# Patient Record
Sex: Male | Born: 1965 | Race: Black or African American | Hispanic: No | Marital: Single | Smoking: Current every day smoker
Health system: Southern US, Community
[De-identification: ages and names within clinical notes are randomized; demographics above are authoritative.]

## PROBLEM LIST (undated history)

## (undated) DIAGNOSIS — K7460 Unspecified cirrhosis of liver: Secondary | ICD-10-CM

## (undated) DIAGNOSIS — R569 Unspecified convulsions: Secondary | ICD-10-CM

## (undated) DIAGNOSIS — R066 Hiccough: Secondary | ICD-10-CM

## (undated) DIAGNOSIS — F1010 Alcohol abuse, uncomplicated: Secondary | ICD-10-CM

## (undated) DIAGNOSIS — T7840XA Allergy, unspecified, initial encounter: Secondary | ICD-10-CM

## (undated) DIAGNOSIS — K7010 Alcoholic hepatitis without ascites: Secondary | ICD-10-CM

## (undated) DIAGNOSIS — E785 Hyperlipidemia, unspecified: Secondary | ICD-10-CM

## (undated) DIAGNOSIS — I10 Essential (primary) hypertension: Secondary | ICD-10-CM

## (undated) HISTORY — DX: Alcohol abuse, uncomplicated: F10.10

## (undated) HISTORY — DX: Hyperlipidemia, unspecified: E78.5

## (undated) HISTORY — PX: NO PAST SURGERIES: SHX2092

## (undated) HISTORY — DX: Allergy, unspecified, initial encounter: T78.40XA

---
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
## 1998-08-28 ENCOUNTER — Emergency Department (HOSPITAL_COMMUNITY): Admission: EM | Admit: 1998-08-28 | Discharge: 1998-08-28 | Payer: Self-pay | Admitting: Emergency Medicine

## 1999-01-27 ENCOUNTER — Emergency Department (HOSPITAL_COMMUNITY): Admission: EM | Admit: 1999-01-27 | Discharge: 1999-01-27 | Payer: Self-pay | Admitting: Emergency Medicine

## 1999-01-27 ENCOUNTER — Encounter: Payer: Self-pay | Admitting: Emergency Medicine

## 1999-04-12 ENCOUNTER — Encounter: Payer: Self-pay | Admitting: Emergency Medicine

## 1999-04-12 ENCOUNTER — Encounter: Payer: Self-pay | Admitting: General Surgery

## 1999-04-12 ENCOUNTER — Inpatient Hospital Stay (HOSPITAL_COMMUNITY): Admission: EM | Admit: 1999-04-12 | Discharge: 1999-04-14 | Payer: Self-pay

## 1999-04-13 ENCOUNTER — Encounter: Payer: Self-pay | Admitting: Neurosurgery

## 1999-04-16 ENCOUNTER — Emergency Department (HOSPITAL_COMMUNITY): Admission: EM | Admit: 1999-04-16 | Discharge: 1999-04-16 | Payer: Self-pay | Admitting: Emergency Medicine

## 1999-04-16 ENCOUNTER — Encounter: Payer: Self-pay | Admitting: Emergency Medicine

## 1999-08-05 ENCOUNTER — Emergency Department (HOSPITAL_COMMUNITY): Admission: EM | Admit: 1999-08-05 | Discharge: 1999-08-05 | Payer: Self-pay | Admitting: *Deleted

## 1999-08-10 ENCOUNTER — Emergency Department (HOSPITAL_COMMUNITY): Admission: EM | Admit: 1999-08-10 | Discharge: 1999-08-10 | Payer: Self-pay | Admitting: Emergency Medicine

## 2000-01-12 ENCOUNTER — Emergency Department (HOSPITAL_COMMUNITY): Admission: EM | Admit: 2000-01-12 | Discharge: 2000-01-12 | Payer: Self-pay | Admitting: Emergency Medicine

## 2001-01-17 ENCOUNTER — Emergency Department (HOSPITAL_COMMUNITY): Admission: EM | Admit: 2001-01-17 | Discharge: 2001-01-17 | Payer: Self-pay | Admitting: *Deleted

## 2002-03-23 ENCOUNTER — Emergency Department (HOSPITAL_COMMUNITY): Admission: EM | Admit: 2002-03-23 | Discharge: 2002-03-23 | Payer: Self-pay | Admitting: Emergency Medicine

## 2002-03-23 ENCOUNTER — Encounter: Payer: Self-pay | Admitting: Emergency Medicine

## 2002-04-09 ENCOUNTER — Encounter: Admission: RE | Admit: 2002-04-09 | Discharge: 2002-04-09 | Payer: Self-pay | Admitting: Family Medicine

## 2002-08-15 ENCOUNTER — Emergency Department (HOSPITAL_COMMUNITY): Admission: EM | Admit: 2002-08-15 | Discharge: 2002-08-15 | Payer: Self-pay | Admitting: Emergency Medicine

## 2003-06-08 ENCOUNTER — Emergency Department (HOSPITAL_COMMUNITY): Admission: EM | Admit: 2003-06-08 | Discharge: 2003-06-08 | Payer: Self-pay | Admitting: Emergency Medicine

## 2003-09-06 ENCOUNTER — Emergency Department (HOSPITAL_COMMUNITY): Admission: EM | Admit: 2003-09-06 | Discharge: 2003-09-06 | Payer: Self-pay | Admitting: Emergency Medicine

## 2004-03-12 ENCOUNTER — Emergency Department (HOSPITAL_COMMUNITY): Admission: EM | Admit: 2004-03-12 | Discharge: 2004-03-12 | Payer: Self-pay | Admitting: Emergency Medicine

## 2004-04-29 ENCOUNTER — Emergency Department (HOSPITAL_COMMUNITY): Admission: EM | Admit: 2004-04-29 | Discharge: 2004-04-29 | Payer: Self-pay | Admitting: Emergency Medicine

## 2005-01-19 ENCOUNTER — Emergency Department (HOSPITAL_COMMUNITY): Admission: EM | Admit: 2005-01-19 | Discharge: 2005-01-19 | Payer: Self-pay | Admitting: Emergency Medicine

## 2005-02-22 ENCOUNTER — Ambulatory Visit: Payer: Self-pay | Admitting: Sports Medicine

## 2005-04-18 ENCOUNTER — Emergency Department (HOSPITAL_COMMUNITY): Admission: EM | Admit: 2005-04-18 | Discharge: 2005-04-18 | Payer: Self-pay | Admitting: Emergency Medicine

## 2005-05-15 ENCOUNTER — Emergency Department (HOSPITAL_COMMUNITY): Admission: EM | Admit: 2005-05-15 | Discharge: 2005-05-15 | Payer: Self-pay | Admitting: *Deleted

## 2005-10-26 ENCOUNTER — Emergency Department (HOSPITAL_COMMUNITY): Admission: EM | Admit: 2005-10-26 | Discharge: 2005-10-27 | Payer: Self-pay | Admitting: Emergency Medicine

## 2006-04-13 DIAGNOSIS — I10 Essential (primary) hypertension: Secondary | ICD-10-CM | POA: Insufficient documentation

## 2006-04-13 DIAGNOSIS — F10239 Alcohol dependence with withdrawal, unspecified: Secondary | ICD-10-CM

## 2006-04-13 DIAGNOSIS — F1020 Alcohol dependence, uncomplicated: Secondary | ICD-10-CM

## 2006-04-13 DIAGNOSIS — R569 Unspecified convulsions: Secondary | ICD-10-CM

## 2007-02-16 ENCOUNTER — Emergency Department (HOSPITAL_COMMUNITY): Admission: EM | Admit: 2007-02-16 | Discharge: 2007-02-16 | Payer: Self-pay | Admitting: Emergency Medicine

## 2008-07-31 ENCOUNTER — Emergency Department (HOSPITAL_COMMUNITY): Admission: EM | Admit: 2008-07-31 | Discharge: 2008-07-31 | Payer: Self-pay | Admitting: Emergency Medicine

## 2010-01-01 ENCOUNTER — Emergency Department (HOSPITAL_COMMUNITY): Admission: EM | Admit: 2010-01-01 | Discharge: 2010-01-01 | Payer: Self-pay | Admitting: Emergency Medicine

## 2010-03-07 ENCOUNTER — Encounter: Payer: Self-pay | Admitting: Emergency Medicine

## 2010-04-27 LAB — URINALYSIS, ROUTINE W REFLEX MICROSCOPIC
Glucose, UA: 100 mg/dL — AB
Nitrite: NEGATIVE
Urobilinogen, UA: 1 mg/dL (ref 0.0–1.0)

## 2010-04-27 LAB — CBC
HCT: 40.7 % (ref 39.0–52.0)
Hemoglobin: 14.5 g/dL (ref 13.0–17.0)
MCH: 33.4 pg (ref 26.0–34.0)
MCHC: 35.7 g/dL (ref 30.0–36.0)
MCV: 93.7 fL (ref 78.0–100.0)
Platelets: 240 10*3/uL (ref 150–400)
RBC: 4.35 MIL/uL (ref 4.22–5.81)
WBC: 6.7 10*3/uL (ref 4.0–10.5)

## 2010-04-27 LAB — COMPREHENSIVE METABOLIC PANEL
Albumin: 4.3 g/dL (ref 3.5–5.2)
BUN: 7 mg/dL (ref 6–23)
Creatinine, Ser: 0.93 mg/dL (ref 0.4–1.5)
GFR calc non Af Amer: >60 mL/min (ref >60)
Total Bilirubin: 0.8 mg/dL (ref 0.3–1.2)

## 2010-04-27 LAB — RAPID URINE DRUG SCREEN, HOSP PERFORMED
Barbiturates: NOT DETECTED
Benzodiazepines: NOT DETECTED
Tetrahydrocannabinol: NOT DETECTED

## 2010-04-27 LAB — DIFFERENTIAL
Eosinophils Absolute: 0 10*3/uL (ref 0.0–0.7)
Eosinophils Relative: 0 % (ref 0–5)
Monocytes Relative: 7 % (ref 3–12)
Neutrophils Relative %: 80 % — ABNORMAL HIGH (ref 43–77)

## 2010-04-27 LAB — URINE CULTURE
Colony Count: 15000
Culture  Setup Time: 201111190123

## 2010-04-27 LAB — LIPASE, BLOOD: Lipase: 23 U/L (ref 11–59)

## 2010-05-14 ENCOUNTER — Emergency Department (HOSPITAL_COMMUNITY): Payer: Self-pay

## 2010-05-14 ENCOUNTER — Inpatient Hospital Stay (HOSPITAL_COMMUNITY): Admission: AD | Admit: 2010-05-14 | Payer: Self-pay | Source: Ambulatory Visit | Admitting: Cardiovascular Disease

## 2010-05-14 ENCOUNTER — Observation Stay (HOSPITAL_COMMUNITY)
Admission: EM | Admit: 2010-05-14 | Discharge: 2010-05-15 | Disposition: A | Discharge status: Home / Self Care | Payer: Self-pay | Attending: Family Medicine | Admitting: Family Medicine

## 2010-05-14 DIAGNOSIS — F1010 Alcohol abuse, uncomplicated: Secondary | ICD-10-CM

## 2010-05-14 DIAGNOSIS — R079 Chest pain, unspecified: Secondary | ICD-10-CM

## 2010-05-14 DIAGNOSIS — R7402 Elevation of levels of lactic acid dehydrogenase (LDH): Secondary | ICD-10-CM | POA: Insufficient documentation

## 2010-05-14 DIAGNOSIS — E785 Hyperlipidemia, unspecified: Secondary | ICD-10-CM | POA: Insufficient documentation

## 2010-05-14 DIAGNOSIS — R7401 Elevation of levels of liver transaminase levels: Secondary | ICD-10-CM | POA: Insufficient documentation

## 2010-05-14 DIAGNOSIS — R0789 Other chest pain: Secondary | ICD-10-CM

## 2010-05-14 DIAGNOSIS — R631 Polydipsia: Secondary | ICD-10-CM | POA: Insufficient documentation

## 2010-05-14 DIAGNOSIS — F17200 Nicotine dependence, unspecified, uncomplicated: Secondary | ICD-10-CM | POA: Insufficient documentation

## 2010-05-14 DIAGNOSIS — G40909 Epilepsy, unspecified, not intractable, without status epilepticus: Principal | ICD-10-CM | POA: Insufficient documentation

## 2010-05-14 DIAGNOSIS — R569 Unspecified convulsions: Secondary | ICD-10-CM

## 2010-05-14 DIAGNOSIS — R3589 Other polyuria: Secondary | ICD-10-CM | POA: Insufficient documentation

## 2010-05-14 DIAGNOSIS — R358 Other polyuria: Secondary | ICD-10-CM | POA: Insufficient documentation

## 2010-05-14 LAB — URINE MICROSCOPIC-ADD ON

## 2010-05-14 LAB — URINALYSIS, ROUTINE W REFLEX MICROSCOPIC
Nitrite: NEGATIVE
Specific Gravity, Urine: 1.017 (ref 1.005–1.030)
Urobilinogen, UA: 1 mg/dL (ref 0.0–1.0)

## 2010-05-14 LAB — DIFFERENTIAL
Basophils Absolute: 0 10*3/uL (ref 0.0–0.1)
Basophils Relative: 0 % (ref 0–1)
Eosinophils Relative: 0 % (ref 0–5)
Monocytes Absolute: 0.5 10*3/uL (ref 0.1–1.0)
Neutro Abs: 5 10*3/uL (ref 1.7–7.7)

## 2010-05-14 LAB — RAPID URINE DRUG SCREEN, HOSP PERFORMED
Benzodiazepines: NOT DETECTED
Cocaine: NOT DETECTED
Opiates: NOT DETECTED
Tetrahydrocannabinol: NOT DETECTED

## 2010-05-14 LAB — CBC
Hemoglobin: 12.7 g/dL — ABNORMAL LOW (ref 13.0–17.0)
MCHC: 36 g/dL (ref 30.0–36.0)
RDW: 13.8 % (ref 11.5–15.5)

## 2010-05-14 LAB — COMPREHENSIVE METABOLIC PANEL
ALT: 60 U/L — ABNORMAL HIGH (ref 0–53)
AST: 134 U/L — ABNORMAL HIGH (ref 0–37)
Albumin: 4.2 g/dL (ref 3.5–5.2)
Calcium: 9.4 mg/dL (ref 8.4–10.5)
GFR calc Af Amer: >60 mL/min (ref >60)
Sodium: 133 mEq/L — ABNORMAL LOW (ref 135–145)
Total Protein: 7.4 g/dL (ref 6.0–8.3)

## 2010-05-14 LAB — POCT I-STAT, CHEM 8
Creatinine, Ser: 1.5 mg/dL (ref 0.4–1.5)
Hemoglobin: 13.6 g/dL (ref 13.0–17.0)
Sodium: 131 mEq/L — ABNORMAL LOW (ref 135–145)
TCO2: 24 mmol/L (ref 0–100)

## 2010-05-14 LAB — CARDIAC PANEL(CRET KIN+CKTOT+MB+TROPI): Troponin I: 0.06 ng/mL (ref 0.00–0.06)

## 2010-05-14 LAB — TROPONIN I: Troponin I: 0.05 ng/mL (ref 0.00–0.06)

## 2010-05-14 LAB — CK TOTAL AND CKMB (NOT AT ARMC): Relative Index: 0.6 (ref 0.0–2.5)

## 2010-05-15 DIAGNOSIS — R072 Precordial pain: Secondary | ICD-10-CM

## 2010-05-15 LAB — CBC
HCT: 33.3 % — ABNORMAL LOW (ref 39.0–52.0)
Hemoglobin: 11.8 g/dL — ABNORMAL LOW (ref 13.0–17.0)
MCV: 92.8 fL (ref 78.0–100.0)
RDW: 14.1 % (ref 11.5–15.5)
WBC: 8.2 10*3/uL (ref 4.0–10.5)

## 2010-05-15 LAB — HEPATITIS PANEL, ACUTE
HCV Ab: NEGATIVE
Hepatitis B Surface Ag: NEGATIVE

## 2010-05-15 LAB — COMPREHENSIVE METABOLIC PANEL
ALT: 49 U/L (ref 0–53)
Alkaline Phosphatase: 58 U/L (ref 39–117)
BUN: 6 mg/dL (ref 6–23)
CO2: 27 mEq/L (ref 19–32)
Chloride: 98 mEq/L (ref 96–112)
GFR calc non Af Amer: >60 mL/min (ref >60)
Glucose, Bld: 85 mg/dL (ref 70–99)
Potassium: 3.2 mEq/L — ABNORMAL LOW (ref 3.5–5.1)
Total Bilirubin: 1 mg/dL (ref 0.3–1.2)
Total Protein: 6.7 g/dL (ref 6.0–8.3)

## 2010-05-15 LAB — CARDIAC PANEL(CRET KIN+CKTOT+MB+TROPI)
CK, MB: 2.4 ng/mL (ref 0.3–4.0)
Relative Index: 0.4 (ref 0.0–2.5)
Total CK: 631 U/L — ABNORMAL HIGH (ref 7–232)

## 2010-05-15 LAB — FOLATE: Folate: >20 ng/mL

## 2010-05-15 LAB — FERRITIN: Ferritin: 531 ng/mL — ABNORMAL HIGH (ref 22–322)

## 2010-05-15 LAB — LIPID PANEL
Cholesterol: 264 mg/dL — ABNORMAL HIGH (ref 0–200)
LDL Cholesterol: 151 mg/dL — ABNORMAL HIGH (ref 0–99)

## 2010-05-15 LAB — IRON AND TIBC
Iron: 82 ug/dL (ref 42–135)
TIBC: 373 ug/dL (ref 215–435)

## 2010-05-15 LAB — AMMONIA: Ammonia: 31 umol/L (ref 11–35)

## 2010-05-15 LAB — VITAMIN B12: Vitamin B-12: 286 pg/mL (ref 211–911)

## 2010-05-19 ENCOUNTER — Ambulatory Visit (INDEPENDENT_AMBULATORY_CARE_PROVIDER_SITE_OTHER): Payer: Self-pay | Admitting: Family Medicine

## 2010-05-19 ENCOUNTER — Encounter: Payer: Self-pay | Admitting: Family Medicine

## 2010-05-19 VITALS — BP 114/64 | HR 102 | Temp 98.4°F | Ht 64.0 in | Wt 112.0 lb

## 2010-05-19 DIAGNOSIS — F1020 Alcohol dependence, uncomplicated: Secondary | ICD-10-CM

## 2010-05-19 DIAGNOSIS — K7010 Alcoholic hepatitis without ascites: Secondary | ICD-10-CM | POA: Insufficient documentation

## 2010-05-19 DIAGNOSIS — R569 Unspecified convulsions: Secondary | ICD-10-CM

## 2010-05-19 NOTE — Patient Instructions (Addendum)
I will get you set-up for an ultrasound- Echo of the heart- please call when you get the orange card You do not have diabetes- your A1C 5.2% Your thyroid was normal You do not have Hepatitis Continue the Keppra 1 tablet twice a day for seizure Continue the multivitamin  Please look into AA in your community Follow up 1 month for liver recheck and follow-up seizures

## 2010-05-19 NOTE — Progress Notes (Signed)
  Subjective:    Patient ID: Joseph Underwood, male    DOB: Jun 23, 1965, 45 y.o.   MRN: [id number]  HPI  Here for hospital f/u, admitted for 24 hours for seizure and chest pain, had not been on seizure medication > 2 years, EKG changes prompted cardiology to be called in ED, recommended an ECHO and 24 hour rule-out  3 days a week , he drinks 1.5 pints of liquor , has been drinking this heavy  > 20 years , has been in outpatient and inpatient rehab, longest time without ETOH for 6months  No seizure activity since discharge, taking Keppra No chest pain  Works full time , walks everywhere, does not drive  W0J- 5.2 TSH-normal Hep Panel-neg Echo- not done Anemia panel-normal   Review of Systems       Objective:   Physical Exam    GEN- NAD,pleasant    CVS- RR, HR 100, no murmur    RESP- CTAB    Assessment & Plan:

## 2010-05-19 NOTE — Assessment & Plan Note (Signed)
Continue Keppra, avoid hepatoxic meds

## 2010-05-19 NOTE — Assessment & Plan Note (Signed)
Discussed etoh cessation, pt contemplating, has been in rehab, has had AA sponsor before He does appear interested in quitting Given handout for [facility]

## 2010-05-19 NOTE — Assessment & Plan Note (Signed)
Elevated LFT, in setting of ETOH abuse, neg hep panel Explained importance of alcohol cessation Follow LFT Avoid nephrotoxic agents

## 2010-05-31 NOTE — Consult Note (Signed)
NAME:  Joseph Underwood, Joseph Underwood                 ACCOUNT NO.:  [id number]  MEDICAL RECORD NO.:  [id number]           PATIENT TYPE:  O  LOCATION:  2506                         FACILITY:  [facility]  PHYSICIAN:  Marca Ancona, MD      DATE OF BIRTH:  03-Sep-1965  DATE OF CONSULTATION:  05/14/2010 DATE OF DISCHARGE:                                CONSULTATION   PRIMARY CARDIOLOGIST:  New patient to [facility].  PRIMARY CARE PROVIDER:  None.  REASON FOR CONSULTATION:  Chest pain and possible EKG changes.  HISTORY OF PRESENT ILLNESS:  This is a 45 year old African American gentleman without known coronary artery disease but a history of seizures.  He states that he has not been taking his Dilantin and has multiple seizure episodes, approximately 2 times a year.  His last seizure  was 3 months ago. He states he has been in his usual state of health until  today.  At this time, he was at work today and had a tonic-clonic seizure.   He states it lasted approximately 30 minutes.  He did state he had chest  pain in the postictal  state.  The patient states this is typical for him.   Chest pain had resolved prior to admission in the emergency room.  EKG was  obtained by EMS first upon arrival at the patient's work.  EKG showed 1-mm ST elevation in V1 through V4.  A code STEMI was activated.    The patient was brought emergently to the cath lab.  Upon further review of the EKG by Dr. Clifton James noted that this is unchanged from prior tracing in 2004. EKG appears to show early repolarization abnormalities and not consistent with a ST-elevation myocardial infarction.  Dr. Clifton James noted that the patient had minimal chest pain upon evaluation in the cath lab but a code STEMI was cancelled and there was no need for urgent catheterization.  The patient was brought back down to the emergency department and has been placed on Dilantin.  He is chest pain-free at this time.  He denies any nausea, vomiting, or  diaphoresis.  The patient denies any recent fever or chills.  He does complain of some dyspepsia. His troponin is currently negative with mild elevation in CPK, this is expected with recent seizures.  Again, EKG shows ST elevations in leads V1 through V4 that were consistent with early repolarization. Cardiology was consulted for further evaluation.  PAST MEDICAL HISTORY: 1. Seizure. 2. History of subdural hematoma in 2001.  No surgical history.  SOCIAL HISTORY:  The patient lives in [place] with his mom.  He works at [employer].  He has 4 children.  He is single.  He smokes approximately 10 cigarettes a day and has done this for 15 years.  He drinks alcohol approximately 3 times a week and states he drinks about one and a half pints per week.  He denies any illicit drug use.  FAMILY HISTORY:  His mother is alive and with a history of tetralogy of Fallot, she also has high blood pressure and diabetes mellitus.  His father passed away  at age 53 from myocardial infarction.  He has a brother with seizure disorder.  He also has 2 sisters, one with diabetes mellitus.  ALLERGIES:  No known drug allergies.  HOME MEDICATIONS:  None.  REVIEW OF SYSTEMS:  All pertinent positives and negatives as stated in HPI.  All other systems have been reviewed and are negative.  PHYSICAL EXAMINATION:  VITAL SIGNS:  Temperature 99, pulse 91-121, respirations 18, blood pressure 129/84, and O2 saturation 97% on 2 liters. GENERAL:  This is a well-developed, well-nourished, middle-aged gentleman.  He is in no acute distress. HEENT:  Normocephalic with icteric sclerae and mild nystagmus. NECK:  Supple without bruit or JVD. HEART:  Regular rate and rhythm with S1 and S2.  There is a positive S4. No murmurs, rubs, or gallops noted.  Pulses are 2+ and equal bilaterally. LUNGS:  Clear to auscultation bilaterally without wheezes, rales, or rhonchi. ABDOMEN:  Right upper quadrant is mildly  tender to palpation.  Abdomen is soft.  Positive bowel sounds x4. EXTREMITIES:  No clubbing, cyanosis, or edema. MUSCULOSKELETAL:  No joint deformities or effusions. NEUROLOGIC:  Alert and oriented x3.  Cranial nerves II through XII grossly intact.  Chest x-ray showed no acute cardiopulmonary disease process.  EKG showing sinus rhythm at a rate of 99 beats per minute.  There are ST elevation in leads V1 through V3 consistent with early repolarization.  LABORATORY DATA:  WBC is 6.2, hemoglobin 13.6, hematocrit 40, and platelet 151.  Sodium 131, potassium 3.5, BUN 14, creatinine 1.5. Creatinine kinase 358, CK-MB 2.1, and troponin 0.05.  Total bilirubin 1.3, alkaline phosphatase 66, AST 134, ALT 60, total protein 7.4, albumin 4.2, and calcium 9.4.  Alcohol less than 5.  ASSESSMENT AND PLAN:  This is a 45 year old African American gentleman with history of a seizure disorder who is currently not on antiseizure medications who had a tonic-clonic seizure today with chest pain after his seizure.  The patient had chest pain after his seizures, he states this was not abnormal.  Currently, his troponin is negative with mildly elevated CPK which is expected with seizures.  He has ST-segment elevation on his EKG which is consistent with early repolarization. 1. Chest pain.  Suspect, it is noncardiac and related to his seizure.     We will cycle cardiac enzymes.  We will also check a 2-D     echocardiogram for left ventricular function.  We will get a repeat     EKG in the morning again, current EKG appears to be consistent with     early repolarization. 2. 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  Further workup will be per primary team. 3. Seizure disorder:  The patient has been started on Dilantin and     further recommendations will be per primary service.  Thank you for this consultation.  We will continue to follow along  with you.     Leonette Monarch, PA-C   ______________________________ Marca Ancona, MD    NB/MEDQ  D:  05/14/2010  T:  05/15/2010  Job:  [id number]  Electronically Signed by Alen Blew P.A. on 05/24/2010 03:56:26 PM Electronically Signed by Marca Ancona MD on 05/31/2010 09:03:57 AM

## 2010-06-01 NOTE — H&P (Signed)
NAME:  [place], Natale                 ACCOUNT NO.:  [id number]  MEDICAL RECORD NO.:  [id number]           PATIENT TYPE:  O  LOCATION:  2506                         FACILITY:  [facility]  PHYSICIAN:  Pearlean Brownie, M.D.DATE OF BIRTH:  1965-09-11  DATE OF ADMISSION:  05/14/2010 DATE OF DISCHARGE:                             HISTORY & PHYSICAL   PRIMARY CARE PHYSICIAN:  No primary care physician.  CHIEF COMPLAINT:  Seizure and chest pain.  HISTORY OF PRESENT ILLNESS:  The patient is a 45 year old male with history of seizure disorder and alcohol abuse.  The patient presented today after experiencing a seizure while at work.  He states one minute he felt fine and next he was in the ambulance.  He was postictal after arriving to the ED which lasted approximately 1 hour.  He states that is typical course when has had a seizure in the past.  His last seizure was approximately 3 months ago.  He is supposed to take Dilantin for seizure control, however, has not taken it quite some time.  He has no aura or preceding symptoms prior to seizure.  He denies any recent illness, new medicines, lifestyle changes that may have precipitated this seizure. He has never seen a neurologist for his seizure disorder and has not seen a PCP for greater than 5 years.  Also today after his seizure, he experienced chest pain.  Pain was left sided substernal, described as light pain.  Pain did not radiate.  Pain went away once he got to the ED.  He received no medicine for his chest pain in the ED.  ED COURSE:  He received 1 g Dilantin IV push and had EKG done which showed ST elevations.  Cardiology has seen the patient while he was in the ED.  PAST MEDICAL HISTORY:  Seizure disorder, seizure started in 2001.  PAST SURGICAL HISTORY:  None.  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  He smokes half pack of cigarettes and has smoked for 15 years.  Drinks half pint of liquor daily and  drinks daily for 15 years.  FAMILY HISTORY:  Mother with history of , hypertension, and diabetes.  REVIEW OF SYSTEMS:  Denies fever, chills, change in appetite, weight change, headache, cough, dyspnea, nausea, vomiting, abdominal pain, myalgias, visual changes, weakness, or numbness.  He does endorse polyuria and polydipsia.  ALLERGIES:  No known drug allergies.  MEDICATIONS:  No medications.  PHYSICAL EXAMINATION:  VITAL SIGNS:  Temperature 99, pulse 121, respirations 18, blood pressure 129/84, and SPO2 of 97% on 2 liters. GENERAL:  No acute distress. HEENT:  Normocephalic and atraumatic.  Pupils equal, round, and reactive to light.  Extraocular movements are intact.  Sclerae are anicteric. Moist mucous membranes.  Exostosis present under tongue. NECK:  Supple without adenopathy. CARDIOVASCULAR:  Tachycardic with no murmur, rub, or gallop. LUNGS:  Clear to auscultation bilaterally.  No crackles or wheezes. ABDOMEN:  Soft, nontender, and nondistended.  Bowel sounds positive.  No hepatomegaly. EXTREMITIES:  No cyanosis, clubbing, or edema.  Peripheral pulse pulses 2+ throughout. NEUROLOGIC:  Alert and oriented,  however, anxious appearing.  Cranial nerves II through XII intact.  Strength and sensation are good and symmetrical.  DTRs 1+.  Mild tremor and asterixis present. MUSCULOSKELETAL:  No tenderness to palpation along muscles.  LABORATORY DATA AND STUDIES:  Sodium 133, potassium 3.5, chloride 96, CO2 of 24, BUN of 12, creatinine 1.32, and glucose 112.  AST 134, ALT 60, total protein 7.4, albumin 4.2, total bili 1.3, and alk phos 6.6. CBC with WBC of 6.2, hemoglobin 12.7, hematocrit 35.3, and platelets 151.  CK was 358, CK-MB 2.1, and troponin 0.05.  UDS was negative.  UA was negative.  IMAGING:  He had chest x-ray which showed no active lung disease.  He had an EKG, ST elevations in anterior leads and LVH.  ASSESSMENT AND PLAN:  This is a 45 year old male with history of  seizure disorder here after seizure with chest pain. 1. Seizure disorder.  He had seizure today.  No deficits on exam from     seizure.  Received Dilantin loading dose in ED.  However, in light     of hepatic insufficiency, we will change to Keppra.  Unsure     etiology whether this is from being off Dilantin or possibly     alcohol related.  We will admit to telemetry and monitor overnight.     We will use Ativan if needed for acute seizure control. 2. Alcohol abuse.  He has had withdrawal symptoms when stopped alcohol     in the past.  We will place on CIWA protocol during hospitalization     and monitor for signs of withdrawal. 3. Polyuria/polydipsia.  Concerning for possible diabetes, especially     with family history.  We will check A1c and follow glucose. 4. Chest pain.  EKG with ST elevations in anterior leads in EKG.     Cardiology has seen the patient in the ED, did not feel like this     was ST-elevated myocardial infarction at this time.  We will cycle     cardiac enzymes x3 and get echo tomorrow.  Risks stratify with     hemoglobin A1c, fasting lipids, and TSH. 5. Elevated CK.  Likely secondary to seizure.  No muscle tenderness or     pain.  We will give IV fluids at a rate of 100 mL/hour. 6. Fluid, electrolyte, nutrition/gastrointestinal.  Regular diet,     normal saline at 100 mL an hour. 7. Prophylaxis.  Heparin 5000 units subcu t.i.d. 8. Disposition.  Pending clinical workup and chest pain and seizure-     free. 9. Code status.  Full code.   ______________________________ Everrett Coombe, MD   ______________________________ Pearlean Brownie, M.D.   CM/MEDQ  D:  05/14/2010  T:  05/15/2010  Job:  [id number]  Electronically Signed by Everrett Coombe MD on 05/18/2010 09:28:41 PM Electronically Signed by Pearlean Brownie M.D. on 06/01/2010 02:21:21 PM

## 2010-06-01 NOTE — Discharge Summary (Signed)
NAME:  Joseph Underwood, Joseph Underwood                 ACCOUNT NO.:  [id number]  MEDICAL RECORD NO.:  [id number]           PATIENT TYPE:  O  LOCATION:  2506                         FACILITY:  [facility]  PHYSICIAN:  Milinda Antis, MD     DATE OF BIRTH:  11-21-1965  DATE OF ADMISSION:  05/14/2010 DATE OF DISCHARGE:  05/15/2010                              DISCHARGE SUMMARY   DISCHARGE DIAGNOSES: 1. Seizure disorder. 2. Atypical chest pain. 3. Transaminitis secondary to alcohol abuse. 4. Alcohol abuse. 5. Prior tobacco abuse. 6. Hyperlipidemia.  DISCHARGE MEDICATIONS: 1. Keppra 500 mg p.o. b.i.d. 2. Multivitamin 1 tablet p.o. daily.  CONSULTS:  Cardiology secondary to initial EKG findings concerning for ST elevations, code STEMI was called, however, this was discontinued after this was thought to be secondary to repolarization.  The patient was not followed by Cardiology throughout hospitalization.  DISCHARGE LABS:  Fasting lipid panel; total cholesterol 264, HDL 92, LDL 151, triglycerides 106, creatinine 0.70, BUN 6.  LFTs; T-bili 1.0, albumin 3.8, alk phos 58, AST 102, ALT 49, anemia panel normal, ammonia 31.  Cardiac enzymes, troponins negative x3.  CK level 6, 41 at discharge.  ADMISSION LABS:  CBC; hemoglobin 12.7, hematocrit 35.3, BUN 12, creatinine 1.31.  LFT; T-bili 1.3, alk phos 66, AST 134, ALT 60.  CK 358.  IMAGING: 1. Chest x-ray, no active disease.  No cardiomegaly. 2. EKG on admission, sinus tachycardia with ST elevation in V1-V4.     EKG at discharge; normal sinus rhythm, LVH, no ST changes.  BRIEF HOSPITAL COURSE:  A 45 year old male admitted status post witnessed tonic-clonic seizure activity at work.  Upon resolution of seizure, the patient had substernal chest pain, was thought to be atypical in nature, however, had abnormal EKG and code STEMI was called but then disbanded secondary to re-read of EKG showing repolarization. 1. Seizure.  The patient with history of seizure  disorder diagnosed     approximately 10 years ago.  It is unclear why he had a seizure     event yesterday afternoon with the exception of not being on his     antiepileptic medications.  The patient had not been on Dilantin     for greater than 2 years.  There was no signs of infection as cause     of the patient's seizure activity and there were no arrhythmias     noted.  Cardiac workup was completed secondary to initial EKG     concerns, however, this was found to be negative.  As a possibility     that the patient's alcohol abuse could have been contributing to     his seizure, however he did not show evidence of complete withdrawel during admission.     The patient was loaded with Dilantin 1 g in the ED.  However when     labs showed transaminitis, decision was made to switch to Keppra     which is not hepatotoxic.  The patient will continue Keppra 500     mg p.o. b.i.d. and follow up at the [facility].  If the  patient seizures are not controlled, Keppra needs to be titrated     up. 2. Chest pain, atypical chest pain, likely musculoskeletal,secondary to  seizure activity especially with slightly increased CK. Though not concerning for Rhabdomyalysis as  Renal function also remained normal.  The patient was risk stratified.  A1c and TSH     were pending at discharge.  This will be followed in the clinic.     Fasting lipid panel was obtained which showed some elevation in his     LDL and total cholesterol, however we will not start medications at     this time as the patient has transaminitis and statins are hepatic     toxic.  We will follow up in clinic in 6 months with a repeat     fasting lipid panel. Note cardiology recommended 2 D echo, this will be performed as an outpatient if not able to be obtained today. 3. Transaminitis.  The patient's LFTs were elevated at a ratio 2:1     concerning for alcohol abuse.  Hepatitis panel was obtained,     however this was  pending at discharge as this would not change     management.  The patient will be discharged before this is     available.  This will be followed up by his primary care doctor.     Of note, ammonia levels were obtained which were negative.  LFTs     improved prior to admission, then only his AST was in an abnormal     range. 4. Alcohol abuse.  The patient gives history of alcohol abuse.  At the     bedside, we discussed multiple times the importance of him quitting     it, he at this point decided that he wants to stop drinking     alcohol.  This will be followed up in the clinic.  He has been     roughly 48 hours without any alcohol and is at risk for delirium     tremens, though not overtly showing signs at time of discharge.  He was only given 1 dose of Ativan during the admission.     He will be continued on multivitamins. 5. Tobacco abuse.  The patient is not ready to quit.  He did not have     a nicotine patch during the admission.  ISSUES FOR FOLLOWUP: 1. Follow up labs; hepatitis panel, hemoglobin A1c, TSH.  Follow up     seizure activity. 2. Repeat fasting lipid panel in 6 months and decision to be made     regarding therapy. 3. 2 D Echo FOLLOWUP APPOINTMENT:  The patient will call for followup appointment at [facility] with either Dr. Everrett Coombe or Dr. Kingsley Spittle, [facility].  The phone number is [phone] and he will be given this information at discharge.  DISCHARGE CONDITION:  Stable/improved.  DISCHARGE LOCATION:  Home.  DISCHARGE DIET:  Heart-healthy.     Milinda Antis, MD     KD/MEDQ  D:  05/15/2010  T:  05/16/2010  Job:  [id number]  cc:   Everrett Coombe, MD  Electronically Signed by Milinda Antis MD on 05/17/2010 04:13:31 PM Electronically Signed by Pearlean Brownie M.D. on 06/01/2010 02:21:06 PM

## 2010-06-17 ENCOUNTER — Ambulatory Visit (INDEPENDENT_AMBULATORY_CARE_PROVIDER_SITE_OTHER): Payer: Self-pay | Admitting: Family Medicine

## 2010-06-17 NOTE — Progress Notes (Signed)
  Subjective:    Patient ID: Joseph Underwood, male    DOB: 01/12/1966, 45 y.o.   MRN: [id number]  HPI    Review of Systems     Objective:   Physical Exam        Assessment & Plan:  Pt left after signing in. Was not triaged or seen

## 2010-07-02 NOTE — Procedures (Signed)
[facility]  Patient:    [place], Dawson E Visit Number: [id number] MRN: [id number]          Service Type: EMS Location: ED Attending Physician:  Rosalyn Charters Dictated by:   Kari Baars, M.D. Proc. Date: 01/18/01 Admit Date:  01/17/2001 Discharge Date: 01/17/2001                            EKG Interpretations  TIME:  2206, January 17, 2001.  The rhythm is sinus tachycardia with rate of about 115.  The axis is rightward but does not meet criteria for right axis deviation.  Right atrial enlargement is seen and probably left atrial enlargement as well.  There is LVH by voltage criteria.  There are Q waves in V1 and V2.  These may be due to previous anteroseptal myocardial infarction or due to the LVH.  There are diffuse nonspecific T wave abnormalities.  OVERALL: Abnormal electrocardiogram. Dictated by:   Kari Baars, M.D. Attending Physician:  Rosalyn Charters DD:  01/18/01 TD:  01/18/01 Job: [id number]
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
## 2010-07-09 ENCOUNTER — Emergency Department (HOSPITAL_COMMUNITY)
Admission: EM | Admit: 2010-07-09 | Discharge: 2010-07-09 | Disposition: A | Discharge status: Home / Self Care | Payer: Self-pay | Attending: Emergency Medicine | Admitting: Emergency Medicine

## 2010-07-09 DIAGNOSIS — I498 Other specified cardiac arrhythmias: Secondary | ICD-10-CM | POA: Insufficient documentation

## 2010-07-09 DIAGNOSIS — I10 Essential (primary) hypertension: Secondary | ICD-10-CM | POA: Insufficient documentation

## 2010-07-09 DIAGNOSIS — G40909 Epilepsy, unspecified, not intractable, without status epilepticus: Secondary | ICD-10-CM | POA: Insufficient documentation

## 2010-07-09 LAB — DIFFERENTIAL
Basophils Absolute: 0 10*3/uL (ref 0.0–0.1)
Eosinophils Relative: 0 % (ref 0–5)
Lymphocytes Relative: 8 % — ABNORMAL LOW (ref 12–46)
Neutro Abs: 6.5 10*3/uL (ref 1.7–7.7)
Neutrophils Relative %: 85 % — ABNORMAL HIGH (ref 43–77)

## 2010-07-09 LAB — CBC
HCT: 41 % (ref 39.0–52.0)
RBC: 4.37 MIL/uL (ref 4.22–5.81)
RDW: 12.9 % (ref 11.5–15.5)
WBC: 7.6 10*3/uL (ref 4.0–10.5)

## 2010-07-09 LAB — COMPREHENSIVE METABOLIC PANEL
ALT: 20 U/L (ref 0–53)
BUN: 13 mg/dL (ref 6–23)
Calcium: 8.5 mg/dL (ref 8.4–10.5)
Glucose, Bld: 122 mg/dL — ABNORMAL HIGH (ref 70–99)
Sodium: 135 mEq/L (ref 135–145)
Total Protein: 7.6 g/dL (ref 6.0–8.3)

## 2010-07-09 LAB — CK TOTAL AND CKMB (NOT AT ARMC)
CK, MB: 2.6 ng/mL (ref 0.3–4.0)
Relative Index: 1 (ref 0.0–2.5)

## 2010-07-09 LAB — ETHANOL: Alcohol, Ethyl (B): <11 mg/dL — ABNORMAL HIGH (ref 0–10)

## 2010-07-09 LAB — PHENYTOIN LEVEL, TOTAL: Phenytoin Lvl: <2.5 ug/mL — ABNORMAL LOW (ref 10.0–20.0)

## 2010-10-09 ENCOUNTER — Emergency Department (HOSPITAL_COMMUNITY): Payer: Worker's Compensation

## 2010-10-09 ENCOUNTER — Emergency Department (HOSPITAL_COMMUNITY)
Admission: EM | Admit: 2010-10-09 | Discharge: 2010-10-09 | Disposition: A | Discharge status: Home / Self Care | Payer: Worker's Compensation | Attending: Emergency Medicine | Admitting: Emergency Medicine

## 2010-10-09 DIAGNOSIS — H1130 Conjunctival hemorrhage, unspecified eye: Secondary | ICD-10-CM | POA: Insufficient documentation

## 2010-10-09 DIAGNOSIS — Z79899 Other long term (current) drug therapy: Secondary | ICD-10-CM | POA: Insufficient documentation

## 2010-10-09 DIAGNOSIS — I10 Essential (primary) hypertension: Secondary | ICD-10-CM | POA: Insufficient documentation

## 2010-10-09 DIAGNOSIS — H5789 Other specified disorders of eye and adnexa: Secondary | ICD-10-CM | POA: Insufficient documentation

## 2010-10-09 DIAGNOSIS — R220 Localized swelling, mass and lump, head: Secondary | ICD-10-CM | POA: Insufficient documentation

## 2010-10-09 DIAGNOSIS — G40909 Epilepsy, unspecified, not intractable, without status epilepticus: Secondary | ICD-10-CM | POA: Insufficient documentation

## 2010-10-09 DIAGNOSIS — R51 Headache: Secondary | ICD-10-CM | POA: Insufficient documentation

## 2010-10-09 DIAGNOSIS — S0180XA Unspecified open wound of other part of head, initial encounter: Secondary | ICD-10-CM | POA: Insufficient documentation

## 2010-10-09 DIAGNOSIS — R404 Transient alteration of awareness: Secondary | ICD-10-CM | POA: Insufficient documentation

## 2010-10-09 DIAGNOSIS — IMO0002 Reserved for concepts with insufficient information to code with codable children: Secondary | ICD-10-CM | POA: Insufficient documentation

## 2010-10-09 DIAGNOSIS — R7309 Other abnormal glucose: Secondary | ICD-10-CM | POA: Insufficient documentation

## 2010-10-09 DIAGNOSIS — S058X9A Other injuries of unspecified eye and orbit, initial encounter: Secondary | ICD-10-CM | POA: Insufficient documentation

## 2010-10-09 DIAGNOSIS — S01501A Unspecified open wound of lip, initial encounter: Secondary | ICD-10-CM | POA: Insufficient documentation

## 2010-10-09 DIAGNOSIS — S0003XA Contusion of scalp, initial encounter: Secondary | ICD-10-CM | POA: Insufficient documentation

## 2010-10-09 DIAGNOSIS — Y9289 Other specified places as the place of occurrence of the external cause: Secondary | ICD-10-CM | POA: Insufficient documentation

## 2010-10-09 DIAGNOSIS — S0083XA Contusion of other part of head, initial encounter: Secondary | ICD-10-CM | POA: Insufficient documentation

## 2010-10-09 DIAGNOSIS — W19XXXA Unspecified fall, initial encounter: Secondary | ICD-10-CM | POA: Insufficient documentation

## 2010-10-09 LAB — BASIC METABOLIC PANEL
CO2: 28 mEq/L (ref 19–32)
Glucose, Bld: 240 mg/dL — ABNORMAL HIGH (ref 70–99)
Potassium: 3.7 mEq/L (ref 3.5–5.1)
Sodium: 131 mEq/L — ABNORMAL LOW (ref 135–145)

## 2010-10-09 LAB — RAPID URINE DRUG SCREEN, HOSP PERFORMED
Amphetamines: NOT DETECTED
Cocaine: NOT DETECTED
Opiates: POSITIVE — AB
Tetrahydrocannabinol: NOT DETECTED

## 2010-10-09 LAB — DIFFERENTIAL
Basophils Absolute: 0 10*3/uL (ref 0.0–0.1)
Lymphocytes Relative: 9 % — ABNORMAL LOW (ref 12–46)
Neutro Abs: 8.7 10*3/uL — ABNORMAL HIGH (ref 1.7–7.7)

## 2010-10-09 LAB — CBC
HCT: 39.3 % (ref 39.0–52.0)
Hemoglobin: 14.2 g/dL (ref 13.0–17.0)
WBC: 9.9 10*3/uL (ref 4.0–10.5)

## 2010-10-11 ENCOUNTER — Emergency Department (HOSPITAL_COMMUNITY)
Admission: EM | Admit: 2010-10-11 | Discharge: 2010-10-11 | Disposition: A | Discharge status: Home / Self Care | Payer: Worker's Compensation | Attending: Emergency Medicine | Admitting: Emergency Medicine

## 2010-10-11 ENCOUNTER — Emergency Department (HOSPITAL_COMMUNITY): Payer: Worker's Compensation

## 2010-10-11 DIAGNOSIS — R296 Repeated falls: Secondary | ICD-10-CM | POA: Insufficient documentation

## 2010-10-11 DIAGNOSIS — Z79899 Other long term (current) drug therapy: Secondary | ICD-10-CM | POA: Insufficient documentation

## 2010-10-11 DIAGNOSIS — S0510XA Contusion of eyeball and orbital tissues, unspecified eye, initial encounter: Secondary | ICD-10-CM | POA: Insufficient documentation

## 2010-10-11 DIAGNOSIS — M25519 Pain in unspecified shoulder: Secondary | ICD-10-CM | POA: Insufficient documentation

## 2010-10-11 DIAGNOSIS — S0003XA Contusion of scalp, initial encounter: Secondary | ICD-10-CM | POA: Insufficient documentation

## 2010-10-11 DIAGNOSIS — G40909 Epilepsy, unspecified, not intractable, without status epilepticus: Secondary | ICD-10-CM | POA: Insufficient documentation

## 2010-10-11 DIAGNOSIS — IMO0002 Reserved for concepts with insufficient information to code with codable children: Secondary | ICD-10-CM | POA: Insufficient documentation

## 2010-10-11 DIAGNOSIS — I10 Essential (primary) hypertension: Secondary | ICD-10-CM | POA: Insufficient documentation

## 2010-10-11 DIAGNOSIS — S1093XA Contusion of unspecified part of neck, initial encounter: Secondary | ICD-10-CM | POA: Insufficient documentation

## 2010-10-14 ENCOUNTER — Emergency Department (HOSPITAL_COMMUNITY)
Admission: EM | Admit: 2010-10-14 | Discharge: 2010-10-14 | Disposition: A | Discharge status: Home / Self Care | Payer: Self-pay | Attending: Emergency Medicine | Admitting: Emergency Medicine

## 2010-10-14 DIAGNOSIS — M25519 Pain in unspecified shoulder: Secondary | ICD-10-CM | POA: Insufficient documentation

## 2010-10-14 DIAGNOSIS — Z4802 Encounter for removal of sutures: Secondary | ICD-10-CM | POA: Insufficient documentation

## 2010-10-14 DIAGNOSIS — G40909 Epilepsy, unspecified, not intractable, without status epilepticus: Secondary | ICD-10-CM | POA: Insufficient documentation

## 2010-10-14 DIAGNOSIS — I10 Essential (primary) hypertension: Secondary | ICD-10-CM | POA: Insufficient documentation

## 2010-10-22 ENCOUNTER — Emergency Department (HOSPITAL_COMMUNITY)
Admission: EM | Admit: 2010-10-22 | Discharge: 2010-10-22 | Disposition: A | Discharge status: Home / Self Care | Payer: Self-pay | Attending: Emergency Medicine | Admitting: Emergency Medicine

## 2010-10-22 ENCOUNTER — Emergency Department (HOSPITAL_COMMUNITY): Admission: EM | Admit: 2010-10-22 | Payer: Worker's Compensation | Source: Home / Self Care

## 2010-10-22 DIAGNOSIS — R296 Repeated falls: Secondary | ICD-10-CM | POA: Insufficient documentation

## 2010-10-22 DIAGNOSIS — I10 Essential (primary) hypertension: Secondary | ICD-10-CM | POA: Insufficient documentation

## 2010-10-22 DIAGNOSIS — G40909 Epilepsy, unspecified, not intractable, without status epilepticus: Secondary | ICD-10-CM | POA: Insufficient documentation

## 2010-10-22 DIAGNOSIS — M25519 Pain in unspecified shoulder: Secondary | ICD-10-CM | POA: Insufficient documentation

## 2010-11-03 LAB — WOUND CULTURE

## 2010-11-16 ENCOUNTER — Ambulatory Visit (HOSPITAL_COMMUNITY)
Admission: RE | Admit: 2010-11-16 | Discharge: 2010-11-16 | Disposition: A | Discharge status: Home / Self Care | Payer: Self-pay | Source: Ambulatory Visit | Attending: Cardiovascular Disease | Admitting: Cardiovascular Disease

## 2010-11-16 ENCOUNTER — Encounter: Payer: Self-pay | Admitting: Internal Medicine

## 2010-11-16 ENCOUNTER — Ambulatory Visit (INDEPENDENT_AMBULATORY_CARE_PROVIDER_SITE_OTHER): Payer: Self-pay | Admitting: Internal Medicine

## 2010-11-16 VITALS — BP 148/98 | HR 124 | Temp 98.8°F | Ht 65.0 in | Wt 114.4 lb

## 2010-11-16 DIAGNOSIS — M25519 Pain in unspecified shoulder: Secondary | ICD-10-CM

## 2010-11-16 DIAGNOSIS — G40909 Epilepsy, unspecified, not intractable, without status epilepticus: Secondary | ICD-10-CM

## 2010-11-16 DIAGNOSIS — I498 Other specified cardiac arrhythmias: Secondary | ICD-10-CM | POA: Insufficient documentation

## 2010-11-16 DIAGNOSIS — I517 Cardiomegaly: Secondary | ICD-10-CM | POA: Insufficient documentation

## 2010-11-16 DIAGNOSIS — R000 Tachycardia, unspecified: Secondary | ICD-10-CM

## 2010-11-16 DIAGNOSIS — M25512 Pain in left shoulder: Secondary | ICD-10-CM

## 2010-11-16 DIAGNOSIS — F1010 Alcohol abuse, uncomplicated: Secondary | ICD-10-CM

## 2010-11-16 DIAGNOSIS — Z23 Encounter for immunization: Secondary | ICD-10-CM

## 2010-11-16 LAB — ETHANOL: Alcohol, Ethyl (B): <11 mg/dL (ref 0–10)

## 2010-11-16 MED ORDER — PHENYTOIN SODIUM EXTENDED 100 MG PO CAPS: 100.0000 mg | ORAL_CAPSULE | Freq: Three times a day (TID) | ORAL | Status: DC

## 2010-11-16 MED ORDER — NAPROXEN 500 MG PO TABS: 500.0000 mg | ORAL_TABLET | Freq: Two times a day (BID) | ORAL | Status: DC

## 2010-11-16 MED ORDER — CLONAZEPAM 0.25 MG PO TBDP: ORAL_TABLET | ORAL | Status: DC

## 2010-11-16 NOTE — Patient Instructions (Signed)
Please, do  avodi any alcohol containing products. You are advised AGAINST driving and/or operating any machinery until further instructions. Call with any questions and follow up in 1 week. Thank you.
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
## 2010-11-17 LAB — PHENYTOIN LEVEL, TOTAL: Phenytoin Lvl: 0.6 ug/mL — ABNORMAL LOW (ref 10.0–20.0)

## 2010-11-17 LAB — TSH: TSH: 0.786 u[IU]/mL (ref 0.350–4.500)

## 2010-11-18 LAB — FOLATE RBC: RBC Folate: 148 ng/mL — ABNORMAL LOW (ref >=366)

## 2010-11-19 ENCOUNTER — Ambulatory Visit (HOSPITAL_COMMUNITY)
Admission: RE | Admit: 2010-11-19 | Discharge: 2010-11-19 | Disposition: A | Discharge status: Home / Self Care | Payer: Self-pay | Source: Ambulatory Visit | Attending: Internal Medicine | Admitting: Internal Medicine

## 2010-11-19 ENCOUNTER — Encounter: Payer: Self-pay | Admitting: Internal Medicine

## 2010-11-19 ENCOUNTER — Ambulatory Visit (INDEPENDENT_AMBULATORY_CARE_PROVIDER_SITE_OTHER): Payer: Self-pay | Admitting: Internal Medicine

## 2010-11-19 DIAGNOSIS — R209 Unspecified disturbances of skin sensation: Secondary | ICD-10-CM

## 2010-11-19 DIAGNOSIS — M25519 Pain in unspecified shoulder: Secondary | ICD-10-CM

## 2010-11-19 DIAGNOSIS — R000 Tachycardia, unspecified: Secondary | ICD-10-CM

## 2010-11-19 DIAGNOSIS — R202 Paresthesia of skin: Secondary | ICD-10-CM

## 2010-11-19 DIAGNOSIS — F1010 Alcohol abuse, uncomplicated: Secondary | ICD-10-CM

## 2010-11-19 DIAGNOSIS — G40909 Epilepsy, unspecified, not intractable, without status epilepticus: Secondary | ICD-10-CM

## 2010-11-19 DIAGNOSIS — M25512 Pain in left shoulder: Secondary | ICD-10-CM

## 2010-11-19 DIAGNOSIS — M5030 Other cervical disc degeneration, unspecified cervical region: Secondary | ICD-10-CM | POA: Insufficient documentation

## 2010-11-19 DIAGNOSIS — Z23 Encounter for immunization: Secondary | ICD-10-CM

## 2010-11-19 DIAGNOSIS — M542 Cervicalgia: Secondary | ICD-10-CM

## 2010-11-19 LAB — VITAMIN B1

## 2010-11-19 MED ORDER — FOLIC ACID 1 MG PO TABS: 1.0000 mg | ORAL_TABLET | Freq: Every day | ORAL | Status: DC

## 2010-11-19 MED ORDER — PHENYTOIN SODIUM EXTENDED 100 MG PO CAPS: 200.0000 mg | ORAL_CAPSULE | Freq: Three times a day (TID) | ORAL | Status: DC

## 2010-11-19 MED ORDER — KETOROLAC TROMETHAMINE 15 MG/ML IJ SOLN
15.0000 mg | Freq: Once | INTRAMUSCULAR | Status: AC
  Administered 2010-11-19: 15 mg via INTRAMUSCULAR

## 2010-11-19 NOTE — Progress Notes (Signed)
  Subjective:    Patient ID: Joseph Underwood, male    DOB: 1965-11-28, 45 y.o.   MRN: [id number]  HPI  Follow up appointment: 1. Seizure disorder. No new episodes. Takes Phenytoin 100 mg PO tid. 2. Left shoulder pain. No change in severity but did not pick up his Rx for naprosyn.  C-spine Xray done today. Requests an extension on his work absence excuse.  Review of Systems    No new Sx. Objective:   Physical Exam  Constitutional: He is oriented to person, place, and time. No distress.  HENT:  Right Ear: External ear normal.  Left Ear: External ear normal.  Eyes: EOM are normal. Pupils are equal, round, and reactive to light.  Neck: Normal range of motion. Neck supple.  Cardiovascular: Normal rate, regular rhythm, normal heart sounds and intact distal pulses.   Pulmonary/Chest: Effort normal and breath sounds normal.  Abdominal: Soft. Bowel sounds are normal.  Musculoskeletal: Normal range of motion.  Neurological: He is alert and oriented to person, place, and time. He has normal reflexes.  Skin: He is not diaphoretic.          Assessment & Plan:  1. Seizure disorder. Corrected Phenytoin level is 2. Will increase dose to 200 mg PO tid and recheck in 4-5 days. Seizure precautions given. 2. Alcoholism/low folate levels -start Folate 1 mg PO daily -Thiamine level pending 3. Left scapular pain --likely due to cervicalgia and brachial plexus impingement(?). -[place] study and EMG ordered -Toradol 15 mg IM x1 now for pain RTC in 1 week.

## 2010-11-19 NOTE — Patient Instructions (Signed)
Please, take 200 mg of Phenytoin (Dilantin) three times daily. Please, pickup your prescription for Naprosyn and Vitamins. Please, return on Tuesday for a Dilantin level check. Call with any question.

## 2010-11-23 ENCOUNTER — Telehealth: Payer: Self-pay | Admitting: *Deleted

## 2010-11-23 ENCOUNTER — Other Ambulatory Visit: Payer: Self-pay

## 2010-11-23 ENCOUNTER — Ambulatory Visit: Payer: Self-pay | Admitting: Licensed Clinical Social Worker

## 2010-11-23 DIAGNOSIS — G40909 Epilepsy, unspecified, not intractable, without status epilepticus: Secondary | ICD-10-CM

## 2010-11-23 DIAGNOSIS — R569 Unspecified convulsions: Secondary | ICD-10-CM

## 2010-11-23 NOTE — Telephone Encounter (Signed)
Pt's mother has a list of meds with her on that list there is: lisino/HCTZ 10- 12.5 mg tabs 1 tab daily. Do you want this continued? If so he uses [facility] [address]. If so add to med list. Please advise

## 2010-11-23 NOTE — Progress Notes (Signed)
Met w/ patient and mo. [employer].  Patient is pleasant 45 yo AA gentleman w/ hx of alcohol abuse and last seizure 8/12.   Patient and mother were very forthcoming w/ hx and interested in resources.   Soc. 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  Patient works for [employer] and has worked there for 28 years.  Since his seizure incident in August 2012, patient continues to get paid from the company.  Does not have benefits.   Also has left shoulder problems that he would like to address.   Education:  Patient went to [employer] and graduated.   ETOH: Has done both inpatient and outpatient therapy through [facility], [facility], and [facility].  It has been a few years since he was connected to ETOH resources.  Encouraged reconnection to [employer]. Services alcohol counseling programs as well as AA.  Pt was connected to AA on [employer] [employer].     Disability:  Patient wants to get back to work this month.  Explained how Disability and Medicaid works that he must be unable to work.  Discussed Voc Rehab as an option to assist him w/ retraining and possibly assistance w/ shoulder surgery.    Financial Assist:  Mother said he is overincome for orange card.  He makes over $2,000 per month but only brings home $339 per week.    1)  Encouraged reconnection to counseling and AA to maintain sobriety.   2) Encouraged to explore voc rehab for evaluation and other job options/Educated re: [employer] jobs program.    3)  Patient and mother encouraged to ask MD/ nurse to facilitate referral to one of the universities for shoulder issues.

## 2010-11-23 NOTE — Telephone Encounter (Signed)
I can't say as I have never seen the pt, do you know if this was noted on past visit. I will not refill at this time and please schedule pt for a visit with me in future to reassess.

## 2010-11-24 ENCOUNTER — Telehealth: Payer: Self-pay | Admitting: Internal Medicine

## 2010-11-24 DIAGNOSIS — F1010 Alcohol abuse, uncomplicated: Secondary | ICD-10-CM

## 2010-11-24 DIAGNOSIS — G40909 Epilepsy, unspecified, not intractable, without status epilepticus: Secondary | ICD-10-CM

## 2010-11-24 DIAGNOSIS — R000 Tachycardia, unspecified: Secondary | ICD-10-CM

## 2010-11-24 DIAGNOSIS — M25512 Pain in left shoulder: Secondary | ICD-10-CM

## 2010-11-24 DIAGNOSIS — R569 Unspecified convulsions: Secondary | ICD-10-CM

## 2010-11-24 LAB — PHENYTOIN LEVEL, TOTAL: Phenytoin Lvl: 34.1 ug/mL — ABNORMAL HIGH (ref 10.0–20.0)

## 2010-11-24 NOTE — Telephone Encounter (Signed)
Called and notified the patient of a Phenytoin level of 34.1. Instructed not to take Phenytoin until further instructions and return tomorrow (11/25/10) to repeat Phenyotin level.Patient verbalized understanding and promissed to be at the clnic tomorrow at 9 am.

## 2010-11-25 ENCOUNTER — Other Ambulatory Visit: Payer: Self-pay

## 2010-11-25 DIAGNOSIS — R569 Unspecified convulsions: Secondary | ICD-10-CM

## 2010-11-25 LAB — PHENYTOIN LEVEL, TOTAL: Phenytoin Lvl: 34.1 ug/mL (ref 10.0–20.0)

## 2010-11-25 MED ORDER — PHENYTOIN SODIUM EXTENDED 100 MG PO CAPS: 200.0000 mg | ORAL_CAPSULE | Freq: Two times a day (BID) | ORAL | Status: DC

## 2010-11-25 NOTE — Telephone Encounter (Signed)
Addended by: Doneen Poisson on: 11/25/2010 02:20 PM   Modules accepted: Orders

## 2010-11-25 NOTE — Telephone Encounter (Signed)
Dilantin level on 11/25/2010 was 34.1 (critical high).  I called Joseph Underwood and he confirmed that he had not taken the dilantin as instructed.  He is currently w/o complaints.  I therefore asked him to not take dilantin for an additional 2 days (Thursday and Friday).  On Saturday he will restart the dilantin but take it at 200 mg PO BID.  He is willing to come in for a repeat blood level on Monday, October 15th around 11 AM and this will be scheduled.  He has a follow-up appointment already scheduled with Dr. Denton Underwood on 12/03/2010.

## 2010-11-29 ENCOUNTER — Other Ambulatory Visit: Payer: Self-pay

## 2010-11-29 ENCOUNTER — Other Ambulatory Visit: Payer: Self-pay | Admitting: Internal Medicine

## 2010-11-29 DIAGNOSIS — M25512 Pain in left shoulder: Secondary | ICD-10-CM

## 2010-11-29 DIAGNOSIS — R569 Unspecified convulsions: Secondary | ICD-10-CM

## 2010-11-29 DIAGNOSIS — F1010 Alcohol abuse, uncomplicated: Secondary | ICD-10-CM

## 2010-11-29 DIAGNOSIS — G40909 Epilepsy, unspecified, not intractable, without status epilepticus: Secondary | ICD-10-CM

## 2010-11-29 DIAGNOSIS — R000 Tachycardia, unspecified: Secondary | ICD-10-CM

## 2010-11-29 MED ORDER — PHENYTOIN SODIUM EXTENDED 100 MG PO CAPS: 100.0000 mg | ORAL_CAPSULE | Freq: Two times a day (BID) | ORAL | Status: DC

## 2010-11-29 NOTE — Progress Notes (Signed)
Spoke with the patient and related Lab results. Phenytoin level at 8.4 Will restart Phenytoin at 100 mg PO bid and recheck level in 4 days. Patient verbalized understanding.

## 2010-12-02 ENCOUNTER — Encounter: Payer: Self-pay | Admitting: Internal Medicine

## 2010-12-03 ENCOUNTER — Encounter: Payer: Self-pay | Admitting: Internal Medicine

## 2010-12-03 ENCOUNTER — Ambulatory Visit (INDEPENDENT_AMBULATORY_CARE_PROVIDER_SITE_OTHER): Payer: Self-pay | Admitting: Internal Medicine

## 2010-12-03 VITALS — BP 176/100 | HR 105 | Temp 99.2°F | Ht 65.0 in | Wt 118.2 lb

## 2010-12-03 DIAGNOSIS — M25512 Pain in left shoulder: Secondary | ICD-10-CM

## 2010-12-03 DIAGNOSIS — M25519 Pain in unspecified shoulder: Secondary | ICD-10-CM

## 2010-12-03 DIAGNOSIS — R000 Tachycardia, unspecified: Secondary | ICD-10-CM

## 2010-12-03 DIAGNOSIS — F1010 Alcohol abuse, uncomplicated: Secondary | ICD-10-CM

## 2010-12-03 DIAGNOSIS — R569 Unspecified convulsions: Secondary | ICD-10-CM

## 2010-12-03 DIAGNOSIS — G40909 Epilepsy, unspecified, not intractable, without status epilepticus: Secondary | ICD-10-CM

## 2010-12-03 DIAGNOSIS — I10 Essential (primary) hypertension: Secondary | ICD-10-CM

## 2010-12-03 MED ORDER — LISINOPRIL-HYDROCHLOROTHIAZIDE 10-12.5 MG PO TABS: 1.0000 | ORAL_TABLET | Freq: Every day | ORAL | Status: DC

## 2010-12-03 MED ORDER — LIDOCAINE 5 % EX PTCH: 1.0000 | MEDICATED_PATCH | Freq: Two times a day (BID) | CUTANEOUS | Status: DC

## 2010-12-03 NOTE — Progress Notes (Deleted)
  Subjective:    Patient ID: Joseph Underwood, male    DOB: July 18, 1965, 45 y.o.   MRN: [id number]  HPI    Review of Systems     Objective:   Physical Exam        Assessment & Plan:

## 2010-12-03 NOTE — Patient Instructions (Signed)
Please, return to clinic after the test on 12/13/10 unless instructed otherwise. Please, call with any questions.

## 2010-12-03 NOTE — Progress Notes (Signed)
HPI: Follow up appointment. Continue to c/o left shoulder pain which is 7/10 in intensity. No new seizures; continues to abstain from ETOH; takes his meds as prescribed.  Past Medical History  Diagnosis Date  . Seizures   . Alcohol abuse    Current Outpatient Prescriptions  Medication Sig Dispense Refill  . folic acid (FOLVITE) 1 MG tablet Take 1 tablet (1 mg total) by mouth daily.  100 tablet  3  . Multiple Vitamin (MULTIVITAMIN) tablet Take 1 tablet by mouth daily.        . naproxen (NAPROSYN) 500 MG tablet Take 1 tablet (500 mg total) by mouth 2 (two) times daily with a meal.  60 tablet  2  . phenytoin (DILANTIN) 100 MG ER capsule Take 1 capsule (100 mg total) by mouth 2 (two) times daily.  60 capsule  2  . lidocaine (LIDODERM) 5 % Place 1 patch onto the skin every 12 (twelve) hours. Remove & Discard patch within 12 hours or as directed by MD  10 patch  1  . lisinopril-hydrochlorothiazide (ZESTORETIC) 10-12.5 MG per tablet Take 1 tablet by mouth daily.  30 tablet  11   No family history on file. History   Social History  . Marital Status: Single    Spouse Name: N/A    Number of Children: N/A  . Years of Education: N/A   Social History Main Topics  . Smoking status: Current Everyday Smoker -- 0.4 packs/day for 20 years    Types: Cigarettes  . Smokeless tobacco: Never Used  . Alcohol Use: Yes     beer/liquor on the w/e's  . Drug Use: No  . Sexually Active: None   Other Topics Concern  . None   Social History Narrative  . None    Review of Systems: Constitutional: Denies fever, chills, diaphoresis, appetite change and fatigue.  HEENT: Denies photophobia, eye pain, redness, hearing loss, ear pain, congestion, sore throat, rhinorrhea, sneezing, mouth sores, trouble swallowing, neck pain, neck stiffness and tinnitus.  Respiratory: Denies SOB, DOE, cough, chest tightness, and wheezing.  Cardiovascular: Denies chest pain, palpitations and leg swelling.  Gastrointestinal:  Denies nausea, vomiting, abdominal pain, diarrhea, constipation, blood in stool and abdominal distention.  Genitourinary: Denies dysuria, urgency, frequency, hematuria, flank pain and difficulty urinating.  Musculoskeletal: Denies myalgias, back pain, joint swelling, arthralgias and gait problem.  Skin: Denies pallor, rash and wound.  Neurological: Denies dizziness, seizures, syncope, weakness, light-headedness, numbness and headaches.  Hematological: Denies adenopathy. Easy bruising, personal or family bleeding history  Psychiatric/Behavioral: Denies suicidal ideation, mood changes, confusion, nervousness, sleep disturbance and agitation   Vitals: reviewed General: alert, well-developed, and cooperative to examination.  Head: normocephalic and atraumatic.  Eyes: vision grossly intact, pupils equal, pupils round, pupils reactive to light, no injection and anicteric.  Mouth: pharynx pink and moist, no erythema, and no exudates.  Neck: supple, full ROM, no thyromegaly, no JVD, and no carotid bruits.  Lungs: normal respiratory effort, no accessory muscle use, normal breath sounds, no crackles, and no wheezes. Heart: normal rate, regular rhythm, no murmur, no gallop, and no rub.  Abdomen: soft, non-tender, normal bowel sounds, no distention, no guarding, no rebound tenderness, no hepatomegaly, and no splenomegaly.  Msk: no joint swelling, no joint warmth, and no redness over joints.  Pulses: 2+ DP/PT pulses bilaterally Extremities: No cyanosis, clubbing, edema Neurologic: alert & oriented X3, cranial nerves II-XII intact, strength normal in all extremities, sensation intact to light touch, and gait normal.  Skin: turgor normal  and no rashes.  Psych: Oriented X3, memory intact for recent and remote, normally interactive, good eye contact, not anxious appearing, and not depressed appearing.    Assessment & Plan: 1.Left shoulder Pain -Will await for EMG study (scheduled for 12/13/10) -add  Lidocaine TDS to Naprosyn -consider TENS unit  2. Seizure disorder -having difficulty to control Dilantin within therapeutic range: will recheck  Today and follow up as indicated. -continue to abstain from alcohol use -encouraged to attend AAA meetings. Follow up in 2 weeks  3. HTN-rechecked BP: 160/88 with HR of 100. -restart Zestoretic -recheck next visit.

## 2010-12-08 ENCOUNTER — Telehealth: Payer: Self-pay | Admitting: *Deleted

## 2010-12-08 NOTE — Telephone Encounter (Signed)
GOT PHONE CALL FROM THE [facility] REGARDING EMG REFERRAL ON [name]. PATIENT WAS TO SEE Joseph Underwood. PATIENT WAS OVER INCOME. SO PATIENT IS NOT ABLE TO AFFORD TO DO THE EMG TESTING.  PAIN CLINIC SAYS IT COST ABOUT 1,400$ TO DO. SO THIS RFERRAL CANCELLED. DR KARIMOVA WAS INFORMED OF THIS. [name] NTII   10-24-012  10:53AM

## 2010-12-13 ENCOUNTER — Ambulatory Visit: Payer: Self-pay | Attending: Internal Medicine | Admitting: Rehabilitative and Restorative Service Providers"

## 2010-12-13 DIAGNOSIS — M546 Pain in thoracic spine: Secondary | ICD-10-CM | POA: Insufficient documentation

## 2010-12-13 DIAGNOSIS — M25519 Pain in unspecified shoulder: Secondary | ICD-10-CM | POA: Insufficient documentation

## 2010-12-13 DIAGNOSIS — IMO0001 Reserved for inherently not codable concepts without codable children: Secondary | ICD-10-CM | POA: Insufficient documentation

## 2010-12-15 ENCOUNTER — Ambulatory Visit: Payer: Self-pay | Admitting: Rehabilitative and Restorative Service Providers"

## 2010-12-21 ENCOUNTER — Ambulatory Visit: Payer: Self-pay | Attending: Internal Medicine | Admitting: Rehabilitation

## 2010-12-21 DIAGNOSIS — M25519 Pain in unspecified shoulder: Secondary | ICD-10-CM | POA: Insufficient documentation

## 2010-12-21 DIAGNOSIS — M546 Pain in thoracic spine: Secondary | ICD-10-CM | POA: Insufficient documentation

## 2010-12-21 DIAGNOSIS — IMO0001 Reserved for inherently not codable concepts without codable children: Secondary | ICD-10-CM | POA: Insufficient documentation

## 2010-12-22 ENCOUNTER — Encounter: Payer: Self-pay | Admitting: Rehabilitation

## 2010-12-22 ENCOUNTER — Ambulatory Visit: Payer: Self-pay | Admitting: Rehabilitation

## 2010-12-28 ENCOUNTER — Ambulatory Visit: Payer: Self-pay | Admitting: Rehabilitative and Restorative Service Providers"

## 2010-12-29 ENCOUNTER — Encounter: Payer: Self-pay | Admitting: Rehabilitative and Restorative Service Providers"

## 2010-12-30 ENCOUNTER — Ambulatory Visit: Payer: Self-pay | Admitting: Rehabilitation

## 2011-01-04 ENCOUNTER — Ambulatory Visit: Payer: Self-pay | Admitting: Rehabilitation

## 2011-01-11 ENCOUNTER — Ambulatory Visit: Payer: Self-pay | Admitting: Rehabilitative and Restorative Service Providers"

## 2011-01-13 ENCOUNTER — Ambulatory Visit: Payer: Self-pay | Admitting: Rehabilitative and Restorative Service Providers"

## 2011-02-16 ENCOUNTER — Emergency Department (HOSPITAL_COMMUNITY)
Admission: EM | Admit: 2011-02-16 | Discharge: 2011-02-16 | Discharge status: Against Medical Advice | Payer: Self-pay | Attending: Emergency Medicine | Admitting: Emergency Medicine

## 2011-02-16 ENCOUNTER — Encounter (HOSPITAL_COMMUNITY): Payer: Self-pay | Admitting: *Deleted

## 2011-02-16 DIAGNOSIS — G43909 Migraine, unspecified, not intractable, without status migrainosus: Secondary | ICD-10-CM | POA: Insufficient documentation

## 2011-02-16 NOTE — ED Notes (Signed)
Pt reports migraine headache x 2 days associated with photophobia and nausea. Pt reports his medications are making him feel bad. Neuro exam wnl. Pt reports he drinks almost everyday.

## 2011-02-16 NOTE — ED Notes (Signed)
Received pt. From Triage pt. Alert and oriented, NAD noted, pt. Ambulatory, gait steady

## 2011-02-16 NOTE — ED Notes (Signed)
Pt. Walked out , pt. Stated "I don't want to wait anymore", attempted to encourage pt. To wait for provider, pt. refused

## 2011-02-17 ENCOUNTER — Telehealth: Payer: Self-pay | Admitting: *Deleted

## 2011-02-17 NOTE — Telephone Encounter (Signed)
Pt's mother calls somewhat distraught stating pt is not well, states he went to ED last pm but told her that he was left and no one helped him, she states he was unable to work today and is home alone, she wants to have him seen in clinic but not until Tuesday, she is offered several times appts earlier than tues but insists that he been seen Tuesday. An appt is given for 1045 1/8. She is greatly encouraged to take him to ED or urg care if needed, she is agreeable

## 2011-02-22 ENCOUNTER — Ambulatory Visit (INDEPENDENT_AMBULATORY_CARE_PROVIDER_SITE_OTHER): Payer: Self-pay | Admitting: Internal Medicine

## 2011-02-22 ENCOUNTER — Encounter: Payer: Self-pay | Admitting: Internal Medicine

## 2011-02-22 VITALS — BP 155/91 | HR 106 | Temp 97.6°F | Ht 65.0 in | Wt 114.2 lb

## 2011-02-22 DIAGNOSIS — F1020 Alcohol dependence, uncomplicated: Secondary | ICD-10-CM

## 2011-02-22 DIAGNOSIS — R42 Dizziness and giddiness: Secondary | ICD-10-CM | POA: Insufficient documentation

## 2011-02-22 DIAGNOSIS — K7010 Alcoholic hepatitis without ascites: Secondary | ICD-10-CM

## 2011-02-22 DIAGNOSIS — R569 Unspecified convulsions: Secondary | ICD-10-CM

## 2011-02-22 LAB — CBC
HCT: 34.5 % — ABNORMAL LOW (ref 39.0–52.0)
Hemoglobin: 12.2 g/dL — ABNORMAL LOW (ref 13.0–17.0)
MCH: 34.6 pg — ABNORMAL HIGH (ref 26.0–34.0)
MCV: 97.7 fL (ref 78.0–100.0)
RBC: 3.53 MIL/uL — ABNORMAL LOW (ref 4.22–5.81)

## 2011-02-22 NOTE — Patient Instructions (Signed)
Return to clinic Friday 02/25/11 at 1:15pm to see Dr. Yaakov Guthrie

## 2011-02-22 NOTE — Assessment & Plan Note (Signed)
Still drinking.  Pt reports 4-5 twelve oz beers every couple of days, but family member reports that he hides his drinking so he may be drinking more.    Hypertensive today despite reporting that he takes his medications and borderline tachycardic.  Also some mild tremor and facial twitching.  Family member and patient say the tremor and facial twitching is baseline, but the picture seems concerning for alcohol withdrawal.  However, patient reports he last drank yesterday.  Will have him to return to clinic in 3 days for another check.

## 2011-02-22 NOTE — Assessment & Plan Note (Signed)
For one week.  Happens only when bending over or standing up, not while laying or sitting.  Associated with seeing spots. Not orthostatic on exam today, but description sounds suspicious for orthostatic hypotension. Exam is normal except for baseline tremor and facial twitching, as well as the patient having the hiccups.    Etiology is unsure.  Will check basic labs today CMET, CBC, dilantin level and have patient return on Friday in three days for a recheck.

## 2011-02-22 NOTE — Assessment & Plan Note (Signed)
Checking CMET today 

## 2011-02-22 NOTE — Progress Notes (Signed)
Patient seen and examined. I agree with Dr. Camillia Herter assessment and plan.

## 2011-02-22 NOTE — Progress Notes (Signed)
Subjective:   Patient ID: Joseph Underwood male   DOB: 24-Jun-1965 46 y.o.   MRN: [id number]  HPI: Joseph Underwood is a 46 y.o. with ongoing alcohol abuse and history of seizures presenting with lightheadedness when standing or bending over for the past 1-2 weeks.  Only when bending over or standing up.  Sometimes sees spots with it.  No lightheadedness at sitting of laying down.  No lightheadedness today sitting on exam table. Has had this in the past but not as bad.    He had an episode late last week where he felt sweaty and dizzy, which is typical of when he has an impending seizure, but then a seizure never came.  He reports not having a seizure since last visit to our clinic.    Of note, he had a headache for two days last week.  He went to the ED but left before being seen bc the wait was too long.  The headache was 8/10 throbbing across his forehead and resolved in two days.  He reports good compliance to his BP medicine and dilantin.  He says he has missed about 3 doses total of each in the past month.  He started drinking again, 4-5 beers every couple days. He does not wish to stop at this time.        Past Medical History  Diagnosis Date  . Seizures   . Alcohol abuse    Current Outpatient Prescriptions  Medication Sig Dispense Refill  . folic acid (FOLVITE) 1 MG tablet Take 1 mg by mouth daily.        Marland Kitchen lisinopril-hydrochlorothiazide (PRINZIDE,ZESTORETIC) 10-12.5 MG per tablet Take 1 tablet by mouth daily.        . phenytoin (DILANTIN) 100 MG ER capsule Take 100 mg by mouth 2 (two) times daily.         No family history on file. History   Social History  . Marital Status: Single    Spouse Name: N/A    Number of Children: N/A  . Years of Education: N/A   Social History Main Topics  . Smoking status: Current Everyday Smoker -- 0.5 packs/day for 20 years    Types: Cigarettes  . Smokeless tobacco: Never Used  . Alcohol Use: Yes     beer/liquor on the w/e's  . Drug  Use: No  . Sexually Active: None   Other Topics Concern  . None   Social History Narrative  . None   Review of Systems: Constitutional: Denies fever, chills Respiratory: Denies SOB, DOE, cough, chest tightness,  and wheezing.   Cardiovascular: Denies chest pain, palpitations  Gastrointestinal: Denies nausea, vomiting, abdominal pain, diarrhea, constipation Genitourinary: Denies dysuria, urgency, frequency, hematuria, flank pain and difficulty urinating.  Neurological: See HPI  Objective:  Physical Exam: Filed Vitals:   02/22/11 1035 02/22/11 1045 02/22/11 1046 02/22/11 1047  BP: 153/90 166/93 164/95 155/91  Pulse: 101 99 103 106  Temp: 97.6 F (36.4 C)     TempSrc: Oral     Height: 5\' 5"  (1.651 m)     Weight: 114 lb 3.2 oz (51.801 kg)     SpO2: 97%      Constitutional: Vital signs reviewed.  Patient is a well-developed and well-nourished man in no acute distress and cooperative with exam. Alert and oriented x3.  Head: Normocephalic and atraumatic Mouth: no erythema or exudates, MMM Eyes: PERRL, EOMI, conjunctivae normal, No scleral icterus. No nystagmus. Facial twitching. Neck: No JVD Cardiovascular: RRR,  S1 normal, S2 normal, no MRG, pulses symmetric and intact bilaterally Pulmonary/Chest: CTAB, no wheezes, rales, or rhonchi Abdominal: Soft. Non-tender, non-distended, bowel sounds are normal.   Neurological: A&O x3, Strength is normal and symmetric bilaterally, cranial nerve II-XII are grossly intact, no focal motor deficit, sensory intact to light touch bilaterally.  Mild tremor in upper extremities.  Finger-nose-finger and heel to shin intact.  No pronator drift.   Assessment & Plan:

## 2011-02-22 NOTE — Assessment & Plan Note (Signed)
Pt reports no seizures since last visit here.  One episode of patient feeling like he was going to get a seizure last week but he did not get one.  Reports taking his dilantin consistently.  Will check dilantin level today.

## 2011-02-23 LAB — COMPLETE METABOLIC PANEL WITH GFR
AST: 51 U/L — ABNORMAL HIGH (ref 0–37)
Alkaline Phosphatase: 82 U/L (ref 39–117)
BUN: 37 mg/dL — ABNORMAL HIGH (ref 6–23)
Creat: 1.02 mg/dL (ref 0.50–1.35)

## 2011-02-23 LAB — PHENYTOIN LEVEL, TOTAL: Phenytoin Lvl: <0.5 ug/mL — ABNORMAL LOW (ref 10.0–20.0)

## 2011-02-25 ENCOUNTER — Ambulatory Visit (INDEPENDENT_AMBULATORY_CARE_PROVIDER_SITE_OTHER): Payer: Self-pay | Admitting: Internal Medicine

## 2011-02-25 ENCOUNTER — Encounter: Payer: Self-pay | Admitting: Internal Medicine

## 2011-02-25 DIAGNOSIS — F1020 Alcohol dependence, uncomplicated: Secondary | ICD-10-CM

## 2011-02-25 DIAGNOSIS — R066 Hiccough: Secondary | ICD-10-CM

## 2011-02-25 DIAGNOSIS — R569 Unspecified convulsions: Secondary | ICD-10-CM

## 2011-02-25 DIAGNOSIS — R42 Dizziness and giddiness: Secondary | ICD-10-CM

## 2011-02-25 MED ORDER — FOLIC ACID 1 MG PO TABS: 1.0000 mg | ORAL_TABLET | Freq: Every day | ORAL | Status: DC

## 2011-02-25 MED ORDER — THIAMINE HCL 50 MG PO TABS: 50.0000 mg | ORAL_TABLET | Freq: Every day | ORAL | Status: DC

## 2011-02-25 MED ORDER — RANITIDINE HCL 150 MG PO TABS: 150.0000 mg | ORAL_TABLET | Freq: Two times a day (BID) | ORAL | Status: DC

## 2011-02-25 NOTE — Patient Instructions (Signed)
Return to clinic in 2-3 weeks.

## 2011-02-26 ENCOUNTER — Encounter: Payer: Self-pay | Admitting: Internal Medicine

## 2011-02-26 NOTE — Assessment & Plan Note (Signed)
Patient drank two beers today.  I suspect gastric irritation from alcohol is contributing.  Prescribed Zantac and again emphasized need for alcohol cessation

## 2011-02-26 NOTE — Progress Notes (Signed)
Subjective:   Patient ID: Joseph Underwood male   DOB: 06/27/1965 45 y.o.   MRN: [id number]  HPI: [name] is a 46 y.o. with history of seizures and alcohol abuse presents for follow-up.  He had been having lightheadedness when bending over for one week up to three days ago.  Sometimes associated with seeing spots.  This has been improving for the past three days but is still present.    He still is drinking- 2-6+ beers at a time most days.  He drank two beers today before this appointment.    He also complains of chronic hiccups for two years, happening almost every day.  No heartburn or chest pain.  Coughs more when he is laying down.  No episodes of regurgitated contents in his mouth.      Past Medical History  Diagnosis Date  . Seizures   . Alcohol abuse    Current Outpatient Prescriptions  Medication Sig Dispense Refill  . folic acid (FOLVITE) 1 MG tablet Take 1 tablet (1 mg total) by mouth daily.  30 tablet  2  . lisinopril-hydrochlorothiazide (PRINZIDE,ZESTORETIC) 10-12.5 MG per tablet Take 1 tablet by mouth daily.        . phenytoin (DILANTIN) 100 MG ER capsule Take 100 mg by mouth 2 (two) times daily.        . ranitidine (ZANTAC) 150 MG tablet Take 1 tablet (150 mg total) by mouth 2 (two) times daily.  60 tablet  1  . thiamine 50 MG tablet Take 1 tablet (50 mg total) by mouth daily.  30 tablet  2   No family history on file. History   Social History  . Marital Status: Single    Spouse Name: N/A    Number of Children: N/A  . Years of Education: N/A   Social History Main Topics  . Smoking status: Current Everyday Smoker -- 0.5 packs/day for 20 years    Types: Cigarettes  . Smokeless tobacco: Never Used  . Alcohol Use: Yes     beer/liquor on the w/e's  . Drug Use: No  . Sexually Active: None   Other Topics Concern  . None   Social History Narrative  . None   Review of Systems: Constitutional: Denies fever, chills, diaphoresis, appetite change      Respiratory: Denies SOB, DOE, cough, chest tightness,  and wheezing.   Cardiovascular: Denies chest pain, palpitations and leg swelling.  Gastrointestinal: Denies nausea, vomiting, abdominal pain, diarrhea, constipation Genitourinary: Denies dysuria, urgency, frequency, hematuria   Neurological: see HPI   Objective:  Physical Exam: Filed Vitals:   02/25/11 1414 02/25/11 1415 02/25/11 1416  BP: 160/100 160/100 160/100  Pulse: 106 96 104  Temp: 97 F (36.1 C)    TempSrc: Oral    Height: 5\' 5"  (1.651 m)    Weight: 115 lb 9.6 oz (52.436 kg)    SpO2: 96%     Constitutional: Vital signs reviewed.  Patient is a well-developed and well-nourished man in no acute distress and cooperative with exam. Alert and oriented x3.  Head: Normocephalic and atraumatic  Mouth: no erythema or exudates, MMM Eyes: PERRL, EOMI, conjunctivae normal, No scleral icterus.  Neck:  No JVD Cardiovascular: RRR, S1 normal, S2 normal, no MRG, pulses symmetric and intact bilaterally Pulmonary/Chest: CTAB, no wheezes, rales, or rhonchi Abdominal: Soft. Non-tender, non-distended, bowel sounds are normal. Mild hiccups throughout exam.   Neurological: A&O x3, Strength is normal and symmetric bilaterally, cranial nerve II-XII are grossly intact,  no focal motor deficit, sensory intact to light touch bilaterally. 3-4 beats of horizontal nystagmus bilaterally.  Tremor improved from last visit and facial twitching also improved.   Assessment & Plan:

## 2011-02-26 NOTE — Assessment & Plan Note (Signed)
Dilantin level was very low at last visit.  Suspect non-compliance despite him reporting he was taking it.  Re-iterated importance of strong compliance.  Since we are unsure what he has been taking, will encourage him to take it regularly and recheck level at follow-up, no dosage change at this time.

## 2011-02-26 NOTE — Assessment & Plan Note (Signed)
Strongly encouraged patient to quit.  Gave procedure about resources.  Offered for him to meet with our social worker again.  He does not wish to quit at this time and declined to meet with Child psychotherapist.

## 2011-02-26 NOTE — Assessment & Plan Note (Addendum)
Improved from three days ago, but still present to some degree.  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  Orthostatics again normal today.  I emphasized importance of alcohol cessation, as I expect that this would likely improve his symptoms.  Will not treat his high BP at this time due to the orthostatic nature of his symptoms.   Will start thiamine supplementation in case thiamine deficiency is contributing to his symptoms.  He has had low Thiamine levels in past.

## 2011-02-28 ENCOUNTER — Encounter: Payer: Self-pay | Admitting: Internal Medicine

## 2011-02-28 NOTE — Progress Notes (Signed)
I discussed patient with resident Dr. Wainright, and I agree with the plans as outlined in his note. 

## 2011-03-10 ENCOUNTER — Ambulatory Visit (INDEPENDENT_AMBULATORY_CARE_PROVIDER_SITE_OTHER): Payer: Self-pay | Admitting: Internal Medicine

## 2011-03-10 ENCOUNTER — Encounter: Payer: Self-pay | Admitting: Internal Medicine

## 2011-03-10 DIAGNOSIS — R066 Hiccough: Secondary | ICD-10-CM

## 2011-03-10 DIAGNOSIS — R569 Unspecified convulsions: Secondary | ICD-10-CM

## 2011-03-10 DIAGNOSIS — F1020 Alcohol dependence, uncomplicated: Secondary | ICD-10-CM

## 2011-03-10 DIAGNOSIS — I10 Essential (primary) hypertension: Secondary | ICD-10-CM | POA: Insufficient documentation

## 2011-03-10 DIAGNOSIS — R42 Dizziness and giddiness: Secondary | ICD-10-CM

## 2011-03-10 MED ORDER — THIAMINE HCL 50 MG PO TABS: 50.0000 mg | ORAL_TABLET | Freq: Every day | ORAL | Status: DC

## 2011-03-10 MED ORDER — OMEPRAZOLE 20 MG PO CPDR: 20.0000 mg | DELAYED_RELEASE_CAPSULE | Freq: Every day | ORAL | Status: DC

## 2011-03-10 NOTE — Progress Notes (Signed)
Subjective:   Patient ID: Joseph Underwood male   DOB: 17-Nov-1965 46 y.o.   MRN: [id number]  HPI: Joseph Underwood is a 46 y.o. with alcohol abuse and seizures who presents for follow-up.  I saw him twice earlier this month for lightheadedness he was having when standing back up from bending over.  He was not orthostatic at either visit, and it was felt his symptoms were related to his alcohol use.  This has slowly improved since his first visit, but he has been working a lot less due to the weather, and bending over at work is when he experiences the symptoms most.  He continues to drink heavily, about 6 beers per day, including a binge last night with his friend.  He continues to have frequent hiccups.  The Zantac prescribed last time has not helped his hiccups at all.    He reports that he missed at least two days worth of dilantin and HCTZ-lisinopril since last visit.  His dilantin level was very low/undetectable at this first visit with me, and I had encouraged him at the second visit with me to take this regularly.  No seizures since his first visit with me.      Past Medical History  Diagnosis Date  . Seizures   . Alcohol abuse    Current Outpatient Prescriptions  Medication Sig Dispense Refill  . folic acid (FOLVITE) 1 MG tablet Take 1 tablet (1 mg total) by mouth daily.  30 tablet  2  . lisinopril-hydrochlorothiazide (PRINZIDE,ZESTORETIC) 10-12.5 MG per tablet Take 1 tablet by mouth daily.        Marland Kitchen omeprazole (PRILOSEC) 20 MG capsule Take 1 capsule (20 mg total) by mouth daily.  30 capsule  0  . phenytoin (DILANTIN) 100 MG ER capsule Take 100 mg by mouth 2 (two) times daily.        Marland Kitchen thiamine 50 MG tablet Take 1 tablet (50 mg total) by mouth daily.  30 tablet  2   No family history on file. History   Social History  . Marital Status: Single    Spouse Name: N/A    Number of Children: N/A  . Years of Education: N/A   Social History Main Topics  . Smoking status: Current  Everyday Smoker -- 0.5 packs/day for 20 years    Types: Cigarettes  . Smokeless tobacco: Never Used  . Alcohol Use: Yes     beer/liquor on the w/e's  . Drug Use: No  . Sexually Active: None   Other Topics Concern  . None   Social History Narrative  . None   Review of Systems: Constitutional: Denies fever, chills, diaphoresis, appetite change and fatigue.    Respiratory: Denies SOB, DOE, cough, chest tightness,  and wheezing.   Cardiovascular: Denies chest pain, palpitations and leg swelling.  Gastrointestinal: Denies nausea, vomiting, abdominal pain, diarrhea, constipation, blood in stool and abdominal distention.  Genitourinary: Denies dysuria, urgency, frequency, hematuria, flank pain and difficulty urinating.   Neurological: Denies syncope, weakness, numbness and headaches.    Objective:  Physical Exam: Filed Vitals:   03/10/11 1040  BP: 151/90  Pulse: 119  Temp: 97.9 F (36.6 C)  TempSrc: Oral  Height: 5\' 5"  (1.651 m)  Weight: 118 lb (53.524 kg)   Constitutional: Vital signs reviewed.  Patient is a well-developed man in no acute distress and cooperative with exam. Alert and oriented x3.  Head: Normocephalic and atraumatic Mouth: no erythema or exudates, MMM Eyes: PERRL, EOMI, conjunctivae  normal, No scleral icterus.  Neck: No JVD Cardiovascular: RRR, S1 normal, S2 normal, no MRG, pulses symmetric and intact bilaterally Pulmonary/Chest: CTAB, no wheezes, rales, or rhonchi Abdominal: Soft. Non-tender, non-distended.    Neurological: A&O x3, Strength is normal and symmetric bilaterally, cranial nerve II-XII are grossly intact, no focal motor deficit, sensory intact to light touch bilaterally.  No pronator drift.  Negative Romberg's.   Skin: Warm, dry and intact. No rash, cyanosis, or clubbing.   Assessment & Plan:

## 2011-03-10 NOTE — Assessment & Plan Note (Signed)
This is Mr. Tews big issue and the likely cause of the other problems he presents for besides maybe his seizures.   I discussed the health impacts with [name] of alcohol and the troubles it is causing him.  Discussed that he should try to attend alcoholic's anonymous meetings as he has in the past.  He agreed to try cessation.

## 2011-03-10 NOTE — Assessment & Plan Note (Signed)
Per past clinic notes he has history of head injury as original cause of his seizures.  However today patient says seizures just started and head injury was the result of falling during a seizure.  He does say he has had seizures at times when he has not drank alcohol for at least a week or two, so these do not seem to be due to alcohol, at least not entirely due to alcohol.  Based on observing pill bottle filled two months ago with 90 tablets being over half full still today, he has not taken dilantin very often.  When asked if checking a level today would be worthwhile, he said no as he had missed too many doses in the last week or two.  Encouraged him to take current regimen of dilantin as directed.  If he seems to have been doing this at next visit it would be appropriate to check a dilantin level.

## 2011-03-10 NOTE — Patient Instructions (Signed)
Return to clinic in 3 months

## 2011-03-10 NOTE — Assessment & Plan Note (Signed)
Most likely cause is alcohol primarily, with second most likely cause being gastric irritation from alcohol.  I emphasized that alcohol cessation would likely lead to resolution of his hiccups.  He acknowledged this.  Since Zantac did not help will discontinue this medicine, especially as it can slow alcohol metabolism.

## 2011-03-10 NOTE — Assessment & Plan Note (Signed)
Has not been taking his Lisinopril-HCTZ regularly, so no change for now.  Encouraged him to take this medication.  His alcohol use independently may also be raising his BP.

## 2011-03-10 NOTE — Assessment & Plan Note (Signed)
Continues to improve.   Likely related to his alcohol use.  Continue to observe on follow-up.

## 2011-03-14 NOTE — Progress Notes (Signed)
agree

## 2011-06-19 ENCOUNTER — Emergency Department (HOSPITAL_COMMUNITY): Payer: Self-pay

## 2011-06-19 ENCOUNTER — Encounter (HOSPITAL_COMMUNITY): Payer: Self-pay | Admitting: *Deleted

## 2011-06-19 ENCOUNTER — Emergency Department (HOSPITAL_COMMUNITY)
Admission: EM | Admit: 2011-06-19 | Discharge: 2011-06-20 | Disposition: A | Discharge status: Home / Self Care | Payer: Self-pay | Attending: Emergency Medicine | Admitting: Emergency Medicine

## 2011-06-19 DIAGNOSIS — Z91199 Patient's noncompliance with other medical treatment and regimen due to unspecified reason: Secondary | ICD-10-CM | POA: Insufficient documentation

## 2011-06-19 DIAGNOSIS — R066 Hiccough: Secondary | ICD-10-CM | POA: Insufficient documentation

## 2011-06-19 DIAGNOSIS — F17200 Nicotine dependence, unspecified, uncomplicated: Secondary | ICD-10-CM | POA: Insufficient documentation

## 2011-06-19 DIAGNOSIS — Z9114 Patient's other noncompliance with medication regimen: Secondary | ICD-10-CM

## 2011-06-19 DIAGNOSIS — R569 Unspecified convulsions: Secondary | ICD-10-CM | POA: Insufficient documentation

## 2011-06-19 DIAGNOSIS — Z9119 Patient's noncompliance with other medical treatment and regimen: Secondary | ICD-10-CM | POA: Insufficient documentation

## 2011-06-19 DIAGNOSIS — F1010 Alcohol abuse, uncomplicated: Secondary | ICD-10-CM | POA: Insufficient documentation

## 2011-06-19 LAB — URINALYSIS, ROUTINE W REFLEX MICROSCOPIC
Glucose, UA: 250 mg/dL — AB
Ketones, ur: NEGATIVE mg/dL
Leukocytes, UA: NEGATIVE
Nitrite: NEGATIVE
pH: 6.5 (ref 5.0–8.0)

## 2011-06-19 LAB — POCT I-STAT, CHEM 8
Creatinine, Ser: 0.9 mg/dL (ref 0.50–1.35)
Glucose, Bld: 157 mg/dL — ABNORMAL HIGH (ref 70–99)
HCT: 42 % (ref 39.0–52.0)
Hemoglobin: 14.3 g/dL (ref 13.0–17.0)
Potassium: 3.3 mEq/L — ABNORMAL LOW (ref 3.5–5.1)
Sodium: 135 mEq/L (ref 135–145)
TCO2: 27 mmol/L (ref 0–100)

## 2011-06-19 LAB — DIFFERENTIAL
Basophils Absolute: 0 10*3/uL (ref 0.0–0.1)
Lymphocytes Relative: 15 % (ref 12–46)
Lymphs Abs: 1.1 10*3/uL (ref 0.7–4.0)
Neutro Abs: 5.8 10*3/uL (ref 1.7–7.7)
Neutrophils Relative %: 78 % — ABNORMAL HIGH (ref 43–77)

## 2011-06-19 LAB — CBC
Platelets: 190 10*3/uL (ref 150–400)
RBC: 3.86 MIL/uL — ABNORMAL LOW (ref 4.22–5.81)
RDW: 14.3 % (ref 11.5–15.5)
WBC: 7.4 10*3/uL (ref 4.0–10.5)

## 2011-06-19 LAB — GLUCOSE, CAPILLARY: Glucose-Capillary: 163 mg/dL — ABNORMAL HIGH (ref 70–99)

## 2011-06-19 LAB — PHENYTOIN LEVEL, TOTAL: Phenytoin Lvl: <2.5 ug/mL — ABNORMAL LOW (ref 10.0–20.0)

## 2011-06-19 LAB — URINE MICROSCOPIC-ADD ON

## 2011-06-19 MED ORDER — ALBUTEROL SULFATE (5 MG/ML) 0.5% IN NEBU
INHALATION_SOLUTION | RESPIRATORY_TRACT | Status: AC
  Administered 2011-06-19: 20:00:00
  Filled 2011-06-19: qty 1

## 2011-06-19 MED ORDER — PHENYTOIN SODIUM EXTENDED 100 MG PO CAPS
200.0000 mg | ORAL_CAPSULE | Freq: Once | ORAL | Status: AC
  Administered 2011-06-19: 200 mg via ORAL
  Filled 2011-06-19: qty 2

## 2011-06-19 MED ORDER — ONDANSETRON HCL 4 MG/2ML IJ SOLN
INTRAMUSCULAR | Status: AC
  Administered 2011-06-19: 20:00:00
  Filled 2011-06-19: qty 2

## 2011-06-19 MED ORDER — CHLORPROMAZINE HCL 25 MG PO TABS
50.0000 mg | ORAL_TABLET | Freq: Once | ORAL | Status: AC
  Administered 2011-06-19: 50 mg via ORAL
  Filled 2011-06-19: qty 2

## 2011-06-19 NOTE — ED Notes (Addendum)
Per EMS pt transported from home after family reports seizure activity lasting at home. Family states they heard a fall @1830 . Pt A & O on arrival to ED. Neb tx given in route for reports of wheezing per EMS. 94% on RA. # 20 L FA by EMS. Zofran given, pt out of Dilantin and BP meds x 2 days

## 2011-06-19 NOTE — ED Notes (Signed)
[id number]<GN> Expected date:<BR> Expected time: 7:46 PM<BR> Means of arrival:<BR> Comments:<BR> M140 - 45yoM seizure with hx, out of dilantin

## 2011-06-19 NOTE — ED Notes (Signed)
Pt states he drinks a six pack a day for many years. Pt reports his last drink was two days ago

## 2011-06-19 NOTE — ED Provider Notes (Signed)
History     CSN: [id number]  Arrival date & time 06/19/11  4098   First MD Initiated Contact with Patient 06/19/11 2002      Chief Complaint  Patient presents with  . Seizures    (Consider location/radiation/quality/duration/timing/severity/associated sxs/prior treatment) HPI Comments: Patient is a seizure history states she's missed his meds for the last 2 days procedures didn't bother to pick him up from the pharmacy he also ran out of his blood pressure medicine but in the pharmacy is followed by outpatient clinic at [facility] he also has been having hiccups for the past day and they have been persistent today he has a history of the same has never taken any medication to control his symptoms denies any trauma from his seizure denies incontinence at this time he is alert and appropriate and answered all questions in full sentences  Patient is a 46 y.o. male presenting with seizures. The history is provided by the patient and a relative.  Seizures  This is a recurrent problem. The current episode started less than 1 hour ago. The problem has been gradually improving. There was 1 seizure. The most recent episode lasted more than 5 minutes. Pertinent negatives include no headaches, no speech difficulty, no cough, no nausea and no vomiting. Characteristics include rhythmic jerking. Possible causes include missed seizure meds. There has been no fever.    Past Medical History  Diagnosis Date  . Seizures   . Alcohol abuse     History reviewed. No pertinent past surgical history.  History reviewed. No pertinent family history.  History  Substance Use Topics  . Smoking status: Current Everyday Smoker -- 0.5 packs/day for 20 years    Types: Cigarettes  . Smokeless tobacco: Never Used  . Alcohol Use: Yes     six pack a day      Review of Systems  Constitutional: Negative for fever.  Respiratory: Negative for cough, shortness of breath and wheezing.   Gastrointestinal:  Negative for nausea and vomiting.  Skin: Negative for pallor.  Neurological: Positive for seizures. Negative for speech difficulty, weakness, numbness and headaches.    Allergies  Review of patient's allergies indicates no known allergies.  Home Medications   Current Outpatient Rx  Name Route Sig Dispense Refill  . FOLIC ACID 1 MG PO TABS Oral Take 1 tablet (1 mg total) by mouth daily. 30 tablet 2  . LISINOPRIL-HYDROCHLOROTHIAZIDE 10-12.5 MG PO TABS Oral Take 1 tablet by mouth daily.      Marland Kitchen OMEPRAZOLE 20 MG PO CPDR Oral Take 1 capsule (20 mg total) by mouth daily. 30 capsule 0  . PHENYTOIN SODIUM EXTENDED 100 MG PO CAPS Oral Take 100 mg by mouth 2 (two) times daily.      . THIAMINE HCL 50 MG PO TABS Oral Take 1 tablet (50 mg total) by mouth daily. 30 tablet 2  . PHENYTOIN SODIUM EXTENDED 200 MG PO CAPS Oral Take 1 capsule (200 mg total) by mouth daily. 60 capsule 0    BP 115/83  Pulse 106  Temp(Src) 98.8 F (37.1 C) (Oral)  Resp 19  SpO2 100%  Physical Exam  Constitutional: He is oriented to person, place, and time. He appears well-developed and well-nourished.  HENT:  Head: Normocephalic.  Eyes: Pupils are equal, round, and reactive to light.  Neck: Normal range of motion.  Cardiovascular: Normal rate.   Pulmonary/Chest: Effort normal. He has no wheezes. He exhibits no tenderness.       Hiccups  Musculoskeletal: Normal range of motion.  Neurological: He is alert and oriented to person, place, and time.  Skin: Skin is warm. No rash noted.    ED Course  Procedures (including critical care time)  Labs Reviewed  GLUCOSE, CAPILLARY - Abnormal; Notable for the following:    Glucose-Capillary 163 (*)    All other components within normal limits  CBC - Abnormal; Notable for the following:    RBC 3.86 (*)    HCT 36.5 (*)    All other components within normal limits  DIFFERENTIAL - Abnormal; Notable for the following:    Neutrophils Relative 78 (*)    All other components  within normal limits  URINALYSIS, ROUTINE W REFLEX MICROSCOPIC - Abnormal; Notable for the following:    APPearance CLOUDY (*)    Glucose, UA 250 (*)    Hgb urine dipstick LARGE (*)    Protein, ur >300 (*)    All other components within normal limits  PHENYTOIN LEVEL, TOTAL - Abnormal; Notable for the following:    Phenytoin Lvl <2.5 (*)    All other components within normal limits  POCT I-STAT, CHEM 8 - Abnormal; Notable for the following:    Potassium 3.3 (*)    BUN <3 (*)    Glucose, Bld 157 (*)    All other components within normal limits  URINE MICROSCOPIC-ADD ON - Abnormal; Notable for the following:    Casts HYALINE CASTS (*)    All other components within normal limits  ETHANOL   Dg Chest Port 1 View  06/19/2011  *RADIOLOGY REPORT*  Clinical Data: Seizure.  PORTABLE CHEST - 1 VIEW  Comparison: 05/14/2010  Findings: The lungs are clear without focal consolidation, edema, effusion or pneumothorax.  Cardiopericardial silhouette is within normal limits for size.  Imaged bony structures of the thorax are intact. Telemetry leads overlie the chest.  IMPRESSION: Hyperexpansion without acute findings.  Original Report Authenticated By: ERIC A. MANSELL, M.D.     1. Hiccups   2. Seizure   3. Noncompliance with medications    he was by mouth loaded with Dilantin given IV fluid offered detox for his alcohol abuse denies at this time heart rate was 98 at time of discharge patient was ambulated through the emergency department without assistance he is steady on his feet   MDM  The Dilantin level  Review,  he has not taken his medication for the past 2 days she reports in for his eyedrops        Arman Filter, NP 06/20/11 0327  Arman Filter, NP 06/20/11 [phone]

## 2011-06-20 MED ORDER — PHENYTOIN SODIUM EXTENDED 200 MG PO CAPS: 200.0000 mg | ORAL_CAPSULE | Freq: Every day | ORAL | Status: DC

## 2011-06-20 MED ORDER — SODIUM CHLORIDE 0.9 % IV BOLUS (SEPSIS)
1000.0000 mL | Freq: Once | INTRAVENOUS | Status: AC
  Administered 2011-06-20: 1000 mL via INTRAVENOUS

## 2011-06-20 MED ORDER — PHENYTOIN SODIUM EXTENDED 100 MG PO CAPS
200.0000 mg | ORAL_CAPSULE | Freq: Once | ORAL | Status: AC
Start: 2011-06-20 — End: 2011-06-20
  Administered 2011-06-20: 200 mg via ORAL
  Filled 2011-06-20: qty 2

## 2011-06-20 NOTE — ED Provider Notes (Signed)
Medical screening examination/treatment/procedure(s) were performed by non-physician practitioner and as supervising physician I was immediately available for consultation/collaboration.  Sunnie Nielsen, MD 06/20/11 [phone]

## 2011-06-20 NOTE — Discharge Instructions (Signed)
Measures taken medication on a daily basis please make an appointment Dr. Audley Hose for followup

## 2011-07-04 ENCOUNTER — Ambulatory Visit (INDEPENDENT_AMBULATORY_CARE_PROVIDER_SITE_OTHER): Payer: Self-pay | Admitting: Internal Medicine

## 2011-07-04 ENCOUNTER — Encounter: Payer: Self-pay | Admitting: Internal Medicine

## 2011-07-04 VITALS — BP 118/79 | HR 126 | Temp 99.9°F

## 2011-07-04 DIAGNOSIS — R569 Unspecified convulsions: Secondary | ICD-10-CM

## 2011-07-04 DIAGNOSIS — F1020 Alcohol dependence, uncomplicated: Secondary | ICD-10-CM

## 2011-07-04 DIAGNOSIS — R066 Hiccough: Secondary | ICD-10-CM

## 2011-07-04 DIAGNOSIS — I10 Essential (primary) hypertension: Secondary | ICD-10-CM

## 2011-07-04 MED ORDER — CHLORPROMAZINE HCL 10 MG PO TABS: 10.0000 mg | ORAL_TABLET | Freq: Three times a day (TID) | ORAL | Status: DC | PRN

## 2011-07-04 MED ORDER — RANITIDINE HCL 150 MG PO TABS: 150.0000 mg | ORAL_TABLET | Freq: Two times a day (BID) | ORAL | Status: DC

## 2011-07-04 MED ORDER — FOLIC ACID 1 MG PO TABS: 1.0000 mg | ORAL_TABLET | Freq: Every day | ORAL | Status: DC

## 2011-07-04 NOTE — Assessment & Plan Note (Signed)
Unclear history of seizure disorder however has been on seizure medications for some time. Does have seizures that taken to the emergency department when he stopped taking his medications. Did encourage him to continue taking his medications every day. No changes to dosing and did clarify that he takes Dilantin 100 mg 3 times daily and has for some time.

## 2011-07-04 NOTE — Assessment & Plan Note (Signed)
Patient is still drinking, and does continue to take folic acid and thiamine on a daily basis. Did encourage him to reduce the amount of alcohol he is intaking every day.

## 2011-07-04 NOTE — Patient Instructions (Signed)
You were seen today for a follow up of an ER visit for seizures. You are doing well on taking your medicine. We will give you thorazine for your hiccups. Use this to stop a hiccup attack and no more than 3 pills in a day. The ranitidine should be taken in the morning and at night for 1 week to try to reduce the amount of hiccup attacks you have. No other changes to your medicines. If you need to come back sooner than 3 months feel free to call our clinic at [phone]. If you are healthy we will see you back in 3 months.

## 2011-07-04 NOTE — Assessment & Plan Note (Addendum)
Did give him small prescription for Thorazine to trial as well as ranitidine to trial using twice daily to see if this reduces the amount of hiccup episodes he does have. If in 2-4 weeks he is still having lots of hiccup episodes it might be reasonable to get a chest CT on him to rule out possible malignancy as he is a smoker.

## 2011-07-04 NOTE — Assessment & Plan Note (Addendum)
Blood pressure is well-controlled today at 118/79. No change to his medical regimen at today's visit. Did encourage him to continue taking this medication every day. He has been doing a good job lately.

## 2011-07-04 NOTE — Progress Notes (Signed)
Subjective:     Patient ID: Joseph Underwood, male   DOB: May 09, 1965, 46 y.o.   MRN: [id number]  HPI The patient is a 46 year old male with past medical history of alcohol dependence and seizure disorder. He has also been having repeated bouts of hiccups in the past several weeks. He was trialed on ranitidine in January which Dr. Larey Seat stated was not effective. However he was recently given some in the emergency department which helped a little bit. He would like to get a repeat prescription for that today. In the past he has not been taking his medications for his seizure disorder or his blood pressure. In recent weeks he has been starting to take his medications again. He has been seizure free since emergency department visit on May 5. He has been taking his medications faithfully since then. He would like to trial something for his hiccups. He states that these hiccup bouts can occasionally cause him to lose his breath he hiccups so much.  Review of Systems  Constitutional: Negative.   HENT: Negative.   Eyes: Negative.   Respiratory: Negative.  Negative for cough, choking, chest tightness, shortness of breath and wheezing.   Cardiovascular: Negative for chest pain, palpitations and leg swelling.  Gastrointestinal: Negative.  Negative for nausea, vomiting, abdominal pain, diarrhea, constipation and abdominal distention.  Genitourinary: Negative.   Musculoskeletal: Negative.   Neurological: Negative for dizziness, tremors, seizures, syncope, facial asymmetry, speech difficulty, weakness, light-headedness, numbness and headaches.       Hiccups  Hematological: Negative.   Psychiatric/Behavioral: Negative.     Vitals: Blood pressure: 118/79 Pulse: 126 Temperature: 99.50F    Objective:   Physical Exam  Constitutional: He is oriented to person, place, and time. He appears well-developed and well-nourished. No distress.  HENT:  Head: Normocephalic and atraumatic.  Eyes: EOM are normal.  Pupils are equal, round, and reactive to light.  Neck: Normal range of motion. Neck supple.  Cardiovascular: Regular rhythm.        Slightly tachycardic.  Pulmonary/Chest: Effort normal and breath sounds normal. No respiratory distress.  Abdominal: Soft. Bowel sounds are normal. He exhibits no distension. There is no tenderness.  Musculoskeletal: Normal range of motion.  Neurological: He is alert and oriented to person, place, and time.  Skin: Skin is warm and dry. He is not diaphoretic.       Assessment/Plan:   1. Please see problem-oriented charting  2. Disposition-patient will be seen back in 3 months or as needed if sooner. Did prescribe Thorazine to be used as needed for headache up bouts and ranitidine to be used twice daily to prevent takeup bouts. Did explain to him the use of these medications. Did also give him written format of this information. Did encourage him to continue taking his medications as prescribed in the future and to call us if he is feeling poorly. Did also refill his blood gas a prescription today. We'll see him back in 3 months.

## 2011-07-18 ENCOUNTER — Ambulatory Visit (INDEPENDENT_AMBULATORY_CARE_PROVIDER_SITE_OTHER): Payer: Self-pay | Admitting: Internal Medicine

## 2011-07-18 ENCOUNTER — Encounter: Payer: Self-pay | Admitting: Internal Medicine

## 2011-07-18 VITALS — BP 110/63 | HR 94 | Temp 98.7°F | Resp 20 | Ht 64.5 in | Wt 115.5 lb

## 2011-07-18 DIAGNOSIS — R569 Unspecified convulsions: Secondary | ICD-10-CM

## 2011-07-18 DIAGNOSIS — R066 Hiccough: Secondary | ICD-10-CM

## 2011-07-18 DIAGNOSIS — I10 Essential (primary) hypertension: Secondary | ICD-10-CM

## 2011-07-18 DIAGNOSIS — F1020 Alcohol dependence, uncomplicated: Secondary | ICD-10-CM

## 2011-07-18 MED ORDER — LISINOPRIL 10 MG PO TABS: 10.0000 mg | ORAL_TABLET | Freq: Every day | ORAL | Status: DC

## 2011-07-18 NOTE — Assessment & Plan Note (Signed)
Blood pressure has been well controlled recently, but is complaining of orthostatic symptoms. In January it was thought secondary to alcohol abuse. Today his blood pressure is low for him, and he does not describe any recent weight loss or diet modification. His blood pressure is 110/63 while sitting and 106/60 while standing, so he has not orthostatic at this time. Nonetheless, I do think his medicines are the likely culprit for his orthostasis. - Decrease HCTZ/lisinopril to just lisinopril 10 mg daily - Call in 1-2 weeks to see if he is still dizzy, can titrate up the phone since the patient is self-pay

## 2011-07-18 NOTE — Assessment & Plan Note (Signed)
Says he has good compliance with Dilantin 100 mg 3 times daily, but prior Dilantin levels have been low. We will recheck a Dilantin level today. Last seizure was reportedly 3 weeks ago. - Followup Dilantin level

## 2011-07-18 NOTE — Progress Notes (Signed)
Subjective:     Patient ID: Joseph Underwood, male   DOB: [birth year], 46 y.o.   MRN: [id number]  HPI Patient is a very pleasant 46 year old man with history of alcohol abuse, seizure disorder NOS, hiccups, and hypertension who presents for followup.  The patient was seen a few weeks ago for hiccups and was prescribed Thorazine and ranitidine. He has not filled those prescriptions because of dizziness that has been bothering him. He was seen back in January for dizziness, but it seemed to improve on its on. He is describing orthostatic type symptoms with feeling dizzy when standing up after bending over. He takes his HCTZ/lisinopril, pale with good compliance. He also takes Dilantin 3 times daily. Because of the dizziness, he did not start taking his medicines.  Review of Systems No chest pain, no palpitations    Objective:   Physical Exam GEN: NAD.  Alert and oriented x 3.  Pleasant, conversant, and cooperative to exam. RESP:  CTAB, no w/r/r CARDIOVASCULAR: RRR, S1, S2, no m/r/g ABDOMEN: soft, NT/ND, NABS EXT: warm and dry. No edema in b/l LE     Assessment:         Plan:

## 2011-07-18 NOTE — Assessment & Plan Note (Signed)
Patient continues to drink, see prior assessment and plan for further details. His mother was present in the room today, he was reluctant to talk about his alcohol abuse the

## 2011-07-18 NOTE — Assessment & Plan Note (Signed)
Patient did not start using Thorazine or ranitidine because of dizziness, and his hiccups are still at baseline. If his dizziness improves with blood pressure medicine titration, he will consider using these medicines. As described in prior progress notes, it may be reasonable to get a chest CT to rule out malignancy (smoker), if the hiccups continue. Nonetheless, a trial of medicine would be warranted prior to this.

## 2011-07-19 ENCOUNTER — Other Ambulatory Visit: Payer: Self-pay | Admitting: Internal Medicine

## 2011-07-19 LAB — PHENYTOIN LEVEL, TOTAL: Phenytoin Lvl: <0.5 ug/mL — ABNORMAL LOW (ref 10.0–20.0)

## 2011-07-19 MED ORDER — PHENYTOIN SODIUM EXTENDED 300 MG PO CAPS: 300.0000 mg | ORAL_CAPSULE | Freq: Three times a day (TID) | ORAL | Status: DC

## 2011-07-27 ENCOUNTER — Inpatient Hospital Stay (HOSPITAL_COMMUNITY)
Admission: EM | Admit: 2011-07-27 | Discharge: 2011-07-29 | DRG: 683 | Disposition: A | Discharge status: Home / Self Care | Payer: Self-pay | Attending: Internal Medicine | Admitting: Internal Medicine

## 2011-07-27 ENCOUNTER — Emergency Department (HOSPITAL_COMMUNITY): Payer: Self-pay

## 2011-07-27 ENCOUNTER — Encounter (HOSPITAL_COMMUNITY): Payer: Self-pay | Admitting: Emergency Medicine

## 2011-07-27 DIAGNOSIS — M47812 Spondylosis without myelopathy or radiculopathy, cervical region: Secondary | ICD-10-CM | POA: Diagnosis present

## 2011-07-27 DIAGNOSIS — M6282 Rhabdomyolysis: Secondary | ICD-10-CM | POA: Diagnosis present

## 2011-07-27 DIAGNOSIS — E872 Acidosis, unspecified: Secondary | ICD-10-CM | POA: Diagnosis present

## 2011-07-27 DIAGNOSIS — Z9119 Patient's noncompliance with other medical treatment and regimen: Secondary | ICD-10-CM

## 2011-07-27 DIAGNOSIS — F1010 Alcohol abuse, uncomplicated: Secondary | ICD-10-CM | POA: Diagnosis present

## 2011-07-27 DIAGNOSIS — Z91199 Patient's noncompliance with other medical treatment and regimen due to unspecified reason: Secondary | ICD-10-CM

## 2011-07-27 DIAGNOSIS — R066 Hiccough: Secondary | ICD-10-CM | POA: Diagnosis present

## 2011-07-27 DIAGNOSIS — F1020 Alcohol dependence, uncomplicated: Secondary | ICD-10-CM

## 2011-07-27 DIAGNOSIS — N289 Disorder of kidney and ureter, unspecified: Secondary | ICD-10-CM

## 2011-07-27 DIAGNOSIS — F17200 Nicotine dependence, unspecified, uncomplicated: Secondary | ICD-10-CM | POA: Diagnosis present

## 2011-07-27 DIAGNOSIS — I10 Essential (primary) hypertension: Secondary | ICD-10-CM | POA: Diagnosis present

## 2011-07-27 DIAGNOSIS — R42 Dizziness and giddiness: Secondary | ICD-10-CM

## 2011-07-27 DIAGNOSIS — N179 Acute kidney failure, unspecified: Principal | ICD-10-CM | POA: Diagnosis present

## 2011-07-27 DIAGNOSIS — R569 Unspecified convulsions: Secondary | ICD-10-CM | POA: Diagnosis present

## 2011-07-27 DIAGNOSIS — E876 Hypokalemia: Secondary | ICD-10-CM | POA: Diagnosis present

## 2011-07-27 DIAGNOSIS — G40909 Epilepsy, unspecified, not intractable, without status epilepticus: Secondary | ICD-10-CM | POA: Diagnosis present

## 2011-07-27 DIAGNOSIS — M542 Cervicalgia: Secondary | ICD-10-CM

## 2011-07-27 DIAGNOSIS — I959 Hypotension, unspecified: Secondary | ICD-10-CM | POA: Diagnosis present

## 2011-07-27 HISTORY — DX: Hiccough: R06.6

## 2011-07-27 HISTORY — DX: Essential (primary) hypertension: I10

## 2011-07-27 LAB — BLOOD GAS, ARTERIAL
Acid-base deficit: 1.6 mmol/L (ref 0.0–2.0)
Drawn by: 29017
FIO2: 0.21 %
pCO2 arterial: 35.2 mmHg (ref 35.0–45.0)
pH, Arterial: 7.417 (ref 7.350–7.450)

## 2011-07-27 LAB — COMPREHENSIVE METABOLIC PANEL
AST: 31 U/L (ref 0–37)
BUN: 36 mg/dL — ABNORMAL HIGH (ref 6–23)
CO2: 25 mEq/L (ref 19–32)
Calcium: 8.5 mg/dL (ref 8.4–10.5)
Chloride: 88 mEq/L — ABNORMAL LOW (ref 96–112)
Creatinine, Ser: 4.14 mg/dL — ABNORMAL HIGH (ref 0.50–1.35)
GFR calc Af Amer: 19 mL/min — ABNORMAL LOW (ref >90)
GFR calc non Af Amer: 16 mL/min — ABNORMAL LOW (ref >90)
Glucose, Bld: 98 mg/dL (ref 70–99)
Total Bilirubin: 0.4 mg/dL (ref 0.3–1.2)

## 2011-07-27 LAB — URINALYSIS, ROUTINE W REFLEX MICROSCOPIC
Nitrite: NEGATIVE
Protein, ur: 100 mg/dL — AB
Urobilinogen, UA: 0.2 mg/dL (ref 0.0–1.0)

## 2011-07-27 LAB — PROTIME-INR
INR: 1 (ref 0.00–1.49)
Prothrombin Time: 13.4 seconds (ref 11.6–15.2)

## 2011-07-27 LAB — DIFFERENTIAL
Eosinophils Relative: 0 % (ref 0–5)
Lymphocytes Relative: 16 % (ref 12–46)
Lymphs Abs: 1.8 10*3/uL (ref 0.7–4.0)
Monocytes Absolute: 0.8 10*3/uL (ref 0.1–1.0)
Monocytes Relative: 7 % (ref 3–12)
Neutro Abs: 8.8 10*3/uL — ABNORMAL HIGH (ref 1.7–7.7)

## 2011-07-27 LAB — ETHANOL: Alcohol, Ethyl (B): <11 mg/dL (ref 0–11)

## 2011-07-27 LAB — CBC
HCT: 36.3 % — ABNORMAL LOW (ref 39.0–52.0)
Hemoglobin: 13.1 g/dL (ref 13.0–17.0)
MCV: 93.8 fL (ref 78.0–100.0)
WBC: 11.5 10*3/uL — ABNORMAL HIGH (ref 4.0–10.5)

## 2011-07-27 LAB — MAGNESIUM: Magnesium: 0.7 mg/dL — CL (ref 1.5–2.5)

## 2011-07-27 LAB — URINE MICROSCOPIC-ADD ON

## 2011-07-27 LAB — CARDIAC PANEL(CRET KIN+CKTOT+MB+TROPI): Total CK: 259 U/L — ABNORMAL HIGH (ref 7–232)

## 2011-07-27 LAB — HEMOGLOBIN A1C: Mean Plasma Glucose: 105 mg/dL (ref <117)

## 2011-07-27 LAB — LACTIC ACID, PLASMA: Lactic Acid, Venous: 0.9 mmol/L (ref 0.5–2.2)

## 2011-07-27 MED ORDER — SODIUM CHLORIDE 0.9 % IV BOLUS (SEPSIS)
1000.0000 mL | Freq: Once | INTRAVENOUS | Status: AC
  Administered 2011-07-27: 1000 mL via INTRAVENOUS

## 2011-07-27 MED ORDER — ADULT MULTIVITAMIN W/MINERALS CH
1.0000 | ORAL_TABLET | Freq: Every day | ORAL | Status: DC
  Administered 2011-07-27 – 2011-07-29 (×3): 1 via ORAL
  Filled 2011-07-27 (×3): qty 1

## 2011-07-27 MED ORDER — LORAZEPAM 2 MG/ML IJ SOLN: 1.0000 mg | Freq: Four times a day (QID) | INTRAMUSCULAR | Status: DC | PRN

## 2011-07-27 MED ORDER — SODIUM CHLORIDE 0.9 % IV BOLUS (SEPSIS)
250.0000 mL | Freq: Once | INTRAVENOUS | Status: AC
  Administered 2011-07-27: 250 mL via INTRAVENOUS

## 2011-07-27 MED ORDER — MAGNESIUM SULFATE 40 MG/ML IJ SOLN
4.0000 g | Freq: Once | INTRAMUSCULAR | Status: AC
  Administered 2011-07-27: 4 g via INTRAVENOUS
  Filled 2011-07-27: qty 100

## 2011-07-27 MED ORDER — SODIUM CHLORIDE 0.9 % IJ SOLN: 3.0000 mL | Freq: Two times a day (BID) | INTRAMUSCULAR | Status: DC

## 2011-07-27 MED ORDER — SODIUM CHLORIDE 0.9 % IV SOLN
600.0000 mg | Freq: Once | INTRAVENOUS | Status: AC
  Administered 2011-07-27: 600 mg via INTRAVENOUS
  Filled 2011-07-27: qty 12

## 2011-07-27 MED ORDER — CYCLOBENZAPRINE HCL 10 MG PO TABS: 5.0000 mg | ORAL_TABLET | Freq: Three times a day (TID) | ORAL | Status: DC | PRN

## 2011-07-27 MED ORDER — THIAMINE HCL 100 MG/ML IJ SOLN
100.0000 mg | Freq: Every day | INTRAMUSCULAR | Status: DC
  Filled 2011-07-27 (×3): qty 1

## 2011-07-27 MED ORDER — VITAMIN B-1 100 MG PO TABS
100.0000 mg | ORAL_TABLET | Freq: Every day | ORAL | Status: DC
  Administered 2011-07-27 – 2011-07-29 (×3): 100 mg via ORAL
  Filled 2011-07-27 (×3): qty 1

## 2011-07-27 MED ORDER — LORAZEPAM 1 MG PO TABS
1.0000 mg | ORAL_TABLET | Freq: Four times a day (QID) | ORAL | Status: DC | PRN
Start: 2011-07-27 — End: 2011-07-29

## 2011-07-27 MED ORDER — ENOXAPARIN SODIUM 30 MG/0.3ML ~~LOC~~ SOLN
30.0000 mg | SUBCUTANEOUS | Status: DC
  Administered 2011-07-27 – 2011-07-28 (×2): 30 mg via SUBCUTANEOUS
  Filled 2011-07-27 (×3): qty 0.3

## 2011-07-27 MED ORDER — SODIUM CHLORIDE 0.9 % IV SOLN
INTRAVENOUS | Status: DC
  Administered 2011-07-27 – 2011-07-29 (×3): via INTRAVENOUS

## 2011-07-27 MED ORDER — FOLIC ACID 1 MG PO TABS
1.0000 mg | ORAL_TABLET | Freq: Every day | ORAL | Status: DC
  Administered 2011-07-27 – 2011-07-29 (×3): 1 mg via ORAL
  Filled 2011-07-27 (×3): qty 1

## 2011-07-27 NOTE — ED Provider Notes (Signed)
History     CSN: [id number]  Arrival date & time 07/27/11  1308   First MD Initiated Contact with Patient 07/27/11 1453      Chief Complaint  Patient presents with  . Dizziness  . Near Syncope    (Consider location/radiation/quality/duration/timing/severity/associated sxs/prior treatment) The history is provided by the patient.   patient's had episodes of dizziness for the last 2 months. He states today he has been having worsening of the symptoms. States it the episodes start with pain in his neck bilaterally and becomes dizzy and feels like he'll pass out. Patient states she has not passed out but has fallen out. Patient is a somewhat heavy drinker, but has not drank in the last 3 days. No chest pain. No changes in appetite. No nausea vomiting diarrhea. No fevers. No dysuria. No abdominal pain. Occasional headaches. He is seen in internal medicine teaching clinic.  Past Medical History  Diagnosis Date  . Seizures   . Alcohol abuse   . Hiccups   . HTN (hypertension)     History reviewed. No pertinent past surgical history.  History reviewed. No pertinent family history.  History  Substance Use Topics  . Smoking status: Current Everyday Smoker -- 0.3 packs/day for 20 years    Types: Cigarettes  . Smokeless tobacco: Never Used  . Alcohol Use: Yes     six pack a day      Review of Systems  Constitutional: Positive for fatigue. Negative for activity change and appetite change.  HENT: Negative for neck stiffness.   Eyes: Negative for pain.  Respiratory: Negative for chest tightness and shortness of breath.   Cardiovascular: Negative for chest pain and leg swelling.  Gastrointestinal: Negative for nausea, vomiting, abdominal pain and diarrhea.  Genitourinary: Negative for flank pain.  Musculoskeletal: Negative for back pain.       Neck pain  Skin: Negative for rash.  Neurological: Positive for dizziness. Negative for weakness, numbness and headaches.    Psychiatric/Behavioral: Negative for behavioral problems.    Allergies  Review of patient's allergies indicates no known allergies.  Home Medications   No current outpatient prescriptions on file.  BP 97/54  Pulse 96  Temp 98.4 F (36.9 C) (Oral)  Resp 20  SpO2 98%  Physical Exam  Nursing note and vitals reviewed. Constitutional: He is oriented to person, place, and time. He appears well-developed and well-nourished.  HENT:  Head: Normocephalic and atraumatic.  Eyes: EOM are normal. Pupils are equal, round, and reactive to light.  Neck: Normal range of motion. Neck supple.  Cardiovascular: Regular rhythm and normal heart sounds.   No murmur heard.      Mild tachycardia.  Pulmonary/Chest: Effort normal and breath sounds normal.  Abdominal: Soft. Bowel sounds are normal. He exhibits no distension and no mass. There is no tenderness. There is no rebound and no guarding.  Musculoskeletal: Normal range of motion. He exhibits no edema.  Neurological: He is alert and oriented to person, place, and time. No cranial nerve deficit.  Skin: Skin is warm and dry.  Psychiatric: He has a normal mood and affect.    ED Course  Procedures (including critical care time)  Labs Reviewed  CBC - Abnormal; Notable for the following:    WBC 11.5 (*)     RBC 3.87 (*)     HCT 36.3 (*)     MCHC 36.1 (*)     All other components within normal limits  DIFFERENTIAL - Abnormal; Notable for the  following:    Neutro Abs 8.8 (*)     All other components within normal limits  COMPREHENSIVE METABOLIC PANEL - Abnormal; Notable for the following:    Chloride 88 (*)     BUN 36 (*)     Creatinine, Ser 4.14 (*)     Total Protein 8.7 (*)     GFR calc non Af Amer 16 (*)     GFR calc Af Amer 19 (*)     All other components within normal limits  URINALYSIS, ROUTINE W REFLEX MICROSCOPIC - Abnormal; Notable for the following:    Hgb urine dipstick MODERATE (*)     Bilirubin Urine SMALL (*)     Protein,  ur 100 (*)     All other components within normal limits  PHENYTOIN LEVEL, TOTAL - Abnormal; Notable for the following:    Phenytoin Lvl 3.1 (*)     All other components within normal limits  URINE MICROSCOPIC-ADD ON - Abnormal; Notable for the following:    Squamous Epithelial / LPF FEW (*)     Casts GRANULAR CAST (*)  HYALINE CASTS   All other components within normal limits  MAGNESIUM - Abnormal; Notable for the following:    Magnesium 0.7 (*)     All other components within normal limits  PHOSPHORUS - Abnormal; Notable for the following:    Phosphorus 6.2 (*)     All other components within normal limits  CARDIAC PANEL(CRET KIN+CKTOT+MB+TROPI) - Abnormal; Notable for the following:    Total CK 259 (*)     All other components within normal limits  TROPONIN I  TSH  APTT  PROTIME-INR  HEMOGLOBIN A1C  ETHANOL  LACTIC ACID, PLASMA  BLOOD GAS, ARTERIAL  URINE RAPID DRUG SCREEN (HOSP PERFORMED)  CARDIAC PANEL(CRET KIN+CKTOT+MB+TROPI)  CREATININE, URINE, RANDOM  SODIUM, URINE, RANDOM  UREA NITROGEN, URINE  OSMOLALITY  BASIC METABOLIC PANEL  CBC  PHENYTOIN LEVEL, TOTAL  VITAMIN B12  CARDIAC PANEL(CRET KIN+CKTOT+MB+TROPI)   Dg Chest 2 View  07/27/2011  *RADIOLOGY REPORT*  Clinical Data: Weakness, chest pain, smoking history  CHEST - 2 VIEW  Comparison: Portable chest x-ray of 06/19/2011  Findings: The lungs are clear and slightly hyperaerated. Mediastinal contours appear stable.  The heart is within normal limits in size.  No bony abnormality is seen.  IMPRESSION: No active lung disease.  No change in slight hyperaeration.  Original Report Authenticated By: Juline Patch, M.D.     1. Renal insufficiency   2. Hypotension   3. Acute renal failure   4. Lightheadedness   5. Metabolic acidosis   6. Other and unspecified alcohol dependence, unspecified drinking behavior   7. Other convulsions      Date: 07/27/2011  Rate:102  Rhythm: sinus tachycardia  QRS Axis: right   Intervals: normal  ST/T Wave abnormalities: nonspecific ST/T changes  Conduction Disutrbances:none  Narrative Interpretation: T wave inversions inferiorly and laterally. Nonspecific ST changes.  Old EKG Reviewed: changes noted    MDM  Patient with hypotension and worsening renal function. Hypotension resolved some treatment. He has been a heavy drinker. He'll be admitted to medicine for further evaluation treatment         Juliet Rude. Rubin Payor, MD 07/28/11 0003

## 2011-07-27 NOTE — ED Notes (Signed)
Report given to [employer]. Will transport to 6700 after lab draw

## 2011-07-27 NOTE — ED Notes (Addendum)
Pt sts became dizzy with near syncopal episode while at work; pt sts started with pain in neck into back then became dizzy pt denies LOC; pt sts some blurry vision; pt hypotensive; pt sts drinks regularly but has not drank for 3 days

## 2011-07-27 NOTE — ED Notes (Signed)
Patient denies pain and is resting comfortably.  

## 2011-07-27 NOTE — Progress Notes (Signed)
CRITICAL VALUE ALERT  Critical value received: mag 0.7  Date of notification:  07/27/11  Time of notification:  1845  Critical value read back:yes  Nurse who received alert:  Mervin Hack rn  MD notified (1st page): pager [phone] Time of first page:  [phone]  MD notified (2nd page):  Time of second page:1930  Responding MD:  Dr Tonny Branch  Time MD responded:  [phone]

## 2011-07-27 NOTE — H&P (Signed)
Hospital Admission Note Date: 07/28/2011  Patient name: Joseph Underwood Medical record number: [id number] Date of birth: Nov 01, 1965 Age: 46 y.o. Gender: male PCP: Genella Mech, MD  Medical Service: IMTS  Attending physician:  Dr. Margarito Liner       First Contact:             Dr. Clyde Lundborg                         Pager: [phone] Second Contact: Dr. Loistine Chance                      Pager: [phone]     After Hours: First Contact:              Pager: [phone] Second Contact:         Pager: [phone]   Chief Complaint: dizziness and neck pain.  History of Present Illness:  Patient is a 46 year old male with a past medical history of seizure disorder, hypertension, and alcohol abuse, who presents with dizziness and neck pain.  Per patient, he started having dizziness and neck pain since  several months ago. Two symptoms almost always come together. It is intermittent, happens every 3-4 days, each time lasts for about 1 to 2 days. His neck pain is located at the posterior neck, 8/10 in severity when it happens, it is aching-like pain and nonradiating. It is not aggravated or alleviated with any factors. Patient denies any chest pain, shortness of breath, palpitation, ear pain, ear ringing, injury or sick contacts. His dizziness has been progressively become worse.  Of note,  Patient's medication for HTN was adjusted in clinic on June 3. In that visit his bp was 110/63. His HCTZ/lisinopril was changed to Lisinopril only. However, today in ED, he still has HCTZ/lisinopril pill at his hand. He reports that he is still taking the same pills.   Patient also reported that she had four bowel moments with a loose stool yesterday, no blood in it. He also had nausea and vomited twice yesterday. But he did not have abdominal pain. Currently he dos not have nausea and vomiting mor abdominal pain.  Denies fever, chills,headaches,  cough, chest pain, SOB,  abdominal pain, constipation, dysuria, urgency, frequency,  hematuria, joint pain or leg swelling.  Meds:  Medications Prior to Admission  Medication Sig Dispense Refill  . chlorproMAZINE (THORAZINE) 10 MG tablet Take 1 tablet (10 mg total) by mouth 3 (three) times daily as needed.  15 tablet  0  . lisinopril (PRINIVIL,ZESTRIL) 10 MG tablet Take 1 tablet (10 mg total) by mouth daily.  30 tablet  11  . phenytoin (DILANTIN) 300 MG ER capsule Take 1 capsule (300 mg total) by mouth 3 (three) times daily.  30 capsule  3  . ranitidine (ZANTAC) 150 MG tablet Take 1 tablet (150 mg total) by mouth 2 (two) times daily.  60 tablet  1   Allergies: Allergies as of 07/27/2011  . (No Known Allergies)   Past Medical History  Diagnosis Date  . Seizures   . Alcohol abuse   . Hiccups   . HTN (hypertension)     Family HX:   Mother: Tetrology of Fallot, HTN and DM-II Father: cirrhosis 2/2 alcohol abuse 4 brothers: healthy.  Social History  . Marital Status: Single, lives with his mother at [employer]. Has 2 healthy sons    Spouse Name: N/A    Number of Children: N/A  .  Years of Education: N/A   Occupational History  . Not on file.   Social History Main Topics  . Smoking status: Current Everyday Smoker -- 0.3 packs/day for 20 years    Types: Cigarettes  . Smokeless tobacco: Never Used  . Alcohol Use: Yes      Currently drinks 80 Proof of Gin once a week, last drink was 2 days ago.  . Drug Use: No  . Sexually Active: Not on file   Review of Systems: as per HPI  Physical Exam:  Blood pressure 110/40, pulse 97, temperature (37.2 C), temperature source Oral, resp. rate 18, SpO2 99.00%.   Vitals: T: 99 F      HR: 97       BP: 110/40       RR:18    O2 saturation: 99% General: resting in bed, not in acute distress. Looks anxious.  HEENT: dry mucous and membrane, PERRL, EOMI, no scleral icterus, no JVD or Bruit. Cardiac: S1/S2, RRR, 2/6 systolic murmurs best heard at mitral valve area,  No gallops or rubs Pulm: Good air movement bilaterally,  Clear to auscultation bilaterally, No rales, wheezing, rhonchi or rubs. Abd: Soft,  nondistended, nontender, no rebound pain, no organomegaly, BS present Ext: No rashes or edema, 2+DP/PT pulse bilaterally.  Musculoskeletal: There is tenderness over paraspinal area in the neck posteriorly. There is no motion limitations. Skin: no rashes. No skin bruise. Neuro: alert and oriented X3, cranial nerves II-XII grossly intact, muscle strength 5/5 in all extremeties,  sensation to light touch intact. Normal finger to nose test. Has hand shaking when asked to stretching our his hands. Psych.: patient is not psychotic, no suicidal or hemocidal ideation.  Lab results: Basic Metabolic Panel:  Basename 07/28/11 0119 07/27/11 1744 07/27/11 1359  NA 130* -- 135  K 2.8* -- 3.7  CL 92* -- 88*  CO2 22 -- 25  GLUCOSE 96 -- 98  BUN 40* -- 36*  CREATININE 2.51* -- 4.14*  CALCIUM 7.1* -- 8.5  MG -- 0.7* --  PHOS -- 6.2* --   Liver Function Tests:  Basename 07/27/11 1359  AST 31  ALT 19  ALKPHOS 61  BILITOT 0.4  PROT 8.7*  ALBUMIN 4.5   No results found for this basename: LIPASE:2,AMYLASE:2 in the last 72 hours No results found for this basename: AMMONIA:2 in the last 72 hours CBC:  Basename 07/28/11 0119 07/27/11 1359  WBC 10.4 11.5*  NEUTROABS -- 8.8*  HGB 10.4* 13.1  HCT 28.8* 36.3*  MCV 92.9 93.8  PLT 243 312   Cardiac Enzymes:  Basename 07/28/11 0119 07/27/11 1742 07/27/11 1502  CKTOTAL 251* 259* --  CKMB 2.4 2.2 --  CKMBINDEX -- -- --  TROPONINI <0.30 <0.30 <0.30   BNP: No results found for this basename: PROBNP:3 in the last 72 hours D-Dimer: No results found for this basename: DDIMER:2 in the last 72 hours CBG: No results found for this basename: GLUCAP:6 in the last 72 hours Hemoglobin A1C:  Basename 07/27/11 1744  HGBA1C 5.3   Fasting Lipid Panel: No results found for this basename: CHOL,HDL,LDLCALC,TRIG,CHOLHDL,LDLDIRECT in the last 72 hours Thyroid Function  Tests:  Basename 07/27/11 1744  TSH 0.809  T4TOTAL --  FREET4 --  T3FREE --  THYROIDAB --   Anemia Panel: No results found for this basename: VITAMINB12,FOLATE,FERRITIN,TIBC,IRON,RETICCTPCT in the last 72 hours Coagulation:    Basename 07/27/11 1744  LABPROT 13.4  INR 1.00   Urine Drug Screen: Drugs of Abuse  Component Value Date/Time   LABOPIA POSITIVE* 10/09/2010 1353   COCAINSCRNUR NONE DETECTED 10/09/2010 1353   LABBENZ NONE DETECTED 10/09/2010 1353   AMPHETMU NONE DETECTED 10/09/2010 1353   THCU NONE DETECTED 10/09/2010 1353   LABBARB NONE DETECTED 10/09/2010 1353    Alcohol Level:  Basename 07/27/11 1744  ETH <11   Urinalysis:  Basename 07/27/11 1648  COLORURINE YELLOW  LABSPEC 1.020  PHURINE 5.5  GLUCOSEU NEGATIVE  HGBUR MODERATE*  BILIRUBINUR SMALL*  KETONESUR NEGATIVE  PROTEINUR 100*  UROBILINOGEN 0.2  NITRITE NEGATIVE  LEUKOCYTESUR NEGATIVE   Misc. Labs:   Imaging results:  Dg Chest 2 View  07/27/2011  *RADIOLOGY REPORT*  Clinical Data: Weakness, chest pain, smoking history  CHEST - 2 VIEW  Comparison: Portable chest x-ray of 06/19/2011  Findings: The lungs are clear and slightly hyperaerated. Mediastinal contours appear stable.  The heart is within normal limits in size.  No bony abnormality is seen.  IMPRESSION: No active lung disease.  No change in slight hyperaeration.  Original Report Authenticated By: Juline Patch, M.D.    Other results:  EKG: Sinus rhythm, right axis deviation, T wave inversion in inferior leads and V4 to V6. Biatrial enlargement. Possible ST elevation in V2 and V3.  Assessment & Plan by Problem:  #. Dizziness: Etiology is not clear at this moment. It is likely due to orthostatic status or comparatively low blood pressure. Patient's hypertension medication has been adjusted to on June 3. He supposed to take lisinopril only. However he still have hydrochlorothiazide/lisinopril pill at his hand. He reports that he is still  taking hydrochlorothiazide. Today his blood pressure is running low (110/40). Other differential diagnosis includes, but less likely, TIA/stroke (patient doesn't have any focal neurological sign), peripheral vertigo due to ear problem (less likely, no ear pain, ear ringing, or feeding of room spining). Other possibility includes drug abuse, alcohol abuse and anxiety. In addition patient may have a heart structure abnormality giving his a systolic murmur at the apical area.  # Seizure disorder: Patient has been on Dilantin 300 mg 3 times a day. He needs several dose in the last week. Today he is a Dilantin level is a 3.1. Patient has not had a seizure episode. Spoke with the pharmacy, will suggest to load the patient with a 600 mg of Dilantin and recheck in Dilantin level the morning.  - will load 600 mg today and re-check dilantin level in AM - will continue dilantin  -will get UDS  # Alcohol abuse: Patient has been drinking heavily, last drink was 2 days ago. He is at high-risk for withdraw. Although he states that he has not gone through withdrawal in the past when he has stopped drinking before,  he was noted to have physical symptoms suggestive of withdrawal including anxiety and hand shaking.   -We'll place the patient on an CiWA protocol for withdrawal. -will give thiamine and folate -Will consult SW  High blood pressure -  patient is supposed to take lisinopril only. But he has been taking hydrochlorothiazide and lisinopril combined pill. His blood pressure is running low.  -will d/c his home blood pressure medicaiton -will give IVF  Neck pain: It is most likely due to the muscle straining. He has pain only over the paraspinal area. There is no tenderness over the midline. His previous X-ray was negative both in neck and shoulder. There is no focal neurologic signs. No weakness or numbness in the arms or hands.  -will start lexeryl  Acute renal failure: Cre 4.14. Etiology is not clear.  Differential diagnosis includes ACEI, pre-renal failure, rhabdomyolysis, substance abuse such as ethylene glycol. Patient's CK is slightly elevated (259).   -will give IVF NS at 150 cc/h -will get FeNa -will get UA   # Elevated AG: AG=22. Patient's bicarbonate is a 25. Etiology is not clear right now. Patient lactase is normal. The differential diagnoses include methanol overdose, ethanol overdose, rhabdomyolysis and ethylene glycol, acute renal failure. It is less likely due to vomiting given his elevated AG. But the vomiting could contribute to hypochloremia.  -will get ABG. -will give IVF  #: Abnormal EKG: T wave inversion in inferior leads and V4 to V6. Possible ST elevation in V2 and V3. Currently patient doesn't have a chest pain, shortness of breath and palpitation. His first 2 sets of troponin were negative.   -monitor on Tele -will continue to cycle CE. -will monitor clinically for any symptoms of ACS -will consider consult cardiology -will repeat EKG in AM. -get 2D echo  # Alcohol abuse: Patient has been drinking heavily, last drink was 2 days ago. He is at high-risk for withdraw. Although he states that he has not gone through withdrawal in the past when he has stopped drinking before,  he was noted to have physical symptoms suggestive of withdrawal including anxiety and hand shaking.   -We'll place the patient on an CiWA protocol for withdrawal. -will give thiamine and folate -Will consult SW  #: hypomagnesia: Mg is 0.7. Will give 4 g of magnesium.  DVT PPx: Lovenox.   Signed: Lorretta Harp 07/28/2011, 6:11 AM  d

## 2011-07-27 NOTE — ED Notes (Signed)
Family at bedside. Pt states he has been having upper back pain that radiates up to his head, making him dizzy for the past cple of weeks. States he has had visual disturbances recently. Denies CP or SOB.

## 2011-07-28 ENCOUNTER — Encounter (HOSPITAL_COMMUNITY): Payer: Self-pay | Admitting: *Deleted

## 2011-07-28 DIAGNOSIS — I369 Nonrheumatic tricuspid valve disorder, unspecified: Secondary | ICD-10-CM

## 2011-07-28 DIAGNOSIS — N179 Acute kidney failure, unspecified: Principal | ICD-10-CM

## 2011-07-28 DIAGNOSIS — E872 Acidosis: Secondary | ICD-10-CM

## 2011-07-28 DIAGNOSIS — M6282 Rhabdomyolysis: Secondary | ICD-10-CM

## 2011-07-28 LAB — BASIC METABOLIC PANEL
Calcium: 7.1 mg/dL — ABNORMAL LOW (ref 8.4–10.5)
GFR calc Af Amer: 34 mL/min — ABNORMAL LOW (ref >90)
GFR calc non Af Amer: 29 mL/min — ABNORMAL LOW (ref >90)
Sodium: 130 mEq/L — ABNORMAL LOW (ref 135–145)

## 2011-07-28 LAB — PHENYTOIN LEVEL, TOTAL: Phenytoin Lvl: 10.3 ug/mL (ref 10.0–20.0)

## 2011-07-28 LAB — RAPID URINE DRUG SCREEN, HOSP PERFORMED
Amphetamines: NOT DETECTED
Barbiturates: NOT DETECTED
Opiates: NOT DETECTED
Tetrahydrocannabinol: NOT DETECTED

## 2011-07-28 LAB — CARDIAC PANEL(CRET KIN+CKTOT+MB+TROPI)
Relative Index: 1 (ref 0.0–2.5)
Relative Index: 0.8 (ref 0.0–2.5)
Relative Index: 0.8 (ref 0.0–2.5)
Troponin I: <0.3 ng/mL (ref <0.30)
Troponin I: <0.3 ng/mL (ref <0.30)

## 2011-07-28 LAB — OSMOLALITY: Osmolality: 284 mOsm/kg (ref 275–300)

## 2011-07-28 LAB — CBC
MCH: 33.5 pg (ref 26.0–34.0)
Platelets: 243 10*3/uL (ref 150–400)
RBC: 3.1 MIL/uL — ABNORMAL LOW (ref 4.22–5.81)
WBC: 10.4 10*3/uL (ref 4.0–10.5)

## 2011-07-28 LAB — SODIUM, URINE, RANDOM: Sodium, Ur: 71 mEq/L

## 2011-07-28 LAB — CREATININE, URINE, RANDOM: Creatinine, Urine: 66.82 mg/dL

## 2011-07-28 LAB — VITAMIN B12: Vitamin B-12: 184 pg/mL — ABNORMAL LOW (ref 211–911)

## 2011-07-28 MED ORDER — PHENYTOIN SODIUM EXTENDED 100 MG PO CAPS
100.0000 mg | ORAL_CAPSULE | Freq: Three times a day (TID) | ORAL | Status: DC
  Administered 2011-07-28 – 2011-07-29 (×3): 100 mg via ORAL
  Filled 2011-07-28 (×5): qty 1

## 2011-07-28 MED ORDER — AMLODIPINE BESYLATE 2.5 MG PO TABS
2.5000 mg | ORAL_TABLET | Freq: Every day | ORAL | Status: DC
  Administered 2011-07-28 – 2011-07-29 (×2): 2.5 mg via ORAL
  Filled 2011-07-28 (×2): qty 1

## 2011-07-28 MED ORDER — POTASSIUM CHLORIDE CRYS ER 20 MEQ PO TBCR
40.0000 meq | EXTENDED_RELEASE_TABLET | ORAL | Status: AC
  Administered 2011-07-28 (×2): 40 meq via ORAL
  Filled 2011-07-28 (×2): qty 2

## 2011-07-28 NOTE — Progress Notes (Signed)
  Echocardiogram 2D Echocardiogram has been performed.  Cathie Beams Deneen 07/28/2011, 11:58 AM

## 2011-07-28 NOTE — Progress Notes (Signed)
Subjective:  Patient's feels much better. He said his health condition is normal now. His dizziness and neck pain have resolved. Afebrile.  Objective: Vital signs in last 24 hours: Filed Vitals:   07/28/11 0717 07/28/11 0958 07/28/11 1400 07/28/11 1740  BP:  177/81 162/72 135/86  Pulse:  84 80 80  Temp:  98.3 F (36.8 C) 98.6 F (37 C) 98.3 F (36.8 C)  TempSrc:  Oral Oral Oral  Resp:  20 20 20   Height:      Weight: 115 lb 11.9 oz (52.5 kg)     SpO2:  98% 96% 98%   Weight change:   Intake/Output Summary (Last 24 hours) at 07/28/11 1814 Last data filed at 07/28/11 1700  Gross per 24 hour  Intake   3650 ml  Output   1451 ml  Net   2199 ml   General: resting in bed, not in acute distress.   HEENT: moist mucous and membrane, PERRL, EOMI, no scleral icterus, no JVD or Bruit. Cardiac: S1/S2, RRR, the systolic murmur heard at mitral valve area yesterday is not heard today.  No gallops or rubs Pulm: Good air movement bilaterally, Clear to auscultation bilaterally, No rales, wheezing, rhonchi or rubs. Abd: Soft,  nondistended, nontender, no rebound pain, no organomegaly, BS present Ext: No rashes or edema, 2+DP/PT pulse bilaterally.   Musculoskeletal: There is tenderness over paraspinal area in the neck posteriorly. There is no motion limitations. Skin: no rashes. No skin bruise. Neuro: alert and oriented X3, cranial nerves II-XII grossly intact, muscle strength 5/5 in all extremeties,  sensation to light touch intact. Normal finger to nose test. Has hand shaking when asked to stretching our his hands. Psych.: patient is not psychotic, no suicidal or hemocidal ideation.  Lab Results: Basic Metabolic Panel:  Lab 07/28/11 1610 07/27/11 1744 07/27/11 1359  NA 130* -- 135  K 2.8* -- 3.7  CL 92* -- 88*  CO2 22 -- 25  GLUCOSE 96 -- 98  BUN 40* -- 36*  CREATININE 2.51* -- 4.14*  CALCIUM 7.1* -- 8.5  MG -- 0.7* --  PHOS -- 6.2* --   Liver Function Tests:  Lab 07/28/11 0920  07/27/11 1359  AST -- 31  ALT -- 19  ALKPHOS -- 61  BILITOT -- 0.4  PROT -- 8.7*  ALBUMIN 3.3* 4.5   No results found for this basename: LIPASE:2,AMYLASE:2 in the last 168 hours No results found for this basename: AMMONIA:2 in the last 168 hours CBC:  Lab 07/28/11 0119 07/27/11 1359  WBC 10.4 11.5*  NEUTROABS -- 8.8*  HGB 10.4* 13.1  HCT 28.8* 36.3*  MCV 92.9 93.8  PLT 243 312   Cardiac Enzymes:  Lab 07/28/11 0920 07/28/11 0119 07/27/11 1742  CKTOTAL 254* 251* 259*  CKMB 2.0 2.4 2.2  CKMBINDEX -- -- --  TROPONINI <0.30 <0.30 <0.30   BNP: No results found for this basename: PROBNP:3 in the last 168 hours D-Dimer: No results found for this basename: DDIMER:2 in the last 168 hours CBG: No results found for this basename: GLUCAP:6 in the last 168 hours Hemoglobin A1C:  Lab 07/27/11 1744  HGBA1C 5.3   Fasting Lipid Panel: No results found for this basename: CHOL,HDL,LDLCALC,TRIG,CHOLHDL,LDLDIRECT in the last 960 hours Thyroid Function Tests:  Lab 07/27/11 1744  TSH 0.809  T4TOTAL --  FREET4 --  T3FREE --  THYROIDAB --   Coagulation:  Lab 07/27/11 1744  LABPROT 13.4  INR 1.00   Anemia Panel:  Lab 07/28/11 0119  VITAMINB12 184*  FOLATE --  FERRITIN --  TIBC --  IRON --  RETICCTPCT --   Urine Drug Screen: Drugs of Abuse     Component Value Date/Time   LABOPIA NONE DETECTED 07/28/2011 0601   COCAINSCRNUR NONE DETECTED 07/28/2011 0601   LABBENZ NONE DETECTED 07/28/2011 0601   AMPHETMU NONE DETECTED 07/28/2011 0601   THCU NONE DETECTED 07/28/2011 0601   LABBARB NONE DETECTED 07/28/2011 0601    Alcohol Level:  Lab 07/27/11 1744  ETH <11   Urinalysis:  Lab 07/27/11 1648  COLORURINE YELLOW  LABSPEC 1.020  PHURINE 5.5  GLUCOSEU NEGATIVE  HGBUR MODERATE*  BILIRUBINUR SMALL*  KETONESUR NEGATIVE  PROTEINUR 100*  UROBILINOGEN 0.2  NITRITE NEGATIVE  LEUKOCYTESUR NEGATIVE   Misc. Labs:  Micro Results: No results found for this or any previous  visit (from the past 240 hour(s)). Studies/Results: Dg Chest 2 View  07/27/2011  *RADIOLOGY REPORT*  Clinical Data: Weakness, chest pain, smoking history  CHEST - 2 VIEW  Comparison: Portable chest x-ray of 06/19/2011  Findings: The lungs are clear and slightly hyperaerated. Mediastinal contours appear stable.  The heart is within normal limits in size.  No bony abnormality is seen.  IMPRESSION: No active lung disease.  No change in slight hyperaeration.  Original Report Authenticated By: Juline Patch, M.D.   Medications:  Scheduled Meds:   . amLODipine  2.5 mg Oral Daily  . enoxaparin  30 mg Subcutaneous Q24H  . folic acid  1 mg Oral Daily  . magnesium sulfate 1 - 4 g bolus IVPB  4 g Intravenous Once  . multivitamin with minerals  1 tablet Oral Daily  . phenytoin (DILANTIN) IV  600 mg Intravenous Once  . phenytoin  100 mg Oral TID  . potassium chloride  40 mEq Oral Q4H  . sodium chloride  250 mL Intravenous Once  . thiamine  100 mg Oral Daily   Or  . thiamine  100 mg Intravenous Daily  . DISCONTD: sodium chloride  3 mL Intravenous Q12H   Continuous Infusions:   . sodium chloride 75 mL/hr at 07/28/11 1400   PRN Meds:.cyclobenzaprine, LORazepam, LORazepam Assessment/Plan:  #. Dizziness: improved. Patient's dizziness has resolved.  Etiology is not clear at this moment. Since patient responded to the fluid repletion very well, it is most likely due to comparatively low blood pressure on admission. Patient's hypertension medication has been adjusted to on June 3. He supposed to take lisinopril only. However he still have hydrochlorothiazide/lisinopril pill at his hand. He reports that he is still taking hydrochlorothiazide. Today his blood pressure is running low (110/40). Other differential diagnosis includes, but less likely, TIA/stroke (patient doesn't have any focal neurological sign), peripheral vertigo due to ear problem (less likely, no ear pain, ear ringing, or feeding of room  spining). Other possibility includes drug abuse, alcohol abuse and anxiety. In addition patient may have a heart structure abnormality giving his a systolic murmur at the apical area. However her heart murmur was not detected today. 2-D echo was performed and the result is pending. Currently patient's blood pressure is running high 177/81.  - will continue IVF fluid at 75 cc/h - will start low dose of amlodipine 2.5 mg daily  # Seizure disorder: Patient has been on Dilantin 300 mg 3 times a day. He needs several dose in the last week. His Dilantin level is a 3.1 on admission. Patient has not had a seizure episode. Spoke with the pharmacy, will suggest to load the  patient with a 600 mg of Dilantin and recheck in Dilantin level the morning. Today his Dilantin level is 10.3. UDS is negative. Ethanol level is less than 11.  - will start his home dose of dilantin and recheck dilantin level in AM -will get UDS  # Alcohol abuse: Patient has been drinking heavily, last drink was 2 days ago. He is at high-risk for withdraw. Although he states that he has not gone through withdrawal in the past when he has stopped drinking before,  he was noted to have physical symptoms suggestive of withdrawal including anxiety and hand shaking on admission.  -continue CiWA protocol for withdrawal. -continue thiamine and folate - SW Consult  High blood pressure -  patient is supposed to take lisinopril only. But he has been taking hydrochlorothiazide and lisinopril combined pill. His blood pressure is running low on admission. At the fluid t repletion, patient's blood pressure is running high today. 177/81  -will start amlodipine 2.5mg   qd  Neck pain: resolved.  It is most likely due to the muscle straining. He has pain only over the paraspinal area. There is no tenderness over the midline. His previous X-ray was negative both in neck and shoulder. There is no focal neurologic signs. No weakness or numbness in the arms or  hands.  -Continue flexeryl  Acute renal failure: Improving. Today creatinine is a 2.5 which is down from a 4.14 on admission. Etiology is not clear. Differential diagnosis includes ACEI, pre-renal failure, rhabdomyolysis, substance abuse such as ethylene glycol. Patient's CK is slightly elevated (259). FeNa is 2.0, indicating patient could have ATN. However patient is responding to the fluid repletion very well. Patient's urinalysis is negative for UTI.  -will continue IVF NS at 75 cc/h -will get UA   # Elevated AG: Improving. AG=22 on admission. Today AG is 13. Patient's bicarbonate is a 25 on admission. Patient did not have acidosis on ABG. Etiology is not clear right now. Patient lactase is normal. It is most likely due to acute renal failure,  Hyperphosphatemia and rhabdomyolysis. It is less likely due to vomiting given his elevated AG. But the vomiting could contribute to his hypochloremia.  -will continue IVF  #: Abnormal EKG: T wave inversion in inferior leads and V4 to V6. Possible ST elevation in V2 and V3. Patient did not  have chest pain, shortness of breath and palpitation. His first 3 sets of troponin were negative.   today his EKG is improving. Patient still does not have any chest pain, shortness of breath and palpitation. His 2-D echo is pending.  -will monitor clinically for any symptoms of ACS -will consider consult cardiology if develops chest pain or EKG worsening change  #: hypomagnesia: Mg is 0.7 on admission. Atfer giving 4 g of magnesium, his magnesium is normal today.   DVT PPx: Lovenox.         LOS: 1 day   Lorretta Harp 07/28/2011, 6:14 PM

## 2011-07-28 NOTE — H&P (Signed)
Internal Medicine Attending Admission Note Date: 07/28/2011  Patient name: Joseph Underwood Medical record number: [id number] Date of birth: [birth year] Age: 46 y.o. Gender: male  I saw and evaluated the patient. I reviewed the resident's note and I agree with the resident's findings and plan as documented in the resident's note, with additional comments as noted below.  Chief Complaint(s): Dizziness, posterior neck pain  History - key components related to admission: Patient is a 46 year old man with a history of seizures, alcohol abuse, and other problems as outlined in the medical history, admitted with complaint of posterior neck pain and dizziness which started at work.  Patient reports that he works outside in the heat and had been sweating a lot on the day of admission.  He denies any chest pain, shortness of breath, nausea, vomiting, diarrhea, or lower extremity edema.  He reports that his oral intake has been normal.   Physical Exam - key components related to admission:  Filed Vitals:   07/28/11 0548 07/28/11 0717 07/28/11 0958 07/28/11 1400  BP: 121/89  177/81 162/72  Pulse: 93  84 80  Temp:   98.3 F (36.8 C) 98.6 F (37 C)  TempSrc:   Oral Oral  Resp: 20  20 20   Height:      Weight:  115 lb 11.9 oz (52.5 kg)    SpO2: 99%  98% 96%    General: Alert, no distress Neck: Supple, no tenderness Lungs: Clear Heart: Regular; S1-S2, questionable 1/6 early diastolic murmur; no S3, no S4 Abdomen: Bowel sounds present, soft, nontender; no hepatosplenomegaly Extremities: No edema  Lab results:   Basic Metabolic Panel:  Basename 07/28/11 0119 07/27/11 1744 07/27/11 1359  NA 130* -- 135  K 2.8* -- 3.7  CL 92* -- 88*  CO2 22 -- 25  GLUCOSE 96 -- 98  BUN 40* -- 36*  CREATININE 2.51* -- 4.14*  CALCIUM 7.1* -- 8.5  MG -- 0.7* --  PHOS -- 6.2* --      Component Value Date/Time   CREATININE 2.51* 07/28/2011 0119   CREATININE 4.14* 07/27/2011 1359   CREATININE 0.90 06/19/2011  2027   CREATININE 1.02 02/22/2011 1207   CREATININE 1.12 10/09/2010 1000   CREATININE 2.61* 07/09/2010 1930   CREATININE 0.70 DELTA CHECK NOTED 05/15/2010 0645   CREATININE 1.5 05/14/2010 1514   CREATININE 1.32 05/14/2010 1458   CREATININE 0.93 01/01/2010 1300     Liver Function Tests:  Basename 07/28/11 0920 07/27/11 1359  AST -- 31  ALT -- 19  ALKPHOS -- 61  BILITOT -- 0.4  PROT -- 8.7*  ALBUMIN 3.3* 4.5     CBC:  Basename 07/28/11 0119 07/27/11 1359  WBC 10.4 11.5*  NEUTROABS -- 8.8*  HGB 10.4* 13.1  HCT 28.8* 36.3*  MCV 92.9 93.8  PLT 243 312    Cardiac Enzymes:  Basename 07/28/11 0920 07/28/11 0119 07/27/11 1742  CKTOTAL 254* 251* 259*  CKMB 2.0 2.4 2.2  CKMBINDEX -- -- --  TROPONINI <0.30 <0.30 <0.30    Hemoglobin A1C:  Basename 07/27/11 1744  HGBA1C 5.3     Thyroid Function Tests:  Basename 07/27/11 1744  TSH 0.809  T4TOTAL --  FREET4 --  T3FREE --  THYROIDAB --    Anemia Panel:  Basename 07/28/11 0119  VITAMINB12 184*  FOLATE --  FERRITIN --  TIBC --  IRON --  RETICCTPCT --    Coagulation:  Basename 07/27/11 1744  INR 1.00   ABG    Component Value  Date/Time   PHART 7.417 07/27/2011 2058   PCO2ART 35.2 07/27/2011 2058   PO2ART 89.4 07/27/2011 2058   HCO3 22.3 07/27/2011 2058   TCO2 23.3 07/27/2011 2058   ACIDBASEDEF 1.6 07/27/2011 2058   O2SAT 96.0 07/27/2011 2058     Urinalysis    Component Value Date/Time   COLORURINE YELLOW 07/27/2011 1648   APPEARANCEUR CLEAR 07/27/2011 1648   LABSPEC 1.020 07/27/2011 1648   PHURINE 5.5 07/27/2011 1648   GLUCOSEU NEGATIVE 07/27/2011 1648   HGBUR MODERATE* 07/27/2011 1648   BILIRUBINUR SMALL* 07/27/2011 1648   KETONESUR NEGATIVE 07/27/2011 1648   PROTEINUR 100* 07/27/2011 1648   UROBILINOGEN 0.2 07/27/2011 1648   NITRITE NEGATIVE 07/27/2011 1648   LEUKOCYTESUR NEGATIVE 07/27/2011 1648    Drugs of Abuse     Component Value Date/Time   LABOPIA NONE DETECTED 07/28/2011 0601   COCAINSCRNUR NONE  DETECTED 07/28/2011 0601   LABBENZ NONE DETECTED 07/28/2011 0601   AMPHETMU NONE DETECTED 07/28/2011 0601   THCU NONE DETECTED 07/28/2011 0601   LABBARB NONE DETECTED 07/28/2011 0601     Alcohol Level:  Basename 07/27/11 1744  ETH <11    Imaging results:  Dg Chest 2 View  07/27/2011  *RADIOLOGY REPORT*  Clinical Data: Weakness, chest pain, smoking history  CHEST - 2 VIEW  Comparison: Portable chest x-ray of 06/19/2011  Findings: The lungs are clear and slightly hyperaerated. Mediastinal contours appear stable.  The heart is within normal limits in size.  No bony abnormality is seen.  IMPRESSION: No active lung disease.  No change in slight hyperaeration.  Original Report Authenticated By: Juline Patch, M.D.    Other results: EKG 6/12:  Sinus tachycardia, rate 102; biatrial abnormalities; right axis deviation; T wave inversion in the inferior leads and lateral chest leads; abnormal T wave in V3. EKG 6/13: Normal sinus rhythm; rightward axis; septal infarct, age undetermined  Assessment & Plan by Problem:  1.  Acute renal failure.  Patient has acute renal failure of unclear etiology; his creatinine was normal in May of this year.  His FENa is 2, which does not support acute prerenal azotemia.  He was on an ACE inhibitor and this may be the cause of his acute renal failure, which was possibly aggravated by volume depletion.  His creatinine is improving today.  Plan is to follow creatinine closely; if it does not normalize, then work up further including renal ultrasound and full laboratory evaluation.  2. Elevated anion gap. Patient is not acidotic by ABG.  Anion gap is correcting.  I doubt a toxic ingestion as the cause.  Plan is follow metabolic panel, work up further if this does not correct completely.  3.  Dizziness.  This symptom has resolved following IV normal saline volume replacement.  4.  Alcohol abuse.  No overt signs of withdrawal.  Plan is CIWA protocol; give thiamine and folate;  social work consult.  5.  Hypertension.  Plan is stop ACE inhibitor; follow blood pressure and treat with alternative agent as indicated.  6.  Abnormal EKG and cardiac murmur.  Patient has no symptoms of acute coronary syndrome, and his cardiac enzymes are negative.  Plan is monitor; 2-D echocardiogram.  7.  Hypokalemia.  Careful potassium replacement; follow potassium level.

## 2011-07-28 NOTE — Progress Notes (Signed)
Late Entry: Pt transferred from the ED around 1900, admitted to Rm 6708 by charge nurse and placed on tele (running NSR/ST). He is alert and oriented, comes from home with family. Ambulatory with standby assist, pt admitted with near syncopal event and seizure precautions. Oriented to room, instructed to call for assistance before getting out of bed. Educated on safety and fall prevention. No skin breakdown noted. Resting comfortably at this time, no complaints of pain. Will continue to monitor  [facility]

## 2011-07-28 NOTE — Progress Notes (Signed)
Utilization Review Completed.Dorcas Carrow T6/13/2013

## 2011-07-29 ENCOUNTER — Other Ambulatory Visit: Payer: Self-pay | Admitting: Internal Medicine

## 2011-07-29 LAB — BASIC METABOLIC PANEL
BUN: 17 mg/dL (ref 6–23)
CO2: 25 mEq/L (ref 19–32)
Calcium: 8.2 mg/dL — ABNORMAL LOW (ref 8.4–10.5)
Creatinine, Ser: 0.92 mg/dL (ref 0.50–1.35)
Glucose, Bld: 87 mg/dL (ref 70–99)

## 2011-07-29 MED ORDER — AMLODIPINE BESYLATE 2.5 MG PO TABS: 2.5000 mg | ORAL_TABLET | Freq: Every day | ORAL | Status: DC

## 2011-07-29 MED ORDER — PHENYTOIN SODIUM EXTENDED 100 MG PO CAPS: 100.0000 mg | ORAL_CAPSULE | Freq: Three times a day (TID) | ORAL | Status: DC

## 2011-07-29 MED ORDER — THIAMINE HCL 100 MG PO TABS: 100.0000 mg | ORAL_TABLET | Freq: Every day | ORAL | Status: AC

## 2011-07-29 MED ORDER — POTASSIUM CHLORIDE CRYS ER 20 MEQ PO TBCR
40.0000 meq | EXTENDED_RELEASE_TABLET | Freq: Once | ORAL | Status: AC
  Administered 2011-07-29: 40 meq via ORAL
  Filled 2011-07-29: qty 2

## 2011-07-29 MED ORDER — FOLIC ACID 1 MG PO TABS: 1.0000 mg | ORAL_TABLET | Freq: Every day | ORAL | Status: DC

## 2011-07-29 MED ORDER — MAGNESIUM SULFATE 40 MG/ML IJ SOLN
2.0000 g | Freq: Once | INTRAMUSCULAR | Status: AC
  Administered 2011-07-29: 2 g via INTRAVENOUS
  Filled 2011-07-29: qty 50

## 2011-07-29 MED ORDER — CYCLOBENZAPRINE HCL 5 MG PO TABS: 5.0000 mg | ORAL_TABLET | Freq: Three times a day (TID) | ORAL | Status: AC | PRN

## 2011-07-29 MED ORDER — ENOXAPARIN SODIUM 40 MG/0.4ML ~~LOC~~ SOLN
40.0000 mg | SUBCUTANEOUS | Status: DC
  Filled 2011-07-29: qty 0.4

## 2011-07-29 NOTE — Progress Notes (Signed)
Subjective:  Patient's feels great. He said his health condition is normal now. His dizziness and neck pain have resolved. Afebrile.  Objective: Vital signs in last 24 hours: Filed Vitals:   07/28/11 2038 07/29/11 0559 07/29/11 0835 07/29/11 1145  BP: 133/88 157/96 134/77 120/78  Pulse: 86 85 90 89  Temp: 98.7 F (37.1 C) 98.5 F (36.9 C) 98.5 F (36.9 C) 99 F (37.2 C)  TempSrc: Oral Oral Oral Oral  Resp: 20 18 18 18   Height:      Weight: 119 lb 7.8 oz (54.2 kg)     SpO2: 100% 100% 99% 100%   Weight change:   Intake/Output Summary (Last 24 hours) at 07/29/11 1509 Last data filed at 07/29/11 0700  Gross per 24 hour  Intake   1605 ml  Output    650 ml  Net    955 ml   General: resting in bed, not in acute distress.   HEENT: moist mucous and membrane, PERRL, EOMI, no scleral icterus, no JVD or Bruit. Cardiac: S1/S2, RRR, no murmur  No gallops or rubs Pulm: Good air movement bilaterally, Clear to auscultation bilaterally, No rales, wheezing, rhonchi or rubs. Abd: Soft,  nondistended, nontender, no rebound pain, no organomegaly, BS present Ext: No rashes or edema, 2+DP/PT pulse bilaterally.   Musculoskeletal: There is mild tenderness over paraspinal area in the neck posteriorly. There is no motion limitations. Skin: no rashes. No skin bruise. Neuro: alert and oriented X3, cranial nerves II-XII grossly intact, muscle strength 5/5 in all extremeties,  sensation to light touch intact. Normal finger to nose test. Has hand shaking when asked to stretching our his hands. Psych.: patient is not psychotic, no suicidal or hemocidal ideation.  Lab Results: Basic Metabolic Panel:  Lab 07/29/11 9604 07/28/11 0119 07/27/11 1744  NA 135 130* --  K 3.4* 2.8* --  CL 100 92* --  CO2 25 22 --  GLUCOSE 87 96 --  BUN 17 40* --  CREATININE 0.92 2.51* --  CALCIUM 8.2* 7.1* --  MG 1.4* -- 0.7*  PHOS 2.2* -- 6.2*   Liver Function Tests:  Lab 07/28/11 0920 07/27/11 1359  AST -- 31  ALT  -- 19  ALKPHOS -- 61  BILITOT -- 0.4  PROT -- 8.7*  ALBUMIN 3.3* 4.5   No results found for this basename: LIPASE:2,AMYLASE:2 in the last 168 hours No results found for this basename: AMMONIA:2 in the last 168 hours CBC:  Lab 07/28/11 0119 07/27/11 1359  WBC 10.4 11.5*  NEUTROABS -- 8.8*  HGB 10.4* 13.1  HCT 28.8* 36.3*  MCV 92.9 93.8  PLT 243 312   Cardiac Enzymes:  Lab 07/28/11 1749 07/28/11 0920 07/28/11 0119  CKTOTAL 249* 254* 251*  CKMB 2.1 2.0 2.4  CKMBINDEX -- -- --  TROPONINI <0.30 <0.30 <0.30   BNP: No results found for this basename: PROBNP:3 in the last 168 hours D-Dimer: No results found for this basename: DDIMER:2 in the last 168 hours CBG: No results found for this basename: GLUCAP:6 in the last 168 hours Hemoglobin A1C:  Lab 07/27/11 1744  HGBA1C 5.3   Fasting Lipid Panel: No results found for this basename: CHOL,HDL,LDLCALC,TRIG,CHOLHDL,LDLDIRECT in the last 540 hours Thyroid Function Tests:  Lab 07/27/11 1744  TSH 0.809  T4TOTAL --  FREET4 --  T3FREE --  THYROIDAB --   Coagulation:  Lab 07/27/11 1744  LABPROT 13.4  INR 1.00   Anemia Panel:  Lab 07/28/11 0119  VITAMINB12 184*  FOLATE --  FERRITIN --  TIBC --  IRON --  RETICCTPCT --   Urine Drug Screen: Drugs of Abuse     Component Value Date/Time   LABOPIA NONE DETECTED 07/28/2011 0601   COCAINSCRNUR NONE DETECTED 07/28/2011 0601   LABBENZ NONE DETECTED 07/28/2011 0601   AMPHETMU NONE DETECTED 07/28/2011 0601   THCU NONE DETECTED 07/28/2011 0601   LABBARB NONE DETECTED 07/28/2011 0601    Alcohol Level:  Lab 07/27/11 1744  ETH <11   Urinalysis:  Lab 07/27/11 1648  COLORURINE YELLOW  LABSPEC 1.020  PHURINE 5.5  GLUCOSEU NEGATIVE  HGBUR MODERATE*  BILIRUBINUR SMALL*  KETONESUR NEGATIVE  PROTEINUR 100*  UROBILINOGEN 0.2  NITRITE NEGATIVE  LEUKOCYTESUR NEGATIVE   Misc. Labs:  Micro Results: No results found for this or any previous visit (from the past 240  hour(s)). Studies/Results: Dg Chest 2 View  07/27/2011  *RADIOLOGY REPORT*  Clinical Data: Weakness, chest pain, smoking history  CHEST - 2 VIEW  Comparison: Portable chest x-ray of 06/19/2011  Findings: The lungs are clear and slightly hyperaerated. Mediastinal contours appear stable.  The heart is within normal limits in size.  No bony abnormality is seen.  IMPRESSION: No active lung disease.  No change in slight hyperaeration.  Original Report Authenticated By: Juline Patch, M.D.   Medications:  Scheduled Meds:    . amLODipine  2.5 mg Oral Daily  . enoxaparin  40 mg Subcutaneous Q24H  . folic acid  1 mg Oral Daily  . magnesium sulfate 1 - 4 g bolus IVPB  2 g Intravenous Once  . multivitamin with minerals  1 tablet Oral Daily  . phenytoin  100 mg Oral TID  . potassium chloride  40 mEq Oral Once  . thiamine  100 mg Oral Daily   Or  . thiamine  100 mg Intravenous Daily  . DISCONTD: enoxaparin  30 mg Subcutaneous Q24H   Continuous Infusions:    . DISCONTD: sodium chloride Stopped (07/29/11 1317)   PRN Meds:.cyclobenzaprine, LORazepam, LORazepam Assessment/Plan:  #. Dizziness: Patient's dizziness has resolved.  Etiology is not clear at this moment. Since patient responded to the fluid repletion very well, it is most likely due to comparatively low blood pressure on admission. Patient's hypertension medication has been adjusted to on June 3. He supposed to take lisinopril only. However he still have hydrochlorothiazide/lisinopril pill at his hand. He reports that he is still taking hydrochlorothiazide. His blood pressure is running low on admission. Other differential diagnosis includes, but less likely, TIA/stroke (patient doesn't have any focal neurological sign), peripheral vertigo due to ear problem (less likely, no ear pain, ear ringing, or feeding of room spining). Other possibility includes drug abuse, alcohol abuse and anxiety. His 2D-echo showed EF 55 to 60 %. No wall motion  abnormality. Wall thickness was increased in a pattern of moderate LVH. Patient will be discharged on low dose of amlodipine 2.5 mg daily and follow up at clinic in one week.  # Seizure disorder: Patient has been on Dilantin 100 mg, 3 times a day. He missed several dose in the last week. His Dilantin level is a 3.1 on admission. Patient has not had a seizure episode. Spoke with the pharmacy, will suggest to load the patient with a 600 mg of Dilantin and recheck in Dilantin level in the following morning. His dilantin level increased to 10.3 on 6/13. Today his Dilantin level is 5.5. UDS is negative. Ethanol level is less than 11. I spoke with pharmacy, suggested to let  patient take 100 mg in AM and noon and then 100 mg tonight. Patient will then take 100 mg of dilantin daily. He will be discharge on 100 mg Tid of dilantin. He will need to be followed up at clinic in one week to check his dilantin level. He promised to do it.   # Alcohol abuse: Patient has been drinking heavily, last drink was 2 days ago. He is at high-risk for withdraw. Although he states that he has not gone through withdrawal in the past when he has stopped drinking before,  he was noted to have physical symptoms suggestive of withdrawal including anxiety and hand shaking on admission. Continued CiWA protocol for withdrawal in hospital. Gave thiamine and folate and consulted  SW.  # High blood pressure -  patient is supposed to take lisinopril only. But he has been taking hydrochlorothiazide and lisinopril combined pill. His blood pressure is running low on admission. At the fluid t repletion, patient's blood pressure is running high today. 177/81. Then we started amlodipine 2.5mg  qd. His blood pressure is 157/96 in AM. Will continue the same regimen.  # Neck pain: resolved.  It is most likely due to the muscle straining. He has pain only over the paraspinal area. There is no tenderness over the midline. His previous X-ray was negative  both in neck and shoulder. There is no focal neurologic signs. No weakness or numbness in the arms or hands. Continue flexeril.  # Acute renal failure: resolved.  Today creatinine is a 0.92. Etiology is not clear. Differential diagnosis includes ACEI, pre-renal failure, rhabdomyolysis, substance abuse such as ethylene glycol. Patient's CK is slightly elevated (259). FeNa is 2.0, indicating patient could have ATN. However patient is responding to the fluid repletion very well. Patient's urinalysis is negative for UTI.  # Elevated AG: resolved.  AG=22 on admission. Patient's bicarbonate is a 25 on admission. Patient did not have acidosis on ABG. Etiology is not clear right now. Patient lactase is normal. It is most likely due to acute renal failure,  Hyperphosphatemia and rhabdomyolysis. It is less likely due to vomiting given his elevated AG. But the vomiting could contribute to his hypochloremia.  #: Abnormal EKG: T wave inversion in inferior leads and V4 to V6. Possible ST elevation in V2 and V3. Patient did not  have chest pain, shortness of breath and palpitation. His first 3 sets of troponin were negative.   Today his EKG is improving. Patient still does not have any chest pain, shortness of breath and palpitation. His 2-D echo is negative for wall motion abnormality. He will be discharged today and follow up in clinic.  #: hypomagnesia: Mg is 1.4 on admission. Will give 2 g  of Magnesium sulfate by IV  # hypokalemia: K is 3.4. Will give 40 mEq of oral KCl.   Desposition: d/c home         LOS: 2 days   Lorretta Harp 07/29/2011, 3:09 PM

## 2011-07-29 NOTE — Discharge Instructions (Signed)
1. You need to come back to our clinic for checking your dilantin level in one week. This is extremetly important. Our clinic office will call you for an appointment . All you can call [phone] for appointment.  2. Please take all medications as prescribed.   3. If you develop chest pain, shortness of breath or heart beating too fast, please come to emergency department immediately.  4. If you have worsening of your symptoms or new symptoms arise, please call the clinic ([phone]), or go to the ER immediately if symptoms are severe.  5. Please take 200 mg dilantine tonight. And then take 100 mg, three time a day until you come back to our clinic to check your blood dilantin level.

## 2011-07-29 NOTE — Discharge Summary (Signed)
Patient Name:  Joseph Underwood  MRN: [id number]  PCP: Joseph Mech, MD  DOB:  46 years old       Date of Admission:  07/27/2011  Date of Discharge:  07/29/2011      Attending Physician: Dr. Farley Ly, MD         DISCHARGE DIAGNOSES: 1. Dizziness: likely due to non-compliance to HTN medication (took extra HCTZ than needed). Resolved after IVF.  2. Seizure: dilantin level sub-therapeutic. Loaded with 600 mg dilantin on admission and discharged on 100 mg dilantin tid.  3. HTN: discharged on 2.5 mg amlodipine dialy. 4. Acute renal failure: resolved after IVF. 5. Abnormal EKG: T wave inversion in inferior leads and V4 to V6. Possible ST elevation in V2 and V3. CE negative. 2D echo no wall motion abnormality. No chest pain. EKG improved at discharge. 6. Neck pain: likely due to the muscle straining and degenerative disc disease. Resolved at discharge. 7. Elevated AG: likely due to  acute renal failure,  Hyperphosphatemia and mild rhabdomyolysis. Resolved at discharge. 8. Mild rhabdomyolysis: total CK is 259 on admission. Repeat total CK is 249 on 07/28/11. 9. Alcohol abuse: 10. Hypokalemia: repleted before discharge.   DISPOSITION AND FOLLOW-UP: Joseph Underwood is to follow-up with the listed providers as detailed below, at patient's visiting, please address following issues:  1. Please check his dilantin level to assure it is therapeutic. 2. Please ask if he has any chest pain, palpitation or SOB. If yes, he may need to have cardiac work up and give referral to cardiology. 3. Please check his blood pressure and adjust his HTM medications accordingly. 4. Check his BMP to make sure his K is OK. 5. Please follow up with his Methylmalonic acid test result. His vitamin B12 level was low. The Methylmalonic acid test is still pending at discharge. 6. Check total CK in 4 weeks to assure his rhabdomyolysis has resolved.   Follow-up Information    Please follow up. (you need to come  back to our clinic for checking your dilantin  level in one week. This is extremetly important.  Our clinic office will call you for  an appointment . [phone])         Discharge Orders    Future Appointments: Provider: Department: Dept Phone: Center:   08/02/2011 8:15 AM Joseph Gerald, MD [facility] [phone] [facility]   10/28/2011 1:15 PM Joseph Bonus, MD [facility] [phone] [facility]       DISCHARGE MEDICATIONS: Medication List  As of 07/29/2011  2:46 PM   ASK your doctor about these medications         chlorproMAZINE 10 MG tablet   Commonly known as: THORAZINE   Take 1 tablet (10 mg total) by mouth 3 (three) times daily as needed.      lisinopril 10 MG tablet   Commonly known as: PRINIVIL,ZESTRIL   Take 1 tablet (10 mg total) by mouth daily.      phenytoin 100 MG ER capsule   Commonly known as: DILANTIN   Take 100 mg by mouth 3 (three) times daily.      ranitidine 150 MG tablet   Commonly known as: ZANTAC   Take 1 tablet (150 mg total) by mouth 2 (two) times daily.             CONSULTS: none     PROCEDURES PERFORMED:  Dg Chest 2 View  07/27/2011  *RADIOLOGY REPORT*  Clinical Data:  Weakness, chest pain, smoking history  CHEST - 2 VIEW  Comparison: Portable chest x-ray of 06/19/2011  Findings: The lungs are clear and slightly hyperaerated. Mediastinal contours appear stable.  The heart is within normal limits in size.  No bony abnormality is seen.  IMPRESSION: No active lung disease.  No change in slight hyperaeration.  Original Report Authenticated By: Joseph Underwood, M.D.       ADMISSION DATA: H&P: Patient is a 46 year old male with a past medical history of seizure disorder, hypertension, and alcohol abuse, who presents with dizziness and neck pain.  Per patient, he started having dizziness and neck pain since  several months ago. Two symptoms almost always come together. It is intermittent, happens every 3-4 days, each time  lasts for about 1 to 2 days. His neck pain is located at the posterior neck, 8/10 in severity when it happens, it is aching-like pain and nonradiating. It is not aggravated or alleviated with any factors. Patient denies any chest pain, shortness of breath, palpitation, ear pain, ear ringing, injury or sick contacts. His dizziness has been progressively become worse.  Of note,  Patient's medication for HTN was adjusted in clinic on June 3. In that visit his bp was 110/63. His HCTZ/lisinopril was changed to Lisinopril only. However, today in ED, he still has HCTZ/lisinopril pill at his hand. He reports that he is still taking the same pills.   Patient also reported that she had four bowel moments with a loose stool yesterday, no blood in it. He also had nausea and vomited twice yesterday. But he did not have abdominal pain. Currently he dos not have nausea and vomiting mor abdominal pain.  Denies fever, chills,headaches,  cough, chest pain, SOB,  abdominal pain, constipation, dysuria, urgency, frequency, hematuria, joint pain or leg swelling.   Physical Exam:  Blood pressure 110/40, pulse 97, temperature (37.2 C), temperature source Oral, resp. rate 18, SpO2 99.00%.   Vitals: T: 99 F      HR: 97       BP: 110/40       RR:18    O2 saturation: 99% General: resting in bed, not in acute distress. Looks anxious.  HEENT: dry mucous and membrane, PERRL, EOMI, no scleral icterus, no JVD or Bruit. Cardiac: S1/S2, RRR, 2/6 systolic murmurs best heard at mitral valve area,  No gallops or rubs Pulm: Good air movement bilaterally, Clear to auscultation bilaterally, No rales, wheezing, rhonchi or rubs. Abd: Soft,  nondistended, nontender, no rebound pain, no organomegaly, BS present Ext: No rashes or edema, 2+DP/PT pulse bilaterally.   Musculoskeletal: There is tenderness over paraspinal area in the neck posteriorly. There is no motion limitations. Skin: no rashes. No skin bruise. Neuro: alert and oriented  X3, cranial nerves II-XII grossly intact, muscle strength 5/5 in all extremeties,  sensation to light touch intact. Normal finger to nose test. Has hand shaking when asked to stretching our his hands. Psych.: patient is not psychotic, no suicidal or hemocidal ideation.  Lab results: Basic Metabolic Panel:  Basename  07/28/11 0119  07/27/11 1744  07/27/11 1359   NA  130*  --  135   K  2.8*  --  3.7   CL  92*  --  88*   CO2  22  --  25   GLUCOSE  96  --  98   BUN  40*  --  36*   CREATININE  2.51*  --  4.14*   CALCIUM  7.1*  --  8.5   MG  --  0.7*  --   PHOS  --  6.2*  --    Liver Function Tests:  Basename  07/27/11 1359   AST  31   ALT  19   ALKPHOS  61   BILITOT  0.4   PROT  8.7*   ALBUMIN  4.5    No results found for this basename: LIPASE:2,AMYLASE:2 in the last 72 hours No results found for this basename: AMMONIA:2 in the last 72 hours CBC:  Basename  07/28/11 0119  07/27/11 1359   WBC  10.4  11.5*   NEUTROABS  --  8.8*   HGB  10.4*  13.1   HCT  28.8*  36.3*   MCV  92.9  93.8   PLT  243  312    Cardiac Enzymes:  Basename  07/28/11 0119  07/27/11 1742  07/27/11 1502   CKTOTAL  251*  259*  --   CKMB  2.4  2.2  --   CKMBINDEX  --  --  --   TROPONINI  <0.30  <0.30  <0.30      Basename  07/27/11 1744   HGBA1C  5.3     Thyroid Function Tests:  Basename  07/27/11 1744   TSH  0.809   T4TOTAL  --   FREET4  --   T3FREE  --   THYROIDAB  --     Basename  07/27/11 1744   LABPROT  13.4   INR  1.00    Urine Drug Screen: Drugs of Abuse      Component  Value  Date/Time     LABOPIA  POSITIVE*  10/09/2010 1353     COCAINSCRNUR  NONE DETECTED  10/09/2010 1353     LABBENZ  NONE DETECTED  10/09/2010 1353     AMPHETMU  NONE DETECTED  10/09/2010 1353     THCU  NONE DETECTED  10/09/2010 1353     LABBARB  NONE DETECTED  10/09/2010 1353    Alcohol Level:  Basename  07/27/11 1744   ETH  <11    Urinalysis:  Basename  07/27/11 1648   COLORURINE  YELLOW   LABSPEC   1.020   PHURINE  5.5   GLUCOSEU  NEGATIVE   HGBUR  MODERATE*   BILIRUBINUR  SMALL*   KETONESUR  NEGATIVE   PROTEINUR  100*   UROBILINOGEN  0.2   NITRITE  NEGATIVE   LEUKOCYTESUR  NEGATIVE    HOSPITAL COURSE:  #. Dizziness: Etiology is not clear at this moment.  It is most likely due to comparatively low blood pressure and dehydration on admission (bp 110/40 mmHg). Patient's hypertension medication has been adjusted on June 3. He supposed to take lisinopril only. However he still have hydrochlorothiazide/lisinopril pill at his hand. He reports that he is still taking  hydrochlorothiazide/lisinopril pill.  His blood pressure is running low on admission. Other differential diagnosis includes, but less likely, TIA/stroke (patient doesn't have any focal neurological sign), peripheral vertigo due to ear problem (less likely, no ear pain, ear ringing, or feeding of room spinning). Other possibility includes drug abuse (his UDS is negative), alcohol abuse and anxiety. His 2D-echo showed EF 55 to 60 %. No wall motion abnormality. Wall thickness was increased in a pattern of moderate LVH. Patient responded to the fluid repletion very well, his dizziness has resolved at discharge. Patient is discharged on low dose of amlodipine 2.5 mg daily and follow up at clinic in one week.    #  Seizure disorder: Patient has been on Dilantin 100 mg, 3 times a day. He missed several dose in the last week. His Dilantin level is  3.1 on admission. Patient has not had a seizure episode. Spoke with the pharmacy, suggested to load the patient with a 600 mg of Dilantin and recheck in Dilantin level in the following morning. His dilantin level increased to 10.3 on 6/13. At discharge, his dilantin level is 5.5.  I spoke with pharmacy, suggested to let patient take 100 mg in AM and noon and, then 200 mg  at night on the discharge day. Then, patient will take 100 mg of dilantin tid. He will be discharged on 100 mg Tid of dilantin. He  will need to be followed up at clinic in one week to check his dilantin level.   # Alcohol abuse: Patient has been drinking heavily, last drink was 2 days ago prior to admission. He is at high-risk for withdraw. Although he states that he has not gone through withdrawal in the past when he has stopped drinking before,  he was noted to have physical symptoms suggestive of withdrawal including anxiety and hand shaking on admission. Continued CiWA protocol for withdrawal in hospital. Gave thiamine and folate and consulted SW in hospital.   # High blood pressure -  patient is supposed to take lisinopril only. But he has been taking hydrochlorothiazide and lisinopril combined pill. His blood pressure is running low on admission. After fluid repletion, patient's blood pressure become hypertension. Then, started amlodipine 2.5mg  qd. He is discharged on 2.5 mg of amlodipine at discharge.  # Neck pain: resolved.  It is most likely due to the muscle straining and degenerative disc disease. He has pain over the paraspinal area. There is no tenderness over the midline. His previous  X-ray on 11/19/10 showed reversal of cervical spine curvature with degenerative disc disease at C5-6 and C6-7. There is no focal neurologic signs. No weakness or numbness in the arms or hands. Patient was treated with flexeril. At discharge, his neck pain has completely resolved.  # Acute renal failure: resolved.   Etiology is not clear. Differential diagnosis includes ACEI, pre-renal failure, rhabdomyolysis, substance abuse such as ethylene glycol. Patient's CK is slightly elevated (259). FeNa is 2.0, indicating patient could have ATN. However patient is responding to the fluid repletion very well. Patient's urinalysis is negative for UTI. At discharge, his Cre is normal.   # Elevated AG: resolved.  AG=22 on admission. Patient's bicarbonate is 25 on admission. Patient did not have acidosis on ABG. Etiology is not clear right now. Patient  lactase is normal. It is most likely due to acute renal failure,  Hyperphosphatemia and rhabdomyolysis. It is less likely due to vomiting given his elevated AG. But the vomiting could contribute to his hypochloremia. At discharge, his AG is closed (10).   #: Abnormal EKG: T wave inversion in inferior leads and V4 to V6. Possible ST elevation in V2 and V3. Patient did not  have chest pain, shortness of breath and palpitation. His first 3 sets of troponin were negative.   repeated EKG is improving. Patient has not had any chest pain, shortness of breath and palpitation. His 2-D echo is negative for wall motion abnormality.  He is discharged at stable condition and follow up in clinic.  # hypokalemia: K is 3.4 which is repleted before discharge.     DISCHARGE DATA: Vital Signs: BP 120/78  Pulse 89  Temp 99 F (37.2 C) (Oral)  Resp 18  Ht 5\' 5"  (1.651 m)  Wt 119 lb 7.8 oz (54.2 kg)  BMI 19.88 kg/m2  SpO2 100%  Labs: Results for orders placed during the hospital encounter of 07/27/11 (from the past 24 hour(s))  CARDIAC PANEL(CRET KIN+CKTOT+MB+TROPI)     Status: Abnormal   Collection Time   07/28/11  5:49 PM      Component Value Range   Total CK 249 (*) 7 - 232 U/L   CK, MB 2.1  0.3 - 4.0 ng/mL   Troponin I <0.30  <0.30 ng/mL   Relative Index 0.8  0.0 - 2.5  BASIC METABOLIC PANEL     Status: Abnormal   Collection Time   07/29/11  6:40 AM      Component Value Range   Sodium 135  135 - 145 mEq/L   Potassium 3.4 (*) 3.5 - 5.1 mEq/L   Chloride 100  96 - 112 mEq/L   CO2 25  19 - 32 mEq/L   Glucose, Bld 87  70 - 99 mg/dL   BUN 17  6 - 23 mg/dL   Creatinine, Ser 1.61  0.50 - 1.35 mg/dL   Calcium 8.2 (*) 8.4 - 10.5 mg/dL   GFR calc non Af Amer >90  >90 mL/min   GFR calc Af Amer >90  >90 mL/min  MAGNESIUM     Status: Abnormal   Collection Time   07/29/11  6:40 AM      Component Value Range   Magnesium 1.4 (*) 1.5 - 2.5 mg/dL  PHOSPHORUS     Status: Abnormal   Collection Time   07/29/11   6:40 AM      Component Value Range   Phosphorus 2.2 (*) 2.3 - 4.6 mg/dL  PHENYTOIN LEVEL, TOTAL     Status: Abnormal   Collection Time   07/29/11  6:40 AM      Component Value Range   Phenytoin Lvl 5.5 (*) 10.0 - 20.0 ug/mL     Time Spent on Discharge: 35 min   Signed: Lorretta Harp, MD PGY I, Internal Medicine Resident 07/29/2011, 2:46 PM

## 2011-07-29 NOTE — Progress Notes (Signed)
Discussed discharge instructions with pt. Pt showed no barriers to discharge. Assessment unchanged from morning. IV removed. Tele removed. Pt discharged to home with mother.

## 2011-07-29 NOTE — Clinical Social Work Psychosocial (Signed)
     Clinical Social Work Department BRIEF PSYCHOSOCIAL ASSESSMENT 07/29/2011  Patient:  Joseph Underwood, Joseph Underwood     Account Number:  [id number]     Admit date:  07/27/2011  Clinical Social Worker:  Dennison Bulla  Date/Time:  07/29/2011 03:15 PM  Referred by:  Physician  Date Referred:  07/29/2011 Referred for  Substance Abuse   Other Referral:   Interview type:  Patient Other interview type:   Mother present    PSYCHOSOCIAL DATA Living Status:  ALONE Admitted from facility:   Level of care:   Primary support name:  Nannie Primary support relationship to patient:  PARENT Degree of support available:   Strong    CURRENT CONCERNS Current Concerns  Substance Abuse   Other Concerns:    SOCIAL WORK ASSESSMENT / PLAN CSW received referral due to substance abuse referral. CSW reviewed chart and attempted to meet with patient. Patient had visitors present and asked CSW to return at a later time. CSW came back at a different time and patient's mother was present. Patient reported that he wanted mother to be involved with assessment.    CSW introduced myself and explained role. CSW and patient established rapport by speaking about his children and family. CSW and patient discussed his drinking habits. Patient reports that he drinks about 2 beers every once in awhile. Patient reports that he does not drink on a daily basis and reports that he did not drink at all about 3 days before admission. Patient reports that he does not "have any problems with drinking beer". Patient is aware of negative affects of using alcohol and showed good insight in regards to side effects of using alcohol while taking his medication. Patient refused SBIRT. Patient refused community resources and reports that he can easily stop drinking.    CSW offered community resources and patient reports that he is worried about his hospital bill. CSW provided patient with flyer on "information about paying your bill". CSW is  signing off but available if needed.   Assessment/plan status:  No Further Intervention Required Other assessment/ plan:   Refused SBIRT   Information/referral to community resources:   "Information about paying your bill" Flyer    PATIENTS/FAMILYS RESPONSE TO PLAN OF CARE: Patient was alert and oriented. Patient was dressed and sitting in chair. Patient appears to minimize his alcohol use and refused any resources. Patient was not receptive to discussing alcohol use. Patient reports that mother is a good support and assists him with needs.

## 2011-08-01 LAB — METHYLMALONIC ACID, SERUM: Methylmalonic Acid, Quantitative: 0.31 umol/L (ref <0.40)

## 2011-08-02 ENCOUNTER — Encounter: Payer: Self-pay | Admitting: Internal Medicine

## 2011-10-28 ENCOUNTER — Encounter: Payer: Self-pay | Admitting: Internal Medicine

## 2011-10-28 ENCOUNTER — Ambulatory Visit (INDEPENDENT_AMBULATORY_CARE_PROVIDER_SITE_OTHER): Payer: Self-pay | Admitting: Internal Medicine

## 2011-10-28 VITALS — BP 133/86 | HR 106 | Temp 98.0°F | Ht 65.0 in | Wt 116.5 lb

## 2011-10-28 DIAGNOSIS — R569 Unspecified convulsions: Secondary | ICD-10-CM

## 2011-10-28 DIAGNOSIS — I10 Essential (primary) hypertension: Secondary | ICD-10-CM

## 2011-10-28 DIAGNOSIS — F1020 Alcohol dependence, uncomplicated: Secondary | ICD-10-CM

## 2011-10-28 NOTE — Progress Notes (Signed)
Subjective:     Patient ID: Joseph Underwood, male   DOB: 06-11-1965, 46 y.o.   MRN: [id number]  HPI The patient is a 46 YO man who comes into the clinic for a follow up visit. He has history of alcohol use and seizures for which he is on dilantin. He is accompanied by his mother at today's visit. He is still having occasional hiccups for which ranitidine is not helping. He has not had seizure since admission in June and dosing of his medication was changed. He is not having any other complaints at today's visit. He has not been taking his lisinopril since June and is not on any blood pressure medications at this time. No headaches, seizures, SOB, chest pain. No nausea, vomiting, diarrhea.   Review of Systems  Constitutional: Negative for fever, chills, diaphoresis, activity change, appetite change, fatigue and unexpected weight change.  Respiratory: Negative for cough, chest tightness, shortness of breath and wheezing.   Cardiovascular: Negative for chest pain, palpitations and leg swelling.  Gastrointestinal: Negative.  Negative for nausea, vomiting, abdominal pain, diarrhea, constipation and abdominal distention.  Musculoskeletal: Negative.   Skin: Negative.   Neurological: Negative for dizziness, tremors, seizures, syncope, facial asymmetry, speech difficulty, weakness, light-headedness, numbness and headaches.  Psychiatric/Behavioral: Negative.        Objective:   Physical Exam  Constitutional: He is oriented to person, place, and time. He appears well-developed and well-nourished.  HENT:  Head: Normocephalic and atraumatic.  Eyes: EOM are normal. Pupils are equal, round, and reactive to light.  Neck: Normal range of motion. Neck supple.  Cardiovascular: Regular rhythm.   No murmur heard.      Slightly fast rate.  Pulmonary/Chest: Effort normal and breath sounds normal. No respiratory distress. He has no wheezes. He has no rales. He exhibits no tenderness.  Abdominal: Soft. Bowel  sounds are normal. He exhibits no distension. There is no tenderness. There is no rebound and no guarding.  Musculoskeletal: Normal range of motion. He exhibits no edema and no tenderness.  Neurological: He is alert and oriented to person, place, and time. No cranial nerve deficit.  Skin: Skin is warm and dry.       Assessment/Plan:   1. Please see problem oriented charting.  2. Disposition - The patient will be seen back in 6 months. Will keep him off lisinopril as BP is controlled off meds and will follow and restart as needed. No change to regimen today. Ranitidine can be taken prn rather than scheduled. Will recheck dilantin level when he returns.

## 2011-10-28 NOTE — Assessment & Plan Note (Signed)
Patient is on dilantin but unclear if these were alcohol related or actual seizure disorder. Will recheck level at next visit.

## 2011-10-28 NOTE — Patient Instructions (Signed)
We are seeing you today for a check up and your blood pressure is great! We are going to leave you off blood pressure medicines. Keep taking your seizure medicines and call us if you have any problems. Our number is [phone]. Come back in 6 months if you are not having any problems. If you are feeling sick feel free to call us and be seen sooner.   Smoking Cessation This document explains the best ways for you to quit smoking and new treatments to help. It lists new medicines that can double or triple your chances of quitting and quitting for good. It also considers ways to avoid relapses and concerns you may have about quitting, including weight gain. NICOTINE: A POWERFUL ADDICTION If you have tried to quit smoking, you know how hard it can be. It is hard because nicotine is a very addictive drug. For some people, it can be as addictive as heroin or cocaine. Usually, people make 2 or 3 tries, or more, before finally being able to quit. Each time you try to quit, you can learn about what helps and what hurts. Quitting takes hard work and a lot of effort, but you can quit smoking. QUITTING SMOKING IS ONE OF THE MOST IMPORTANT THINGS YOU WILL EVER DO.  You will live longer, feel better, and live better.   The impact on your body of quitting smoking is felt almost immediately:   Within 20 minutes, blood pressure decreases. Pulse returns to its normal level.   After 8 hours, carbon monoxide levels in the blood return to normal. Oxygen level increases.   After 24 hours, chance of heart attack starts to decrease. Breath, hair, and body stop smelling like smoke.   After 48 hours, damaged nerve endings begin to recover. Sense of taste and smell improve.   After 72 hours, the body is virtually free of nicotine. Bronchial tubes relax and breathing becomes easier.   After 2 to 12 weeks, lungs can hold more air. Exercise becomes easier and circulation improves.   Quitting will reduce your risk of  having a heart attack, stroke, cancer, or lung disease:   After 1 year, the risk of coronary heart disease is cut in half.   After 5 years, the risk of stroke falls to the same as a nonsmoker.   After 10 years, the risk of lung cancer is cut in half and the risk of other cancers decreases significantly.   After 15 years, the risk of coronary heart disease drops, usually to the level of a nonsmoker.   If you are pregnant, quitting smoking will improve your chances of having a healthy baby.   The people you live with, especially your children, will be healthier.   You will have extra money to spend on things other than cigarettes.  FIVE KEYS TO QUITTING Studies have shown that these 5 steps will help you quit smoking and quit for good. You have the best chances of quitting if you use them together: 1. Get ready.  2. Get support and encouragement.  3. Learn new skills and behaviors.  4. Get medicine to reduce your nicotine addiction and use it correctly.  5. Be prepared for relapse or difficult situations. Be determined to continue trying to quit, even if you do not succeed at first.  1. GET READY  Set a quit date.   Change your environment.   Get rid of ALL cigarettes, ashtrays, matches, and lighters in your home, car, and place of  work.   Do not let people smoke in your home.   Review your past attempts to quit. Think about what worked and what did not.   Once you quit, do not smoke. NOT EVEN A PUFF!  2. GET SUPPORT AND ENCOURAGEMENT Studies have shown that you have a better chance of being successful if you have help. You can get support in many ways.  Tell your family, friends, and coworkers that you are going to quit and need their support. Ask them not to smoke around you.   Talk to your caregivers (doctor, dentist, nurse, pharmacist, psychologist, and/or smoking counselor).   Get individual, group, or telephone counseling and support. The more counseling you have, the  better your chances are of quitting. Programs are available at [employer] and health centers. Call your local health department for information about programs in your area.   Spiritual beliefs and practices may help some smokers quit.   Quit meters are Photographer that keep track of quit statistics, such as amount of "quit-time," cigarettes not smoked, and money saved.   Many smokers find one or more of the many self-help books available useful in helping them quit and stay off tobacco.  3. LEARN NEW SKILLS AND BEHAVIORS  Try to distract yourself from urges to smoke. Talk to someone, go for a walk, or occupy your time with a task.   When you first try to quit, change your routine. Take a different route to work. Drink tea instead of coffee. Eat breakfast in a different place.   Do something to reduce your stress. Take a hot bath, exercise, or read a book.   Plan something enjoyable to do every day. Reward yourself for not smoking.   Explore interactive web-based programs that specialize in helping you quit.  4. GET MEDICINE AND USE IT CORRECTLY Medicines can help you stop smoking and decrease the urge to smoke. Combining medicine with the above behavioral methods and support can quadruple your chances of successfully quitting smoking. The U[place]. Food and Drug Administration (FDA) has approved 7 medicines to help you quit smoking. These medicines fall into 3 categories.  Nicotine replacement therapy (delivers nicotine to your body without the negative effects and risks of smoking):   Nicotine gum: Available over-the-counter.   Nicotine lozenges: Available over-the-counter.   Nicotine inhaler: Available by prescription.   Nicotine nasal spray: Available by prescription.   Nicotine skin patches (transdermal): Available by prescription and over-the-counter.   Antidepressant medicine (helps people abstain from smoking, but how this works is unknown):    Bupropion sustained-release (SR) tablets: Available by prescription.   Nicotinic receptor partial agonist (simulates the effect of nicotine in your brain):   Varenicline tartrate tablets: Available by prescription.   Ask your caregiver for advice about which medicines to use and how to use them. Carefully read the information on the package.   Everyone who is trying to quit may benefit from using a medicine. If you are pregnant or trying to become pregnant, nursing an infant, you are under age 2, or you smoke fewer than 10 cigarettes per day, talk to your caregiver before taking any nicotine replacement medicines.   You should stop using a nicotine replacement product and call your caregiver if you experience nausea, dizziness, weakness, vomiting, fast or irregular heartbeat, mouth problems with the lozenge or gum, or redness or swelling of the skin around the patch that does not go away.   Do not use  any other product containing nicotine while using a nicotine replacement product.   Talk to your caregiver before using these products if you have diabetes, heart disease, asthma, stomach ulcers, you had a recent heart attack, you have high blood pressure that is not controlled with medicine, a history of irregular heartbeat, or you have been prescribed medicine to help you quit smoking.  5. BE PREPARED FOR RELAPSE OR DIFFICULT SITUATIONS  Most relapses occur within the first 3 months after quitting. Do not be discouraged if you start smoking again. Remember, most people try several times before they finally quit.   You may have symptoms of withdrawal because your body is used to nicotine. You may crave cigarettes, be irritable, feel very hungry, cough often, get headaches, or have difficulty concentrating.   The withdrawal symptoms are only temporary. They are strongest when you first quit, but they will go away within 10 to 14 days.  Here are some difficult situations to watch  for:  Alcohol. Avoid drinking alcohol. Drinking lowers your chances of successfully quitting.   Caffeine. Try to reduce the amount of caffeine you consume. It also lowers your chances of successfully quitting.   Other smokers. Being around smoking can make you want to smoke. Avoid smokers.   Weight gain. Many smokers will gain weight when they quit, usually less than 10 pounds. Eat a healthy diet and stay active. Do not let weight gain distract you from your main goal, quitting smoking. Some medicines that help you quit smoking may also help delay weight gain. You can always lose the weight gained after you quit.   Bad mood or depression. There are a lot of ways to improve your mood other than smoking.  If you are having problems with any of these situations, talk to your caregiver. SPECIAL SITUATIONS AND CONDITIONS Studies suggest that everyone can quit smoking. Your situation or condition can give you a special reason to quit.  Pregnant women/new mothers: By quitting, you protect your baby's health and your own.   Hospitalized patients: By quitting, you reduce health problems and help healing.   Heart attack patients: By quitting, you reduce your risk of a second heart attack.   Lung, head, and neck cancer patients: By quitting, you reduce your chance of a second cancer.   Parents of children and adolescents: By quitting, you protect your children from illnesses caused by secondhand smoke.  QUESTIONS TO THINK ABOUT Think about the following questions before you try to stop smoking. You may want to talk about your answers with your caregiver.  Why do you want to quit?   If you tried to quit in the past, what helped and what did not?   What will be the most difficult situations for you after you quit? How will you plan to handle them?   Who can help you through the tough times? Your family? Friends? Caregiver?   What pleasures do you get from smoking? What ways can you still get  pleasure if you quit?  Here are some questions to ask your caregiver:  How can you help me to be successful at quitting?   What medicine do you think would be best for me and how should I take it?   What should I do if I need more help?   What is smoking withdrawal like? How can I get information on withdrawal?  Quitting takes hard work and a lot of effort, but you can quit smoking. FOR MORE INFORMATION  Smokefree.gov (http://www.davis-sullivan.com/) provides free, accurate, evidence-based information and professional assistance to help support the immediate and long-term needs of people trying to quit smoking. Document Released: 01/25/2001 Document Revised: 01/20/2011 Document Reviewed: 11/17/2008 [facility] Patient Information 2012 [place], [place].

## 2011-10-28 NOTE — Assessment & Plan Note (Signed)
Blood pressure has been high in the past but has been low lately and has been off medication for 3 months with no high pressure today. Will continue to monitor and add back medication as needed if needed in the future.

## 2011-10-28 NOTE — Assessment & Plan Note (Signed)
Patient unwilling to discuss in front of his mother but advised cessation.

## 2011-12-19 ENCOUNTER — Other Ambulatory Visit: Payer: Self-pay | Admitting: Internal Medicine

## 2012-09-24 ENCOUNTER — Other Ambulatory Visit: Payer: Self-pay | Admitting: Internal Medicine

## 2012-09-25 NOTE — Telephone Encounter (Signed)
Please schedule a follow-up appointment with patient's PCP. 

## 2012-10-25 ENCOUNTER — Emergency Department (HOSPITAL_COMMUNITY)
Admission: EM | Admit: 2012-10-25 | Discharge: 2012-10-25 | Disposition: A | Discharge status: Home / Self Care | Payer: Self-pay | Attending: Emergency Medicine | Admitting: Emergency Medicine

## 2012-10-25 ENCOUNTER — Encounter (HOSPITAL_COMMUNITY): Payer: Self-pay | Admitting: Emergency Medicine

## 2012-10-25 DIAGNOSIS — G40909 Epilepsy, unspecified, not intractable, without status epilepticus: Secondary | ICD-10-CM | POA: Insufficient documentation

## 2012-10-25 DIAGNOSIS — T63461A Toxic effect of venom of wasps, accidental (unintentional), initial encounter: Secondary | ICD-10-CM | POA: Insufficient documentation

## 2012-10-25 DIAGNOSIS — Y929 Unspecified place or not applicable: Secondary | ICD-10-CM | POA: Insufficient documentation

## 2012-10-25 DIAGNOSIS — I10 Essential (primary) hypertension: Secondary | ICD-10-CM | POA: Insufficient documentation

## 2012-10-25 DIAGNOSIS — T6391XA Toxic effect of contact with unspecified venomous animal, accidental (unintentional), initial encounter: Secondary | ICD-10-CM | POA: Insufficient documentation

## 2012-10-25 DIAGNOSIS — Z79899 Other long term (current) drug therapy: Secondary | ICD-10-CM | POA: Insufficient documentation

## 2012-10-25 DIAGNOSIS — F17200 Nicotine dependence, unspecified, uncomplicated: Secondary | ICD-10-CM | POA: Insufficient documentation

## 2012-10-25 DIAGNOSIS — Y939 Activity, unspecified: Secondary | ICD-10-CM | POA: Insufficient documentation

## 2012-10-25 MED ORDER — DIPHENHYDRAMINE HCL 50 MG/ML IJ SOLN
25.0000 mg | Freq: Once | INTRAMUSCULAR | Status: AC
  Administered 2012-10-25: 25 mg via INTRAMUSCULAR
  Filled 2012-10-25: qty 1

## 2012-10-25 MED ORDER — KETOROLAC TROMETHAMINE 30 MG/ML IJ SOLN
30.0000 mg | Freq: Once | INTRAMUSCULAR | Status: AC
  Administered 2012-10-25: 30 mg via INTRAMUSCULAR
  Filled 2012-10-25: qty 1

## 2012-10-25 NOTE — ED Notes (Signed)
Pt. Glasses are broken so visual acuity was not able to be performed accurately.

## 2012-10-25 NOTE — ED Provider Notes (Signed)
CSN: [id number]     Arrival date & time 10/25/12  1439 History   This chart was scribed for non-physician practitioner Georgeanna Harrison, working with Glynn Octave, MD by Dorothey Baseman, ED Scribe. This patient was seen in room TR08C/TR08C and the patient's care was started at 4:18 PM.    Chief Complaint  Patient presents with  . Insect Bite   The history is provided by the patient. No language interpreter was used.   HPI Comments: Joseph Underwood is a 47 y.o. male who presents to the Emergency Department complaining of an insect bite, possibly bees or yellow jackets, above his left eye around 11:00AM today, or about 5 hours ago, with associated, moderate pain and swelling to the area. Patient denies any vision changes, drainage from the eye,  or difficulty breathing.  Denies swelling of the lips, tongue, or throat.  He denies any history of past allergic reactions.  No treatment prior to arrival.  Past Medical History  Diagnosis Date  . Seizures   . Alcohol abuse   . Hiccups   . HTN (hypertension)    Past Surgical History  Procedure Laterality Date  . No past surgeries     History reviewed. No pertinent family history. History  Substance Use Topics  . Smoking status: Current Every Day Smoker -- 0.50 packs/day for 25 years    Types: Cigarettes  . Smokeless tobacco: Never Used  . Alcohol Use: Yes     Comment: six pack a day, fifth of gin per week    Review of Systems  A complete 10 system review of systems was obtained and all systems are negative except as noted in the HPI and PMH.   Allergies  Review of patient's allergies indicates no known allergies.  Home Medications   Current Outpatient Rx  Name  Route  Sig  Dispense  Refill  . phenytoin (DILANTIN) 100 MG ER capsule   Oral   Take 100 mg by mouth 3 (three) times daily.          Triage Vitals: BP 170/112  Pulse 118  Temp(Src) 98.1 F (36.7 C) (Oral)  Resp 16  SpO2 96%  Physical Exam  Nursing note and  vitals reviewed. Constitutional: He is oriented to person, place, and time. He appears well-developed and well-nourished. No distress.  HENT:  Head: Normocephalic and atraumatic.  Mouth/Throat: Oropharynx is clear and moist.  Mild left periorbital edema. No swelling of lips, tongue, or throat.  Eyes: Conjunctivae and EOM are normal. Pupils are equal, round, and reactive to light.  Sclera is non-injected.  Neck: Normal range of motion. Neck supple.  Cardiovascular: Normal rate, regular rhythm and normal heart sounds.   Pulmonary/Chest: Effort normal and breath sounds normal. No stridor. No respiratory distress. He has no wheezes.  Musculoskeletal: Normal range of motion.  Neurological: He is alert and oriented to person, place, and time.  Skin: Skin is warm and dry.  Psychiatric: He has a normal mood and affect. His behavior is normal.    ED Course  Procedures (including critical care time)  Medications  diphenhydrAMINE (BENADRYL) injection 25 mg (25 mg Intramuscular Given 10/25/12 1637)  ketorolac (TORADOL) 30 MG/ML injection 30 mg (30 mg Intramuscular Given 10/25/12 1637)   DIAGNOSTIC STUDIES: Oxygen Saturation is 96% on room air, normal by my interpretation.    COORDINATION OF CARE: 4:21PM- Will order an injection of Benadryl and Toradol to treat swelling. Advised patient to apply ice to the area and  to follow up with a PCP if there are any new or worsening symptoms. Discussed treatment plan with patient at bedside and patient verbalized agreement.     Labs Review Labs Reviewed - No data to display Imaging Review No results found.  MDM  No diagnosis found. Patient presenting with a yellow jacket sting.  Patient with localized inflammatory response.  No swelling of the lips, tongue, or throat.  No SOB.  Patient given Benadryl and Toradol in the ED and also instructed to apply ice to the area.  Patient stable for discharge.  Return precautions given.  I personally performed the  services described in this documentation, which was scribed in my presence. The recorded information has been reviewed and is accurate.    Pascal Lux [place], PA-C 10/31/12 [phone]

## 2012-10-25 NOTE — ED Notes (Addendum)
Pt here with yellow jacket sting x 2 above left eye; swelling noted to area; pt sts happened about an hour ago

## 2012-10-25 NOTE — ED Notes (Signed)
Pt stung on forehead x 2 by yellow jackets. Left periorbital swelling. Denies SOB or tongue/mouth swelling.

## 2012-10-31 NOTE — ED Provider Notes (Signed)
Medical screening examination/treatment/procedure(s) were performed by non-physician practitioner and as supervising physician I was immediately available for consultation/collaboration.   Kota Ciancio, MD 10/31/12 1202 

## 2012-12-31 ENCOUNTER — Encounter: Payer: Self-pay | Admitting: Internal Medicine

## 2012-12-31 ENCOUNTER — Ambulatory Visit (INDEPENDENT_AMBULATORY_CARE_PROVIDER_SITE_OTHER): Payer: Self-pay | Admitting: Internal Medicine

## 2012-12-31 VITALS — BP 160/100 | HR 119 | Temp 98.7°F | Wt 115.8 lb

## 2012-12-31 DIAGNOSIS — F1020 Alcohol dependence, uncomplicated: Secondary | ICD-10-CM

## 2012-12-31 DIAGNOSIS — I10 Essential (primary) hypertension: Secondary | ICD-10-CM

## 2012-12-31 DIAGNOSIS — Z23 Encounter for immunization: Secondary | ICD-10-CM

## 2012-12-31 DIAGNOSIS — E538 Deficiency of other specified B group vitamins: Secondary | ICD-10-CM

## 2012-12-31 DIAGNOSIS — R569 Unspecified convulsions: Secondary | ICD-10-CM

## 2012-12-31 MED ORDER — HYDROCHLOROTHIAZIDE 12.5 MG PO CAPS: 12.5000 mg | ORAL_CAPSULE | Freq: Every day | ORAL | Status: DC

## 2012-12-31 NOTE — Progress Notes (Signed)
Subjective:     Patient ID: Joseph Underwood, male   DOB: 1965/08/20, 47 y.o.   MRN: [id number]  HPI The patient is a 47 YO man who comes into the clinic for a follow up visit. He has history of alcohol use and seizures for which he is on dilantin. He was previously on blood pressure medication which has since been stopped due to normal blood pressures off medication. No headaches, seizures, SOB, chest pain. No nausea, vomiting, diarrhea. He does occasionally get episodes of hiccups which are self-limited. He states that he is drinking more lately and last drank yesterday about 5-6 beers.   Review of Systems  Constitutional: Negative for fever, chills, diaphoresis, activity change, appetite change, fatigue and unexpected weight change.  Respiratory: Negative for cough, chest tightness, shortness of breath and wheezing.   Cardiovascular: Negative for chest pain, palpitations and leg swelling.  Gastrointestinal: Negative.  Negative for nausea, vomiting, abdominal pain, diarrhea, constipation and abdominal distention.  Musculoskeletal: Negative.   Skin: Negative.   Neurological: Negative for dizziness, tremors, seizures, syncope, facial asymmetry, speech difficulty, weakness, light-headedness, numbness and headaches.  Psychiatric/Behavioral: Negative.        Objective:   Physical Exam  Constitutional: He is oriented to person, place, and time. He appears well-developed and well-nourished.  HENT:  Head: Normocephalic and atraumatic.  Eyes: EOM are normal. Pupils are equal, round, and reactive to light.  Neck: Normal range of motion. Neck supple.  Cardiovascular: Regular rhythm.   No murmur heard. Pulmonary/Chest: Effort normal and breath sounds normal. No respiratory distress. He has no wheezes. He has no rales. He exhibits no tenderness.  Abdominal: Soft. Bowel sounds are normal. He exhibits no distension. There is no tenderness. There is no rebound and no guarding.  Musculoskeletal: Normal  range of motion. He exhibits no edema and no tenderness.  Neurological: He is alert and oriented to person, place, and time. No cranial nerve deficit.  Skin: Skin is warm and dry.       Assessment/Plan:   1. Please see problem oriented charting.  2. Disposition - The patient will be seen back in 4-6 weeks. Will add HCTZ 12.5 mg daily. He will talk to financial counselor about orange card. Check dilantin level today. Flu shot given today. On return visit (hopefully with orange card coverage) will get anemia panel, CBC, BMP and give B12 injection given history of low values in the past.

## 2012-12-31 NOTE — Patient Instructions (Signed)
We will start you back on a blood pressure medicine today called HCTZ. Take 1 pill per day.  We would like you to come back in about 4-6 weeks to check on your blood pressure. I have included a list of most of the things that you need to get the orange card to help with medical expenses.   Call us with questions or problems at [phone].  Items required to complete an Eligibility Application   1. Picture ID (Can't be expired) 2. Current Bill to establish proof of residency 3. W-2 & Tax return (if self-employed include "Schedule C"), if not filing Form 4506 4. 4 current Pay stubs for this year 5. Printout of other income (Social security, unemployment, child support, workmen's comp) 6. Food stamp award letter, if receiving  7. Life Insurance (Need copy of the front page, showing name Ins Co. Name, and face amount). 8. Statement for pension, 401-K, IRS (needs to have current balance) 9. Tax Value for cars, houses, mobile homes, and land (Get from [facility] Tax Department) 10. Disability Paperwork (showing status of case) 11. College students: Print out of [employer] received, tuition cost, books, etc. 12. If no Income: Engineer, maintenance of support for free shelter, money, food, Catering manager.  Bring all that you can to your follow up appointment to start the process.

## 2013-01-01 ENCOUNTER — Encounter: Payer: Self-pay | Admitting: Internal Medicine

## 2013-01-01 DIAGNOSIS — E538 Deficiency of other specified B group vitamins: Secondary | ICD-10-CM | POA: Insufficient documentation

## 2013-01-01 DIAGNOSIS — I10 Essential (primary) hypertension: Secondary | ICD-10-CM | POA: Insufficient documentation

## 2013-01-01 NOTE — Assessment & Plan Note (Signed)
Patient appears to be drinking more lately and admits to about 1/5th every other day or 4-6 beers per day. Encouraged more moderation but patients seems ashamed of his behavior.

## 2013-01-01 NOTE — Assessment & Plan Note (Signed)
BP Readings from Last 3 Encounters:  12/31/12 160/100  10/25/12 183/110  10/28/11 133/86    Lab Results  Component Value Date   NA 135 07/29/2011   K 3.4* 07/29/2011   CREATININE 0.92 07/29/2011    Assessment: Blood pressure control: moderately elevated Progress toward BP goal:  deteriorated Comments: patient was off blood pressure medications for some time but with history of medication usage for high blood pressure, likely elevated due to increasing alcohol usage.   Plan: Medications:  add HCTZ 12.5 mg daily Educational resources provided: video Self management tools provided: instructions for home blood pressure monitoring Other plans: will see back for BMP in 4-6 weeks

## 2013-01-01 NOTE — Assessment & Plan Note (Signed)
Looking back through records he has had low B12 levels in the past and not clear if he has been treated. He did have several nutritional deficiencies so may be related to lack of nutrition rather than absorption problem.

## 2013-01-01 NOTE — Assessment & Plan Note (Signed)
Patient without seizures since last visit and continues on dilantin 100 mg TID. Will check dilantin level today. Suspect that his history of seizures has to do with alcohol usage.

## 2013-01-03 NOTE — Progress Notes (Signed)
Case discussed with Dr. Kollar soon after the resident saw the patient.  We reviewed the resident's history and exam and pertinent patient test results.  I agree with the assessment, diagnosis, and plan of care documented in the resident's note. 

## 2013-02-01 ENCOUNTER — Ambulatory Visit (INDEPENDENT_AMBULATORY_CARE_PROVIDER_SITE_OTHER): Payer: Self-pay | Admitting: Internal Medicine

## 2013-02-01 ENCOUNTER — Encounter: Payer: Self-pay | Admitting: Internal Medicine

## 2013-02-01 VITALS — BP 158/100 | HR 122 | Temp 98.5°F | Ht 65.0 in | Wt 113.5 lb

## 2013-02-01 DIAGNOSIS — F1020 Alcohol dependence, uncomplicated: Secondary | ICD-10-CM

## 2013-02-01 DIAGNOSIS — I10 Essential (primary) hypertension: Secondary | ICD-10-CM

## 2013-02-01 DIAGNOSIS — E538 Deficiency of other specified B group vitamins: Secondary | ICD-10-CM

## 2013-02-01 LAB — CBC
HCT: 38.2 % — ABNORMAL LOW (ref 39.0–52.0)
Hemoglobin: 13.4 g/dL (ref 13.0–17.0)
MCH: 33.2 pg (ref 26.0–34.0)
MCHC: 35.1 g/dL (ref 30.0–36.0)
RBC: 4.04 MIL/uL — ABNORMAL LOW (ref 4.22–5.81)

## 2013-02-01 LAB — BASIC METABOLIC PANEL WITH GFR
BUN: 11 mg/dL (ref 6–23)
CO2: 34 mEq/L — ABNORMAL HIGH (ref 19–32)
Calcium: 9.5 mg/dL (ref 8.4–10.5)
GFR, Est African American: >89 mL/min
Glucose, Bld: 106 mg/dL — ABNORMAL HIGH (ref 70–99)
Potassium: 3.7 mEq/L (ref 3.5–5.3)
Sodium: 138 mEq/L (ref 135–145)

## 2013-02-01 MED ORDER — CYANOCOBALAMIN 1000 MCG/ML IJ SOLN
1000.0000 ug | Freq: Once | INTRAMUSCULAR | Status: AC
  Administered 2013-02-01: 1000 ug via INTRAMUSCULAR

## 2013-02-01 MED ORDER — HYDROCHLOROTHIAZIDE 25 MG PO TABS: 25.0000 mg | ORAL_TABLET | Freq: Every day | ORAL | Status: DC

## 2013-02-01 NOTE — Assessment & Plan Note (Signed)
Patient is still drinking the same amount and does not wish to cut back at this time. He does have some shame associated with his drinking.

## 2013-02-01 NOTE — Patient Instructions (Addendum)
We have given you a shot of vitamin B12 which may help give you more energy since your levels have been low in the past. We are also checking your levels today.  We will see you back in about 2-3 months to check on your blood pressure. We have increased the dose of your blood pressure medicine so be sure to get the higher dose of HCTZ 25 mg daily when you refill your bottle.   Call us with questions or problems before then at [phone].

## 2013-02-01 NOTE — Assessment & Plan Note (Addendum)
BP Readings from Last 3 Encounters:  02/01/13 158/100  12/31/12 160/100  10/25/12 183/110    Lab Results  Component Value Date   NA 135 07/29/2011   K 3.4* 07/29/2011   CREATININE 0.92 07/29/2011    Assessment: Blood pressure control: mildly elevated Progress toward BP goal:  improved Comments:   Plan: Medications:  increase HCTZ to 25 mg daily Educational resources provided: brochure;handout;video Self management tools provided: instructions for home blood pressure monitoring Other plans: if BP still high at next visit will likely switch to combo pill. Have advised him that his drinking is likely increasing his BP.

## 2013-02-01 NOTE — Assessment & Plan Note (Signed)
Given B12 shot today and checking CBC and anemia panel to confirm B12 is still low but has been low several times in the past without documented replacement. Likely nutritional deficiency rather than absorption problem.

## 2013-02-01 NOTE — Progress Notes (Signed)
Subjective:     Patient ID: Joseph Underwood, male   DOB: [birth year], 47 y.o.   MRN: [id number]  HPI The patient is a 47 YO man who comes into the clinic for a follow up visit since starting blood pressure medicine. He has history of alcohol use and seizures for which he is on dilantin. No headaches, seizures, SOB, chest pain. No nausea, vomiting, diarrhea. He states that he is drinking about the same lately and last drank yesterday about 5-6 beers. He is not having any complaints and when asked states that his energy may be down lately. He has never had B12 shots in the past but remembers being told that the level was low several times. He has not noticed any blood in his stools or change in bowel habits.   Review of Systems  Constitutional: Negative for fever, chills, diaphoresis, activity change, appetite change, fatigue and unexpected weight change.  Respiratory: Negative for cough, chest tightness, shortness of breath and wheezing.   Cardiovascular: Negative for chest pain, palpitations and leg swelling.  Gastrointestinal: Negative.  Negative for nausea, vomiting, abdominal pain, diarrhea, constipation and abdominal distention.  Musculoskeletal: Negative.   Skin: Negative.   Neurological: Negative for dizziness, tremors, seizures, syncope, facial asymmetry, speech difficulty, weakness, light-headedness, numbness and headaches.  Psychiatric/Behavioral: Negative.        Objective:   Physical Exam  Constitutional: He is oriented to person, place, and time. He appears well-developed and well-nourished.  HENT:  Head: Normocephalic and atraumatic.  Eyes: EOM are normal. Pupils are equal, round, and reactive to light.  Neck: Normal range of motion. Neck supple.  Cardiovascular: Regular rhythm.   No murmur heard. Pulmonary/Chest: Effort normal and breath sounds normal. No respiratory distress. He has no wheezes. He has no rales. He exhibits no tenderness.  Abdominal: Soft. Bowel sounds are  normal. He exhibits no distension. There is no tenderness. There is no rebound and no guarding.  Musculoskeletal: Normal range of motion. He exhibits no edema and no tenderness.  Neurological: He is alert and oriented to person, place, and time. No cranial nerve deficit.  Skin: Skin is warm and dry.       Assessment/Plan:   1. Please see problem oriented charting.  2. Disposition - The patient will be seen back in 2-3 months. Will give B12 shot today and check anemia panel and CBC and BMP. He will talk to financial counselor about orange card.

## 2013-02-02 LAB — ANEMIA PANEL
ABS Retic: 88.9 10*3/uL (ref 19.0–186.0)
Folate: >20 ng/mL
RBC.: 4.04 MIL/uL — ABNORMAL LOW (ref 4.22–5.81)
Retic Ct Pct: 2.2 % (ref 0.4–2.3)
TIBC: 437 ug/dL — ABNORMAL HIGH (ref 215–435)
UIBC: 294 ug/dL (ref 125–400)

## 2013-02-05 NOTE — Progress Notes (Signed)
Case discussed with Dr. Kollar at time of visit. We reviewed the resident's history and exam and pertinent patient test results. I agree with the assessment, diagnosis, and plan of care documented in the resident's note.  

## 2013-03-30 ENCOUNTER — Emergency Department (HOSPITAL_COMMUNITY)
Admission: EM | Admit: 2013-03-30 | Discharge: 2013-03-30 | Disposition: A | Discharge status: Home / Self Care | Payer: Self-pay | Attending: Emergency Medicine | Admitting: Emergency Medicine

## 2013-03-30 ENCOUNTER — Emergency Department (HOSPITAL_COMMUNITY): Payer: Self-pay

## 2013-03-30 ENCOUNTER — Encounter (HOSPITAL_COMMUNITY): Payer: Self-pay | Admitting: Emergency Medicine

## 2013-03-30 DIAGNOSIS — F1010 Alcohol abuse, uncomplicated: Secondary | ICD-10-CM | POA: Insufficient documentation

## 2013-03-30 DIAGNOSIS — F10929 Alcohol use, unspecified with intoxication, unspecified: Secondary | ICD-10-CM

## 2013-03-30 DIAGNOSIS — R109 Unspecified abdominal pain: Secondary | ICD-10-CM | POA: Insufficient documentation

## 2013-03-30 DIAGNOSIS — F17200 Nicotine dependence, unspecified, uncomplicated: Secondary | ICD-10-CM | POA: Insufficient documentation

## 2013-03-30 DIAGNOSIS — Z79899 Other long term (current) drug therapy: Secondary | ICD-10-CM | POA: Insufficient documentation

## 2013-03-30 DIAGNOSIS — R066 Hiccough: Secondary | ICD-10-CM | POA: Insufficient documentation

## 2013-03-30 DIAGNOSIS — R1110 Vomiting, unspecified: Secondary | ICD-10-CM | POA: Insufficient documentation

## 2013-03-30 DIAGNOSIS — I10 Essential (primary) hypertension: Secondary | ICD-10-CM | POA: Insufficient documentation

## 2013-03-30 DIAGNOSIS — G40909 Epilepsy, unspecified, not intractable, without status epilepticus: Secondary | ICD-10-CM | POA: Insufficient documentation

## 2013-03-30 LAB — COMPREHENSIVE METABOLIC PANEL
ALT: 35 U/L (ref 0–53)
AST: 143 U/L — AB (ref 0–37)
Albumin: 4.2 g/dL (ref 3.5–5.2)
Alkaline Phosphatase: 105 U/L (ref 39–117)
BILIRUBIN TOTAL: 0.4 mg/dL (ref 0.3–1.2)
BUN: 9 mg/dL (ref 6–23)
CHLORIDE: 97 meq/L (ref 96–112)
CO2: 28 meq/L (ref 19–32)
Calcium: 8.6 mg/dL (ref 8.4–10.5)
Creatinine, Ser: 0.6 mg/dL (ref 0.50–1.35)
GFR calc Af Amer: >90 mL/min (ref >90)
Glucose, Bld: 86 mg/dL (ref 70–99)
POTASSIUM: 3.8 meq/L (ref 3.7–5.3)
SODIUM: 144 meq/L (ref 137–147)
Total Protein: 8.5 g/dL — ABNORMAL HIGH (ref 6.0–8.3)

## 2013-03-30 LAB — CBC WITH DIFFERENTIAL/PLATELET
BASOS ABS: 0.1 10*3/uL (ref 0.0–0.1)
Basophils Relative: 2 % — ABNORMAL HIGH (ref 0–1)
Eosinophils Absolute: 0 10*3/uL (ref 0.0–0.7)
Eosinophils Relative: 1 % (ref 0–5)
HEMATOCRIT: 38.1 % — AB (ref 39.0–52.0)
Hemoglobin: 14 g/dL (ref 13.0–17.0)
LYMPHS PCT: 46 % (ref 12–46)
Lymphs Abs: 2.7 10*3/uL (ref 0.7–4.0)
MCH: 35.7 pg — ABNORMAL HIGH (ref 26.0–34.0)
MCHC: 35.7 g/dL (ref 30.0–36.0)
MCV: 97.2 fL (ref 78.0–100.0)
MONO ABS: 0.5 10*3/uL (ref 0.1–1.0)
Monocytes Relative: 8 % (ref 3–12)
NEUTROS ABS: 2.5 10*3/uL (ref 1.7–7.7)
NEUTROS PCT: 43 % (ref 43–77)
PLATELETS: 286 10*3/uL (ref 150–400)
RBC: 3.92 MIL/uL — ABNORMAL LOW (ref 4.22–5.81)
RDW: 14.5 % (ref 11.5–15.5)
WBC: 5.8 10*3/uL (ref 4.0–10.5)

## 2013-03-30 LAB — RAPID URINE DRUG SCREEN, HOSP PERFORMED
AMPHETAMINES: NOT DETECTED
BARBITURATES: NOT DETECTED
BENZODIAZEPINES: NOT DETECTED
COCAINE: NOT DETECTED
Opiates: NOT DETECTED
Tetrahydrocannabinol: NOT DETECTED

## 2013-03-30 LAB — POCT I-STAT TROPONIN I: Troponin i, poc: 0.08 ng/mL (ref 0.00–0.08)

## 2013-03-30 LAB — TROPONIN I
Troponin I: <0.3 ng/mL (ref <0.30)
Troponin I: <0.3 ng/mL (ref <0.30)

## 2013-03-30 LAB — LIPASE, BLOOD: Lipase: 30 U/L (ref 11–59)

## 2013-03-30 LAB — PHENYTOIN LEVEL, TOTAL

## 2013-03-30 LAB — ETHANOL: Alcohol, Ethyl (B): 419 mg/dL (ref 0–11)

## 2013-03-30 MED ORDER — PHENYTOIN SODIUM EXTENDED 100 MG PO CAPS: 100.0000 mg | ORAL_CAPSULE | Freq: Three times a day (TID) | ORAL | Status: DC

## 2013-03-30 MED ORDER — LORAZEPAM 2 MG/ML IJ SOLN
1.0000 mg | Freq: Once | INTRAMUSCULAR | Status: AC
  Administered 2013-03-30: 1 mg via INTRAVENOUS

## 2013-03-30 MED ORDER — LORAZEPAM 2 MG/ML IJ SOLN
1.0000 mg | Freq: Once | INTRAMUSCULAR | Status: DC
  Filled 2013-03-30: qty 1

## 2013-03-30 MED ORDER — ONDANSETRON HCL 4 MG/2ML IJ SOLN
4.0000 mg | Freq: Once | INTRAMUSCULAR | Status: AC
  Administered 2013-03-30: 4 mg via INTRAVENOUS
  Filled 2013-03-30: qty 2

## 2013-03-30 MED ORDER — SODIUM CHLORIDE 0.9 % IV SOLN
1000.0000 mg | Freq: Once | INTRAVENOUS | Status: AC
  Administered 2013-03-30: 1000 mg via INTRAVENOUS
  Filled 2013-03-30: qty 20

## 2013-03-30 MED ORDER — SODIUM CHLORIDE 0.9 % IV SOLN
Freq: Once | INTRAVENOUS | Status: AC
  Administered 2013-03-30: 1000 mL/h via INTRAVENOUS

## 2013-03-30 MED ORDER — HYDROCHLOROTHIAZIDE 25 MG PO TABS: 25.0000 mg | ORAL_TABLET | Freq: Every day | ORAL | Status: DC

## 2013-03-30 MED ORDER — SODIUM CHLORIDE 0.9 % IV SOLN
Freq: Once | INTRAVENOUS | Status: AC
  Administered 2013-03-30: 14:00:00 via INTRAVENOUS

## 2013-03-30 NOTE — Discharge Instructions (Signed)
As discussed, it is very important that you followup with your physician, and discussed several items.  Please discuss your ongoing alcohol use, and options for treatment.  Please discuss your evaluation here, including the episodes of chest pain.  If you develop new, or concerning changes in your condition, please return here immediately for additional evaluation.  Please take all medication as directed.

## 2013-03-30 NOTE — ED Notes (Signed)
Placed PT on [place] 2 liters for dipping 02 sats (70-80s). Repositioned PT in bed and after Robinson placement 02 sats went to 100%

## 2013-03-30 NOTE — ED Provider Notes (Signed)
CSN: [id number]     Arrival date & time 03/30/13  1259 History   First MD Initiated Contact with Patient 03/30/13 1317     Chief Complaint  Patient presents with  . Chest Pain  . Alcohol Problem  . Seizures     (Consider location/radiation/quality/duration/timing/severity/associated sxs/prior Treatment) Patient is a 48 y.o. male presenting with chest pain, alcohol problem, and seizures. The history is provided by the patient. No language interpreter was used.  Chest Pain Pain location:  R chest Pain quality: aching   Pain radiates to:  Does not radiate Pain radiates to the back: no   Pain severity:  Moderate Onset quality:  Gradual Duration:  2 days Timing:  Constant Progression:  Worsening Chronicity:  New Context comment:  After vomitting Relieved by:  Nothing Worsened by:  Nothing tried Ineffective treatments:  None tried Associated symptoms: abdominal pain   Risk factors: hypertension   Alcohol Problem Associated symptoms include abdominal pain and chest pain.  Seizures Pt reports a history of alcohol abuse.  Pt reports he has seizures and gets shakey when he does not drink.  Pt reports he has seizures when he does not drink.   Pt reports he also has hiccups when he does not drink. Pt had a seizure on Thursday,  Hiccups and vomitting today.  Pt complains of feeling shakey.  Pt is not atking his dilantin or blood pressure medications  Past Medical History  Diagnosis Date  . Seizures   . Alcohol abuse   . Hiccups   . HTN (hypertension)    Past Surgical History  Procedure Laterality Date  . No past surgeries     History reviewed. No pertinent family history. History  Substance Use Topics  . Smoking status: Current Every Day Smoker -- 0.50 packs/day for 25 years    Types: Cigarettes  . Smokeless tobacco: Never Used  . Alcohol Use: Yes     Comment: a fifth every other day or 4-5 beers per day    Review of Systems  Cardiovascular: Positive for chest pain.   Gastrointestinal: Positive for abdominal pain.  Neurological: Positive for seizures.  All other systems reviewed and are negative.      Allergies  Review of patient's allergies indicates no known allergies.  Home Medications   Current Outpatient Rx  Name  Route  Sig  Dispense  Refill  . hydrochlorothiazide (HYDRODIURIL) 25 MG tablet   Oral   Take 1 tablet (25 mg total) by mouth daily.   30 tablet   6   . phenytoin (DILANTIN) 100 MG ER capsule   Oral   Take 100 mg by mouth 3 (three) times daily.          BP 220/110  Pulse 119  Temp(Src) 98.1 F (36.7 C) (Oral)  Resp 22  SpO2 98% Physical Exam  Nursing note and vitals reviewed. Constitutional: He is oriented to person, place, and time. He appears well-developed and well-nourished.  HENT:  Head: Normocephalic and atraumatic.  Right Ear: External ear normal.  Left Ear: External ear normal.  Eyes: Conjunctivae and EOM are normal. Pupils are equal, round, and reactive to light.  Neck: Normal range of motion. Neck supple.  Cardiovascular: Normal rate and normal heart sounds.   Pulmonary/Chest: Effort normal.  Abdominal: Soft. He exhibits no distension.  Musculoskeletal: Normal range of motion.  Neurological: He is alert and oriented to person, place, and time.  Skin: Skin is warm.  Psychiatric: He has a normal mood and  affect.    ED Course  Procedures (including critical care time) Labs Review Labs Reviewed  CBC WITH DIFFERENTIAL  COMPREHENSIVE METABOLIC PANEL  LIPASE, BLOOD  TROPONIN I  ETHANOL  URINE RAPID DRUG SCREEN (HOSP PERFORMED)   Imaging Review No results found.  EKG Interpretation   None     Pt given IV ativan, Iv fluids,  Labs pending  MDM  EKG sinus tachycardia.  116.    Final diagnoses:  None    Pt's etoh  Is 419.   Dilantin level pending.   I will obtain second troponin. Pt resting comfortably after ativan,     Fransico Meadow, PA-C 03/30/13 [place],  [place] 03/30/13 1450

## 2013-03-30 NOTE — ED Notes (Signed)
NOTIFIED DR. LOCKWOOD IN PERSON OF PATIENTS LAB RESULTS OF I-STAT TROPONIN ,@19 :08 PM ,03/30/2013.

## 2013-03-30 NOTE — ED Notes (Addendum)
To ED via [facility] from home with c/o right sided chest pain that starts after having hiccups-- pt states gets the hiccups when he is not drinking-- drinks 1/2 pint QOD--states "I don't feel good unless I drink-- then my shaking stops." ADmits to having a seizure on Thursday. Not taking medicine for a "couple weeks because I have been drinking and it doesn't mix". Alert/Oriented x 3, w/d, strong ETOH odor on pt--sweating obvious across nose.

## 2013-03-30 NOTE — ED Notes (Signed)
Noticed some angioedema, particularly on R side of upper eye lid. MD notified via Magda Paganini, RN

## 2013-03-30 NOTE — ED Provider Notes (Signed)
7:00 PM On repeat exam, the patient awakens easily, speaks clearly, answers questions appropriately.  He has no chest pain, no visual changes. Patient had previously complained of blurry vision, though he now states that he has had similar symptoms for a long time intermittently. We discussed his alcohol abuse, the need for medication compliance. Repeat troponin is pending the   7:39 PM Repeat (formal) troponin is unremarkable.  Patient will be d/c into the custody of his mother.    Carmin Muskrat, MD 03/30/13 [phone]

## 2013-03-30 NOTE — ED Provider Notes (Signed)
Medical screening examination/treatment/procedure(s) were performed by non-physician practitioner and as supervising physician I was immediately available for consultation/collaboration.  EKG Interpretation    Date/Time:  Saturday March 30 2013 13:08:20 EST Ventricular Rate:  116 PR Interval:  155 QRS Duration: 74 QT Interval:  341 QTC Calculation: 474 R Axis:   105 Text Interpretation:  Sinus tachycardia Biatrial enlargement Anterolateral infarct, old Confirmed by Alvino Chapel  MD, Jaelin Fackler ([id number]) on 03/30/2013 3:28:28 PM             Jasper Riling. Alvino Chapel, MD 03/30/13 [phone]

## 2013-03-30 NOTE — ED Notes (Signed)
Called lab to draw PT's troponin

## 2013-04-23 ENCOUNTER — Other Ambulatory Visit: Payer: Self-pay

## 2013-04-23 ENCOUNTER — Encounter: Payer: Self-pay | Admitting: Internal Medicine

## 2013-04-23 ENCOUNTER — Emergency Department (HOSPITAL_COMMUNITY): Payer: Self-pay

## 2013-04-23 ENCOUNTER — Ambulatory Visit (INDEPENDENT_AMBULATORY_CARE_PROVIDER_SITE_OTHER): Payer: Self-pay | Admitting: Internal Medicine

## 2013-04-23 ENCOUNTER — Encounter (HOSPITAL_COMMUNITY): Payer: Self-pay | Admitting: Emergency Medicine

## 2013-04-23 ENCOUNTER — Inpatient Hospital Stay (HOSPITAL_COMMUNITY)
Admission: EM | Admit: 2013-04-23 | Discharge: 2013-04-27 | DRG: 871 | Discharge status: Against Medical Advice | Payer: Self-pay | Attending: Internal Medicine | Admitting: Internal Medicine

## 2013-04-23 VITALS — BP 180/90 | HR 140 | Temp 97.8°F | Ht 65.0 in | Wt 116.3 lb

## 2013-04-23 DIAGNOSIS — F10231 Alcohol dependence with withdrawal delirium: Secondary | ICD-10-CM | POA: Diagnosis present

## 2013-04-23 DIAGNOSIS — I10 Essential (primary) hypertension: Secondary | ICD-10-CM

## 2013-04-23 DIAGNOSIS — Z720 Tobacco use: Secondary | ICD-10-CM | POA: Diagnosis present

## 2013-04-23 DIAGNOSIS — E8729 Other acidosis: Secondary | ICD-10-CM | POA: Diagnosis present

## 2013-04-23 DIAGNOSIS — J690 Pneumonitis due to inhalation of food and vomit: Secondary | ICD-10-CM | POA: Diagnosis present

## 2013-04-23 DIAGNOSIS — E876 Hypokalemia: Secondary | ICD-10-CM | POA: Diagnosis present

## 2013-04-23 DIAGNOSIS — F10939 Alcohol use, unspecified with withdrawal, unspecified: Secondary | ICD-10-CM | POA: Diagnosis present

## 2013-04-23 DIAGNOSIS — F1020 Alcohol dependence, uncomplicated: Secondary | ICD-10-CM | POA: Diagnosis present

## 2013-04-23 DIAGNOSIS — J189 Pneumonia, unspecified organism: Secondary | ICD-10-CM | POA: Diagnosis present

## 2013-04-23 DIAGNOSIS — Z9119 Patient's noncompliance with other medical treatment and regimen: Secondary | ICD-10-CM

## 2013-04-23 DIAGNOSIS — Z91199 Patient's noncompliance with other medical treatment and regimen due to unspecified reason: Secondary | ICD-10-CM

## 2013-04-23 DIAGNOSIS — F10931 Alcohol use, unspecified with withdrawal delirium: Secondary | ICD-10-CM | POA: Diagnosis present

## 2013-04-23 DIAGNOSIS — E46 Unspecified protein-calorie malnutrition: Secondary | ICD-10-CM | POA: Diagnosis present

## 2013-04-23 DIAGNOSIS — E872 Acidosis, unspecified: Secondary | ICD-10-CM | POA: Diagnosis present

## 2013-04-23 DIAGNOSIS — R1310 Dysphagia, unspecified: Secondary | ICD-10-CM | POA: Diagnosis present

## 2013-04-23 DIAGNOSIS — E860 Dehydration: Secondary | ICD-10-CM | POA: Diagnosis present

## 2013-04-23 DIAGNOSIS — I498 Other specified cardiac arrhythmias: Secondary | ICD-10-CM | POA: Diagnosis present

## 2013-04-23 DIAGNOSIS — E781 Pure hyperglyceridemia: Secondary | ICD-10-CM | POA: Diagnosis present

## 2013-04-23 DIAGNOSIS — R569 Unspecified convulsions: Secondary | ICD-10-CM

## 2013-04-23 DIAGNOSIS — F10239 Alcohol dependence with withdrawal, unspecified: Secondary | ICD-10-CM | POA: Diagnosis present

## 2013-04-23 DIAGNOSIS — A419 Sepsis, unspecified organism: Principal | ICD-10-CM | POA: Diagnosis present

## 2013-04-23 DIAGNOSIS — F17200 Nicotine dependence, unspecified, uncomplicated: Secondary | ICD-10-CM | POA: Diagnosis present

## 2013-04-23 DIAGNOSIS — G40909 Epilepsy, unspecified, not intractable, without status epilepticus: Secondary | ICD-10-CM | POA: Diagnosis present

## 2013-04-23 DIAGNOSIS — R748 Abnormal levels of other serum enzymes: Secondary | ICD-10-CM | POA: Diagnosis present

## 2013-04-23 DIAGNOSIS — R0902 Hypoxemia: Secondary | ICD-10-CM | POA: Diagnosis present

## 2013-04-23 DIAGNOSIS — D72829 Elevated white blood cell count, unspecified: Secondary | ICD-10-CM | POA: Diagnosis present

## 2013-04-23 DIAGNOSIS — Z681 Body mass index (BMI) 19 or less, adult: Secondary | ICD-10-CM

## 2013-04-23 DIAGNOSIS — Z781 Physical restraint status: Secondary | ICD-10-CM | POA: Diagnosis present

## 2013-04-23 LAB — COMPREHENSIVE METABOLIC PANEL
ALBUMIN: 3.8 g/dL (ref 3.5–5.2)
ALT: 213 U/L — ABNORMAL HIGH (ref 0–53)
AST: 1094 U/L — AB (ref 0–37)
Alkaline Phosphatase: 135 U/L — ABNORMAL HIGH (ref 39–117)
BILIRUBIN TOTAL: 0.7 mg/dL (ref 0.3–1.2)
BUN: 7 mg/dL (ref 6–23)
CHLORIDE: 80 meq/L — AB (ref 96–112)
CO2: 23 meq/L (ref 19–32)
Calcium: 9.1 mg/dL (ref 8.4–10.5)
Creatinine, Ser: 0.82 mg/dL (ref 0.50–1.35)
GFR calc Af Amer: >90 mL/min (ref >90)
GFR calc non Af Amer: >90 mL/min (ref >90)
Glucose, Bld: 149 mg/dL — ABNORMAL HIGH (ref 70–99)
Potassium: 5.8 mEq/L — ABNORMAL HIGH (ref 3.7–5.3)
SODIUM: 127 meq/L — AB (ref 137–147)
Total Protein: 9 g/dL — ABNORMAL HIGH (ref 6.0–8.3)

## 2013-04-23 LAB — CBC WITH DIFFERENTIAL/PLATELET
BASOS ABS: 0 10*3/uL (ref 0.0–0.1)
Basophils Relative: 0 % (ref 0–1)
Eosinophils Absolute: 0 10*3/uL (ref 0.0–0.7)
Eosinophils Relative: 0 % (ref 0–5)
HCT: 39.4 % (ref 39.0–52.0)
Hemoglobin: 14.7 g/dL (ref 13.0–17.0)
Lymphocytes Relative: 5 % — ABNORMAL LOW (ref 12–46)
Lymphs Abs: 1.2 10*3/uL (ref 0.7–4.0)
MCH: 36.2 pg — ABNORMAL HIGH (ref 26.0–34.0)
MCHC: 35.5 g/dL (ref 30.0–36.0)
MCV: 97 fL (ref 78.0–100.0)
Monocytes Absolute: 0.5 10*3/uL (ref 0.1–1.0)
Monocytes Relative: 2 % — ABNORMAL LOW (ref 3–12)
NEUTROS ABS: 20.8 10*3/uL — AB (ref 1.7–7.7)
Neutrophils Relative %: 93 % — ABNORMAL HIGH (ref 43–77)
PLATELETS: 259 10*3/uL (ref 150–400)
RBC: 4.06 MIL/uL — ABNORMAL LOW (ref 4.22–5.81)
RDW: 14.1 % (ref 11.5–15.5)
WBC: 22.5 10*3/uL — ABNORMAL HIGH (ref 4.0–10.5)

## 2013-04-23 LAB — URINALYSIS, ROUTINE W REFLEX MICROSCOPIC
BILIRUBIN URINE: NEGATIVE
Glucose, UA: NEGATIVE mg/dL
KETONES UR: NEGATIVE mg/dL
Leukocytes, UA: NEGATIVE
NITRITE: NEGATIVE
PH: 7 (ref 5.0–8.0)
Protein, ur: 100 mg/dL — AB
SPECIFIC GRAVITY, URINE: 1.013 (ref 1.005–1.030)
Urobilinogen, UA: 0.2 mg/dL (ref 0.0–1.0)

## 2013-04-23 LAB — LACTIC ACID, PLASMA: LACTIC ACID, VENOUS: 7.3 mmol/L — AB (ref 0.5–2.2)

## 2013-04-23 LAB — CK: Total CK: 483 U/L — ABNORMAL HIGH (ref 7–232)

## 2013-04-23 LAB — URINE MICROSCOPIC-ADD ON

## 2013-04-23 LAB — RAPID URINE DRUG SCREEN, HOSP PERFORMED
Amphetamines: NOT DETECTED
BARBITURATES: NOT DETECTED
BENZODIAZEPINES: NOT DETECTED
COCAINE: NOT DETECTED
Opiates: NOT DETECTED
Tetrahydrocannabinol: NOT DETECTED

## 2013-04-23 LAB — PHENYTOIN LEVEL, TOTAL

## 2013-04-23 LAB — I-STAT CG4 LACTIC ACID, ED: Lactic Acid, Venous: 8.76 mmol/L — ABNORMAL HIGH (ref 0.5–2.2)

## 2013-04-23 LAB — ETHANOL: Alcohol, Ethyl (B): <11 mg/dL (ref 0–11)

## 2013-04-23 LAB — TROPONIN I

## 2013-04-23 LAB — POTASSIUM: Potassium: 3.7 mEq/L (ref 3.7–5.3)

## 2013-04-23 LAB — LIPASE, BLOOD: Lipase: 29 U/L (ref 11–59)

## 2013-04-23 MED ORDER — DEXTROSE 5 % IV SOLN: 500.0000 mg | Freq: Once | INTRAVENOUS | Status: DC

## 2013-04-23 MED ORDER — SODIUM CHLORIDE 0.9 % IV SOLN
Freq: Once | INTRAVENOUS | Status: AC
  Administered 2013-04-23: 950 mL/h via INTRAVENOUS

## 2013-04-23 MED ORDER — SODIUM CHLORIDE 0.9 % IV BOLUS (SEPSIS)
1000.0000 mL | Freq: Once | INTRAVENOUS | Status: AC
  Administered 2013-04-23: 1000 mL via INTRAVENOUS

## 2013-04-23 MED ORDER — DEXTROSE 5 % IV SOLN
1.0000 g | Freq: Once | INTRAVENOUS | Status: AC
  Administered 2013-04-23: 1 g via INTRAVENOUS
  Filled 2013-04-23: qty 10

## 2013-04-23 MED ORDER — LORAZEPAM 2 MG/ML IJ SOLN
1.0000 mg | Freq: Once | INTRAMUSCULAR | Status: AC
  Administered 2013-04-23: 1 mg via INTRAVENOUS
  Filled 2013-04-23: qty 1

## 2013-04-23 MED ORDER — SODIUM CHLORIDE 0.9 % IV SOLN
1000.0000 mg | Freq: Once | INTRAVENOUS | Status: AC
  Administered 2013-04-23: 1000 mg via INTRAVENOUS
  Filled 2013-04-23: qty 20

## 2013-04-23 NOTE — ED Notes (Signed)
Lab at bedside to draw cultures

## 2013-04-23 NOTE — ED Notes (Signed)
Pt. In xray 

## 2013-04-23 NOTE — ED Notes (Signed)
Last drink was Monday, 1/2 pint.

## 2013-04-23 NOTE — Assessment & Plan Note (Signed)
BP is high today likely due to alcohol withdrawal. Will re-evaluate at next visit and change regimen if no improvement.

## 2013-04-23 NOTE — ED Notes (Signed)
Pt. Extremely diaphoretic. C/o 8/10 generalized pain. Reports N/V for the past 2 weeks. Per family, pt has been diaphoretic with hot flashes x4 days. Pt. Denies chest pain.

## 2013-04-23 NOTE — Assessment & Plan Note (Signed)
Feel that his dehydrated, nausea, tachycardia and high blood pressure is related to alcohol withdrawal. He admits to drinking on Sunday or Monday. Unclear if it was his usual amount. Advised him that cessation of alcohol is the medically advised option. He admits to eating minimal amounts but maybe 1-2 small meals per day. He is having lack of appetite.

## 2013-04-23 NOTE — Patient Instructions (Signed)
We have given you some fluids and would like you to try to stay away from alcohol as this is likely causing some of your problems.   Alcohol can make you feel shaky and your heart race when it is coming out of your system. Drinking alcohol everyday can cause serious health problems and can make seizures more likely.   Keep taking your phenytoin and your blood pressure medicine.   Come back in 2-3 months to check on your blood pressure.  If you are continuing to have these problems or they worsen please call our clinic at [phone].  Alcohol Withdrawal  SEEK IMMEDIATE MEDICAL CARE IF:   You have a seizure.  You have a fever.  You experience uncontrolled vomiting or you vomit up blood. This may be bright red or look like black coffee grounds.  You have blood in the stool. This may be bright red or appear as a black, tarry, bad-smelling stool.  You become lightheaded or faint. Do not drive if you feel this way. Have someone else drive you or call 119 for help.  You become more agitated or confused.  You develop uncontrolled anxiety.  You begin to see things that are not really there (hallucinate).  Alcohol withdrawal happens when you normally drink alcohol a lot and suddenly stop drinking. Alcohol withdrawal symptoms can be mild to very bad. Mild withdrawal symptoms can include feeling sick to your stomach (nauseous), headache, or feeling irritable. Bad withdrawal symptoms can include shakiness, being very nervous (anxious), and not thinking clearly.  HOME CARE  Join an alcohol support group.  Stay away from people or situations that make you want to drink.  Eat a healthy diet. Eat a lot of fresh fruits, vegetables, and lean meats. GET HELP RIGHT AWAY IF:   You become confused. You start to see and hear things that are not really there.  You feel your heart beating very fast.  You throw up (vomit) blood or cannot stop throwing up. This may be bright red or look like black  coffee grounds.  You have blood in your poop (stool). This may be bright red, maroon colored, or black and tarry.  You are lightheaded or pass out (faint).  You develop a fever. MAKE SURE YOU:   Understand these instructions.  Will watch your condition.  Will get help right away if you are not doing well or get worse. Document Released: 07/20/2007 Document Revised: 04/25/2011 Document Reviewed: 07/20/2007 [facility] Patient Information 2014 [place], [place].

## 2013-04-23 NOTE — Progress Notes (Signed)
Subjective:     Patient ID: Joseph Underwood, male   DOB: August 10, 1965, 48 y.o.   MRN: [id number]  HPI The patient is a 48 YO man coming in for a follow up of an ER visit. He was seen in the emergency room for intoxication with alcohol with related chest pain. He had been off his dilantin and self reported 1 seizure. He is having some pain and some shaking today. He is feeling nauseous and is a little sweaty. He is not sure if his heart has been racing at home. His mom accompanies him today and he is reluctant to discuss how much alcohol he is drinking due to her presence. He does admit that he is still drinking almost every day. He has had two episodes of shaking and possible seizure since February. He was not taking his medicine then but started taking it again after the ER visit. He denies congestion, cough, SOB, chest pain. He is not having diarrhea or constipation. He is not having vomiting and has never seen blood when he vomits. He is nauseous. He denies fevers or chills at home. No recent falls or head injury at home.   Review of Systems  Constitutional: Positive for appetite change and fatigue. Negative for fever, chills and activity change.  Respiratory: Negative for cough, chest tightness, shortness of breath and wheezing.   Cardiovascular: Negative for chest pain and leg swelling.  Gastrointestinal: Positive for nausea. Negative for vomiting, abdominal pain, diarrhea and constipation.  Genitourinary: Negative for dysuria.  Musculoskeletal: Positive for myalgias.  Neurological: Positive for seizures. Negative for dizziness, tremors, syncope, facial asymmetry, speech difficulty, weakness, light-headedness, numbness and headaches.       Objective:   Physical Exam  Constitutional: He is oriented to person, place, and time. He appears well-developed.  thin  HENT:  Head: Normocephalic and atraumatic.  Neck: Normal range of motion. Neck supple. No JVD present. No tracheal deviation present. No  thyromegaly present.  Cardiovascular: Regular rhythm.   Fast rate  Pulmonary/Chest: Effort normal and breath sounds normal. No respiratory distress. He has no wheezes. He has no rales.  Abdominal: Soft. Bowel sounds are normal. He exhibits no distension. There is no tenderness. There is no rebound.  Lymphadenopathy:    He has no cervical adenopathy.  Neurological: He is alert and oriented to person, place, and time. No cranial nerve deficit.       Assessment/Plan:   1. Please see problem oriented charting.  2. Disposition - Patient will be given 1 L normal saline and re-evaluated. He will continue taking phenytoin and HCTZ. He is advised to avoid alcohol at all. He has signed up for [facility] [employer] but his coverage will not start until he makes his first payment (per his mother of $82).   Update: Upon recheck after 1L normal saline and nap the patient is looking improved and less shaky. He is still mildly tachycardic (110s) but down from 140s. He will be safe for discharge and was given instructions and note to be off work today.

## 2013-04-23 NOTE — ED Notes (Signed)
Per EMS, pt. C/o generalized body aches and "not feeling well" for the past 3 days. Today patient had a seizure. Hx of same. States seizure was unremarkable compared to others. States has seizures about every week. Pt. Having hot flashes and is tachycardic.

## 2013-04-23 NOTE — ED Provider Notes (Signed)
CSN: [id number]     Arrival date & time 04/23/13  2011 History   First MD Initiated Contact with Patient 04/23/13 2043     Chief Complaint  Patient presents with  . Generalized Body Aches  . Seizures  el  (Consider location/radiation/quality/duration/timing/severity/associated sxs/prior Treatment) HPI Comments: Patient is a 48 year old male with history of seizures and alcohol abuse who presents to the emergency department for increased seizure activity and "not feeling well". Patient states that he has not been feeling like himself for the past few days. Patient has also been noted to have approximately 5 seizures over the last week which is unusual for him. Patient's most recent seizure was 2 hours ago. These are lasted for approximately 3 minutes and consisted of "whole body shaking". Mother denies head trauma during activity and incontinence, though patient did experience tongue trauma. Post ictal phase lasted approximately 1 minute. Patient denies aura preceding seizures.   Patient endorses noncompliance with his Dilantin. He denies being followed by a neurologist and states his seizures and seizure medications managed by his primary care doctor. Patient also endorses a history of seizure activity secondary to alcohol withdrawal. He states he last drank 2 days ago at which time he ingested a half a pint of liquor. Patient denies associated fever, chest pain, shortness of breath, cough, nausea or vomiting, abdominal pain, diarrhea, melena or hematochezia, urinary symptoms, and numbness/tingling.  Patient is a 48 y.o. male presenting with seizures. The history is provided by the patient. No language interpreter was used.  Seizures   Past Medical History  Diagnosis Date  . Seizures   . Alcohol abuse   . Hiccups   . HTN (hypertension)    Past Surgical History  Procedure Laterality Date  . No past surgeries     No family history on file. History  Substance Use Topics  . Smoking  status: Current Every Day Smoker -- 0.50 packs/day for 25 years    Types: Cigarettes  . Smokeless tobacco: Never Used  . Alcohol Use: Yes     Comment: a fifth every other day or 4-5 beers per day    Review of Systems  Constitutional: Positive for fatigue.  Respiratory: Negative for cough and shortness of breath.   Cardiovascular: Negative for chest pain.  Gastrointestinal: Negative for nausea, vomiting, abdominal pain and diarrhea.  Genitourinary: Negative for dysuria and hematuria.       No incontinence  Neurological: Positive for seizures. Negative for numbness.  Psychiatric/Behavioral: Negative for hallucinations.  All other systems reviewed and are negative.      Allergies  Review of patient's allergies indicates no known allergies.  Home Medications   Current Outpatient Rx  Name  Route  Sig  Dispense  Refill  . hydrochlorothiazide (HYDRODIURIL) 25 MG tablet   Oral   Take 1 tablet (25 mg total) by mouth daily.   30 tablet   0   . phenytoin (DILANTIN) 100 MG ER capsule   Oral   Take 1 capsule (100 mg total) by mouth 3 (three) times daily.   90 capsule   0    BP 164/114  Pulse 125  Temp(Src) 100 F (37.8 C) (Rectal)  Resp 18  SpO2 97%  Physical Exam  Nursing note and vitals reviewed. Constitutional: He is oriented to person, place, and time. He appears well-developed and well-nourished. No distress.  Patient ill appearing and mildly diaphoretic.  HENT:  Head: Normocephalic and atraumatic.  Evidence of trauma to R side of tongue  without active bleeding.  Eyes: Conjunctivae and EOM are normal. No scleral icterus.  Neck: Normal range of motion. Neck supple.  Cardiovascular: Regular rhythm and normal heart sounds.   Tachy to 126.  Pulmonary/Chest: Effort normal. No respiratory distress. He has no wheezes. He has no rales.  Abdominal: Soft. He exhibits no distension. There is no tenderness. There is no rebound and no guarding.  Musculoskeletal: Normal range  of motion.  Neurological: He is alert and oriented to person, place, and time. He has normal reflexes. No cranial nerve deficit. He exhibits normal muscle tone. Coordination normal.  GCS 15. Speech is goal oriented. Patient answers questions appropriately. No cranial nerve deficits appreciated; symmetric eyebrow raise, no facial drooping. Patient moves extremities without ataxia. He has normal and equal grip strength bilaterally with normal strength against resistance. Normal and symmetric DTRs.  Skin: Skin is warm and dry. No rash noted. No erythema. No pallor.  Psychiatric: He has a normal mood and affect. His behavior is normal.    ED Course  Procedures (including critical care time) Labs Review Labs Reviewed  LACTIC ACID, PLASMA - Abnormal; Notable for the following:    Lactic Acid, Venous 7.3 (*)    All other components within normal limits  CBC WITH DIFFERENTIAL - Abnormal; Notable for the following:    WBC 22.5 (*)    RBC 4.06 (*)    MCH 36.2 (*)    Neutrophils Relative % 93 (*)    Neutro Abs 20.8 (*)    Lymphocytes Relative 5 (*)    Monocytes Relative 2 (*)    All other components within normal limits  COMPREHENSIVE METABOLIC PANEL - Abnormal; Notable for the following:    Sodium 127 (*)    Potassium 5.8 (*)    Chloride 80 (*)    Glucose, Bld 149 (*)    Total Protein 9.0 (*)    AST 1094 (*)    ALT 213 (*)    Alkaline Phosphatase 135 (*)    All other components within normal limits  CK - Abnormal; Notable for the following:    Total CK 483 (*)    All other components within normal limits  PHENYTOIN LEVEL, TOTAL - Abnormal; Notable for the following:    Phenytoin Lvl <2.5 (*)    All other components within normal limits  URINALYSIS, ROUTINE W REFLEX MICROSCOPIC - Abnormal; Notable for the following:    APPearance CLOUDY (*)    Hgb urine dipstick MODERATE (*)    Protein, ur 100 (*)    All other components within normal limits  URINE MICROSCOPIC-ADD ON - Abnormal;  Notable for the following:    Casts HYALINE CASTS (*)    All other components within normal limits  I-STAT CG4 LACTIC ACID, ED - Abnormal; Notable for the following:    Lactic Acid, Venous 8.76 (*)    All other components within normal limits  CULTURE, BLOOD (ROUTINE X 2)  CULTURE, BLOOD (ROUTINE X 2)  LIPASE, BLOOD  URINE RAPID DRUG SCREEN (HOSP PERFORMED)  ETHANOL  POTASSIUM  TROPONIN I   Imaging Review Dg Chest 2 View  04/23/2013   CLINICAL DATA Body aches.  EXAM CHEST  2 VIEW  COMPARISON March 30, 2013.  FINDINGS The heart size and mediastinal contours are within normal limits. Left lung is clear. Alveolar opacity is noted in right lower lobe consistent with pneumonia. No pneumothorax or pleural effusion is noted. The visualized skeletal structures are unremarkable.  IMPRESSION Right lower lobe pneumonia.  SIGNATURE  Electronically Signed   By: Sabino Dick M.D.   On: 04/23/2013 22:33    Date: 04/23/2013  Rate: 123  Rhythm: sinus tachycardia and premature ventricular contractions (PVC)  QRS Axis: normal  Intervals: normal  ST/T Wave abnormalities: nonspecific T wave changes  Conduction Disutrbances:none  Narrative Interpretation: Sinus tachycardia with PVC x 1 and LVH; no STEMI  Old EKG Reviewed: no significant change since last tracing I have personally reviewed and interpreted this EKG   MDM   Final diagnoses:  Pneumonia  Sepsis    48 year old male presents to the emergency department for increased seizure activity and "not feeling right". Patient states that symptoms have been ongoing over the past week. He endorses history of seizures secondary to alcohol withdrawal as well as recent noncompliance with his daily Dilantin. Patient's most recent seizure was at 7:30 PM this evening, prior to arrival. Patient had seizure-like activity for approximately 3 minutes with a 1 minute postictal phase. No incontinence, though relative does endorse tongue biting. On initial  presentation, patient is ill but nontoxic appearing. He is mildly diaphoretic, but in no acute distress. Physical exam significant for mild trauma to right side of patient's tongue. He has no focal neurologic deficits on exam today.  Workup today significant for a leukocytosis of 22.5 as well as elevated lactate. This may be partially elevated from seizure patient states he experience PTA. With tachycardia and evidence of RLL pneumonia on CXR, patient meets criteria for sepsis. Cannot exclude aspiration pneumonia given hx of ETOH abuse. Have ordered blood cultures, rocephin, and azithromycin. Patient also with subtherapeutic dilantin level. Will load with 1g Dilantin today. My suspicion for ETOH withdrawal is lower as patient has normal ethanol level and states he last drank yesterday afternoon.  Have consulted with IM teaching service who will admit patient to step down for further monitoring, evaluation, and management of RLL pneumonia. Temp admit orders placed.    Antonietta Breach, PA-C 04/23/13 [place], PA-C 04/24/13 0000

## 2013-04-23 NOTE — Assessment & Plan Note (Signed)
Patient admits to several shakes and "seizures" since last visit. Will continue phenytoin (which he had been off) and advised him to not interrupt treatment. Looking back, he has only ever had one therapeutic level of phenytoin throughout his treatment and no seizures up until now. Likely that interruptions in his alcohol due to cost or nausea may be causing these problems.

## 2013-04-23 NOTE — ED Notes (Signed)
Pt called out, this RN went into room. IV had "fallen out." Primary RN notified.

## 2013-04-24 ENCOUNTER — Encounter (HOSPITAL_COMMUNITY): Payer: Self-pay | Admitting: Internal Medicine

## 2013-04-24 DIAGNOSIS — E8729 Other acidosis: Secondary | ICD-10-CM | POA: Diagnosis present

## 2013-04-24 DIAGNOSIS — R569 Unspecified convulsions: Secondary | ICD-10-CM

## 2013-04-24 DIAGNOSIS — E872 Acidosis, unspecified: Secondary | ICD-10-CM

## 2013-04-24 DIAGNOSIS — J690 Pneumonitis due to inhalation of food and vomit: Secondary | ICD-10-CM | POA: Diagnosis present

## 2013-04-24 DIAGNOSIS — R748 Abnormal levels of other serum enzymes: Secondary | ICD-10-CM | POA: Diagnosis present

## 2013-04-24 DIAGNOSIS — F1020 Alcohol dependence, uncomplicated: Secondary | ICD-10-CM

## 2013-04-24 DIAGNOSIS — F17200 Nicotine dependence, unspecified, uncomplicated: Secondary | ICD-10-CM

## 2013-04-24 DIAGNOSIS — A419 Sepsis, unspecified organism: Principal | ICD-10-CM

## 2013-04-24 DIAGNOSIS — I10 Essential (primary) hypertension: Secondary | ICD-10-CM

## 2013-04-24 DIAGNOSIS — E781 Pure hyperglyceridemia: Secondary | ICD-10-CM | POA: Diagnosis present

## 2013-04-24 DIAGNOSIS — Z720 Tobacco use: Secondary | ICD-10-CM | POA: Diagnosis present

## 2013-04-24 LAB — LEGIONELLA ANTIGEN, URINE: Legionella Antigen, Urine: NEGATIVE

## 2013-04-24 LAB — LIPID PANEL
CHOLESTEROL: 395 mg/dL — AB (ref 0–200)
LDL Cholesterol: UNDETERMINED mg/dL (ref 0–99)
Triglycerides: 1444 mg/dL — ABNORMAL HIGH (ref <150)
VLDL: UNDETERMINED mg/dL (ref 0–40)

## 2013-04-24 LAB — CBC
HCT: 32.7 % — ABNORMAL LOW (ref 39.0–52.0)
HEMOGLOBIN: 11.9 g/dL — AB (ref 13.0–17.0)
MCH: 35 pg — ABNORMAL HIGH (ref 26.0–34.0)
MCHC: 36.4 g/dL — ABNORMAL HIGH (ref 30.0–36.0)
MCV: 96.2 fL (ref 78.0–100.0)
Platelets: 197 10*3/uL (ref 150–400)
RBC: 3.4 MIL/uL — ABNORMAL LOW (ref 4.22–5.81)
RDW: 13.8 % (ref 11.5–15.5)
WBC: 18 10*3/uL — ABNORMAL HIGH (ref 4.0–10.5)

## 2013-04-24 LAB — BASIC METABOLIC PANEL
BUN: 6 mg/dL (ref 6–23)
BUN: 5 mg/dL — ABNORMAL LOW (ref 6–23)
BUN: 5 mg/dL — ABNORMAL LOW (ref 6–23)
CALCIUM: 7.6 mg/dL — AB (ref 8.4–10.5)
CO2: 21 meq/L (ref 19–32)
CO2: 27 mEq/L (ref 19–32)
CO2: 28 mEq/L (ref 19–32)
CREATININE: 0.6 mg/dL (ref 0.50–1.35)
Calcium: 8.4 mg/dL (ref 8.4–10.5)
Calcium: 8.4 mg/dL (ref 8.4–10.5)
Chloride: 90 mEq/L — ABNORMAL LOW (ref 96–112)
Chloride: 92 mEq/L — ABNORMAL LOW (ref 96–112)
Chloride: 92 mEq/L — ABNORMAL LOW (ref 96–112)
Creatinine, Ser: 0.67 mg/dL (ref 0.50–1.35)
Creatinine, Ser: 0.71 mg/dL (ref 0.50–1.35)
GFR calc Af Amer: >90 mL/min (ref >90)
GFR calc Af Amer: >90 mL/min (ref >90)
GFR calc Af Amer: >90 mL/min (ref >90)
GFR calc non Af Amer: >90 mL/min (ref >90)
GFR calc non Af Amer: >90 mL/min (ref >90)
GFR calc non Af Amer: >90 mL/min (ref >90)
Glucose, Bld: 108 mg/dL — ABNORMAL HIGH (ref 70–99)
Glucose, Bld: 94 mg/dL (ref 70–99)
Glucose, Bld: 83 mg/dL (ref 70–99)
Potassium: 3.6 mEq/L — ABNORMAL LOW (ref 3.7–5.3)
Potassium: 2.9 mEq/L — CL (ref 3.7–5.3)
Potassium: 2.9 mEq/L — CL (ref 3.7–5.3)
Sodium: 135 mEq/L — ABNORMAL LOW (ref 137–147)
Sodium: 136 mEq/L — ABNORMAL LOW (ref 137–147)
Sodium: 138 mEq/L (ref 137–147)

## 2013-04-24 LAB — STREP PNEUMONIAE URINARY ANTIGEN: STREP PNEUMO URINARY ANTIGEN: NEGATIVE

## 2013-04-24 LAB — HIV ANTIBODY (ROUTINE TESTING W REFLEX): HIV: NONREACTIVE

## 2013-04-24 LAB — MAGNESIUM
Magnesium: 0.9 mg/dL — CL (ref 1.5–2.5)
Magnesium: 2 mg/dL (ref 1.5–2.5)

## 2013-04-24 LAB — SALICYLATE LEVEL: Salicylate Lvl: <2 mg/dL — ABNORMAL LOW (ref 2.8–20.0)

## 2013-04-24 LAB — KETONES, QUALITATIVE

## 2013-04-24 LAB — MRSA PCR SCREENING: MRSA by PCR: NEGATIVE

## 2013-04-24 LAB — OSMOLALITY: Osmolality: 277 mOsm/kg (ref 275–300)

## 2013-04-24 LAB — LACTIC ACID, PLASMA: LACTIC ACID, VENOUS: 1.2 mmol/L (ref 0.5–2.2)

## 2013-04-24 MED ORDER — VANCOMYCIN HCL IN DEXTROSE 1-5 GM/200ML-% IV SOLN
1000.0000 mg | Freq: Once | INTRAVENOUS | Status: AC
Start: 2013-04-24 — End: 2013-04-24
  Administered 2013-04-24: 1000 mg via INTRAVENOUS
  Filled 2013-04-24: qty 200

## 2013-04-24 MED ORDER — MAGNESIUM SULFATE 40 MG/ML IJ SOLN: 2.0000 g | Freq: Once | INTRAMUSCULAR | Status: DC

## 2013-04-24 MED ORDER — PNEUMOCOCCAL VAC POLYVALENT 25 MCG/0.5ML IJ INJ
0.5000 mL | INJECTION | INTRAMUSCULAR | Status: DC
  Filled 2013-04-24: qty 0.5

## 2013-04-24 MED ORDER — LORAZEPAM 2 MG/ML IJ SOLN
1.0000 mg | Freq: Four times a day (QID) | INTRAMUSCULAR | Status: DC | PRN
  Administered 2013-04-24 – 2013-04-25 (×4): 1 mg via INTRAVENOUS
  Filled 2013-04-24 (×5): qty 1

## 2013-04-24 MED ORDER — POTASSIUM CHLORIDE 10 MEQ/100ML IV SOLN
10.0000 meq | INTRAVENOUS | Status: AC
  Administered 2013-04-24 (×4): 10 meq via INTRAVENOUS
  Filled 2013-04-24 (×2): qty 100

## 2013-04-24 MED ORDER — THIAMINE HCL 100 MG/ML IJ SOLN
100.0000 mg | Freq: Once | INTRAMUSCULAR | Status: AC
  Administered 2013-04-24: 100 mg via INTRAVENOUS
  Filled 2013-04-24: qty 1

## 2013-04-24 MED ORDER — VANCOMYCIN HCL IN DEXTROSE 1-5 GM/200ML-% IV SOLN
1000.0000 mg | Freq: Two times a day (BID) | INTRAVENOUS | Status: DC
  Administered 2013-04-24 – 2013-04-25 (×3): 1000 mg via INTRAVENOUS
  Filled 2013-04-24 (×4): qty 200

## 2013-04-24 MED ORDER — PHENYTOIN SODIUM 50 MG/ML IJ SOLN
100.0000 mg | Freq: Three times a day (TID) | INTRAMUSCULAR | Status: DC
  Administered 2013-04-24 – 2013-04-27 (×10): 100 mg via INTRAVENOUS
  Filled 2013-04-24 (×14): qty 2

## 2013-04-24 MED ORDER — PIPERACILLIN-TAZOBACTAM 3.375 G IVPB
3.3750 g | Freq: Three times a day (TID) | INTRAVENOUS | Status: DC
  Administered 2013-04-24 – 2013-04-26 (×8): 3.375 g via INTRAVENOUS
  Filled 2013-04-24 (×12): qty 50

## 2013-04-24 MED ORDER — ADULT MULTIVITAMIN W/MINERALS CH
1.0000 | ORAL_TABLET | Freq: Every day | ORAL | Status: DC
  Administered 2013-04-24 – 2013-04-26 (×3): 1 via ORAL
  Filled 2013-04-24 (×4): qty 1

## 2013-04-24 MED ORDER — ENOXAPARIN SODIUM 40 MG/0.4ML ~~LOC~~ SOLN
40.0000 mg | SUBCUTANEOUS | Status: DC
  Administered 2013-04-24 – 2013-04-26 (×3): 40 mg via SUBCUTANEOUS
  Filled 2013-04-24 (×3): qty 0.4

## 2013-04-24 MED ORDER — FOLIC ACID 1 MG PO TABS
1.0000 mg | ORAL_TABLET | Freq: Every day | ORAL | Status: DC
  Administered 2013-04-24 – 2013-04-26 (×3): 1 mg via ORAL
  Filled 2013-04-24 (×4): qty 1

## 2013-04-24 MED ORDER — DEXTROSE-NACL 5-0.9 % IV SOLN
INTRAVENOUS | Status: DC
  Administered 2013-04-24 – 2013-04-25 (×3): via INTRAVENOUS

## 2013-04-24 MED ORDER — LORAZEPAM 2 MG/ML IJ SOLN
2.0000 mg | INTRAMUSCULAR | Status: DC | PRN
  Administered 2013-04-24 – 2013-04-25 (×6): 2 mg via INTRAVENOUS
  Filled 2013-04-24 (×6): qty 1

## 2013-04-24 MED ORDER — LORAZEPAM 1 MG PO TABS: 1.0000 mg | ORAL_TABLET | Freq: Four times a day (QID) | ORAL | Status: DC | PRN

## 2013-04-24 MED ORDER — VITAMIN B-1 100 MG PO TABS
100.0000 mg | ORAL_TABLET | Freq: Every day | ORAL | Status: DC
  Administered 2013-04-25 – 2013-04-26 (×2): 100 mg via ORAL
  Filled 2013-04-24 (×3): qty 1

## 2013-04-24 MED ORDER — MAGNESIUM SULFATE 4000MG/100ML IJ SOLN
4.0000 g | Freq: Once | INTRAMUSCULAR | Status: AC
  Administered 2013-04-24: 4 g via INTRAVENOUS
  Filled 2013-04-24 (×2): qty 100

## 2013-04-24 MED ORDER — POTASSIUM CHLORIDE 10 MEQ/100ML IV SOLN
10.0000 meq | INTRAVENOUS | Status: AC
  Administered 2013-04-24 – 2013-04-25 (×4): 10 meq via INTRAVENOUS
  Filled 2013-04-24 (×4): qty 100

## 2013-04-24 MED ORDER — THIAMINE HCL 100 MG/ML IJ SOLN
100.0000 mg | Freq: Every day | INTRAMUSCULAR | Status: DC
  Filled 2013-04-24 (×2): qty 1

## 2013-04-24 NOTE — ED Provider Notes (Signed)
Medical screening examination/treatment/procedure(s) were performed by non-physician practitioner and as supervising physician I was immediately available for consultation/collaboration.   EKG Interpretation None        Tannah Dreyfuss B. Chelesea Weiand, MD 04/24/13 0000 

## 2013-04-24 NOTE — Progress Notes (Signed)
CRITICAL VALUE ALERT  Critical value received:  Mg 0.9  Date of notification:  04/24/13  Time of notification:  0622  Critical value read back:yes  Nurse who received alert:  D. Aleene Davidson, RN  MD notified (1st page):  Dr. Redmond Pulling  Time of first page:  0623  MD notified (2nd page):Dr. Redmond Pulling  Time of second page: 0630  Responding MD:  Dr. Redmond Pulling  Time MD responded:  [phone]  New orders received.

## 2013-04-24 NOTE — H&P (Signed)
INTERNAL MEDICINE TEACHING SERVICE Attending Admission Note  Date: 04/24/2013  Patient name: Joseph Underwood  Medical record number: [id number]  Date of birth: 11/09/1965    I have seen and evaluated Joseph Underwood and discussed their care with the Residency Team.  47 yr old man with a pmhx significant for alcohol abuse, HTN, seizure disorder, presented to the ED after a possible seizure at home. He is a poor historian. Review of records indicates he was seen in clinic on day of admission for a follow up of an ER visit. At the time, he reported further seizures and had not been taking dilantin. He admitted to nausea and diaphoresis. He denied SOB, cough, or chest pain. He endorsed nausea but no vomiting. In clinic, he was given 1 L NS and was advised to avoid EtOH use. He was noted to have an improvement in HR from 140's to 110's after IVF. Mention of elevated BP due to alcohol withdrawal is noted. In the ED, he was noted to have a BP of 164/114 mmHg with a HR of 125 bpm. He was noted to have a temp of 100.0 F. O2 sat 97%. He was described at diaphoretic and ill appearing. He had evidence of trauma to R side of tongue without bleeding.  Lab studies significant for LA of 8.7, WBC 22.5k, K 5.8, AST 1094, ALT 135, CPK 483. A CXR indicated RLL PNA. BC ordered in the ED and he was started on Rocephin, azithro. He was given 1 g Dilantin as a loading dose. He was also given 3 L NS as boluses. He was also given 1 mg Ativan. He was admitted and diagnosed with sepsis secondary to RLL PNA. Concern for aspiration. He was place don Vanc and Zosyn. In addition, due to finding of AG met acidosis.   This morning, he was noted to have a Mg of 1.9 in addition to a Trig level of 1444. On exam, he is lethargic. He responds to sternal rub and answers some questions, but then falls asleep. He did receive ativan this morning due to agitation and high CIWA. Lung exam shows right basilar rhonchi, exp wheeze but diffusely  decreased breath sounds.  At this time, I agree this may have been a seizure with subsequent aspiration. Continue NPO given his likely post-ictal state. Continue phenytoin IV. Continue Vanc and Zosyn for now. I agree with IV D5NS but add 20 meq KCl per liter of fluid at 100 cc/hr. He does not have any clinical evidence of pancreatitis. His Trig are very much elevated and this is likely due to longstanding EtOH use. He needs to abstain from EtOH and given his NPO status, this will slowly decrease. I don't think we need to treat this acutely at this time.  He has evidence of an acute hepatitis that is likely alcohol related, but I suspect partial AST elevation due to seizure (AST is in muscle as well). Continue IVF.  Watch his respiratory status given degree of sedation this morning. I would be careful with dosing Ativan too aggressively in this patient, as he responded well to a small dose IV.   , DO, FACP [facility] 04/24/2013, 2:04 PM      

## 2013-04-24 NOTE — Progress Notes (Signed)
SLP Cancellation Note  Patient Details Name: Marguerite E Martinique MRN: [id number] DOB: [birth year]   Cancelled treatment:       Reason Eval/Treat Not Completed: Medical issues which prohibited therapy.  Per RN, patient getting ready to receive meds which will likely make patient lethargic. Will f/u once more fully and consistently alert.   [place], CCC-SLP [phone]    Jaylinn Hellenbrand Meryl 04/24/2013, 9:02 AM

## 2013-04-24 NOTE — Progress Notes (Signed)
Case discussed with Dr. Doug Sou at the time of the visit.  We reviewed the resident's history and exam and pertinent patient test results.  I agree with the assessment, diagnosis and plan of care documented in the resident's note.  He is not ready to quit alcohol at this time, but when he is ready he will likely require inpatient admission for detox.  We will discuss this option at each follow-up visit.

## 2013-04-24 NOTE — Progress Notes (Signed)
Patient transferred from ER via stretcher by ER RN. Oriented patient to unit and room, instructed on callbell and placed at side. No family available at this time.

## 2013-04-24 NOTE — Progress Notes (Signed)
ANTIBIOTIC CONSULT NOTE - INITIAL  Pharmacy Consult for Vancomycin and Zosyn  Indication: pneumonia  No Known Allergies  Patient Measurements: Height: 5' 4.96" (165 cm) Weight: 116 lb 13.5 oz (53 kg) IBW/kg (Calculated) : 61.41  Vital Signs: Temp: 99.9 F (37.7 C) (03/10 2345) Temp src: Rectal (03/10 2059) BP: 170/133 mmHg (03/10 2345) Pulse Rate: 116 (03/10 2345) Intake/Output from previous day:   Intake/Output from this shift:    Labs:  Recent Labs  04/23/13 2043  WBC 22.5*  HGB 14.7  PLT 259  CREATININE 0.82   Estimated Creatinine Clearance: 83.5 ml/min (by C-G formula based on Cr of 0.82). No results found for this basename: VANCOTROUGH, VANCOPEAK, VANCORANDOM, GENTTROUGH, GENTPEAK, GENTRANDOM, TOBRATROUGH, TOBRAPEAK, TOBRARND, AMIKACINPEAK, AMIKACINTROU, AMIKACIN,  in the last 72 hours   Microbiology: No results found for this or any previous visit (from the past 720 hour(s)).  Medical History: Past Medical History  Diagnosis Date  . Seizures   . Alcohol abuse   . Hiccups   . HTN (hypertension)     Medications:  HCTZ  Dilantin  Assessment: 48 yo male with possible aspiration PNA for empiric antibiotics  Goal of Therapy:  Vancomycin trough level 15-20 mcg/ml  Plan:  Vancomycin 1 g IV q12h Zosyn 3.375 g IV q8h   Caryl Pina 04/24/2013,12:21 AM

## 2013-04-24 NOTE — H&P (Signed)
Date: 04/24/2013               Patient Name:  Joseph Underwood MRN: [id number]  DOB: [birth year] Age / Sex: 48 y.o., male   PCP: Olga Millers, MD         Medical Service: Internal Medicine Teaching Service         Attending Physician: Dr. Dominic Pea, DO    First Contact: Dr. Juluis Mire Pager: [phone]  Second Contact: Dr. Adele Barthel Pager: [phone]       After Hours (After 5p/  First Contact Pager: [phone]  weekends / holidays): Second Contact Pager: [phone]   Chief Complaint: seizure  History of Present Illness: [name] is a 48 year old male with a PMH of EtOH abuse and w/d seizures who presents after apparent seizure at home.  History is provided by the patient who is a questionable historian.  He was at home in his usual state of health when he began to sweat, experienced blurry vision, racing heart and tremors.  He says his "dilantin was low."  He denies CP/SOB, cough or cold symptoms, tongue biting or loss of bowel or bladder control.  He also notes decreased po and non-bloody emesis yesterday.  His last drink was a half of a pint of liquor on the day prior to admission.  He says he drinks this amount of liquor about 5 days out of the week.  He says he has had w/d seizures in the past but does not think he had a seizure today.  He denies hallucinations.  In the ED:  T 97.4F, RR 17-21, SpO2 96% on RA, HR 122-140, BP 130-180/90-117; he received 3L NSS boluses, 1g Ceftriaxone, 1g Dilantin and 1mg  of Ativan  Meds: Current Facility-Administered Medications  Medication Dose Route Frequency Provider Last Rate Last Dose  . azithromycin (ZITHROMAX) 500 mg in dextrose 5 % 250 mL IVPB  500 mg Intravenous Once [employer], PA-C      . cefTRIAXone (ROCEPHIN) 1 g in dextrose 5 % 50 mL IVPB  1 g Intravenous Once Antonietta Breach, PA-C 100 mL/hr at 04/23/13 2345 1 g at 04/23/13 2345  . phenytoin (DILANTIN) 1,000 mg in sodium chloride 0.9 % 250 mL IVPB  1,000 mg Intravenous Once  Antonietta Breach, PA-C 405 mL/hr at 04/23/13 2358 1,000 mg at 04/23/13 2358   Current Outpatient Prescriptions  Medication Sig Dispense Refill  . hydrochlorothiazide (HYDRODIURIL) 25 MG tablet Take 1 tablet (25 mg total) by mouth daily.  30 tablet  0  . phenytoin (DILANTIN) 100 MG ER capsule Take 1 capsule (100 mg total) by mouth 3 (three) times daily.  90 capsule  0    Allergies: Allergies as of 04/23/2013  . (No Known Allergies)   Past Medical History  Diagnosis Date  . Seizures   . Alcohol abuse   . Hiccups   . HTN (hypertension)    Past Surgical History  Procedure Laterality Date  . No past surgeries     No family history on file. History   Social History  . Marital Status: Single    Spouse Name: N/A    Number of Children: N/A  . Years of Education: N/A   Occupational History  . Not on file.   Social History Main Topics  . Smoking status: Current Every Day Smoker -- 0.50 packs/day for 25 years    Types: Cigarettes  . Smokeless tobacco: Never Used  . Alcohol Use: Yes  Comment: a fifth every other day or 4-5 beers per day  . Drug Use: No  . Sexual Activity: Not on file   Other Topics Concern  . Not on file   Social History Narrative  . No narrative on file    Review of Systems: Pertinent items are noted in HPI. HEENT:  Denies fevers Cardiopulmonary:  Denies chest pain, dyspnea or cough GI:  + decreased po, + N/V, denies diarrhea GU:  Denies dysuria  Physical Exam: Blood pressure 170/133, pulse 116, temperature 99.9 F (37.7 C), temperature source Rectal, resp. rate 18, SpO2 94.00%. General: resting in bed in NAD, drowsy but arousable HEENT: PERRL, EOMI, oropharynx clear Cardiac: RRR, no rubs, murmurs or gallops Pulm: clear to auscultation bilaterally, moving normal volumes of air Abd: soft, nontender, nondistended, BS present Ext: warm and well perfused, no pedal edema, 2+ DPs Neuro: alert and oriented X3, cranial nerves II-XII grossly intact,  non-tremulous  Lab results: Basic Metabolic Panel:  Recent Labs  04/23/13 2043 04/23/13 2133  NA 127*  --   K 5.8* 3.7  CL 80*  --   CO2 23  --   GLUCOSE 149*  --   BUN 7  --   CREATININE 0.82  --   CALCIUM 9.1  --    Liver Function Tests:  Recent Labs  04/23/13 2043  AST 1094*  ALT 213*  ALKPHOS 135*  BILITOT 0.7  PROT 9.0*  ALBUMIN 3.8    Recent Labs  04/23/13 2043  LIPASE 29   CBC:  Recent Labs  04/23/13 2043  WBC 22.5*  NEUTROABS 20.8*  HGB 14.7  HCT 39.4  MCV 97.0  PLT 259   Cardiac Enzymes:  Recent Labs  04/23/13 2054 04/23/13 2133  CKTOTAL 483*  --   TROPONINI  --  <0.30   Urine Drug Screen: Drugs of Abuse     Component Value Date/Time   LABOPIA NONE DETECTED 04/23/2013 2127   COCAINSCRNUR NONE DETECTED 04/23/2013 2127   LABBENZ NONE DETECTED 04/23/2013 2127   AMPHETMU NONE DETECTED 04/23/2013 2127   THCU NONE DETECTED 04/23/2013 2127   LABBARB NONE DETECTED 04/23/2013 2127    Alcohol Level:  Recent Labs  04/23/13 2043  ETH <11   Urinalysis:  Recent Labs  04/23/13 2127  COLORURINE YELLOW  LABSPEC 1.013  PHURINE 7.0  GLUCOSEU NEGATIVE  HGBUR MODERATE*  BILIRUBINUR NEGATIVE  KETONESUR NEGATIVE  PROTEINUR 100*  UROBILINOGEN 0.2  NITRITE NEGATIVE  LEUKOCYTESUR NEGATIVE   Imaging results:  Dg Chest 2 View  04/23/2013   CLINICAL DATA Body aches.  EXAM CHEST  2 VIEW  COMPARISON March 30, 2013.  FINDINGS The heart size and mediastinal contours are within normal limits. Left lung is clear. Alveolar opacity is noted in right lower lobe consistent with pneumonia. No pneumothorax or pleural effusion is noted. The visualized skeletal structures are unremarkable.  IMPRESSION Right lower lobe pneumonia.  SIGNATURE  Electronically Signed   By: Sabino Dick M.D.   On: 04/23/2013 22:33   Other results: EKG:  Sinus tachycardia, 123 bpm, LVH, no significant change since last tracing.  Assessment & Plan by Problem:  Sepsis 2/2 to  pneumonia:  Patient reports dysphagia and feeling as if food may not have not gone down properly two days ago.  He told the ED he had body aches and had not been feeling well.  He denies cough, URI symptoms, chest pain or dyspnea, however patient with leukocytosis and RLL infiltrate in the setting of  possible seizure concerning for aspiration pneumonia.  Tmax 100F.  Sinus tachycardia but no chest pain, lower extremity pain or swelling, immobility to suggest PE. - admit to step down - change Rocephin and Azithromycin --> Vancomycin and Zosyn to cover Klebsiella (EtOH abuse) and give gram positive coverage - Received 3L NS bolus in ED, now on D5-NS maintenance (after thiamine) - Labs:  HIV, urine legionella and strep pneumo, sputum cx - Aspiration precautions - NPO for now - blood culture's pending  Increased AG metabolic acidosis:  Likely combination of alcoholic ketoacidosis, starvation ketoacidosis and lactic acidosis.  Lactic acid elevated, 8.76 in setting of sepsis. - D5-NS - treating for pneumonia as above - trend lactic acid and bmet  ?seizure 2/2 to EtOH withdrawal:  Unclear if he had a seizure today but reports multiple episodes lately.  He says he last drink was on the night prior to admission but EtOH level undetectable.  Also, non-compliant with Dilantin.  Dilantin loaded in ED.  CK level elevated on admission to 483 with U/A moderate Hgb and 3-6 RBC.  - continue Dilantin 100mg  TID, IV for now but may need to adjust dose - CIWA protocol - thiamine, folic acid, multivitamin - D5-NS - will need to stress adherence - consider social work consult for alcohol dependence  HTN--on HCTZ 25mg  at home.  -Holding po meds for now and in setting of sepsis.  Hypertriglyceridemia--Tg 1444 on admission with Cholesterol 395.  All lab results noted to be hemolyzed in setting of Lipemic specimen.  Need to work on CV risk reduction and prevention of possible pancreatitis (especially given alcohol  dependence).  Statin therapy will likely be appropriate, however currently CK elevated.   Tobacco dependence--smokes ~1/2ppd along with alcohol. Cessation strongly recommended.   Diet:  Patient reports dysphagia at times and concern for aspiration.  NPO,until SLP evaluation  VTE ppx:  [place] heparin  Dispo: Disposition is deferred at this time, awaiting improvement of current medical problems. Anticipated discharge in approximately 1-2 day(s).   The patient does have a current PCP Olga Millers, MD) and does need an [facility] hospital follow-up appointment after discharge.  The patient does not know have transportation limitations that hinder transportation to clinic appointments.  Signed: Duwaine Maxin, DO 04/24/2013, 12:01 AM

## 2013-04-25 DIAGNOSIS — E46 Unspecified protein-calorie malnutrition: Secondary | ICD-10-CM | POA: Diagnosis present

## 2013-04-25 DIAGNOSIS — F10931 Alcohol use, unspecified with withdrawal delirium: Secondary | ICD-10-CM

## 2013-04-25 DIAGNOSIS — J690 Pneumonitis due to inhalation of food and vomit: Secondary | ICD-10-CM

## 2013-04-25 DIAGNOSIS — F10231 Alcohol dependence with withdrawal delirium: Secondary | ICD-10-CM

## 2013-04-25 LAB — CBC
HCT: 31.3 % — ABNORMAL LOW (ref 39.0–52.0)
Hemoglobin: 11.3 g/dL — ABNORMAL LOW (ref 13.0–17.0)
MCH: 34.6 pg — AB (ref 26.0–34.0)
MCHC: 36.1 g/dL — ABNORMAL HIGH (ref 30.0–36.0)
MCV: 95.7 fL (ref 78.0–100.0)
PLATELETS: 175 10*3/uL (ref 150–400)
RBC: 3.27 MIL/uL — ABNORMAL LOW (ref 4.22–5.81)
RDW: 13.6 % (ref 11.5–15.5)
WBC: 13.6 10*3/uL — AB (ref 4.0–10.5)

## 2013-04-25 LAB — COMPREHENSIVE METABOLIC PANEL
ALT: 77 U/L — AB (ref 0–53)
AST: 157 U/L — ABNORMAL HIGH (ref 0–37)
Albumin: 3 g/dL — ABNORMAL LOW (ref 3.5–5.2)
Alkaline Phosphatase: 95 U/L (ref 39–117)
BUN: 4 mg/dL — ABNORMAL LOW (ref 6–23)
CALCIUM: 8 mg/dL — AB (ref 8.4–10.5)
CO2: 26 mEq/L (ref 19–32)
CREATININE: 0.63 mg/dL (ref 0.50–1.35)
Chloride: 94 mEq/L — ABNORMAL LOW (ref 96–112)
GFR calc non Af Amer: >90 mL/min (ref >90)
GLUCOSE: 101 mg/dL — AB (ref 70–99)
Potassium: 3.3 mEq/L — ABNORMAL LOW (ref 3.7–5.3)
Sodium: 135 mEq/L — ABNORMAL LOW (ref 137–147)
Total Bilirubin: 0.6 mg/dL (ref 0.3–1.2)
Total Protein: 6.7 g/dL (ref 6.0–8.3)

## 2013-04-25 LAB — CK TOTAL AND CKMB (NOT AT ARMC)
CK TOTAL: 237 U/L — AB (ref 7–232)
CK, MB: 1.6 ng/mL (ref 0.3–4.0)
Relative Index: 0.7 (ref 0.0–2.5)

## 2013-04-25 MED ORDER — LORAZEPAM 2 MG/ML IJ SOLN
4.0000 mg | Freq: Once | INTRAMUSCULAR | Status: AC
  Administered 2013-04-25: 4 mg via INTRAVENOUS
  Filled 2013-04-25: qty 2

## 2013-04-25 MED ORDER — POTASSIUM CHLORIDE 10 MEQ/100ML IV SOLN
10.0000 meq | INTRAVENOUS | Status: AC
  Administered 2013-04-25 (×4): 10 meq via INTRAVENOUS
  Filled 2013-04-25 (×4): qty 100

## 2013-04-25 MED ORDER — LORAZEPAM 2 MG/ML IJ SOLN
2.0000 mg | Freq: Once | INTRAMUSCULAR | Status: AC
  Administered 2013-04-25: 2 mg via INTRAVENOUS
  Filled 2013-04-25: qty 1

## 2013-04-25 MED ORDER — LORAZEPAM 2 MG/ML IJ SOLN
1.0000 mg | Freq: Three times a day (TID) | INTRAMUSCULAR | Status: DC
  Administered 2013-04-25 – 2013-04-27 (×5): 1 mg via INTRAVENOUS
  Filled 2013-04-25 (×5): qty 1

## 2013-04-25 MED ORDER — LORAZEPAM 2 MG/ML IJ SOLN: 2.0000 mg | INTRAMUSCULAR | Status: DC | PRN

## 2013-04-25 MED ORDER — LORAZEPAM 2 MG/ML IJ SOLN
1.0000 mg | INTRAMUSCULAR | Status: DC | PRN
  Administered 2013-04-26 – 2013-04-27 (×3): 1 mg via INTRAVENOUS
  Filled 2013-04-25 (×4): qty 1

## 2013-04-25 MED ORDER — HALOPERIDOL LACTATE 5 MG/ML IJ SOLN
5.0000 mg | Freq: Once | INTRAMUSCULAR | Status: AC
  Administered 2013-04-25: 5 mg via INTRAMUSCULAR

## 2013-04-25 MED ORDER — DEXMEDETOMIDINE HCL IN NACL 200 MCG/50ML IV SOLN
0.4000 ug/kg/h | INTRAVENOUS | Status: DC
  Administered 2013-04-25: 1 ug/kg/h via INTRAVENOUS
  Administered 2013-04-25: 0.5 ug/kg/h via INTRAVENOUS
  Administered 2013-04-26: 0.6 ug/kg/h via INTRAVENOUS
  Administered 2013-04-26: 0.8 ug/kg/h via INTRAVENOUS
  Filled 2013-04-25 (×5): qty 50

## 2013-04-25 MED ORDER — HALOPERIDOL LACTATE 5 MG/ML IJ SOLN
INTRAMUSCULAR | Status: AC
  Filled 2013-04-25: qty 1

## 2013-04-25 MED ORDER — LORAZEPAM 2 MG/ML IJ SOLN: 2.0000 mg | Freq: Once | INTRAMUSCULAR | Status: DC

## 2013-04-25 NOTE — Clinical Documentation Improvement (Signed)
Possible Clinical Conditions?  "   Hyponatremia  "   Other Condition  "   Cannot Clinically Determine    Diagnostics: 3/10: Sodium: 127 Treatment: D5% 0.9% NaCl @ 100 ml/hr  Thank You, Theron Arista, Clinical Documentation Specialist:  [place] Information Management

## 2013-04-25 NOTE — Care Management Note (Signed)
    Page 1 of 1   04/29/2013     2:22:02 PM   CARE MANAGEMENT NOTE 04/29/2013  Patient:  Joseph Underwood, Joseph Underwood   Account Number:  [id number]  Date Initiated:  04/25/2013  Documentation initiated by:  GRAVES-BIGELOW,BRENDA  Subjective/Objective Assessment:   Pt admitted for seizures. Treating for Sepsis 2/2 to pneumonia.     Action/Plan:   Pt has a PCP at the [facility]. No needs from CM a this time.   Anticipated DC Date:  04/27/2013   Anticipated DC Plan:  [place]  CM consult      Choice offered to / List presented to:             Status of service:  Completed, signed off Medicare Important Message given?   (If response is "NO", the following Medicare IM given date fields will be blank) Date Medicare IM given:   Date Additional Medicare IM given:    Discharge Disposition:  [place]  Per UR Regulation:  Reviewed for med. necessity/level of care/duration of stay  If discussed at [place] of Stay Meetings, dates discussed:    Comments:

## 2013-04-25 NOTE — Evaluation (Signed)
Clinical/Bedside Swallow Evaluation Patient Details  Name: Joseph Underwood MRN: [id number] Date of Birth: 05-Jun-1965  Today's Date: 04/25/2013 Time: 0900-0916 SLP Time Calculation (min): 16 min  Past Medical History:  Past Medical History  Diagnosis Date  . Seizures   . Alcohol abuse   . Hiccups   . HTN (hypertension)    Past Surgical History:  Past Surgical History  Procedure Laterality Date  . No past surgeries     HPI:  [name] is a 48 year old male with a PMH of EtOH abuse and w/d seizures who presented to the ED after apparent seizure at home. History was provided by the patient who is a questionable historian. He was at home in his usual state of health when he began to sweat, experienced blurry vision, racing heart and tremors. He reported his "dilantin was low." He denied CP/SOB, cough or cold symptoms, tongue biting or loss of bowel or bladder control. Patient reports dysphagia and feeling as if food may not have gone down properly 2 days prior to admission. Patient with leukocytosis and RLL infiltrate in the setting of possible seizure concerning for aspiration pneumonia. Swallow evaluation with SLP ordered.   Assessment / Plan / Recommendation Clinical Impression  Patient presents with a suspected primary esophageal dysphagia characterized by c/o globus with independent compensation by alternating dry solids with moist solids/liquids and what appears to be a functional oropharyngeal swallow. No overt indication of aspiration noted. SLP completed education with patient regarding findings and recommended compensatory strategies. No SLP f/u indicated at this time. Defer management of GERD-like symptoms (globus, back pain with po intake) to MD.     Aspiration Risk  Mild    Diet Recommendation Regular;Thin liquid   Liquid Administration via: Cup;Straw Medication Administration: Whole meds with liquid Supervision: Patient able to self feed Compensations: Slow rate;Small  sips/bites Postural Changes and/or Swallow Maneuvers: Seated upright 90 degrees;Upright 30-60 min after meal    Other  Recommendations Oral Care Recommendations: Oral care BID   Follow Up Recommendations  None       Pertinent Vitals/Pain n/a     Swallow Study    General HPI: [name] is a 48 year old male with a PMH of EtOH abuse and w/d seizures who presented to the ED after apparent seizure at home. History was provided by the patient who is a questionable historian. He was at home in his usual state of health when he began to sweat, experienced blurry vision, racing heart and tremors. He reported his "dilantin was low." He denied CP/SOB, cough or cold symptoms, tongue biting or loss of bowel or bladder control. Patient reports dysphagia and feeling as if food may not have gone down properly 2 days prior to admission. Patient with leukocytosis and RLL infiltrate in the setting of possible seizure concerning for aspiration pneumonia. Swallow evaluation with SLP ordered. Type of Study: Bedside swallow evaluation Previous Swallow Assessment: none reported by patient Diet Prior to this Study: NPO Temperature Spikes Noted: No Respiratory Status: Room air History of Recent Intubation: No Behavior/Cognition: Alert;Cooperative;Pleasant mood Oral Cavity - Dentition: Adequate natural dentition Self-Feeding Abilities: Able to feed self Patient Positioning: Upright in bed Baseline Vocal Quality: Clear Volitional Cough: Strong Volitional Swallow: Able to elicit    Oral/Motor/Sensory Function Overall Oral Motor/Sensory Function: Appears within functional limits for tasks assessed (mild generalized tremors)   Ice Chips Ice chips: Not tested   Thin Liquid Thin Liquid: Within functional limits Presentation: Cup;Self Fed;Straw  Nectar Thick Nectar Thick Liquid: Not tested   Honey Thick Honey Thick Liquid: Not tested   Puree Puree: Within functional limits Presentation: Self Fed;Spoon    Solid   GO   Jiselle Sheu MA, CCC-SLP [phone]  Solid: Within functional limits Presentation: [place] 04/25/2013,9:17 AM

## 2013-04-25 NOTE — Progress Notes (Signed)
INITIAL NUTRITION ASSESSMENT  DOCUMENTATION CODES Per approved criteria  -Severe malnutrition in the context of acute illness or injury -Underweight   INTERVENTION: Advancement of diet per MD discretion. Recommend Resource Breeze po TID, each supplement provides 250 kcal and 9 grams of protein, once diet order permits. If patient's has vomiting after meals with diet advancement, recommend GI evaluation. RD to continue to follow nutrition care plan.  NUTRITION DIAGNOSIS: Inadequate oral intake related to GI distress as evidenced by weight loss and family report.   Goal: Intake to meet >90% of estimated nutrition needs.  Monitor:  weight trends, lab trends, I/O's, diet advancement  Reason for Assessment: Health History  48 y.o. male  Admitting Dx: Aspiration pneumonia  ASSESSMENT: PMHx significant for ETOH abuse and seizures. Admitted s/p seizure at home. Work-up also reveals sepsis 2/2 aspiration PNA.  Pt reports that he drinks half a pint of liquor approximately 5 days out of the week. Per mother at bedside, pt with poor appetite and vomiting after most meals for approximately 1 month. Patient's usual body weight appears to be around 115 lb. Current body weight is down to 109 lb. Patient now meets underweight status. Mother at bedside lives with patient and confirms ongoing weight loss. Pt is currently in bathroom, unable to complete physical exam.  BSE completed by SLP this morning with recommendations for Regular diet with thin liquids.  Pt meets criteria for severe MALNUTRITION in the context of acute illness as evidenced by wt loss of 5% x <1 month and intake of <50% x at least 5 days.  Sodium is low at 135. Potassium is low, but trending up, at 3.3 Magnesium now repleted and WNL at 2.0 Triglycerides elevated at 1444 Meds of note include: D5 at 854 ml/hr, folic acid, IV magnesium sulfate, MVI, IV KCl, thiamine  Height: Ht Readings from Last 1 Encounters:  04/24/13 5'  5" (1.651 m)    Weight: Wt Readings from Last 1 Encounters:  04/24/13 109 lb 12.6 oz (49.8 kg)    Ideal Body Weight: 136 lb/61.8 kg  % Ideal Body Weight: 80%  Wt Readings from Last 10 Encounters:  04/24/13 109 lb 12.6 oz (49.8 kg)  04/23/13 116 lb 4.8 oz (52.753 kg)  02/01/13 113 lb 8 oz (51.483 kg)  12/31/12 115 lb 12.8 oz (52.527 kg)  10/28/11 116 lb 8 oz (52.844 kg)  07/28/11 119 lb 7.8 oz (54.2 kg)  07/18/11 115 lb 8 oz (52.39 kg)  03/10/11 118 lb (53.524 kg)  02/25/11 115 lb 9.6 oz (52.436 kg)  02/22/11 114 lb 3.2 oz (51.801 kg)    Usual Body Weight: approx 115 lb  % Usual Body Weight: 95%  BMI:  Body mass index is 18.27 kg/(m^2). Underweight  Estimated Nutritional Needs: Kcal: 1500 - 1700 Protein: 60 - 75 g Fluid: 1.5 - 1.7 liters daily  Skin: intact  Diet Order: NPO  EDUCATION NEEDS: -No education needs identified at this time   Intake/Output Summary (Last 24 hours) at 04/25/13 1001 Last data filed at 04/25/13 0834  Gross per 24 hour  Intake   2650 ml  Output   1600 ml  Net   1050 ml    Last BM: 3/11  Labs:   Recent Labs Lab 04/24/13 0242 04/24/13 1732 04/24/13 2043 04/25/13 0215  NA 135* 136* 138 135*  K 3.6* 2.9* 2.9* 3.3*  CL 90* 92* 92* 94*  CO2 21 27 28 26   BUN 6 5* 5* 4*  CREATININE 0.60 0.67  0.71 0.63  CALCIUM 7.6* 8.4 8.4 8.0*  MG 0.9*  --  2.0  --   GLUCOSE 108* 94 83 101*    CBG (last 3)  No results found for this basename: GLUCAP,  in the last 72 hours  Triglycerides  Date/Time Value Ref Range Status  04/24/2013  2:42 AM 1444* <150 mg/dL Final    Scheduled Meds: . enoxaparin (LOVENOX) injection  40 mg Subcutaneous Q24H  . folic acid  1 mg Oral Daily  . multivitamin with minerals  1 tablet Oral Daily  . phenytoin (DILANTIN) IV  100 mg Intravenous 3 times per day  . piperacillin-tazobactam (ZOSYN)  IV  3.375 g Intravenous Q8H  . pneumococcal 23 valent vaccine  0.5 mL Intramuscular Tomorrow-1000  . thiamine  100 mg  Oral Daily   Or  . thiamine  100 mg Intravenous Daily  . vancomycin  1,000 mg Intravenous Q12H    Continuous Infusions: . dextrose 5 % and 0.9% NaCl 100 mL/hr at 04/24/13 2044    Past Medical History  Diagnosis Date  . Seizures   . Alcohol abuse   . Hiccups   . HTN (hypertension)     Past Surgical History  Procedure Laterality Date  . No past surgeries      Inda Coke MS, RD, LDN Inpatient Registered Dietitian Pager: [phone] After-hours pager: [phone]

## 2013-04-25 NOTE — Progress Notes (Signed)
OOB, refused to get back to bed, security notified, MD notified and in to see patient.  CIWA 20.  Back to bed with security presence.  Ativan and haldol ordered.  Patient to be transferred to [place].  Mother notified.  Haldol given IM and 4 mg of ativan given.  Report called to RN, all questions answered.

## 2013-04-25 NOTE — Progress Notes (Signed)
I have seen the patient and reviewed the daily progress note by Ardith Dark MS IV and discussed the care of the patient with them.  See below for documentation of my findings, assessment, and plans.  Subjective: He stated feeling better this morning with no chest pain, SOB, abdominal pain, N/V/D, or dysuria.   Objective: Vital signs in last 24 hours: Filed Vitals:   04/24/13 2000 04/24/13 2321 04/25/13 0335 04/25/13 0700  BP: 140/93 149/97 144/99   Pulse: 94 98 99   Temp: 98.2 F (36.8 C) 98.3 F (36.8 C) 98.3 F (36.8 C) 98.5 F (36.9 C)  TempSrc: Oral Oral Oral Oral  Resp: 18 17 20    Height:      Weight:      SpO2: 97% 99% 97%    Weight change:   Intake/Output Summary (Last 24 hours) at 04/25/13 1716 Last data filed at 04/25/13 0834  Gross per 24 hour  Intake   2050 ml  Output   1600 ml  Net    450 ml  Vital signs reviewed General appearance: alert, cooperative, NAD  Head: Normocephalic, without obvious abnormality, atraumatic  Eyes: conjunctivae/corneas clear. Left eye swelling has decreased since yesterday  Lungs: clear to auscultation bilaterally, good air movement Heart: regular rate and rhythm, S1, S2 normal, no murmur, click, rub or gallop  Extremities: extremities normal, atraumatic, no cyanosis or edema  Neurologic: Alert and oriented x3, moves 4 extremities voluntarily, coarse hand tremors bilaterally  Lab Results: Reviewed and documented in Electronic Record Micro Results: Reviewed and documented in Electronic Record Studies/Results: Reviewed and documented in Electronic Record Medications: I have reviewed the patient's current medications. Scheduled Meds: . enoxaparin (LOVENOX) injection  40 mg Subcutaneous Q24H  . folic acid  1 mg Oral Daily  . LORazepam  2 mg Intravenous Once  . multivitamin with minerals  1 tablet Oral Daily  . phenytoin (DILANTIN) IV  100 mg Intravenous 3 times per day  . piperacillin-tazobactam (ZOSYN)  IV  3.375 g  Intravenous Q8H  . pneumococcal 23 valent vaccine  0.5 mL Intramuscular Tomorrow-1000  . thiamine  100 mg Oral Daily   Or  . thiamine  100 mg Intravenous Daily  . vancomycin  1,000 mg Intravenous Q12H   Continuous Infusions: . dextrose 5 % and 0.9% NaCl 100 mL/hr at 04/24/13 2044   PRN Meds:.LORazepam, LORazepam, LORazepam Assessment/Plan:  48 year old male with a PMH of alcohol dependence and withdrawal seizures, who is being treated for alcohol withdrawal along with sepsis secondary to a likely aspiration pneumonia.   1. Sepsis 2/2 to pneumonia: He reports dysphagia with feeling that food has gone down "the wrong way" prior to his presentation. He denies cough, URI symptoms, chest pain or dyspnea, however, he has leukocytosis and RLL infiltrate in the setting of possible seizure concerning for aspiration pneumonia. Urine Legionella and strep pneumo negative. WBC downtrending to 13.6. Remains afebrile. Passed SLP swallow evaluation, has a regular diet.Marland Kitchen HIV non reactive. Blood culture NGTD. - Continue Vanc and Zosyn (day 2)    2. Seizure 2/2 to EtOH withdrawal: Unclear if had seizure. Reports multiple episodes lately. He says he last drink was on the night prior to admission but EtOH level undetectable. Also, non-compliant with Dilantin. Dilantin loaded in ED. CK level elevated on admission to 483 with U/A moderate Hgb and 3-6 RBC. Repeat CK 237. AST>ALT by 2:1 ratio.  - Continue Dilantin 100mg  TID, IV for now but may need to adjust dose  -  CIWA protocol  - D5-NS  - Uninterested in talking to CSW or exploring rehab options at this time  - Ordered UDS   3. Increased AG metabolic acidosis: Likely combination of alcoholic ketoacidosis, starvation ketoacidosis and lactic acidosis. Lactic acid elevated to 8.76 initially, trended down to 1.2 with IVF. 60mEq of KCl given overnight and this morning. Mg normal.  - Continue D5-NS  - Treating for pneumonia as above  - Trending BMP   4. Hypokalemia  - Improving. K of 2.9 last night, adequately improved to 3.3 after IV K 44mEq x4.  -Recheck BMET this pm  5. Hypomagnesia - Resolved. Likely 2/2 to alcohol abuse. Mg of 0.9 yesterday, repleted with Magnesium 4 g, level normal to 2.0 today.   6. HTN--on HCTZ 25mg  at home. BP mildly elevated today, likely 2/2 to [facility] withdrawal.  - Holding po meds for now and in setting of sepsis.  - Ativan lowering BP for now   7. Hypertriglyceridemia--Tg 1444 on admission with Cholesterol 395. Initial lab results noted to be hemolyzed in setting of Lipemic specimen. Need to work on CV risk reduction and prevention of possible pancreatitis (especially given alcohol dependence). Statin therapy will likely be appropriate, however currently CK elevated.  - Continues to deny any abdominal pain with no signs of pancreatitis.    8. Tobacco dependence--Smokes ~1/2ppd along with alcohol. Cessation strongly recommended. Does not want nicotine patch.   Dispo: Disposition is deferred at this time, awaiting improvement of current medical problems.  Anticipated discharge in approximately 1-2 day(s).   The patient does have a current PCP Olga Millers, MD) and does need an [facility] hospital follow-up appointment after discharge.  The patient does not have transportation limitations that hinder transportation to clinic appointments.  .Services Needed at time of discharge: Y = Yes, Blank = No PT:   OT:   RN:   Equipment:   Other:     LOS: 2 days   Blain Pais, MD 04/25/2013, 5:16 PM

## 2013-04-25 NOTE — Progress Notes (Signed)
Subjective: Joseph Underwood reports feeling better this morning.  He denies any SOB or chest pain.  He denies any seizure events since being admitted.  His mother was present today.  She reports that he had not had any alcohol for several days at home and he started sweating and his heart was beating fast.  She reported that he had a seizure at home after his office visit and before she brought him to the ED.  He says he is not interested in any rehabilitation after discharge at this time.  Objective: Vital signs in last 24 hours: Filed Vitals:   04/24/13 2000 04/24/13 2321 04/25/13 0335 04/25/13 0700  BP: 140/93 149/97 144/99   Pulse: 94 98 99   Temp: 98.2 F (36.8 C) 98.3 F (36.8 C) 98.3 F (36.8 C) 98.5 F (36.9 C)  TempSrc: Oral Oral Oral Oral  Resp: 18 17 20    Height:      Weight:      SpO2: 97% 99% 97%    Weight change:   Intake/Output Summary (Last 24 hours) at 04/25/13 1013 Last data filed at 04/25/13 0834  Gross per 24 hour  Intake   2650 ml  Output   1600 ml  Net   1050 ml   BP 144/99  Pulse 99  Temp(Src) 98.5 F (36.9 C) (Oral)  Resp 20  Ht 5\' 5"  (1.651 m)  Wt 49.8 kg (109 lb 12.6 oz)  BMI 18.27 kg/m2  SpO2 97% General appearance: alert, cooperative, no distress and mild tremors Head: Normocephalic, without obvious abnormality, atraumatic Eyes: conjunctivae/corneas clear. PERRL, EOM's intact. Fundi benign., L eye swelling has decreased since yesterday Lungs: clear to auscultation bilaterally Heart: regular rate and rhythm, S1, S2 normal, no murmur, click, rub or gallop Extremities: extremities normal, atraumatic, no cyanosis or edema Neurologic: Grossly normal. Less somnolent than yesterday.  Lab Results: Results for orders placed during the hospital encounter of 04/23/13 (from the past 24 hour(s))  BASIC METABOLIC PANEL     Status: Abnormal   Collection Time    04/24/13  5:32 PM      Result Value Ref Range   Sodium 136 (*) 137 - 147 mEq/L   Potassium 2.9  (*) 3.7 - 5.3 mEq/L   Chloride 92 (*) 96 - 112 mEq/L   CO2 27  19 - 32 mEq/L   Glucose, Bld 94  70 - 99 mg/dL   BUN 5 (*) 6 - 23 mg/dL   Creatinine, Ser 0.67  0.50 - 1.35 mg/dL   Calcium 8.4  8.4 - 10.5 mg/dL   GFR calc non Af Amer >90  >90 mL/min   GFR calc Af Amer >90  >90 mL/min  BASIC METABOLIC PANEL     Status: Abnormal   Collection Time    04/24/13  8:43 PM      Result Value Ref Range   Sodium 138  137 - 147 mEq/L   Potassium 2.9 (*) 3.7 - 5.3 mEq/L   Chloride 92 (*) 96 - 112 mEq/L   CO2 28  19 - 32 mEq/L   Glucose, Bld 83  70 - 99 mg/dL   BUN 5 (*) 6 - 23 mg/dL   Creatinine, Ser 0.71  0.50 - 1.35 mg/dL   Calcium 8.4  8.4 - 10.5 mg/dL   GFR calc non Af Amer >90  >90 mL/min   GFR calc Af Amer >90  >90 mL/min  MAGNESIUM     Status: None   Collection Time  04/24/13  8:43 PM      Result Value Ref Range   Magnesium 2.0  1.5 - 2.5 mg/dL  CBC     Status: Abnormal   Collection Time    04/25/13  2:15 AM      Result Value Ref Range   WBC 13.6 (*) 4.0 - 10.5 K/uL   RBC 3.27 (*) 4.22 - 5.81 MIL/uL   Hemoglobin 11.3 (*) 13.0 - 17.0 g/dL   HCT 42.6 (*) 83.4 - 19.6 %   MCV 95.7  78.0 - 100.0 fL   MCH 34.6 (*) 26.0 - 34.0 pg   MCHC 36.1 (*) 30.0 - 36.0 g/dL   RDW 22.2  97.9 - 89.2 %   Platelets 175  150 - 400 K/uL  CK TOTAL AND CKMB     Status: Abnormal   Collection Time    04/25/13  2:15 AM      Result Value Ref Range   Total CK 237 (*) 7 - 232 U/L   CK, MB 1.6  0.3 - 4.0 ng/mL   Relative Index 0.7  0.0 - 2.5  COMPREHENSIVE METABOLIC PANEL     Status: Abnormal   Collection Time    04/25/13  2:15 AM      Result Value Ref Range   Sodium 135 (*) 137 - 147 mEq/L   Potassium 3.3 (*) 3.7 - 5.3 mEq/L   Chloride 94 (*) 96 - 112 mEq/L   CO2 26  19 - 32 mEq/L   Glucose, Bld 101 (*) 70 - 99 mg/dL   BUN 4 (*) 6 - 23 mg/dL   Creatinine, Ser 1.19  0.50 - 1.35 mg/dL   Calcium 8.0 (*) 8.4 - 10.5 mg/dL   Total Protein 6.7  6.0 - 8.3 g/dL   Albumin 3.0 (*) 3.5 - 5.2 g/dL   AST  417 (*) 0 - 37 U/L   ALT 77 (*) 0 - 53 U/L   Alkaline Phosphatase 95  39 - 117 U/L   Total Bilirubin 0.6  0.3 - 1.2 mg/dL   GFR calc non Af Amer >90  >90 mL/min   GFR calc Af Amer >90  >90 mL/min    Micro Results: Recent Results (from the past 240 hour(s))  CULTURE, BLOOD (ROUTINE X 2)     Status: None   Collection Time    04/23/13 11:38 PM      Result Value Ref Range Status   Specimen Description BLOOD LEFT FOREARM   Final   Special Requests BOTTLES DRAWN AEROBIC AND ANAEROBIC 10CC   Final   Culture  Setup Time     Final   Value: 04/24/2013 08:30     Performed at [facility]   Culture     Final   Value:        BLOOD CULTURE RECEIVED NO GROWTH TO DATE CULTURE WILL BE HELD FOR 5 DAYS BEFORE ISSUING A FINAL NEGATIVE REPORT     Performed at [facility]   Report Status PENDING   Incomplete  CULTURE, BLOOD (ROUTINE X 2)     Status: None   Collection Time    04/23/13 11:44 PM      Result Value Ref Range Status   Specimen Description BLOOD LEFT FOREARM   Final   Special Requests BOTTLES DRAWN AEROBIC AND ANAEROBIC 10CC   Final   Culture  Setup Time     Final   Value: 04/24/2013 08:30     Performed at [facility]   Culture     Final   Value:        BLOOD CULTURE RECEIVED NO GROWTH TO DATE CULTURE WILL BE HELD FOR 5 DAYS BEFORE ISSUING A FINAL NEGATIVE REPORT     Performed at [facility]   Report Status PENDING   Incomplete  MRSA PCR SCREENING     Status: None   Collection Time    04/24/13  1:25 AM      Result Value Ref Range Status   MRSA by PCR NEGATIVE  NEGATIVE Final   Comment:            The GeneXpert MRSA Assay (FDA     approved for NASAL specimens     only), is one component of a     comprehensive MRSA colonization     surveillance program. It is not     intended to diagnose MRSA     infection nor to guide or     monitor treatment for     MRSA infections.   Studies/Results: Dg Chest 2 View  04/23/2013   CLINICAL DATA Body aches.   EXAM CHEST  2 VIEW  COMPARISON March 30, 2013.  FINDINGS The heart size and mediastinal contours are within normal limits. Left lung is clear. Alveolar opacity is noted in right lower lobe consistent with pneumonia. No pneumothorax or pleural effusion is noted. The visualized skeletal structures are unremarkable.  IMPRESSION Right lower lobe pneumonia.  SIGNATURE  Electronically Signed   By: Sabino Dick M.D.   On: 04/23/2013 22:33   Medications: I have reviewed the patient's current medications. Scheduled Meds: . enoxaparin (LOVENOX) injection  40 mg Subcutaneous Q24H  . folic acid  1 mg Oral Daily  . multivitamin with minerals  1 tablet Oral Daily  . phenytoin (DILANTIN) IV  100 mg Intravenous 3 times per day  . piperacillin-tazobactam (ZOSYN)  IV  3.375 g Intravenous Q8H  . pneumococcal 23 valent vaccine  0.5 mL Intramuscular Tomorrow-1000  . potassium chloride  10 mEq Intravenous Q1 Hr x 4  . thiamine  100 mg Oral Daily   Or  . thiamine  100 mg Intravenous Daily  . vancomycin  1,000 mg Intravenous Q12H   Continuous Infusions: . dextrose 5 % and 0.9% NaCl 100 mL/hr at 04/24/13 2044   PRN Meds:.LORazepam, LORazepam, LORazepam Assessment/Plan: Principal Problem:   Aspiration pneumonia Active Problems:   ALCOHOL DEPENDENCE   CONVULSIONS, SEIZURES, NOS   Essential hypertension, benign   Sepsis   High anion gap metabolic acidosis   Lactic acidosis   Hypertriglyceridemia   Elevated CK   Tobacco dependence  Joseph Underwood is a 48 year old male with a PMH of alcohol dependence and withdrawal seizures, who is being treated for alcohol withdrawal along with sepsis secondary to a likely aspiration pneumonia.  1.  Sepsis 2/2 to pneumonia: Patient reports dysphagia and feeling as if food may not have not gone down properly two days ago. He told the ED he had body aches and had not been feeling well. He denies cough, URI symptoms, chest pain or dyspnea, however patient with leukocytosis and  RLL infiltrate in the setting of possible seizure concerning for aspiration pneumonia. Tmax 100F. Sinus tachycardia but no chest pain, lower extremity pain or swelling, immobility to suggest PE.  Urine Legionella and strep pneumo negative.  WBC downtrending to 13.6. - Continue Vanc and Zosyn (day 2) .   - Continue NPO for now.  Passed swallow  study, but want to check and make sure next dose of Ativan does not make him too somnolent.  If he remains alert, will advance his diet.   - Pending HIV, sputum cx, blood cx.  2. Increased AG metabolic acidosis: Likely combination of alcoholic ketoacidosis, starvation ketoacidosis and lactic acidosis. Lactic acid elevated, 8.76 in setting of sepsis.  Lactic acid 1.2 yesterday.  66mEq of KCl given overnight and this morning. Mg normal.  - Continue D5-NS  - Treating for pneumonia as above  - Trending BMP   3. Seizure 2/2 to EtOH withdrawal: Unclear if he had a seizure today but reports multiple episodes lately. He says he last drink was on the night prior to admission but EtOH level undetectable. Also, non-compliant with Dilantin. Dilantin loaded in ED. CK level elevated on admission to 483 with U/A moderate Hgb and 3-6 RBC.   Repeat CK 237.  AST>ALT by 2:1 ratio. - Continue Dilantin 100mg  TID, IV for now but may need to adjust dose  - CIWA protocol  - D5-NS  - Uninterested in talking to SW or exploring rehab options at this time  4. HTN--on HCTZ 25mg  at home.  - Holding po meds for now and in setting of sepsis.  - Ativan lowering BP for now   5. Hypertriglyceridemia--Tg 1444 on admission with Cholesterol 395. All lab results noted to be hemolyzed in setting of Lipemic specimen. Need to work on CV risk reduction and prevention of possible pancreatitis (especially given alcohol dependence). Statin therapy will likely be appropriate, however currently CK elevated.  - Continues to deny any abdominal pain, so will not treat for now.  6. Tobacco  dependence--Smokes ~1/2ppd along with alcohol. Cessation strongly recommended.   Diet: Passed swallow eval, but still somnolent with CIWA protocol likely in combination with Dilantin loading. NPO for now, but will advance if he tolerates Ativan dose.  VTE ppx: [place] heparin   Dispo: Disposition is deferred at this time, awaiting improvement of current medical problems. Anticipated discharge in approximately 1-2 day(s).   The patient does have a current PCP Olga Millers, MD) and does need an [facility] hospital follow-up appointment after discharge.   This is a Careers information officer Note.  The care of the patient was discussed with Dr. Randell Loop and the assessment and plan formulated with their assistance.  Please see their attached note for official documentation of the daily encounter.   LOS: 2 days   Judy Pimple, Med Student 04/25/2013, 10:13 AM

## 2013-04-25 NOTE — Consult Note (Signed)
PULMONARY / CRITICAL CARE MEDICINE   Name: Joseph Underwood MRN: [id number] DOB: 1965-04-11    ADMISSION DATE:  04/23/2013 CONSULTATION DATE:  04/25/13  REFERRING MD :  Dr Murlean Caller PRIMARY SERVICE: IMTS  CHIEF COMPLAINT:  EtOH withdrawal delirium  BRIEF PATIENT DESCRIPTION: 48 yo man ETOH abuse, seizures. Admitted with withdrawal and RLL PNA. To ICU 3/12 for precedex.   SIGNIFICANT EVENTS / STUDIES:  TTE 07/28/11 >> normal LV cavity size and fxn, LVH, normal RV  LINES / TUBES:  CULTURES: Blood 3/10 >> resp 3/11 >> not collected   ANTIBIOTICS: Pip/tazo 3/11 >>  vanco 3/11 >>   HISTORY OF PRESENT ILLNESS:  48 yo man with EtOH abuse, active drinker, tobacco use, hx seizure d/o. Brought to the ED 3/10 after a seizure. CXR showed RLL infiltrate. He was treated for CAP and then changed to vanco + pip/tazo. He has experienced increasing agitation, delirium and ativan needs. Moved to ICU to initiate precedex 3/12.   PAST MEDICAL HISTORY :  Past Medical History  Diagnosis Date  . Seizures   . Alcohol abuse   . Hiccups   . HTN (hypertension)    Past Surgical History  Procedure Laterality Date  . No past surgeries     Prior to Admission medications   Medication Sig Start Date End Date Taking? Authorizing Provider  hydrochlorothiazide (HYDRODIURIL) 25 MG tablet Take 1 tablet (25 mg total) by mouth daily. 03/30/13  Yes Carmin Muskrat, MD  phenytoin (DILANTIN) 100 MG ER capsule Take 1 capsule (100 mg total) by mouth 3 (three) times daily. 03/30/13  Yes Carmin Muskrat, MD   No Known Allergies  FAMILY HISTORY:  No family history on file. SOCIAL HISTORY:  reports that he has been smoking Cigarettes.  He has a 12.5 pack-year smoking history. He has never used smokeless tobacco. He reports that he drinks alcohol. He reports that he does not use illicit drugs.  REVIEW OF SYSTEMS:    SUBJECTIVE:   VITAL SIGNS: Temp:  [97.9 F (36.6 C)-98.7 F (37.1 C)] 98.6 F (37 C) (03/12  1940) Pulse Rate:  [95-123] 95 (03/12 2000) Resp:  [12-24] 24 (03/12 2000) BP: (135-158)/(82-103) 135/82 mmHg (03/12 2000) SpO2:  [97 %-100 %] 97 % (03/12 2000) HEMODYNAMICS:   VENTILATOR SETTINGS:   INTAKE / OUTPUT: Intake/Output     03/12 0701 - 03/13 0700   P.O. 240   I.V. (mL/kg) 906.8 (18.2)   IV Piggyback 50   Total Intake(mL/kg) 1196.8 (24)   Urine (mL/kg/hr) 1075 (1.6)   Total Output 1075   Net +121.8       Stool Occurrence 6 x     PHYSICAL EXAMINATION: General:  Ill appearing young man Neuro:  Very sedated, grimace to pain, otherwise obtunded HEENT:  Op clear Cardiovascular:  Regular, no M Lungs:  Snoring respirations, referred UA noise, difficult to hear lung sounds Abdomen:  Soft benign Musculoskeletal:  No deformities Skin:  No rash  LABS:  CBC  Recent Labs Lab 04/23/13 2043 04/24/13 0315 04/25/13 0215  WBC 22.5* 18.0* 13.6*  HGB 14.7 11.9* 11.3*  HCT 39.4 32.7* 31.3*  PLT 259 197 175   Coag's No results found for this basename: APTT, INR,  in the last 168 hours BMET  Recent Labs Lab 04/24/13 1732 04/24/13 2043 04/25/13 0215  NA 136* 138 135*  K 2.9* 2.9* 3.3*  CL 92* 92* 94*  CO2 27 28 26   BUN 5* 5* 4*  CREATININE 0.67 0.71 0.63  GLUCOSE  94 83 101*   Electrolytes  Recent Labs Lab 04/24/13 0242 04/24/13 1732 04/24/13 2043 04/25/13 0215  CALCIUM 7.6* 8.4 8.4 8.0*  MG 0.9*  --  2.0  --    Sepsis Markers  Recent Labs Lab 04/23/13 2042 04/23/13 2051 04/24/13 0242  LATICACIDVEN 7.3* 8.76* 1.2   ABG No results found for this basename: PHART, PCO2ART, PO2ART,  in the last 168 hours Liver Enzymes  Recent Labs Lab 04/23/13 2043 04/25/13 0215  AST 1094* 157*  ALT 213* 77*  ALKPHOS 135* 95  BILITOT 0.7 0.6  ALBUMIN 3.8 3.0*   Cardiac Enzymes  Recent Labs Lab 04/23/13 2133  TROPONINI <0.30   Glucose No results found for this basename: GLUCAP,  in the last 168 hours  Imaging Dg Chest 2 View  04/23/2013    CLINICAL DATA Body aches.  EXAM CHEST  2 VIEW  COMPARISON March 30, 2013.  FINDINGS The heart size and mediastinal contours are within normal limits. Left lung is clear. Alveolar opacity is noted in right lower lobe consistent with pneumonia. No pneumothorax or pleural effusion is noted. The visualized skeletal structures are unremarkable.  IMPRESSION Right lower lobe pneumonia.  SIGNATURE  Electronically Signed   By: Sabino Dick M.D.   On: 04/23/2013 22:33   CXR:  3/10 > RLL alveolar infiltrate without consolidation, reviewed by me.   ASSESSMENT / PLAN:  PULMONARY A: Hypoxemia, possible OSA RLL PNA P:   - abx as below - would not tolerate CPAP, would become too agitated - need to lighten sedation some and follow oxygenation - pulm toilet   CARDIOVASCULAR A: No acute issues P:  - telemetry  RENAL A:  Hypokalemia P:   - replaced - follow BMP  GASTROINTESTINAL A:  No issues P:   - GI prophylaxis  HEMATOLOGIC A:  leukocytosis P:  - follow CBC  INFECTIOUS A:  RLL PNA, suspect aspiration PNA due to seizure P:   - agree with coverage for anaerobes with probable aspiration - can likely d/c vanco soon unless cx's reveal MRSA  NEUROLOGIC A:  EtOH withdrawal delirium Seizure disorder P:   - dilantin as ordered - agree with precedex, will attempt to lighten given his current MS - schedule lower dose ativan and use ativan prn as well.  - thiamine + folate  TODAY'S SUMMARY: EtOH withdrawal, aspiration PNA, lightening up precedex pm 3/12  I have personally obtained a history, examined the patient, evaluated laboratory and imaging results, formulated the assessment and plan and placed orders. CRITICAL CARE: The patient is critically ill with multiple organ systems failure and requires high complexity decision making for assessment and support, frequent evaluation and titration of therapies, application of advanced monitoring technologies and extensive interpretation of  multiple databases. Critical Care Time devoted to patient care services described in this note is 40 minutes.    Joseph Apo, MD, PhD 04/25/2013, 9:18 PM [facility] [phone] or if no answer [phone]

## 2013-04-25 NOTE — Clinical Documentation Improvement (Signed)
Possible Clinical Conditions?  Severe Malnutrition   Severe Protein Calorie Malnutrition Emaciation  Other Condition Cannot clinically determine   Risk Factors: Patient meets criteria for Severe Malnutrition in the context of acute illness as evidenced by weight loss of 5% x < 1 month and intake of < 50% x at least 5 days, per 04/25/13 RD's evaluation.  Treatment: Resource Breeze po TID.  Thank You, Theron Arista, Clinical Documentation Specialist:  [phone]  [place] Information Management

## 2013-04-25 NOTE — Progress Notes (Signed)
Was paged that pt wants to leave AMA. Went to evaluate the patient. Pt states he wants to "leave for something very important." He had a clear plan and understood and repeated the risks and benefits of leaving the hospital that included death. He stated he would not be alone and if developed any serious side effects would report immediately to medical attention. Pt was oriented x3 and competent. Pt was not suicidal or homicidal. Pt was very aggressive to staff and said needed cigarettes.   Pt had stable VSS. Slight tremors.  -2mg  ativan given  -sitter  Was then paged again that pt had become agitated and was unstable on his feet along with hallucinations. Went to evaluate pt and found that security had been called and pt was in a binder. Was not oriented to place or time was very tremulous. Pt last vitals BP 150/90s, 130s HR, 99% RA, and afebrile. Pt was still diaphoretic after 6mg  in 30 in time frame and having CIWA 19-20s despite this intervention.   -4mg  stat Ativan, 5mg  IM haldol  -PCCM to transfer   Clinton Gallant, MD  Pgr: [phone]

## 2013-04-25 NOTE — Progress Notes (Signed)
[facility] Progress Note Patient Name: Joseph Underwood DOB: 05-15-1965 MRN: [id number]  Date of Service  04/25/2013   HPI/Events of Note   Called by Internal Medicine Teaching service for evaluation.  Patient in DT's, agitated, combative, striking out at nurses. Has received multiple doses of ativan in last 30 minutes; now receiving Haldol and is restrained  eICU Interventions  Move to ICU for precedex gtt Bedside PCCM MD to evaluate   Intervention Category Minor Interventions: Agitation / anxiety - evaluation and management  MCQUAID, DOUGLAS 04/25/2013, 6:23 PM

## 2013-04-25 NOTE — Progress Notes (Addendum)
MD in to see patient and wants to go home for 15 minutes to pay a bill and smoke.  Discussed with patient he is too sick to leave hospital.  Offered nicotine patch or gum, patient refused. States, " I want to leave."   MD discussed patient, he can sign AMA.  Ativan 2mg  given per order  Very unstable on feet, but persistent on leaving.  Family notified and tried to talk with patient. Now getting dressed. Candy, house coverage notified.

## 2013-04-25 NOTE — Progress Notes (Addendum)
Mother nervous and left due to patient agitation.  Oriented to person, place, time and situation.  Has tremors, sweating and itching, CIWA 16. OOB, security again notified, patient trying to leave hospital.  MD notified and ordered 1mg  of ativan IV.  Will come up to see patient. Back to bed when security in room.  Once security left, back oob. Awaiting MD to see patient.

## 2013-04-25 NOTE — Progress Notes (Signed)
Awake out of bed and threatening to leave hospital for a smoke.  Offered to get nicotine patch, refused.  CIWA 35, MD notified. Patient oob and trying to get dressed.  Security notified and patient willingly back to bed.  Ativan given.

## 2013-04-25 NOTE — Progress Notes (Signed)
  Date: 04/25/2013  Patient name: Joseph Underwood  Medical record number: [id number]  Date of birth: 06-03-1965   This patient has been seen and the plan of care was discussed with the house staff. Please see their note for complete details. I concur with their findings with the following additions/corrections: Clinically improved today. He has evidence of hypokalemia and recent hyponatremia that are all slowly improving. He has sepsis due to aspiration PNA. I suspect recent seizure that resulted in this. Etiology likely alcohol withdrawal vs underlying primary seizure disorder. For now, given that he has been afebrile and clinically improving, we can narrow down abx therapy for aspiration. I don't think he needs to continue vancomycin. I would continue zosyn one more day then change to oral abx (augmentin, clindamycin, etc). Continue phenytoin. I would draw a level before he is discharged. Expect 1-2 more days inpatient.  Dominic Pea, DO, [facility] 04/25/2013, 3:54 PM

## 2013-04-26 DIAGNOSIS — J189 Pneumonia, unspecified organism: Secondary | ICD-10-CM

## 2013-04-26 LAB — BASIC METABOLIC PANEL
BUN: 4 mg/dL — ABNORMAL LOW (ref 6–23)
CALCIUM: 8.6 mg/dL (ref 8.4–10.5)
CO2: 23 meq/L (ref 19–32)
CREATININE: 0.62 mg/dL (ref 0.50–1.35)
Chloride: 101 mEq/L (ref 96–112)
GFR calc Af Amer: >90 mL/min (ref >90)
GFR calc non Af Amer: >90 mL/min (ref >90)
Glucose, Bld: 114 mg/dL — ABNORMAL HIGH (ref 70–99)
Potassium: 3.3 mEq/L — ABNORMAL LOW (ref 3.7–5.3)
Sodium: 140 mEq/L (ref 137–147)

## 2013-04-26 LAB — VANCOMYCIN, TROUGH: Vancomycin Tr: 13.6 ug/mL (ref 10.0–20.0)

## 2013-04-26 MED ORDER — PIPERACILLIN-TAZOBACTAM 3.375 G IVPB
3.3750 g | Freq: Three times a day (TID) | INTRAVENOUS | Status: AC
  Administered 2013-04-26: 3.375 g via INTRAVENOUS
  Filled 2013-04-26: qty 50

## 2013-04-26 MED ORDER — AMLODIPINE BESYLATE 5 MG PO TABS
5.0000 mg | ORAL_TABLET | Freq: Every day | ORAL | Status: DC
  Administered 2013-04-26: 5 mg via ORAL
  Filled 2013-04-26 (×2): qty 1

## 2013-04-26 MED ORDER — VANCOMYCIN HCL IN DEXTROSE 1-5 GM/200ML-% IV SOLN
1000.0000 mg | Freq: Three times a day (TID) | INTRAVENOUS | Status: AC
  Administered 2013-04-26: 1000 mg via INTRAVENOUS
  Filled 2013-04-26: qty 200

## 2013-04-26 MED ORDER — HYDRALAZINE HCL 20 MG/ML IJ SOLN
2.0000 mg | INTRAMUSCULAR | Status: DC | PRN
  Administered 2013-04-26: 2 mg via INTRAVENOUS
  Filled 2013-04-26: qty 1

## 2013-04-26 MED ORDER — VANCOMYCIN HCL IN DEXTROSE 1-5 GM/200ML-% IV SOLN
1000.0000 mg | Freq: Three times a day (TID) | INTRAVENOUS | Status: DC
  Administered 2013-04-26 (×2): 1000 mg via INTRAVENOUS
  Filled 2013-04-26 (×4): qty 200

## 2013-04-26 MED ORDER — SODIUM CHLORIDE 0.9 % IV SOLN: 3.0000 g | Freq: Four times a day (QID) | INTRAVENOUS | Status: DC

## 2013-04-26 MED ORDER — POTASSIUM CHLORIDE CRYS ER 20 MEQ PO TBCR
40.0000 meq | EXTENDED_RELEASE_TABLET | Freq: Three times a day (TID) | ORAL | Status: AC
  Administered 2013-04-26: 40 meq via ORAL
  Filled 2013-04-26: qty 2

## 2013-04-26 MED ORDER — POTASSIUM CHLORIDE 10 MEQ/100ML IV SOLN
10.0000 meq | INTRAVENOUS | Status: DC
  Administered 2013-04-26 (×2): 10 meq via INTRAVENOUS
  Filled 2013-04-26 (×2): qty 100

## 2013-04-26 MED ORDER — AMOXICILLIN-POT CLAVULANATE 875-125 MG PO TABS
1.0000 | ORAL_TABLET | Freq: Two times a day (BID) | ORAL | Status: DC
  Administered 2013-04-27: 1 via ORAL
  Filled 2013-04-26 (×4): qty 1

## 2013-04-26 NOTE — Progress Notes (Signed)
Joseph Underwood [id number]  Transfer Data: 04/26/2013 2:38 PM  Attending Provider: Dominic Pea, DO  EYC:[id number], ELIZABETH, MD  Code Status: Full  Joseph Underwood is a 48 y.o. male patient transferred from [place]  -No acute distress noted.  -No complaints of shortness of breath.  -No complaints of chest pain.  Cardiac Monitoring:  Box # 11 in place.    IV Fluids: IV in place, occlusive dsg intact without redness, IV cath forearm right, condition patent and no redness  D5W/0.9 NaCl.  Allergies: Review of patient's allergies indicates no known allergies.  Past Medical History:  has a past medical history of Seizures; Alcohol abuse; Hiccups; and HTN (hypertension).  Past Surgical History:  has past surgical history that includes No past surgeries.  Social History:  reports that he has been smoking Cigarettes.  He has a 12.5 pack-year smoking history. He has never used smokeless tobacco. He reports that he drinks alcohol. He reports that he does not use illicit drugs.  Skin: Intact   Patient/Family orientated to room. Information packet given to patient/family. Admission inpatient armband information verified with patient/family to include name and date of birth and placed on patient arm. Side rails up x 2, fall assessment and education completed with patient/family. Patient/family able to verbalize understanding of risk associated with falls and verbalized understanding to call for assistance before getting out of bed. Call light within reach. Patient/family able to voice and demonstrate understanding of unit orientation instructions.  Will continue to evaluate and treat per MD orders.

## 2013-04-26 NOTE — Progress Notes (Signed)
Observed pt mental status improving.  Pt is responding to safe limit standards and following instructions. Alert and Oriented X4.  Sitter present at bedside. Restraints discontinued.

## 2013-04-26 NOTE — Progress Notes (Signed)
Pt tx to 5W6 on tele, patient stable, neuro intact, and tolerated transferred well, safety sitter and belongings sent with pt

## 2013-04-26 NOTE — Progress Notes (Signed)
Writer of this note received report from previous nurse that pt Bp was elevated. Re-checked pt Bp 175/113. Paged MD. Awaiting for orders. Will continue to monitor.

## 2013-04-26 NOTE — Progress Notes (Signed)
[facility] ADULT ICU REPLACEMENT PROTOCOL FOR AM LAB REPLACEMENT ONLY  The patient does apply for the [facility] Adult ICU Electrolyte Replacment Protocol based on the criteria listed below:   1. Is GFR >/= 40 ml/min? yes  Patient's GFR today is >90 2. Is urine output >/= 0.5 ml/kg/hr for the last 6 hours? yes Patient's UOP is 2.76 ml/kg/hr 3. Is BUN < 60 mg/dL? yes  Patient's BUN today is 4 4. Abnormal electrolyte(s): K+ 3.3 5. Ordered repletion with: see order 6. If a panic level lab has been reported, has the CCM MD in charge been notified? yes.   Physician:  Dr. Janalyn Harder, [place] Certain A 04/26/2013 6:00 AM

## 2013-04-26 NOTE — Progress Notes (Signed)
Follow up BP after giving pt norvasc was 185/110. Afternoon pager was called and MD aware and notified. Currently awaiting orders, will continue to monitor.

## 2013-04-26 NOTE — Progress Notes (Signed)
Patient ID: Joseph Underwood, male   DOB: 10-Mar-1965, 48 y.o.   MRN: [id number]  Transfer Summary:  [name] is a 48 year old male with a history of previous alcohol withdrawal seizures secondary to noncompliance to Dilantin, who was admitted to the IMTS service on 3/11.  His HPI included sweting, blurry vision, racing heart and tremors after being 24 hours without any alcohol.  He then reports that he had a seizure.  On exam, he was found to have sepsis secondary to a right lower lobe pneumonia likely secondary to aspiration.  He was placed on ICU CIWA protocol.  He initially had a lactic acidosis which had normalized, as well as an anion gap and CK that were downtrending.  On 3/12, he was less somnolent on exam and reported that he was feeling better.  However, unfortunately, later that afternoon he became extremely agitated and combative.   He attempted to leave AMA and was aggressive with the hospital security.  He was experiencing tremors at that time.  The ativan was not successful in decreasing his agitation (he received 6mg  in 30 minutes with CIWA scores of 19-20).  He was given IM Haldol.  He was placed on a Precedex drip and transferred to the ICU.  He was continued on the Precedex drip in the ICU until noon on 3/13 when it was discontinued.  At that time he remained stable, and was no longer combative.  Restraints were able to be removed, and he was transferred back to the care of the IMTS.    Objective: Vital signs in last 24 hours: Filed Vitals:   04/26/13 0900 04/26/13 1000 04/26/13 1100 04/26/13 1149  BP: 112/89 127/98 125/85   Pulse: 81 84 91   Temp:    98.1 F (36.7 C)  TempSrc:    Oral  Resp: 23 21 26    Height:      Weight:      SpO2: 98% 100% 100%    Weight change:   Intake/Output Summary (Last 24 hours) at 04/26/13 1251 Last data filed at 04/26/13 1012  Gross per 24 hour  Intake 1817.45 ml  Output   1625 ml  Net 192.45 ml   BP 125/85  Pulse 91  Temp(Src) 98.1 F  (36.7 C) (Oral)  Resp 26  Ht 5\' 5"  (1.651 m)  Wt 49.8 kg (109 lb 12.6 oz)  BMI 18.27 kg/m2  SpO2 100% General appearance: alert, cooperative and no distress Head: Normocephalic, without obvious abnormality, atraumatic Eyes: conjunctivae/corneas clear. PERRL, EOM's intact. Fundi benign. Lungs: clear to auscultation bilaterally Heart: regular rate and rhythm, S1, S2 normal, no murmur, click, rub or gallop Abdomen: soft, non-tender; bowel sounds normal; no masses,  no organomegaly Extremities: extremities normal, atraumatic, no cyanosis or edema Neurologic: Alert and oriented X 3, normal strength and tone. Normal symmetric reflexes. Normal coordination and gait  Lab Results: Results for orders placed during the hospital encounter of 04/23/13 (from the past 24 hour(s))  BASIC METABOLIC PANEL     Status: Abnormal   Collection Time    04/26/13  2:59 AM      Result Value Ref Range   Sodium 140  137 - 147 mEq/L   Potassium 3.3 (*) 3.7 - 5.3 mEq/L   Chloride 101  96 - 112 mEq/L   CO2 23  19 - 32 mEq/L   Glucose, Bld 114 (*) 70 - 99 mg/dL   BUN 4 (*) 6 - 23 mg/dL   Creatinine, Ser  0.62  0.50 - 1.35 mg/dL   Calcium 8.6  8.4 - 10.5 mg/dL   GFR calc non Af Amer >90  >90 mL/min   GFR calc Af Amer >90  >90 mL/min  VANCOMYCIN, TROUGH     Status: None   Collection Time    04/26/13  2:59 AM      Result Value Ref Range   Vancomycin Tr 13.6  10.0 - 20.0 ug/mL    Micro Results: Recent Results (from the past 240 hour(s))  CULTURE, BLOOD (ROUTINE X 2)     Status: None   Collection Time    04/23/13 11:38 PM      Result Value Ref Range Status   Specimen Description BLOOD LEFT FOREARM   Final   Special Requests BOTTLES DRAWN AEROBIC AND ANAEROBIC 10CC   Final   Culture  Setup Time     Final   Value: 04/24/2013 08:30     Performed at [facility]   Culture     Final   Value:        BLOOD CULTURE RECEIVED NO GROWTH TO DATE CULTURE WILL BE HELD FOR 5 DAYS BEFORE ISSUING A FINAL NEGATIVE  REPORT     Performed at [facility]   Report Status PENDING   Incomplete  CULTURE, BLOOD (ROUTINE X 2)     Status: None   Collection Time    04/23/13 11:44 PM      Result Value Ref Range Status   Specimen Description BLOOD LEFT FOREARM   Final   Special Requests BOTTLES DRAWN AEROBIC AND ANAEROBIC 10CC   Final   Culture  Setup Time     Final   Value: 04/24/2013 08:30     Performed at [facility]   Culture     Final   Value:        BLOOD CULTURE RECEIVED NO GROWTH TO DATE CULTURE WILL BE HELD FOR 5 DAYS BEFORE ISSUING A FINAL NEGATIVE REPORT     Performed at [facility]   Report Status PENDING   Incomplete  MRSA PCR SCREENING     Status: None   Collection Time    04/24/13  1:25 AM      Result Value Ref Range Status   MRSA by PCR NEGATIVE  NEGATIVE Final   Comment:            The GeneXpert MRSA Assay (FDA     approved for NASAL specimens     only), is one component of a     comprehensive MRSA colonization     surveillance program. It is not     intended to diagnose MRSA     infection nor to guide or     monitor treatment for     MRSA infections.   Studies/Results: No results found. Medications: I have reviewed the patient's current medications. Scheduled Meds: . enoxaparin (LOVENOX) injection  40 mg Subcutaneous Q24H  . folic acid  1 mg Oral Daily  . LORazepam  1 mg Intravenous 3 times per day  . multivitamin with minerals  1 tablet Oral Daily  . phenytoin (DILANTIN) IV  100 mg Intravenous 3 times per day  . piperacillin-tazobactam (ZOSYN)  IV  3.375 g Intravenous Q8H  . pneumococcal 23 valent vaccine  0.5 mL Intramuscular Tomorrow-1000  . potassium chloride  40 mEq Oral TID  . thiamine  100 mg Oral Daily  . vancomycin  1,000 mg Intravenous Q8H   Continuous Infusions: .  dexmedetomidine 0.6 mcg/kg/hr (04/26/13 0626)  . dextrose 5 % and 0.9% NaCl 100 mL/hr at 04/25/13 2000   PRN Meds:.LORazepam Assessment/Plan: Principal Problem:    Aspiration pneumonia Active Problems:   ALCOHOL DEPENDENCE   CONVULSIONS, SEIZURES, NOS   Essential hypertension, benign   Sepsis   High anion gap metabolic acidosis   Lactic acidosis   Hypertriglyceridemia   Elevated CK   Tobacco dependence   Unspecified protein-calorie malnutrition   Alcohol withdrawal delirium  48 year old male with a PMH of alcohol dependence and withdrawal seizures, who is being treated for alcohol withdrawal along with sepsis secondary to a likely aspiration pneumonia.   1. Sepsis 2/2 to pneumonia: He reports dysphagia with feeling that food has gone down "the wrong way" prior to his presentation. He denies cough, URI symptoms, chest pain or dyspnea, however, he has leukocytosis and RLL infiltrate in the setting of possible seizure concerning for aspiration pneumonia. Urine Legionella and strep pneumo negative. WBC downtrending to 13.6. Remains afebrile. Passed SLP. swallow evaluation, has a regular diet.Marland Kitchen HIV non reactive.  - Blood culture NGTD.  - Continue Vanc and Zosyn today.  Will discontinue after midnight.  Had originally planned to start Unasyn since he has negative blood cultures, but Pharmacy reports a shortage.  Will start PO Augmentin tomorrow.  2. Seizure 2/2 to EtOH withdrawal: Unclear if had seizure. Reports multiple episodes lately. He says he last drink was on the night prior to admission but EtOH level undetectable. Also, non-compliant with Dilantin. Dilantin loaded in ED. CK level elevated on admission to 483 with U/A moderate Hgb and 3-6 RBC. Repeat CK 237. AST>ALT by 2:1 ratio.  - Continue Dilantin 100mg  TID, IV for now but may need to adjust dose  - ICU CIWA protocol  - D5-NS 162mL/hr - Uninterested in talking to CSW or exploring rehab options at this time   3. Increased AG metabolic acidosis: Likely combination of alcoholic ketoacidosis, starvation ketoacidosis and lactic acidosis. Lactic acid elevated to 8.76 initially, trended down to 1.2 with  IVF. 6mEq of KCl given overnight and this morning. Mg normal.  - Continue D5-NS  - Treating for pneumonia as above  - Trending BMP   4. Hypokalemia - Improving. K of 2.9 last night, adequately improved to 3.3 after IV K 80mEq x4.  -Recheck BMET this pm   5. Hypomagnesia - Resolved. Likely 2/2 to alcohol abuse. Mg of 0.9 yesterday, repleted with Magnesium 4 g, level normal to 2.0 today.   6. HTN--on HCTZ 25mg  at home. BP mildly elevated today, likely 2/2 to [facility] withdrawal.  - Holding home HCTZ given dehydrated state - Will start Norvasc 5mg  daily - BP still high - added Hydralazine 2mg  for SBP > 180  7. Hypertriglyceridemia--Tg 1444 on admission with Cholesterol 395. Initial lab results noted to be hemolyzed in setting of Lipemic specimen. Need to work on CV risk reduction and prevention of possible pancreatitis (especially given alcohol dependence). Statin therapy will likely be appropriate, however currently CK elevated.  - Continues to deny any abdominal pain with no signs of pancreatitis.   8. Tobacco dependence--Smokes ~1/2ppd along with alcohol. Cessation strongly recommended. Does not want nicotine patch.   This is a Careers information officer Note.  The care of the patient was discussed with Dr. Randell Loop and the assessment and plan formulated with their assistance.  Please see their attached note for official documentation of the daily encounter.   LOS: 3 days   Judy Pimple, Med  Student 04/26/2013, 12:51 PM

## 2013-04-26 NOTE — Progress Notes (Signed)
ANTIBIOTIC CONSULT NOTE - FOLLOW UP  Pharmacy Consult for vancomycin Indication: pneumonia  Labs:  Recent Labs  04/23/13 2043  04/24/13 0315  04/24/13 2043 04/25/13 0215 04/26/13 0259  WBC 22.5*  --  18.0*  --   --  13.6*  --   HGB 14.7  --  11.9*  --   --  11.3*  --   PLT 259  --  197  --   --  175  --   CREATININE 0.82  < >  --   < > 0.71 0.63 0.62  < > = values in this interval not displayed. Estimated Creatinine Clearance: 80.4 ml/min (by C-G formula based on Cr of 0.62).  Recent Labs  04/26/13 0259  VANCOTROUGH 13.6     Microbiology: Recent Results (from the past 720 hour(s))  CULTURE, BLOOD (ROUTINE X 2)     Status: None   Collection Time    04/23/13 11:38 PM      Result Value Ref Range Status   Specimen Description BLOOD LEFT FOREARM   Final   Special Requests BOTTLES DRAWN AEROBIC AND ANAEROBIC 10CC   Final   Culture  Setup Time     Final   Value: 04/24/2013 08:30     Performed at [facility]   Culture     Final   Value:        BLOOD CULTURE RECEIVED NO GROWTH TO DATE CULTURE WILL BE HELD FOR 5 DAYS BEFORE ISSUING A FINAL NEGATIVE REPORT     Performed at [facility]   Report Status PENDING   Incomplete  CULTURE, BLOOD (ROUTINE X 2)     Status: None   Collection Time    04/23/13 11:44 PM      Result Value Ref Range Status   Specimen Description BLOOD LEFT FOREARM   Final   Special Requests BOTTLES DRAWN AEROBIC AND ANAEROBIC 10CC   Final   Culture  Setup Time     Final   Value: 04/24/2013 08:30     Performed at [facility]   Culture     Final   Value:        BLOOD CULTURE RECEIVED NO GROWTH TO DATE CULTURE WILL BE HELD FOR 5 DAYS BEFORE ISSUING A FINAL NEGATIVE REPORT     Performed at [facility]   Report Status PENDING   Incomplete  MRSA PCR SCREENING     Status: None   Collection Time    04/24/13  1:25 AM      Result Value Ref Range Status   MRSA by PCR NEGATIVE  NEGATIVE Final   Comment:            The  GeneXpert MRSA Assay (FDA     approved for NASAL specimens     only), is one component of a     comprehensive MRSA colonization     surveillance program. It is not     intended to diagnose MRSA     infection nor to guide or     monitor treatment for     MRSA infections.     Assessment: 48yo male subtherapeutic on vancomycin with initial dosing for PNA w/ lab drawn just 8hr after dose given and calculated trough closer to 8; of note PNA is suspected to be related to aspiration during Sz, plan to d/c vanc soon unless Cx reveal MRSA, though NGTD.  Goal of Therapy:  Vancomycin trough level 15-20 mcg/ml  Plan:  Will change vancomycin to 1000mg  IV Q8H for trough ~18 and continue to monitor.  Wynona Neat, PharmD, BCPS  04/26/2013,5:03 AM

## 2013-04-26 NOTE — Progress Notes (Signed)
Pt having increased blood pressures. MD notified and aware.

## 2013-04-26 NOTE — Progress Notes (Signed)
Re-checked pt Bp an hr after giving Ativan. Bp remained elevated. Given PRN Hydralazine per order. Will continue to monitor.

## 2013-04-26 NOTE — Progress Notes (Signed)
PULMONARY / CRITICAL CARE MEDICINE   Name: Joseph Underwood MRN: [id number] DOB: [birth year]    ADMISSION DATE:  04/23/2013 CONSULTATION DATE:  04/25/13  REFERRING MD :  Dr Murlean Caller PRIMARY SERVICE: IMTS  CHIEF COMPLAINT:  EtOH withdrawal delirium  BRIEF PATIENT DESCRIPTION: 48 yo man ETOH abuse, seizures. Admitted with withdrawal and RLL PNA. To ICU 3/12 for precedex.   SIGNIFICANT EVENTS / STUDIES:  TTE 07/28/11 >> normal LV cavity size and fxn, LVH, normal RV  LINES / TUBES:  CULTURES: Blood 3/10 >> resp 3/11 >> not collected  ANTIBIOTICS: Pip/tazo 3/11 >>  vanco 3/11 >>   HISTORY OF PRESENT ILLNESS:  48 yo man with EtOH abuse, active drinker, tobacco use, hx seizure d/o. Brought to the ED 3/10 after a seizure. CXR showed RLL infiltrate. He was treated for CAP and then changed to vanco + pip/tazo. He has experienced increasing agitation, delirium and ativan needs. Moved to ICU to initiate precedex 3/12.   SUBJECTIVE: No issues overnight, mental status is well.  VITAL SIGNS: Temp:  [97.3 F (36.3 C)-98.7 F (37.1 C)] 97.3 F (36.3 C) (03/13 0749) Pulse Rate:  [81-123] 82 (03/13 0600) Resp:  [17-36] 22 (03/13 0600) BP: (119-141)/(79-100) 130/82 mmHg (03/13 0600) SpO2:  [93 %-100 %] 96 % (03/13 0600) HEMODYNAMICS:   VENTILATOR SETTINGS:   INTAKE / OUTPUT: Intake/Output     03/12 0701 - 03/13 0700 03/13 0701 - 03/14 0700   P.O. 240    I.V. (mL/kg) 1725 (34.6)    IV Piggyback 100    Total Intake(mL/kg) 2065 (41.5)    Urine (mL/kg/hr) 2175 (1.8)    Total Output 2175     Net -110.1          Stool Occurrence 6 x      PHYSICAL EXAMINATION: General: AAO x3, follows all commands. Neuro: Moves all ext to command, minimally tremulous. HEENT: PERRL, EOM-I and MMM, eating. Cardiovascular: RRR, Nl S1/S2, -M/R/G. Lungs: RLL crackles but otherwise clear. Abdomen:  Soft benign. Musculoskeletal:  No deformities. Skin:  No rash.  LABS:  CBC  Recent Labs Lab  04/23/13 2043 04/24/13 0315 04/25/13 0215  WBC 22.5* 18.0* 13.6*  HGB 14.7 11.9* 11.3*  HCT 39.4 32.7* 31.3*  PLT 259 197 175   Coag's No results found for this basename: APTT, INR,  in the last 168 hours BMET  Recent Labs Lab 04/24/13 2043 04/25/13 0215 04/26/13 0259  NA 138 135* 140  K 2.9* 3.3* 3.3*  CL 92* 94* 101  CO2 28 26 23   BUN 5* 4* 4*  CREATININE 0.71 0.63 0.62  GLUCOSE 83 101* 114*   Electrolytes  Recent Labs Lab 04/24/13 0242  04/24/13 2043 04/25/13 0215 04/26/13 0259  CALCIUM 7.6*  < > 8.4 8.0* 8.6  MG 0.9*  --  2.0  --   --   < > = values in this interval not displayed. Sepsis Markers  Recent Labs Lab 04/23/13 2042 04/23/13 2051 04/24/13 0242  LATICACIDVEN 7.3* 8.76* 1.2   ABG No results found for this basename: PHART, PCO2ART, PO2ART,  in the last 168 hours Liver Enzymes  Recent Labs Lab 04/23/13 2043 04/25/13 0215  AST 1094* 157*  ALT 213* 77*  ALKPHOS 135* 95  BILITOT 0.7 0.6  ALBUMIN 3.8 3.0*   Cardiac Enzymes  Recent Labs Lab 04/23/13 2133  TROPONINI <0.30   Glucose No results found for this basename: GLUCAP,  in the last 168 hours  Imaging No results found. CXR:  3/10 > RLL alveolar infiltrate without consolidation, reviewed by me.   ASSESSMENT / PLAN:  PULMONARY A: Hypoxemia, possible OSA RLL PNA P:   - Abx as below - Titrate O2 for sats. - Hold precedex. - IS per RT.  CARDIOVASCULAR A: No acute issues P:  - Telemetry  RENAL A:  Hypokalemia P:   - Replaced - Follow BMP  GASTROINTESTINAL A:  No issues P:   - GI prophylaxis - Start diet.  HEMATOLOGIC A:  leukocytosis P:  - Follow CBC  INFECTIOUS A:  RLL PNA, suspect aspiration PNA due to seizure P:   - Agree with coverage for anaerobes with probable aspiration - Can likely d/c vanco soon unless cx's reveal MRSA, currently pending, if negative then would change abx to unasyn.  NEUROLOGIC A:  EtOH withdrawal delirium Seizure  disorder P:   - Dilantin as ordered - D/C precedex. - Schedule lower dose ativan and use ativan prn as well.  - Thiamine + folate  TODAY'S SUMMARY: Hold precedex, observe til noon, if remains stable will transfer out of the ICU and back to IMTS.  I have personally obtained a history, examined the patient, evaluated laboratory and imaging results, formulated the assessment and plan and placed orders.  Rush Farmer, M.D. [facility] Pulmonary/Critical Care Medicine. Pager: [phone]. After hours pager: [phone].

## 2013-04-26 NOTE — Progress Notes (Signed)
I have seen the patient and reviewed the daily progress note by Willaim Rayas MS IV and discussed the care of the patient with them.  See below for documentation of my findings, assessment, and plans.  Transfer Summary: [name] is a 48 year old man with PMH of alcohol withdrawal seizures, and questionable history of baseline seizure disorder (noncompliance to Dilantin), who was admitted to the IMTS service on 3/11 for alcohol withdrawal seizure complicated by right lower lobe pneumonia. Initial alcohol withdrawal symptoms included sweting, blurry vision, tachycardia, and tremors after 24 hours of last alcoholic drink. He was placed on CIWA protocol with Ativan given as needed. He was initially septic with a lactic acidosis, elevated anion gap, and elevated CK that improved with IV fluid resuscitation. On 3/12, he was less somnolent on exam and reported that he was feeling better. Unfortunately, later that afternoon he became increasingly agitated and combative despite receiving increased doses of Ativan per CIWA score scale. He attempted to leave AMA and became aggressive towards his nurse with CIWA score in the 20s. He was given IM Haldol with no improvement. He was promptly transferred the ICU and started on a Precedex drip for his alcohol withdrawal. He was continued on the Precedex drip in the ICU until noon on 3/13 when it was discontinued. At that time he remained stable, and was no longer combative with CIWA scores of 1-3. He was transferred back to the a Telemetry bed on 3/13 and IMTS would resume his care.     Subjective: Improvement in his behavior but still with intermittent tachycardia an tremors, CIWA of 11 after transfer out of ICU.   Objective: Vital signs in last 24 hours: Filed Vitals:   04/26/13 1648 04/26/13 1806 04/26/13 1810 04/26/13 2000  BP: 162/105 182/113 185/110 175/113  Pulse:  119  118  Temp:  99 F (37.2 C)  98.6 F (37 C)  TempSrc:  Oral  Oral  Resp:  20  20   Height:      Weight:      SpO2:  95%  98%   Weight change:   Intake/Output Summary (Last 24 hours) at 04/26/13 2033 Last data filed at 04/26/13 1400  Gross per 24 hour  Intake 2298.13 ml  Output   1100 ml  Net 1198.13 ml   General appearance: alert, cooperative and no distress  Head: Normocephalic, without obvious abnormality, atraumatic  HEENT: MMM  Lungs: RLL crackles, no respiratory distress   Heart: regular rate and rhythm, S1, S2 normal, no murmur, click, rub or gallop  Abdomn: soft, non-tender; bowel sounds normal; no masses, no organomegaly  Extremities: extremities normal, atraumatic, no cyanosis or edema  Neurologic: alert and oriented X 3,moves all extremities voluntarily, follows commands appropriately  Lab Results: Reviewed and documented in Electronic Record Micro Results: Reviewed and documented in Electronic Record Studies/Results: Reviewed and documented in Electronic Record Medications: I have reviewed the patient's current medications. Scheduled Meds: . amLODipine  5 mg Oral Daily  . amoxicillin-clavulanate  1 tablet Oral Q12H  . enoxaparin (LOVENOX) injection  40 mg Subcutaneous Q24H  . folic acid  1 mg Oral Daily  . LORazepam  1 mg Intravenous 3 times per day  . multivitamin with minerals  1 tablet Oral Daily  . phenytoin (DILANTIN) IV  100 mg Intravenous 3 times per day  . piperacillin-tazobactam (ZOSYN)  IV  3.375 g Intravenous Q8H  . pneumococcal 23 valent vaccine  0.5 mL Intramuscular Tomorrow-1000  .  potassium chloride  40 mEq Oral TID  . thiamine  100 mg Oral Daily  . vancomycin  1,000 mg Intravenous Q8H   Continuous Infusions: . dexmedetomidine 0.6 mcg/kg/hr (04/26/13 0626)   PRN Meds:.hydrALAZINE, LORazepam Assessment/Plan: 48 year old male with a PMH of alcohol dependence and withdrawal seizures, who is being treated for alcohol withdrawal along with sepsis secondary to a likely aspiration pneumonia.   1. Sepsis 2/2 to pneumonia: He  reports dysphagia with feeling that food has gone down "the wrong way" prior to his presentation. He denies cough, URI symptoms, chest pain or dyspnea, however, he has leukocytosis and RLL infiltrate in the setting of possible seizure concerning for aspiration pneumonia. Urine Legionella and strep pneumo negative. WBC downtrending to 13.6 as of 3/12. HIV non reactive. Remains afebrile. Passed SLP swallow evaluation and has a regular diet. Blood culture NGTD.  - Appreciate PCCM help managing this patient.  - Continue Vanc and Zosyn - Would like to narrow therapy to Unasyn given NGTD BCs but Pharmacy reports a shortage. Will start PO Augmentin tomorrow.   2. Seizure 2/2 to EtOH withdrawal: Unclear if he had a seizure prior to his presentation. Reports multiple episodes lately. He says he last drink was on the night prior to admission but EtOH level undetectable. Also, non-compliant with Dilantin. Dilantin loaded in ED. CK level elevated on admission to 483 with UA moderate Hgb and 3-6 RBC but CK has trended down to 237, not as concerning for rhabdomyolisis.  AST>ALT by 2:1 ratio.  - Continue Dilantin 100mg  TID, IV for now but may need to adjust dose  - ICU CIWA protocol  - D5-NS 170mL/hr  - Uninterested in talking to CSW or exploring rehab options at this time   3. Increased AG metabolic acidosis: Likely combination of alcoholic ketoacidosis, starvation ketoacidosis and lactic acidosis. Lactic acid elevated to 8.76 initially, trended down to 1.2 with IVF. 40mEq of KCl given overnight and this morning. Mg normalized after 4g repletion. .  - Continue D5-NS  - Treating for pneumonia as above  - Trending BMP   4. Hypokalemia - Improving. K of 3.3 this morning.  Repleted with Kdur 41mEq BID.  -Recheck BMET in am.   5. Hypomagnesia - Resolved. Likely 2/2 to alcohol abuse. Mg of 0.9 yesterday, repleted with Magnesium 4 g, level normal to 2.0.    6. HTN--on HCTZ 25mg  at home. BP elevated today, likely 2/2  to [facility] withdrawal.  - Holding home HCTZ given dehydrated state  - Will start Norvasc 5mg  daily  - BP still high - added Hydralazine 2mg  for SBP > 180  - CIWA with Ativan PRN  7. Hypertriglyceridemia--Tg 1444 on admission with Cholesterol 395. Initial lab results noted to be hemolyzed in setting of Lipemic specimen. Need to work on CV risk reduction and prevention of possible pancreatitis (especially given alcohol dependence). Statin therapy will likely be appropriate, however currently CK elevated.  - Continues to deny any abdominal pain with no signs of pancreatitis.   8. Tobacco dependence--Smokes ~1/2ppd along with alcohol. Cessation strongly recommended. Does not want nicotine patch.   Dispo: Disposition is deferred at this time, awaiting improvement of current medical problems.  Anticipated discharge in approximately 1-2 day(s).   The patient does have a current PCP Olga Millers, MD) and does need an [facility] hospital follow-up appointment after discharge.  The patient does not have transportation limitations that hinder transportation to clinic appointments.  .Services Needed at time of discharge: Y =  Yes, Blank = No PT:   OT:   RN:   Equipment:   Other:     LOS: 3 days   Blain Pais, MD 04/26/2013, 8:33 PM

## 2013-04-27 ENCOUNTER — Emergency Department (HOSPITAL_COMMUNITY): Payer: Self-pay

## 2013-04-27 ENCOUNTER — Emergency Department (HOSPITAL_COMMUNITY)
Admission: EM | Admit: 2013-04-27 | Discharge: 2013-04-27 | Disposition: A | Discharge status: Home / Self Care | Payer: MEDICAID | Attending: Emergency Medicine | Admitting: Emergency Medicine

## 2013-04-27 ENCOUNTER — Encounter (HOSPITAL_COMMUNITY): Payer: Self-pay | Admitting: Emergency Medicine

## 2013-04-27 DIAGNOSIS — F10939 Alcohol use, unspecified with withdrawal, unspecified: Secondary | ICD-10-CM

## 2013-04-27 DIAGNOSIS — F10929 Alcohol use, unspecified with intoxication, unspecified: Secondary | ICD-10-CM

## 2013-04-27 DIAGNOSIS — Z8701 Personal history of pneumonia (recurrent): Secondary | ICD-10-CM | POA: Insufficient documentation

## 2013-04-27 DIAGNOSIS — Z792 Long term (current) use of antibiotics: Secondary | ICD-10-CM | POA: Insufficient documentation

## 2013-04-27 DIAGNOSIS — Z79899 Other long term (current) drug therapy: Secondary | ICD-10-CM | POA: Insufficient documentation

## 2013-04-27 DIAGNOSIS — F1010 Alcohol abuse, uncomplicated: Secondary | ICD-10-CM | POA: Insufficient documentation

## 2013-04-27 DIAGNOSIS — F17200 Nicotine dependence, unspecified, uncomplicated: Secondary | ICD-10-CM | POA: Insufficient documentation

## 2013-04-27 DIAGNOSIS — I10 Essential (primary) hypertension: Secondary | ICD-10-CM

## 2013-04-27 DIAGNOSIS — J690 Pneumonitis due to inhalation of food and vomit: Secondary | ICD-10-CM

## 2013-04-27 DIAGNOSIS — R000 Tachycardia, unspecified: Secondary | ICD-10-CM | POA: Insufficient documentation

## 2013-04-27 DIAGNOSIS — G40909 Epilepsy, unspecified, not intractable, without status epilepticus: Secondary | ICD-10-CM | POA: Insufficient documentation

## 2013-04-27 DIAGNOSIS — F10239 Alcohol dependence with withdrawal, unspecified: Secondary | ICD-10-CM

## 2013-04-27 LAB — COMPREHENSIVE METABOLIC PANEL
ALT: 53 U/L (ref 0–53)
AST: 55 U/L — AB (ref 0–37)
Albumin: 3.3 g/dL — ABNORMAL LOW (ref 3.5–5.2)
Alkaline Phosphatase: 93 U/L (ref 39–117)
BILIRUBIN TOTAL: 0.3 mg/dL (ref 0.3–1.2)
BUN: 11 mg/dL (ref 6–23)
CO2: 20 meq/L (ref 19–32)
Calcium: 9.7 mg/dL (ref 8.4–10.5)
Chloride: 100 mEq/L (ref 96–112)
Creatinine, Ser: 0.96 mg/dL (ref 0.50–1.35)
GFR calc Af Amer: >90 mL/min (ref >90)
Glucose, Bld: 87 mg/dL (ref 70–99)
Potassium: 3.5 mEq/L — ABNORMAL LOW (ref 3.7–5.3)
SODIUM: 140 meq/L (ref 137–147)
Total Protein: 7.2 g/dL (ref 6.0–8.3)

## 2013-04-27 LAB — CBC WITH DIFFERENTIAL/PLATELET
BASOS ABS: 0.1 10*3/uL (ref 0.0–0.1)
Basophils Relative: 0 % (ref 0–1)
Eosinophils Absolute: 0.1 10*3/uL (ref 0.0–0.7)
Eosinophils Relative: 0 % (ref 0–5)
HCT: 33.4 % — ABNORMAL LOW (ref 39.0–52.0)
Hemoglobin: 12 g/dL — ABNORMAL LOW (ref 13.0–17.0)
LYMPHS ABS: 2 10*3/uL (ref 0.7–4.0)
Lymphocytes Relative: 18 % (ref 12–46)
MCH: 35.2 pg — ABNORMAL HIGH (ref 26.0–34.0)
MCHC: 35.9 g/dL (ref 30.0–36.0)
MCV: 97.9 fL (ref 78.0–100.0)
Monocytes Absolute: 0.9 10*3/uL (ref 0.1–1.0)
Monocytes Relative: 8 % (ref 3–12)
NEUTROS ABS: 8.2 10*3/uL — AB (ref 1.7–7.7)
Neutrophils Relative %: 73 % (ref 43–77)
PLATELETS: 172 10*3/uL (ref 150–400)
RBC: 3.41 MIL/uL — AB (ref 4.22–5.81)
RDW: 14 % (ref 11.5–15.5)
WBC: 11.2 10*3/uL — ABNORMAL HIGH (ref 4.0–10.5)

## 2013-04-27 LAB — PHOSPHORUS: Phosphorus: 3.6 mg/dL (ref 2.3–4.6)

## 2013-04-27 LAB — CBC
HCT: 37.2 % — ABNORMAL LOW (ref 39.0–52.0)
Hemoglobin: 13.6 g/dL (ref 13.0–17.0)
MCH: 35.6 pg — AB (ref 26.0–34.0)
MCHC: 36.6 g/dL — AB (ref 30.0–36.0)
MCV: 97.4 fL (ref 78.0–100.0)
PLATELETS: 179 10*3/uL (ref 150–400)
RBC: 3.82 MIL/uL — ABNORMAL LOW (ref 4.22–5.81)
RDW: 13.8 % (ref 11.5–15.5)
WBC: 11.8 10*3/uL — AB (ref 4.0–10.5)

## 2013-04-27 LAB — BASIC METABOLIC PANEL
BUN: 7 mg/dL (ref 6–23)
CO2: 25 meq/L (ref 19–32)
CREATININE: 0.71 mg/dL (ref 0.50–1.35)
Calcium: 10.1 mg/dL (ref 8.4–10.5)
Chloride: 96 mEq/L (ref 96–112)
GFR calc Af Amer: >90 mL/min (ref >90)
GFR calc non Af Amer: >90 mL/min (ref >90)
Glucose, Bld: 88 mg/dL (ref 70–99)
Potassium: 4 mEq/L (ref 3.7–5.3)
Sodium: 138 mEq/L (ref 137–147)

## 2013-04-27 LAB — ETHANOL: Alcohol, Ethyl (B): 82 mg/dL — ABNORMAL HIGH (ref 0–11)

## 2013-04-27 LAB — PHENYTOIN LEVEL, TOTAL: Phenytoin Lvl: 6.5 ug/mL — ABNORMAL LOW (ref 10.0–20.0)

## 2013-04-27 LAB — MAGNESIUM: Magnesium: 1 mg/dL — ABNORMAL LOW (ref 1.5–2.5)

## 2013-04-27 MED ORDER — FOLIC ACID 1 MG PO TABS
1.0000 mg | ORAL_TABLET | Freq: Every day | ORAL | Status: DC
  Administered 2013-04-27: 1 mg via ORAL
  Filled 2013-04-27: qty 1

## 2013-04-27 MED ORDER — LORAZEPAM 2 MG/ML IJ SOLN: 0.0000 mg | Freq: Two times a day (BID) | INTRAMUSCULAR | Status: DC

## 2013-04-27 MED ORDER — ADULT MULTIVITAMIN W/MINERALS CH
1.0000 | ORAL_TABLET | Freq: Every day | ORAL | Status: DC
Start: 2013-04-27 — End: 2013-04-27
  Administered 2013-04-27: 1 via ORAL
  Filled 2013-04-27: qty 1

## 2013-04-27 MED ORDER — LORAZEPAM 2 MG/ML IJ SOLN
4.0000 mg | Freq: Once | INTRAMUSCULAR | Status: AC
  Administered 2013-04-27: 2 mg via INTRAVENOUS
  Filled 2013-04-27: qty 2

## 2013-04-27 MED ORDER — THIAMINE HCL 100 MG/ML IJ SOLN: 100.0000 mg | Freq: Every day | INTRAMUSCULAR | Status: DC

## 2013-04-27 MED ORDER — AMLODIPINE BESYLATE 10 MG PO TABS: 10.0000 mg | ORAL_TABLET | Freq: Every day | ORAL | Status: DC

## 2013-04-27 MED ORDER — LORAZEPAM 2 MG/ML IJ SOLN
0.0000 mg | Freq: Four times a day (QID) | INTRAMUSCULAR | Status: DC
  Administered 2013-04-27: 2 mg via INTRAVENOUS
  Filled 2013-04-27: qty 1

## 2013-04-27 MED ORDER — THIAMINE HCL 100 MG PO TABS: 100.0000 mg | ORAL_TABLET | Freq: Every day | ORAL | Status: DC

## 2013-04-27 MED ORDER — VITAMIN B-1 100 MG PO TABS
100.0000 mg | ORAL_TABLET | Freq: Every day | ORAL | Status: DC
  Administered 2013-04-27: 100 mg via ORAL
  Filled 2013-04-27: qty 1

## 2013-04-27 MED ORDER — AMOXICILLIN-POT CLAVULANATE 875-125 MG PO TABS: 1.0000 | ORAL_TABLET | Freq: Two times a day (BID) | ORAL | Status: DC

## 2013-04-27 MED ORDER — SODIUM CHLORIDE 0.9 % IV SOLN
INTRAVENOUS | Status: DC
  Administered 2013-04-27: 19:00:00 via INTRAVENOUS

## 2013-04-27 MED ORDER — LORAZEPAM 2 MG/ML IJ SOLN: 1.0000 mg | Freq: Four times a day (QID) | INTRAMUSCULAR | Status: DC | PRN

## 2013-04-27 MED ORDER — POTASSIUM CHLORIDE CRYS ER 20 MEQ PO TBCR
40.0000 meq | EXTENDED_RELEASE_TABLET | Freq: Two times a day (BID) | ORAL | Status: AC
  Administered 2013-04-27 (×2): 40 meq via ORAL
  Filled 2013-04-27 (×2): qty 2

## 2013-04-27 MED ORDER — FOLIC ACID 1 MG PO TABS: 1.0000 mg | ORAL_TABLET | Freq: Every day | ORAL | Status: DC

## 2013-04-27 MED ORDER — LORAZEPAM 1 MG PO TABS: 1.0000 mg | ORAL_TABLET | Freq: Four times a day (QID) | ORAL | Status: DC | PRN

## 2013-04-27 NOTE — Progress Notes (Addendum)
Internal Medicine On-Call Attending  Date: 04/27/2013  Patient name: Joseph Underwood Medical record number: [id number] Date of birth: 1965/12/30 Age: 48 y.o. Gender: male  I saw and evaluated the patient with resident physician Dr. Hayes Ludwig and Randa Evens. I reviewed the resident's note by Dr. Hayes Ludwig and I agree with the resident's findings and plans as documented in her note.  We were called to see patient because he wants to leave the hospital against medical advice.  He has no acute complaints; he is mildly tachycardic but physical exam is otherwise unremarkable.  Patient is alert and completely oriented.  He understands the reason he was hospitalized and the nature of his medical problems.  He understands the risk of leaving the hospital today, and stated that he would return to the emergency department if he developed problems after leaving the hospital.  He said that he would followup in the clinic next week and that he would take the prescribed oral antibiotic for his pneumonia and the Dilantin to prevent seizures; prescriptions were provided by Dr. Hayes Ludwig.

## 2013-04-27 NOTE — Progress Notes (Signed)
I was paged to bedside by nurse. The patient wanted to leave AMA. I recommended that the patient remain in the hospital for further treatment. I administered 2 mg ativan IV and ordered the CIWA protocol.  The patient has a normal mental status, and understands the risks of leaving, including permanent disability and/or death, and has had an opportunity to ask questions about his medical condition. The patient has been informed that he may return for care at any time. The patient agreed to stay for the next 15-20 minutes so that the primary team can speak with the patient. It is possible that the patient will leave AMA prior to the primary team seeing the patient.   Lum Babe, MD PGY1

## 2013-04-27 NOTE — ED Provider Notes (Signed)
CSN: [id number]     Arrival date & time 04/27/13  1744 History   First MD Initiated Contact with Patient 04/27/13 1805     Chief Complaint  Patient presents with  . Illegal value: [    trembling     (Consider location/radiation/quality/duration/timing/severity/associated sxs/prior Treatment) HPI Comments: Patient presents to the ER for evaluation of weakness and trembling. Patient reports that he was just discharged from the hospital this morning. He was hospitalized for recurrent seizures as well as pneumonia. His doctors apparently wanted him to stay in the hospital today, but he wanted to be discharged. After he got home, however, he reports that he started having shaking all over. He was awake and alert, he did not feel like a seizure. He was having trouble walking because of generalized weakness and trembling. He does not think he had a fever.   Past Medical History  Diagnosis Date  . Seizures   . Alcohol abuse   . Hiccups   . HTN (hypertension)    Past Surgical History  Procedure Laterality Date  . No past surgeries     No family history on file. History  Substance Use Topics  . Smoking status: Current Every Day Smoker -- 0.50 packs/day for 25 years    Types: Cigarettes  . Smokeless tobacco: Never Used  . Alcohol Use: Yes     Comment: a fifth every other day or 4-5 beers per day    Review of Systems  Neurological: Positive for tremors.  All other systems reviewed and are negative.      Allergies  Review of patient's allergies indicates no known allergies.  Home Medications   Current Outpatient Rx  Name  Route  Sig  Dispense  Refill  . amLODipine (NORVASC) 10 MG tablet   Oral   Take 1 tablet (10 mg total) by mouth daily.   30 tablet   2   . amoxicillin-clavulanate (AUGMENTIN) 875-125 MG per tablet   Oral   Take 1 tablet by mouth every 12 (twelve) hours.   14 tablet   0   . folic acid (FOLVITE) 1 MG tablet   Oral   Take 1 tablet (1 mg total) by  mouth daily.   30 tablet   2   . phenytoin (DILANTIN) 100 MG ER capsule   Oral   Take 1 capsule (100 mg total) by mouth 3 (three) times daily.   90 capsule   0   . thiamine 100 MG tablet   Oral   Take 1 tablet (100 mg total) by mouth daily.   30 tablet   2    BP 130/74  Pulse 147  Temp(Src) 98.3 F (36.8 C) (Oral)  Resp 20  SpO2 97% Physical Exam  Constitutional: He is oriented to person, place, and time. He appears well-developed and well-nourished. No distress.  HENT:  Head: Normocephalic and atraumatic.  Right Ear: Hearing normal.  Left Ear: Hearing normal.  Nose: Nose normal.  Mouth/Throat: Oropharynx is clear and moist and mucous membranes are normal.  Eyes: Conjunctivae and EOM are normal. Pupils are equal, round, and reactive to light.  Neck: Normal range of motion. Neck supple.  Cardiovascular: Regular rhythm, S1 normal and S2 normal.  Tachycardia present.  Exam reveals no gallop and no friction rub.   No murmur heard. Pulmonary/Chest: Effort normal and breath sounds normal. No respiratory distress. He exhibits no tenderness.  Abdominal: Soft. Normal appearance and bowel sounds are normal. There is no hepatosplenomegaly. There  is no tenderness. There is no rebound, no guarding, no tenderness at McBurney's point and negative Murphy's sign. No hernia.  Musculoskeletal: Normal range of motion.  Neurological: He is alert and oriented to person, place, and time. He has normal strength. No cranial nerve deficit or sensory deficit. Coordination normal. GCS eye subscore is 4. GCS verbal subscore is 5. GCS motor subscore is 6.  Skin: Skin is warm, dry and intact. No rash noted. No cyanosis.  Psychiatric: He has a normal mood and affect. His speech is normal and behavior is normal. Thought content normal.    ED Course  Procedures (including critical care time) Labs Review Labs Reviewed  CBC WITH DIFFERENTIAL  COMPREHENSIVE METABOLIC PANEL  URINALYSIS, ROUTINE W REFLEX  MICROSCOPIC  URINE RAPID DRUG SCREEN (HOSP PERFORMED)  PHENYTOIN LEVEL, TOTAL  ETHANOL   Imaging Review No results found.   EKG Interpretation   Date/Time:  Saturday April 27 2013 17:59:21 EDT Ventricular Rate:  143 PR Interval:  136 QRS Duration: 76 QT Interval:  272 QTC Calculation: 419 R Axis:   87 Text Interpretation:  Sinus tachycardia Right atrial enlargement Minimal  voltage criteria for LVH, may be normal variant Lateral infarct , age  undetermined Abnormal ECG No significant change since last tracing  Confirmed by POLLINA  MD, [place] [phone]) on 04/27/2013 6:14:10 PM      MDM   Final diagnoses:  Alcohol intoxication    Presented with trembling and dizziness. Just left the hospital today after treatment for increased seizures and pneumonia. Has been drinking today, likely causal in the patient's symptoms. His pneumonia is improved. He has not had a seizure. He has declined help with his alcoholism. Will be discharged, follow up with his physician, continue current meds including dilantin. Return if he needs help with alcoholism.    Orpah Greek, MD 04/29/13 [phone]

## 2013-04-27 NOTE — Discharge Instructions (Signed)
Take your Dilantin as scheduled, including when you get home tonight. Followup with your doctor as needed. Return to the ER at any time if you feel like you need help with alcohol.  Alcohol Problems Most adults who drink alcohol drink in moderation (not a lot) are at low risk for developing problems related to their drinking. However, all drinkers, including low-risk drinkers, should know about the health risks connected with drinking alcohol. RECOMMENDATIONS FOR LOW-RISK DRINKING  Drink in moderation. Moderate drinking is defined as follows:   Men - no more than 2 drinks per day.  Nonpregnant women - no more than 1 drink per day.  Over age 39 - no more than 1 drink per day. A standard drink is 12 grams of pure alcohol, which is equal to a 12 ounce bottle of beer or wine cooler, a 5 ounce glass of wine, or 1.5 ounces of distilled spirits (such as whiskey, brandy, vodka, or rum).  ABSTAIN FROM (DO NOT DRINK) ALCOHOL:  When pregnant or considering pregnancy.  When taking a medication that interacts with alcohol.  If you are alcohol dependent.  A medical condition that prohibits drinking alcohol (such as ulcer, liver disease, or heart disease). DISCUSS WITH YOUR CAREGIVER:  If you are at risk for coronary heart disease, discuss the potential benefits and risks of alcohol use: Light to moderate drinking is associated with lower rates of coronary heart disease in certain populations (for example, men over age 15 and postmenopausal women). Infrequent or nondrinkers are advised not to begin light to moderate drinking to reduce the risk of coronary heart disease so as to avoid creating an alcohol-related problem. Similar protective effects can likely be gained through proper diet and exercise.  Women and the elderly have smaller amounts of body water than men. As a result women and the elderly achieve a higher blood alcohol concentration after drinking the same amount of alcohol.  Exposing a  fetus to alcohol can cause a broad range of birth defects referred to as Fetal Alcohol Syndrome (FAS) or Alcohol-Related Birth Defects (ARBD). Although FAS/ARBD is connected with excessive alcohol consumption during pregnancy, studies also have reported neurobehavioral problems in infants born to mothers reporting drinking an average of 1 drink per day during pregnancy.  Heavier drinking (the consumption of more than 4 drinks per occasion by men and more than 3 drinks per occasion by women) impairs learning (cognitive) and psychomotor functions and increases the risk of alcohol-related problems, including accidents and injuries. CAGE QUESTIONS:   Have you ever felt that you should Cut down on your drinking?  Have people Annoyed you by criticizing your drinking?  Have you ever felt bad or Guilty about your drinking?  Have you ever had a drink first thing in the morning to steady your nerves or get rid of a hangover (Eye opener)? If you answered positively to any of these questions: You may be at risk for alcohol-related problems if alcohol consumption is:   Men: Greater than 14 drinks per week or more than 4 drinks per occasion.  Women: Greater than 7 drinks per week or more than 3 drinks per occasion. Do you or your family have a medical history of alcohol-related problems, such as:  Blackouts.  Sexual dysfunction.  Depression.  Trauma.  Liver dysfunction.  Sleep disorders.  Hypertension.  Chronic abdominal pain.  Has your drinking ever caused you problems, such as problems with your family, problems with your work (or school) performance, or accidents/injuries?  Do you  have a compulsion to drink or a preoccupation with drinking?  Do you have poor control or are you unable to stop drinking once you have started?  Do you have to drink to avoid withdrawal symptoms?  Do you have problems with withdrawal such as tremors, nausea, sweats, or mood disturbances?  Does it take  more alcohol than in the past to get you high?  Do you feel a strong urge to drink?  Do you change your plans so that you can have a drink?  Do you ever drink in the morning to relieve the shakes or a hangover? If you have answered a number of the previous questions positively, it may be time for you to talk to your caregivers, family, and friends and see if they think you have a problem. Alcoholism is a chemical dependency that keeps getting worse and will eventually destroy your health and relationships. Many alcoholics end up dead, impoverished, or in prison. This is often the end result of all chemical dependency.  Do not be discouraged if you are not ready to take action immediately.  Decisions to change behavior often involve up and down desires to change and feeling like you cannot decide.  Try to think more seriously about your drinking behavior.  Think of the reasons to quit. WHERE TO GO FOR ADDITIONAL INFORMATION   [employer] [place] on Alcohol Abuse and Alcoholism (NIAAA) http://www.bradshaw.com/  [employer] on Alcoholism and Drug Dependence (NCADD) www.ncadd.[place] (ASAM) http://carpenter.net/  Document Released: 01/31/2005 Document Revised: 04/25/2011 Document Reviewed: 09/19/2007 [facility] Patient Information 2014 [place].

## 2013-04-27 NOTE — Discharge Summary (Signed)
Patient [facility] [place]  Name: Joseph Underwood MRN: [id number] DOB: 24-Oct-1965 48 y.o. PCP: Olga Millers, MD  Date of Admission: 04/23/2013  8:11 PM Date of Discharge: 04/27/2013 Attending Physician: No att. providers found  Discharge Diagnosis: Principal Problem:   Aspiration pneumonia Active Problems:   ALCOHOL DEPENDENCE   CONVULSIONS, SEIZURES, NOS   Essential hypertension, benign   Sepsis   High anion gap metabolic acidosis   Lactic acidosis   Hypertriglyceridemia   Elevated CK   Tobacco dependence   Unspecified protein-calorie malnutrition   Alcohol withdrawal delirium  Discharge Medications:   Medication List    STOP taking these medications       hydrochlorothiazide 25 MG tablet  Commonly known as:  HYDRODIURIL      TAKE these medications       amLODipine 10 MG tablet  Commonly known as:  NORVASC  Take 1 tablet (10 mg total) by mouth daily.     amoxicillin-clavulanate 875-125 MG per tablet  Commonly known as:  AUGMENTIN  Take 1 tablet by mouth every 12 (twelve) hours.     folic acid 1 MG tablet  Commonly known as:  FOLVITE  Take 1 tablet (1 mg total) by mouth daily.     phenytoin 100 MG ER capsule  Commonly known as:  DILANTIN  Take 1 capsule (100 mg total) by mouth 3 (three) times daily.     thiamine 100 MG tablet  Take 1 tablet (100 mg total) by mouth daily.        Disposition and follow-up:   Joseph Underwood left AMA from [facility] in Stable condition.  At the hospital follow up visit please address:  1.  Recheck BP. Assure compliance to his antibiotic for his aspiration pneumonia, and his Norvasc as well as discontinuation of his HCTZ --pt was hypovolemic on presentation.   2.  Labs / imaging needed at time of follow-up: Consider repeat CXR in 5 weeks   3.  Pending labs/ test needing follow-up: None  Follow-up Appointments: Patient to be called with [facility] appointment   Discharge Instructions: Pt instructed to  continue taking Augmentin 875-125 mg BID for 7 days and to start amlodipine 5 mg daily, folic acid 1mg  daily, and thiamine 100mg  daily. He agreed to continue taking dilantin 100mg  ER TID.  He also agreed to follow up at the clinic   Consultations:  PCCM  Procedures Performed:  Dg Chest 2 View  04/23/2013   CLINICAL DATA Body aches.  EXAM CHEST  2 VIEW  COMPARISON March 30, 2013.  FINDINGS The heart size and mediastinal contours are within normal limits. Left lung is clear. Alveolar opacity is noted in right lower lobe consistent with pneumonia. No pneumothorax or pleural effusion is noted. The visualized skeletal structures are unremarkable.  IMPRESSION Right lower lobe pneumonia.  SIGNATURE  Electronically Signed   By: Sabino Dick M.D.   On: 04/23/2013 22:33   Dg Chest 2 View  03/30/2013   CLINICAL DATA:  Chest pain  EXAM: CHEST  2 VIEW  COMPARISON:  07/27/2011  FINDINGS: The heart size and mediastinal contours are within normal limits. Both lungs are clear. The visualized skeletal structures are unremarkable. Minimal prominence of the central hilar vasculature is reidentified, which may indicate mild central vascular congestion.  IMPRESSION: No active cardiopulmonary disease.   Electronically Signed   By: Conchita Paris M.D.   On: 03/30/2013 14:48   Admission HPI:  [name] is a 48  year old male with a PMH of EtOH abuse and w/d seizures who presents after apparent seizure at home. History is provided by the patient who is a questionable historian. He was at home in his usual state of health when he began to sweat, experienced blurry vision, racing heart and tremors. He says his "dilantin was low." He denies CP/SOB, cough or cold symptoms, tongue biting or loss of bowel or bladder control. He also notes decreased po and non-bloody emesis yesterday. His last drink was a half of a pint of liquor on the day prior to admission. He says he drinks this amount of liquor about 5 days out of the week.  He says he has had w/d seizures in the past but does not think he had a seizure today. He denies hallucinations.  In the ED: T 97.68F, RR 17-21, SpO2 96% on RA, HR 122-140, BP 130-180/90-117; he received 3L NSS boluses, 1g Ceftriaxone, 1g Dilantin and 1mg  of Ativan  Hospital Course by problem list: 1. Seizure 2/2 to EtOH withdrawal: [name] is a 48 year old man with PMH of alcohol withdrawal seizures, and questionable history of baseline seizure disorder (noncompliance to Dilantin), who was admitted to the IMTS service on 3/11 for alcohol withdrawal seizure complicated by right lower lobe pneumonia. Initial alcohol withdrawal symptoms included sweting, blurry vision, tachycardia, and tremors after 24 hours of last alcoholic drink. He was placed on CIWA protocol with Ativan given as needed. He was initially septic with a lactic acidosis, elevated anion gap, and elevated CK that improved with IV fluid resuscitation. On 3/12, he was less somnolent on exam and reported that he was feeling better. Unfortunately, later that afternoon he became increasingly agitated and combative despite receiving increased doses of Ativan per CIWA score scale. He attempted to leave AMA and became aggressive towards his nurse with CIWA score in the 20s. He was given IM Haldol with no improvement. He was promptly transferred the ICU and started on a Precedex drip for his alcohol withdrawal. He was continued on the Precedex drip in the ICU until noon on 3/13 when it was discontinued. At that time he remained stable, and was no longer combative with CIWA scores of 1-3. He was transferred back to the a Telemetry bed on 3/13 and IMTS would resume his care.   Upon arrival back to the floor, he did not have any other agitation or hallucinations and was alert and oriented x3 but expressed the desire to leave AMA. The morning of 3/14 he stated that he wanted to leave the hospital. We counseled him on the danger of leaving AMA but he  stated he had "things to do at home." On our exam he was competant and had a normal mental status. He understands the reason he was hospitalized and the nature of his medical problems. He expressed understanding of the risks involved with leaving the hospital. He also said he understood that he should return to the ED if his symptoms get worse, and that he should follow up in clinic. He said he would take the Dilantin as prescribed. He was uninterested in talking to [place] or exploring rehab options at this time. He was provided prescriptions for his antibiotic, his Norvasc, thiamine and folate prior to his discharge.   2. Sepsis secondary to aspiration pneumonia: He reports dysphagia with feeling that food has gone down "the wrong way" prior to his presentation. He denies cough, URI symptoms, chest pain or dyspnea, however, on presentation  he had leukocytosis and RLL infiltrate which was concerning for aspiration in the in the setting of possible seizure. He was started on broad antibiotic coverage with Vancomycin and Zosyn and was placed on aspiration precaution. His urine Legionella and Strep pneumo was negative. His leucocytosis gradually trended down to 11.8 prior to his departure. HIV non reactive. He remained afebrile. He passed his Passed SLP swallow evaluation and tolerated a regular diet. Blood culture NGTD. He never reported any symptoms of shortness of breath. Before he left AMA, we prescribed him Augmentin PO x 7 days which he agreed to continue taking.    3. Hypertension: We held his home HCTZ while hospitalized given his dehydration. We advised him to stop taking this medication. We started him on Amlodipine 5mg  daily and treated him once with Hydralazine 2mg  for breakthrough HTN. His elevated BPs during his stay are likely related to withdrawal. He was provided a prescription for Amlodipine 5mg  daily and will need repeat BP check at the [facility]. His BP was elevated to 150/70 just prior to his discharge.    4. Increased AG metabolic acidosis: Likely combination of alcoholic ketoacidosis, starvation ketoacidosis and lactic acidosis. Lactic acid elevated, 8.76 in setting of sepsis. His lactic acid normalized after IVF.   5. Hypertriglyceridemia--Tg 1444 on admission with Cholesterol 395. All lab results noted to be hemolyzed in setting of Lipemic specimen. Need to work on CV risk reduction. He denied any symptoms of pancreatitis. Statin therapy will likely be appropriate in the long term, however CK still mildly elevated prior to his departure.    6. Tobacco dependence--smokes ~1/2ppd along with alcohol. Cessation strongly recommended. Patient refused nicotine patch.   Discharge Vitals:   BP 150/70  Pulse 116  Temp(Src) 98.8 F (37.1 C) (Oral)  Resp 20  Ht 5\' 5"  (1.651 m)  Wt 109 lb 12.6 oz (49.8 kg)  BMI 18.27 kg/m2  SpO2 98%  Discharge Labs:  Results for orders placed during the hospital encounter of 04/23/13 (from the past 24 hour(s))  BASIC METABOLIC PANEL     Status: None   Collection Time    04/27/13  6:53 AM      Result Value Ref Range   Sodium 138  137 - 147 mEq/L   Potassium 4.0  3.7 - 5.3 mEq/L   Chloride 96  96 - 112 mEq/L   CO2 25  19 - 32 mEq/L   Glucose, Bld 88  70 - 99 mg/dL   BUN 7  6 - 23 mg/dL   Creatinine, Ser 0.71  0.50 - 1.35 mg/dL   Calcium 10.1  8.4 - 10.5 mg/dL   GFR calc non Af Amer >90  >90 mL/min   GFR calc Af Amer >90  >90 mL/min  CBC     Status: Abnormal   Collection Time    04/27/13  6:53 AM      Result Value Ref Range   WBC 11.8 (*) 4.0 - 10.5 K/uL   RBC 3.82 (*) 4.22 - 5.81 MIL/uL   Hemoglobin 13.6  13.0 - 17.0 g/dL   HCT 37.2 (*) 39.0 - 52.0 %   MCV 97.4  78.0 - 100.0 fL   MCH 35.6 (*) 26.0 - 34.0 pg   MCHC 36.6 (*) 30.0 - 36.0 g/dL   RDW 13.8  11.5 - 15.5 %   Platelets 179  150 - 400 K/uL  MAGNESIUM     Status: Abnormal   Collection Time    04/27/13  6:53 AM  Result Value Ref Range   Magnesium 1.0 (*) 1.5 - 2.5 mg/dL  PHOSPHORUS      Status: None   Collection Time    04/27/13  6:53 AM      Result Value Ref Range   Phosphorus 3.6  2.3 - 4.6 mg/dL    Signed: Blain Pais, MD 04/27/2013, 1:03 PM   Time Spent on Discharge: 60 minutes Services Ordered on Discharge: None Equipment Ordered on Discharge: None

## 2013-04-27 NOTE — ED Notes (Signed)
Patient returned from X-ray 

## 2013-04-27 NOTE — ED Notes (Addendum)
Pt reports that he was discharged from hospital today. Pt reports that he was trembling prior to discharge. Pt was told that he could stay a couple more days but pt refused to at that time. Pt reports, "I wanted to get some air and they wouldn't let me go out." pt admits to drinking alcohol today.

## 2013-04-27 NOTE — Progress Notes (Signed)
We were notified that the pt wants to leave AMA. We went to evaluate the patient. Pt states he wants to leave to do things. We explained to him that we would like for him to stay one more day so we could continue treating his aspiration pneumonia and his hypertension. He had a clear plan and understood and repeated the risks and benefits of leaving the hospital that included death. He stated he would not be alone and if developed any serious side effects would report immediately to medical attention by going to the ED. Pt was oriented x3 and competent. Pt was not suicidal or homicidal.   The patient has decided to leave against medical advice. The patient has a normal mental status, and understands the risks of leaving, including permanent disability and/or death, and has had an opportunity to ask questions about his medical condition. The patient has been informed that he may return for care at any time, and follow up has been arranged.  He will be provided prescription for amlodipine and Augmentin (for 7days) prior to his departure.

## 2013-04-27 NOTE — ED Notes (Signed)
Patient transported to X-ray 

## 2013-04-27 NOTE — Progress Notes (Signed)
Patient asking to leave AMA. Teaching Service notified. Came and seen patient. Agreed with my assessment that patient is fully alert and oriented and not a harm to himself or others. Patient stating he is leaving and has things to do at home. Removed IV's with no bleeding noted. Informed patient once he leaves the hospital he is his own responsibility and the hospital can not be held liable for his decision. Joslyn Hy, MSN, RN, [employer]

## 2013-04-29 NOTE — Discharge Summary (Signed)
Patient left AMA while covering attending was Dr. Marinda Elk, refer to his note.  Dominic Pea, DO, [facility] 04/29/2013, 4:16 PM

## 2013-04-30 LAB — CULTURE, BLOOD (ROUTINE X 2)
CULTURE: NO GROWTH
Culture: NO GROWTH

## 2013-05-02 ENCOUNTER — Ambulatory Visit: Payer: Self-pay | Admitting: Internal Medicine

## 2013-05-03 ENCOUNTER — Ambulatory Visit (INDEPENDENT_AMBULATORY_CARE_PROVIDER_SITE_OTHER): Payer: Self-pay | Admitting: Internal Medicine

## 2013-05-03 ENCOUNTER — Encounter (INDEPENDENT_AMBULATORY_CARE_PROVIDER_SITE_OTHER): Payer: Self-pay

## 2013-05-03 ENCOUNTER — Encounter: Payer: Self-pay | Admitting: Internal Medicine

## 2013-05-03 VITALS — BP 122/80 | HR 108 | Temp 99.4°F | Ht 65.0 in | Wt 118.8 lb

## 2013-05-03 DIAGNOSIS — Z5181 Encounter for therapeutic drug level monitoring: Secondary | ICD-10-CM

## 2013-05-03 DIAGNOSIS — Z79899 Other long term (current) drug therapy: Secondary | ICD-10-CM

## 2013-05-03 DIAGNOSIS — F1020 Alcohol dependence, uncomplicated: Secondary | ICD-10-CM

## 2013-05-03 DIAGNOSIS — J690 Pneumonitis due to inhalation of food and vomit: Secondary | ICD-10-CM

## 2013-05-03 DIAGNOSIS — R569 Unspecified convulsions: Secondary | ICD-10-CM

## 2013-05-03 DIAGNOSIS — IMO0002 Reserved for concepts with insufficient information to code with codable children: Secondary | ICD-10-CM

## 2013-05-03 DIAGNOSIS — I10 Essential (primary) hypertension: Secondary | ICD-10-CM

## 2013-05-03 DIAGNOSIS — L03113 Cellulitis of right upper limb: Secondary | ICD-10-CM

## 2013-05-03 DIAGNOSIS — F17200 Nicotine dependence, unspecified, uncomplicated: Secondary | ICD-10-CM

## 2013-05-03 DIAGNOSIS — E781 Pure hyperglyceridemia: Secondary | ICD-10-CM

## 2013-05-03 LAB — COMPLETE METABOLIC PANEL WITH GFR
ALT: 47 U/L (ref 0–53)
AST: 54 U/L — ABNORMAL HIGH (ref 0–37)
Albumin: 4.1 g/dL (ref 3.5–5.2)
Alkaline Phosphatase: 93 U/L (ref 39–117)
BILIRUBIN TOTAL: 0.2 mg/dL (ref 0.2–1.2)
BUN: 11 mg/dL (ref 6–23)
CO2: 27 meq/L (ref 19–32)
CREATININE: 0.83 mg/dL (ref 0.50–1.35)
Calcium: 9.6 mg/dL (ref 8.4–10.5)
Chloride: 102 mEq/L (ref 96–112)
GLUCOSE: 87 mg/dL (ref 70–99)
Potassium: 4.5 mEq/L (ref 3.5–5.3)
Sodium: 141 mEq/L (ref 135–145)
Total Protein: 7.2 g/dL (ref 6.0–8.3)

## 2013-05-03 LAB — MAGNESIUM: MAGNESIUM: 1 mg/dL — AB (ref 1.5–2.5)

## 2013-05-03 MED ORDER — PHENYTOIN SODIUM EXTENDED 100 MG PO CAPS: 100.0000 mg | ORAL_CAPSULE | Freq: Three times a day (TID) | ORAL | Status: DC

## 2013-05-03 MED ORDER — AMLODIPINE BESYLATE 10 MG PO TABS: 10.0000 mg | ORAL_TABLET | Freq: Every day | ORAL | Status: DC

## 2013-05-03 MED ORDER — SULFAMETHOXAZOLE-TMP DS 800-160 MG PO TABS: 1.0000 | ORAL_TABLET | Freq: Two times a day (BID) | ORAL | Status: DC

## 2013-05-03 NOTE — Patient Instructions (Signed)
Please stop taking Hydrochlorothiazide (Blood Pressure) Start taking Amlodipine 10mg  daily for blood pressure Take Bactrim DS (Antibiotic) 1 tablet twice a day for 5 days for your arm pain Please Keep follow up appointment with Dr. Doug Sou  Please bring your medicines with you each time you come.   Medicines may be  Eye drops  Herbal   Vitamins  Pills  Seeing these help [place] take care of you.

## 2013-05-03 NOTE — Progress Notes (Signed)
[facility] Subjective:   Patient ID: Joseph Underwood male   DOB: Oct 22, 1965 48 y.o.   MRN: [id number]  HPI: Joseph Underwood is a 48 y.o. male with a PMH significant for HTN, Alcohol abuse, Tobacco abuse. He presents today for HFU after recently being admitted for Alcohol withdraw seizures and spesis secondary to aspiration pneumonia.  He left the hospital AMA on 3/14. He returned to the ED on 3/15 with concern for weakness and trembling, he was noted to be intoxicated and no seizure activity was noted.  He was discharged home. Since the ED he reports he has continued to have some dizziness and some right arm pain.  He reports that his right arm has been swollen and notes that this was the arm that he had his PIV. He has not noticed any redness at the area. He also notes that he has had very liquid stools roughly 3 times a day, for about 3 days. But has now resolved.  He reports that he has been taking the Thiamine that he was prescribed.  He did not feel the Augmentin that he was prescribed. He has been taking dilantin 3 times a day.  He reports he has had no further episodes of coughing, fever, chills, or SOB. No further seizure activity.  He currently drinks 1 pint of gin every 2 days.  Last drink was last night.  He is not willing to attend a detox program.   HCTZ was discontinued on discharge and was changed to amlodipine, he reports he did not pick up amlodipine and has still be taking HCTZ.  Past Medical History  Diagnosis Date  . Seizures   . Alcohol abuse   . Hiccups   . HTN (hypertension)    Current Outpatient Prescriptions  Medication Sig Dispense Refill  . amLODipine (NORVASC) 10 MG tablet Take 1 tablet (10 mg total) by mouth daily.  30 tablet  2  . amoxicillin-clavulanate (AUGMENTIN) 875-125 MG per tablet Take 1 tablet by mouth every 12 (twelve) hours.  14 tablet  0  . folic acid (FOLVITE) 1 MG tablet Take 1 tablet (1 mg total) by mouth daily.  30  tablet  2  . phenytoin (DILANTIN) 100 MG ER capsule Take 1 capsule (100 mg total) by mouth 3 (three) times daily.  90 capsule  0  . thiamine 100 MG tablet Take 1 tablet (100 mg total) by mouth daily.  30 tablet  2   No current facility-administered medications for this visit.   No family history on file. History   Social History  . Marital Status: Single    Spouse Name: N/A    Number of Children: N/A  . Years of Education: N/A   Social History Main Topics  . Smoking status: Current Every Day Smoker -- 0.50 packs/day for 25 years    Types: Cigarettes  . Smokeless tobacco: Never Used  . Alcohol Use: Yes     Comment: a fifth every other day or 4-5 beers per day  . Drug Use: No  . Sexual Activity: Not on file   Other Topics Concern  . Not on file   Social History Narrative  . No narrative on file   Review of Systems: Review of Systems  Constitutional: Negative for fever, chills and malaise/fatigue.  HENT: Negative for sore throat.   Eyes: Negative for blurred vision.  Respiratory: Negative for cough, sputum production and shortness of breath.   Cardiovascular: Negative for chest pain  and leg swelling.  Gastrointestinal: Negative for nausea, vomiting, abdominal pain, diarrhea, constipation and blood in stool.  Genitourinary: Negative for dysuria.  Musculoskeletal: Negative for back pain and joint pain.  Skin: Negative for rash.  Neurological: Negative for dizziness, weakness and headaches.  Psychiatric/Behavioral: Positive for substance abuse.     Objective:  Physical Exam: Filed Vitals:   05/03/13 1341  BP: 122/80  Pulse: 108  Temp: 99.4 F (37.4 C)  TempSrc: Oral  Height: 5\' 5"  (1.651 m)  Weight: 118 lb 12.8 oz (53.887 kg)  SpO2: 97%  Physical Exam  Nursing note and vitals reviewed. Constitutional: He appears well-developed and well-nourished. No distress.  HENT:  Head: Normocephalic and atraumatic.  Mouth/Throat: Oropharynx is clear and moist.  Eyes: EOM  are normal. Pupils are equal, round, and reactive to light. No scleral icterus.  Cardiovascular: Regular rhythm and normal heart sounds.   tachycardic  Pulmonary/Chest: Effort normal and breath sounds normal.  Abdominal: Soft. Bowel sounds are normal. He exhibits no distension. There is no tenderness.  Musculoskeletal: He exhibits no edema.  Skin:     Psychiatric: He has a normal mood and affect.     Assessment & Plan:  Case discussed with Dr. Lynnae January See Problem Based Assessment and Plan Medications Ordered Meds ordered this encounter  Medications  . sulfamethoxazole-trimethoprim (BACTRIM DS) 800-160 MG per tablet    Sig: Take 1 tablet by mouth 2 (two) times daily.    Dispense:  10 tablet    Refill:  0    Please discontinue Rx for Augmentin.  Marland Kitchen amLODipine (NORVASC) 10 MG tablet    Sig: Take 1 tablet (10 mg total) by mouth daily.    Dispense:  30 tablet    Refill:  2  . phenytoin (DILANTIN) 100 MG ER capsule    Sig: Take 1 capsule (100 mg total) by mouth 3 (three) times daily.    Dispense:  90 capsule    Refill:  1   Other Orders Orders Placed This Encounter  Procedures  . CMP with Estimated GFR ([id number])  . Phenytoin (Dilantin)  . Magnesium

## 2013-05-03 NOTE — Assessment & Plan Note (Signed)
Patient with mild cellulitis of right arm at PIV site, no systemic symptoms - Bactrim DS.

## 2013-05-04 LAB — PHENYTOIN LEVEL, TOTAL: Phenytoin Lvl: <2.5 ug/mL — ABNORMAL LOW (ref 10.0–20.0)

## 2013-05-05 ENCOUNTER — Encounter: Payer: Self-pay | Admitting: Internal Medicine

## 2013-05-05 NOTE — Assessment & Plan Note (Signed)
  Assessment: Progress toward smoking cessation:  smoking the same amount Barriers to progress toward smoking cessation:  lack of motivation to quit Comments: No desire to quit  Plan: Instruction/counseling given:  I counseled patient on the dangers of tobacco use, advised patient to stop smoking, and reviewed strategies to maximize success. Educational resources provided:    Self management tools provided:    Medications to assist with smoking cessation:  None Patient agreed to the following self-care plans for smoking cessation:    Other plans:

## 2013-05-05 NOTE — Assessment & Plan Note (Signed)
BP Readings from Last 3 Encounters:  05/03/13 122/80  04/27/13 142/97  04/27/13 150/70    Lab Results  Component Value Date   NA 141 05/03/2013   K 4.5 05/03/2013   CREATININE 0.83 05/03/2013    Assessment: Blood pressure control: controlled Progress toward BP goal:  at goal Comments: Reports has been taking HCTZ not amlodipine.  Plan: Medications:  Amlodipine 10mg  daily Educational resources provided:   Self management tools provided:   Other plans: [facility], do not have Rx there (appears it may have been a printed script).  Ordered refill of Amlodipine, discontinued remaining HCTZ Rx given hypokalemia.

## 2013-05-05 NOTE — Assessment & Plan Note (Signed)
Patient has already eaten today, will need to get fasting Lipid panel at next visit.

## 2013-05-05 NOTE — Assessment & Plan Note (Signed)
Patient reports he did not take augmentin or fill Rx, per [facility] he did pick up script for augmentin. - Pulmonary exam clear, will treat cellulitis with Bactrim which will also have coverage for aspiration PNA.

## 2013-05-05 NOTE — Assessment & Plan Note (Signed)
No desire to quit at this time, went over dangers of alcohol and of withdraw given he is at very high risk as has had previous withdraw seizure.  I ask that he contact our office if considers alcohol cessation.

## 2013-05-05 NOTE — Assessment & Plan Note (Signed)
Reports compliance with Dilantin and no further seizure activity. - Ordered Dilantin level, however after patient left I called his pharmacy, he has not picked up dilantin since last august.  I reordered dilantin as there were no scripts available at the pharmacy.  Will need another Dilantin level checked at next visit.

## 2013-05-06 MED ORDER — MAGNESIUM OXIDE -MG SUPPLEMENT 400 (240 MG) MG PO TABS: 1.0000 | ORAL_TABLET | Freq: Every day | ORAL | Status: DC

## 2013-05-06 NOTE — Assessment & Plan Note (Signed)
Mg++ 1.0 on labs checked today, likely secondary to ETOH abuse.  Patient should be taking MVI but unsure of compliance. - MagOx 400mg  daily (below recommend 800mg  daily to reduce likelihood of diarrhea and increase compliance)

## 2013-05-06 NOTE — Addendum Note (Signed)
Addended by: Lucious Groves on: 05/06/2013 09:12 AM   Modules accepted: Orders

## 2013-05-09 NOTE — Progress Notes (Signed)
Case discussed with Dr. Hoffman at the time of the visit.  We reviewed the resident's history and exam and pertinent patient test results.  I agree with the assessment, diagnosis, and plan of care documented in the resident's note. 

## 2013-05-12 ENCOUNTER — Emergency Department (HOSPITAL_COMMUNITY)
Admission: EM | Admit: 2013-05-12 | Discharge: 2013-05-12 | Disposition: A | Discharge status: Home / Self Care | Payer: Self-pay | Attending: Emergency Medicine | Admitting: Emergency Medicine

## 2013-05-12 ENCOUNTER — Encounter (HOSPITAL_COMMUNITY): Payer: Self-pay | Admitting: Emergency Medicine

## 2013-05-12 DIAGNOSIS — R000 Tachycardia, unspecified: Secondary | ICD-10-CM | POA: Insufficient documentation

## 2013-05-12 DIAGNOSIS — R42 Dizziness and giddiness: Secondary | ICD-10-CM | POA: Insufficient documentation

## 2013-05-12 DIAGNOSIS — R160 Hepatomegaly, not elsewhere classified: Secondary | ICD-10-CM | POA: Insufficient documentation

## 2013-05-12 DIAGNOSIS — R1110 Vomiting, unspecified: Secondary | ICD-10-CM | POA: Insufficient documentation

## 2013-05-12 DIAGNOSIS — R251 Tremor, unspecified: Secondary | ICD-10-CM

## 2013-05-12 DIAGNOSIS — F10929 Alcohol use, unspecified with intoxication, unspecified: Secondary | ICD-10-CM

## 2013-05-12 DIAGNOSIS — F1010 Alcohol abuse, uncomplicated: Secondary | ICD-10-CM | POA: Insufficient documentation

## 2013-05-12 DIAGNOSIS — Z79899 Other long term (current) drug therapy: Secondary | ICD-10-CM | POA: Insufficient documentation

## 2013-05-12 DIAGNOSIS — F17200 Nicotine dependence, unspecified, uncomplicated: Secondary | ICD-10-CM | POA: Insufficient documentation

## 2013-05-12 DIAGNOSIS — I10 Essential (primary) hypertension: Secondary | ICD-10-CM | POA: Insufficient documentation

## 2013-05-12 DIAGNOSIS — Z792 Long term (current) use of antibiotics: Secondary | ICD-10-CM | POA: Insufficient documentation

## 2013-05-12 DIAGNOSIS — R259 Unspecified abnormal involuntary movements: Secondary | ICD-10-CM | POA: Insufficient documentation

## 2013-05-12 DIAGNOSIS — G40909 Epilepsy, unspecified, not intractable, without status epilepticus: Secondary | ICD-10-CM | POA: Insufficient documentation

## 2013-05-12 LAB — COMPREHENSIVE METABOLIC PANEL
ALT: 39 U/L (ref 0–53)
AST: 122 U/L — ABNORMAL HIGH (ref 0–37)
Albumin: 3.8 g/dL (ref 3.5–5.2)
Alkaline Phosphatase: 95 U/L (ref 39–117)
BUN: 9 mg/dL (ref 6–23)
CO2: 22 mEq/L (ref 19–32)
Calcium: 8.7 mg/dL (ref 8.4–10.5)
Chloride: 95 mEq/L — ABNORMAL LOW (ref 96–112)
Creatinine, Ser: 0.66 mg/dL (ref 0.50–1.35)
GFR calc Af Amer: >90 mL/min (ref >90)
GFR calc non Af Amer: >90 mL/min (ref >90)
Glucose, Bld: 71 mg/dL (ref 70–99)
Potassium: 3.6 mEq/L — ABNORMAL LOW (ref 3.7–5.3)
Sodium: 141 mEq/L (ref 137–147)
Total Bilirubin: <0.2 mg/dL — ABNORMAL LOW (ref 0.3–1.2)
Total Protein: 7.7 g/dL (ref 6.0–8.3)

## 2013-05-12 LAB — CBC WITH DIFFERENTIAL/PLATELET
BASOS ABS: 0.2 10*3/uL — AB (ref 0.0–0.1)
Basophils Relative: 3 % — ABNORMAL HIGH (ref 0–1)
Eosinophils Absolute: 0.2 10*3/uL (ref 0.0–0.7)
Eosinophils Relative: 3 % (ref 0–5)
HCT: 31.3 % — ABNORMAL LOW (ref 39.0–52.0)
Hemoglobin: 11.3 g/dL — ABNORMAL LOW (ref 13.0–17.0)
LYMPHS ABS: 2.6 10*3/uL (ref 0.7–4.0)
Lymphocytes Relative: 45 % (ref 12–46)
MCH: 34.8 pg — ABNORMAL HIGH (ref 26.0–34.0)
MCHC: 36.1 g/dL — ABNORMAL HIGH (ref 30.0–36.0)
MCV: 96.3 fL (ref 78.0–100.0)
MONO ABS: 0.2 10*3/uL (ref 0.1–1.0)
Monocytes Relative: 4 % (ref 3–12)
NEUTROS PCT: 45 % (ref 43–77)
Neutro Abs: 2.5 10*3/uL (ref 1.7–7.7)
Platelets: 300 10*3/uL (ref 150–400)
RBC: 3.25 MIL/uL — ABNORMAL LOW (ref 4.22–5.81)
RDW: 13.7 % (ref 11.5–15.5)
WBC: 5.7 10*3/uL (ref 4.0–10.5)

## 2013-05-12 LAB — I-STAT TROPONIN, ED: Troponin i, poc: 0.03 ng/mL (ref 0.00–0.08)

## 2013-05-12 LAB — ETHANOL: Alcohol, Ethyl (B): 292 mg/dL — ABNORMAL HIGH (ref 0–11)

## 2013-05-12 LAB — MAGNESIUM: Magnesium: 1 mg/dL — ABNORMAL LOW (ref 1.5–2.5)

## 2013-05-12 MED ORDER — SODIUM CHLORIDE 0.9 % IV SOLN
1000.0000 mg | Freq: Once | INTRAVENOUS | Status: AC
  Administered 2013-05-12: 1000 mg via INTRAVENOUS
  Filled 2013-05-12: qty 20

## 2013-05-12 MED ORDER — KETOROLAC TROMETHAMINE 30 MG/ML IJ SOLN
30.0000 mg | Freq: Once | INTRAMUSCULAR | Status: AC
  Administered 2013-05-12: 30 mg via INTRAVENOUS
  Filled 2013-05-12: qty 1

## 2013-05-12 MED ORDER — LORAZEPAM 2 MG/ML IJ SOLN
1.0000 mg | Freq: Once | INTRAMUSCULAR | Status: AC
  Administered 2013-05-12: 1 mg via INTRAVENOUS
  Filled 2013-05-12: qty 1

## 2013-05-12 MED ORDER — MAGNESIUM OXIDE 400 (241.3 MG) MG PO TABS
400.0000 mg | ORAL_TABLET | Freq: Once | ORAL | Status: AC
  Administered 2013-05-12: 400 mg via ORAL
  Filled 2013-05-12: qty 1

## 2013-05-12 NOTE — Progress Notes (Signed)
Pharmacy consult for phenytoin loading dose  63 YOM with hx of EtOH abuse and seizures d/o who presents after apparent seizure at home. Pharmacy is consulted to phenytoin loading dose. Per Med history, he takes phenytoin 100mg  TID, but noncompliant to his medication. Most recent phenytoin level was < 2.5 on 3/20.   Plan: - Phenytoin 1000 mg IV x 1 (~ 20mg /kg) loading dose - Will f/u plans if patient is admitted.

## 2013-05-12 NOTE — ED Provider Notes (Signed)
CSN: [id number]     Arrival date & time 05/12/13  1912 History   First MD Initiated Contact with Patient 05/12/13 1956     Chief Complaint  Patient presents with  . Tremors   HPI  History provided by the patient and recent medical charts. Patient is a 48 year old male with history of seizure disorder, alcohol abuse, hypertension who presents with symptoms of tremors and shakes in the hands and slight lightheadedness. Patient states he started to have some shakes and feeling weird as if he might have a seizure earlier this afternoon. He called 911 and was brought to the emergency room. Patient states he has not been taking his Dilantin since Friday. Patient does admit to alcohol use today. He did not actually have any seizure activity. Denies any recent fever, chills or sweats. Denies any chest pain or shortness of breath. No abdominal pain. He has had some recent nausea and vomiting. No diarrhea. No other aggravating or alleviating factors. No other associated symptoms.   Past Medical History  Diagnosis Date  . Seizures   . Alcohol abuse   . Hiccups   . HTN (hypertension)    Past Surgical History  Procedure Laterality Date  . No past surgeries     History reviewed. No pertinent family history. History  Substance Use Topics  . Smoking status: Current Every Day Smoker -- 0.50 packs/day for 25 years    Types: Cigarettes  . Smokeless tobacco: Never Used  . Alcohol Use: Yes     Comment: a fifth every other day or 4-5 beers per day    Review of Systems  Constitutional: Negative for fever, chills and diaphoresis.  Respiratory: Negative for shortness of breath.   Cardiovascular: Negative for chest pain.  Gastrointestinal: Positive for vomiting. Negative for abdominal pain and diarrhea.  Neurological: Positive for tremors and light-headedness. Negative for headaches.  All other systems reviewed and are negative.      Allergies  Review of patient's allergies indicates no known  allergies.  Home Medications   Current Outpatient Rx  Name  Route  Sig  Dispense  Refill  . amLODipine (NORVASC) 10 MG tablet   Oral   Take 1 tablet (10 mg total) by mouth daily.   30 tablet   2   . folic acid (FOLVITE) 1 MG tablet   Oral   Take 1 tablet (1 mg total) by mouth daily.   30 tablet   2   . Magnesium Oxide 400 (240 MG) MG TABS   Oral   Take 1 tablet by mouth daily.   30 tablet   2   . phenytoin (DILANTIN) 100 MG ER capsule   Oral   Take 1 capsule (100 mg total) by mouth 3 (three) times daily.   90 capsule   1   . sulfamethoxazole-trimethoprim (BACTRIM DS) 800-160 MG per tablet   Oral   Take 1 tablet by mouth 2 (two) times daily.   10 tablet   0     Please discontinue Rx for Augmentin.   Marland Kitchen thiamine 100 MG tablet   Oral   Take 1 tablet (100 mg total) by mouth daily.   30 tablet   2    BP 193/114  Pulse 126  Temp(Src) 98.4 F (36.9 C) (Oral)  Resp 15  Ht 5\' 5"  (1.651 m)  Wt 118 lb (53.524 kg)  BMI 19.64 kg/m2  SpO2 99% Physical Exam  Nursing note and vitals reviewed. Constitutional: He is oriented to  person, place, and time. He appears well-developed and well-nourished. No distress.  HENT:  Head: Normocephalic and atraumatic.  Eyes: Conjunctivae are normal.  Neck: Normal range of motion. Neck supple.  Cardiovascular: Regular rhythm.  Tachycardia present.   Pulmonary/Chest: Effort normal and breath sounds normal. No respiratory distress. He has no wheezes. He has no rales.  Abdominal: Soft. He exhibits no distension. There is hepatomegaly. There is no rebound and no guarding.  Mild tenderness over the liver area.  Musculoskeletal: Normal range of motion.  Neurological: He is alert and oriented to person, place, and time.  Skin: Skin is warm.  Psychiatric: He has a normal mood and affect. His behavior is normal.    ED Course  Procedures   DIAGNOSTIC STUDIES: Oxygen Saturation is 99% on room air.    COORDINATION OF CARE:  Nursing  notes reviewed. Vital signs reviewed. Initial pt interview and examination performed.   8:15PM patient seen and evaluated. Patient appears well in no acute distress. He is tachycardia but normal respirations O2 sats. Does admit to alcohol use. Patient with similar presentation 2 weeks ago. Will check basic laboratory tests and treat elevated heart rate and blood pressure. Discussed work up plan with pt at bedside, which includes a sick lab testing. Pt agrees with plan.  Patient is not interested in alcohol detox.  Patient continues to appear well. His lab testing without any significant changes from recent testing. Alcohol level is significant elevated. Loading dose of Dilantin still going. After which if patient remains stable I feel he will be able to be discharged home and follow up with his primary care provider. I did strongly encourage him to consider help with his alcohol addiction.  Patient's heart rate has improved although still slightly tachycardic. I suspect this is related to his alcohol intoxication. Blood pressure also improved. Magnesium remains low unchanged. He does have prescription for magnesium oxide with him. I've instructed him to continue this. One dose given in the emergency department tonight. Patient otherwise stable for discharge at this time.   Treatment plan initiated: Medications  phenytoin (DILANTIN) 1,000 mg in sodium chloride 0.9 % 250 mL IVPB (1,000 mg Intravenous New Bag/Given 05/12/13 2134)  LORazepam (ATIVAN) injection 1 mg (1 mg Intravenous Given 05/12/13 2036)  ketorolac (TORADOL) 30 MG/ML injection 30 mg (30 mg Intravenous Given 05/12/13 2036)    Results for orders placed during the hospital encounter of 05/12/13  CBC WITH DIFFERENTIAL      Result Value Ref Range   WBC 5.7  4.0 - 10.5 K/uL   RBC 3.25 (*) 4.22 - 5.81 MIL/uL   Hemoglobin 11.3 (*) 13.0 - 17.0 g/dL   HCT 31.3 (*) 39.0 - 52.0 %   MCV 96.3  78.0 - 100.0 fL   MCH 34.8 (*) 26.0 - 34.0 pg    MCHC 36.1 (*) 30.0 - 36.0 g/dL   RDW 13.7  11.5 - 15.5 %   Platelets 300  150 - 400 K/uL   Neutrophils Relative % 45  43 - 77 %   Lymphocytes Relative 45  12 - 46 %   Monocytes Relative 4  3 - 12 %   Eosinophils Relative 3  0 - 5 %   Basophils Relative 3 (*) 0 - 1 %   Neutro Abs 2.5  1.7 - 7.7 K/uL   Lymphs Abs 2.6  0.7 - 4.0 K/uL   Monocytes Absolute 0.2  0.1 - 1.0 K/uL   Eosinophils Absolute 0.2  0.0 - 0.7  K/uL   Basophils Absolute 0.2 (*) 0.0 - 0.1 K/uL   RBC Morphology SPHEROCYTES     WBC Morphology ATYPICAL LYMPHOCYTES     Smear Review LARGE PLATELETS PRESENT    COMPREHENSIVE METABOLIC PANEL      Result Value Ref Range   Sodium 141  137 - 147 mEq/L   Potassium 3.6 (*) 3.7 - 5.3 mEq/L   Chloride 95 (*) 96 - 112 mEq/L   CO2 22  19 - 32 mEq/L   Glucose, Bld 71  70 - 99 mg/dL   BUN 9  6 - 23 mg/dL   Creatinine, Ser 0.66  0.50 - 1.35 mg/dL   Calcium 8.7  8.4 - 10.5 mg/dL   Total Protein 7.7  6.0 - 8.3 g/dL   Albumin 3.8  3.5 - 5.2 g/dL   AST 122 (*) 0 - 37 U/L   ALT 39  0 - 53 U/L   Alkaline Phosphatase 95  39 - 117 U/L   Total Bilirubin <0.2 (*) 0.3 - 1.2 mg/dL   GFR calc non Af Amer >90  >90 mL/min   GFR calc Af Amer >90  >90 mL/min  ETHANOL      Result Value Ref Range   Alcohol, Ethyl (B) 292 (*) 0 - 11 mg/dL  MAGNESIUM      Result Value Ref Range   Magnesium 1.0 (*) 1.5 - 2.5 mg/dL  I-STAT TROPOININ, ED      Result Value Ref Range   Troponin i, poc 0.03  0.00 - 0.08 ng/mL   Comment 3                 EKG Interpretation   Date/Time:  Sunday May 12 2013 19:23:28 EDT Ventricular Rate:  126 PR Interval:  149 QRS Duration: 88 QT Interval:  320 QTC Calculation: 463 R Axis:   98 Text Interpretation:  Sinus tachycardia   right atrial enlargement  Anterior infarct, old Confirmed by Jeneen Rinks  MD, [facility] ([id number]) on 05/12/2013  8:43:04 PM      MDM   Final diagnoses:  Tremor  Hypomagnesemia  Alcohol intoxication       Martie Lee, PA-C 05/12/13 2326

## 2013-05-12 NOTE — Discharge Instructions (Signed)
Please continue to take your medications as prescribed. Followup with your primary care provider this week for continued evaluation and treatment. Return at any time for changing or worsening symptoms.    Alcohol Problems Most adults who drink alcohol drink in moderation (not a lot) are at low risk for developing problems related to their drinking. However, all drinkers, including low-risk drinkers, should know about the health risks connected with drinking alcohol. RECOMMENDATIONS FOR LOW-RISK DRINKING  Drink in moderation. Moderate drinking is defined as follows:   Men - no more than 2 drinks per day.  Nonpregnant women - no more than 1 drink per day.  Over age 26 - no more than 1 drink per day. A standard drink is 12 grams of pure alcohol, which is equal to a 12 ounce bottle of beer or wine cooler, a 5 ounce glass of wine, or 1.5 ounces of distilled spirits (such as whiskey, brandy, vodka, or rum).  ABSTAIN FROM (DO NOT DRINK) ALCOHOL:  When pregnant or considering pregnancy.  When taking a medication that interacts with alcohol.  If you are alcohol dependent.  A medical condition that prohibits drinking alcohol (such as ulcer, liver disease, or heart disease). DISCUSS WITH YOUR CAREGIVER:  If you are at risk for coronary heart disease, discuss the potential benefits and risks of alcohol use: Light to moderate drinking is associated with lower rates of coronary heart disease in certain populations (for example, men over age 53 and postmenopausal women). Infrequent or nondrinkers are advised not to begin light to moderate drinking to reduce the risk of coronary heart disease so as to avoid creating an alcohol-related problem. Similar protective effects can likely be gained through proper diet and exercise.  Women and the elderly have smaller amounts of body water than men. As a result women and the elderly achieve a higher blood alcohol concentration after drinking the same amount of  alcohol.  Exposing a fetus to alcohol can cause a broad range of birth defects referred to as Fetal Alcohol Syndrome (FAS) or Alcohol-Related Birth Defects (ARBD). Although FAS/ARBD is connected with excessive alcohol consumption during pregnancy, studies also have reported neurobehavioral problems in infants born to mothers reporting drinking an average of 1 drink per day during pregnancy.  Heavier drinking (the consumption of more than 4 drinks per occasion by men and more than 3 drinks per occasion by women) impairs learning (cognitive) and psychomotor functions and increases the risk of alcohol-related problems, including accidents and injuries. CAGE QUESTIONS:   Have you ever felt that you should Cut down on your drinking?  Have people Annoyed you by criticizing your drinking?  Have you ever felt bad or Guilty about your drinking?  Have you ever had a drink first thing in the morning to steady your nerves or get rid of a hangover (Eye opener)? If you answered positively to any of these questions: You may be at risk for alcohol-related problems if alcohol consumption is:   Men: Greater than 14 drinks per week or more than 4 drinks per occasion.  Women: Greater than 7 drinks per week or more than 3 drinks per occasion. Do you or your family have a medical history of alcohol-related problems, such as:  Blackouts.  Sexual dysfunction.  Depression.  Trauma.  Liver dysfunction.  Sleep disorders.  Hypertension.  Chronic abdominal pain.  Has your drinking ever caused you problems, such as problems with your family, problems with your work (or school) performance, or accidents/injuries?  Do you have  a compulsion to drink or a preoccupation with drinking?  Do you have poor control or are you unable to stop drinking once you have started?  Do you have to drink to avoid withdrawal symptoms?  Do you have problems with withdrawal such as tremors, nausea, sweats, or mood  disturbances?  Does it take more alcohol than in the past to get you high?  Do you feel a strong urge to drink?  Do you change your plans so that you can have a drink?  Do you ever drink in the morning to relieve the shakes or a hangover? If you have answered a number of the previous questions positively, it may be time for you to talk to your caregivers, family, and friends and see if they think you have a problem. Alcoholism is a chemical dependency that keeps getting worse and will eventually destroy your health and relationships. Many alcoholics end up dead, impoverished, or in prison. This is often the end result of all chemical dependency.  Do not be discouraged if you are not ready to take action immediately.  Decisions to change behavior often involve up and down desires to change and feeling like you cannot decide.  Try to think more seriously about your drinking behavior.  Think of the reasons to quit. WHERE TO GO FOR ADDITIONAL INFORMATION   The [place] on Alcohol Abuse and Alcoholism (NIAAA) http://www.bradshaw.com/  [employer] on Alcoholism and Drug Dependence (NCADD) www.ncadd.[place] (ASAM) http://carpenter.net/  Document Released: 01/31/2005 Document Revised: 04/25/2011 Document Reviewed: 09/19/2007 [facility] Patient Information 2014 [place].   Magnesium Rich Diet Magnesium is a needed mineral found in the body. It is important for strong bones and a healthy heart. Magnesium also plays a role in muscle and nerve function. Certain medical conditions, such as poorly controlled diabetes and gastrointestinal disease, may lead to a lack of magnesium in the body (magnesium deficiency). SOURCES OF MAGNESIUM  Green vegetables such as spinach are good sources of magnesium.  Some beans and peas (legumes), nuts and seeds, and whole, unrefined grains are also good sources of magnesium. Refined grains are generally low in magnesium.  When white flour is refined and processed, the magnesium-rich germ and bran are removed.  Bread made from whole-grain wheat flour provides more magnesium than bread made from white refined flour.  Tap water can be a source of magnesium, but the amount varies according to the water supply. Water that naturally contains more minerals is described as "hard." Hard water contains more magnesium than "soft" water. Eating a wide variety of legumes, nuts, whole grains, and green vegetables will help you meet your daily dietary need for magnesium. Selected food sources of magnesium are listed below. FOOD SOURCES Food / Magnesium (mg)  Almonds, dry roasted, 1 oz / 80 mg  Cashews, dry roasted, 1 oz / 75 mg  Soybeans, mature, cooked,  cup / 75 mg  Spinach, frozen, cooked,  cup / 75 mg  Nuts, mixed, dry roasted, 1 oz / 65 mg  Cereal, shredded wheat, 1 bowl / 55 mg  Oatmeal, instant, prepared with water, 1 cup / 55 mg  Potato, baked with skin, 1 medium / 50 mg  Peanuts, dry roasted, 1 oz / 50 mg  Peanut butter, smooth, 2 tbs / 50 mg  Wheat bran, crude, 2 tbs / 45 mg  Black-eyed peas, cooked,  cup / 45 mg  Yogurt, plain, skim milk, 8 oz / 45 mg  Bran  flakes,  cup / 40 mg  Vegetarian baked beans,  cup / 40 mg  Brown rice, long-grained, cooked,  cup / 40 mg  Lentils, mature seeds, cooked,  cup / 35 mg  Avocado,  cup pureed / 35 mg  Kidney beans, canned,  cup / 35 mg  Pinto beans, cooked,  cup / 35 mg  Wheat germ, crude, 2 tbs / 35 mg  Chocolate milk, 1 cup / 33 mg  Banana, 1 medium / 30 mg  Milk chocolate candy bar, 1  oz / 28 mg  Milk, reduced fat (2%) or fat-free, 1 cup / 28 mg  Bread, whole-wheat, 1 slice / 25 mg  Raisins, seedless,  cup / 25 mg  Whole milk, 1 cup / 24 mg  Chocolate pudding, 4 oz / 24 mg RECOMMENDED DIETARY ALLOWANCES FOR MAGNESIUM The following list shows the Recommended Dietary Allowances for magnesium in milligrams per day for  children and adults. Age: 54 to 48 years  Male: 80 mg  Male: 80 mg Age: 90 to 48 years  Male: 130 mg  Male: 130 mg Age: 85 to 48 years  Male: 240 mg  Male: 240 mg Age: 24 to 48 years  Male: 410 mg  Male: 360 mg  Pregnant: 400 mg  Breastfeeding: 360 mg Age: 67 to 48 years  Male: 400 mg  Male: 310 mg  Pregnant: 350 mg  Breastfeeding: 310 mg Age: 546 years or older  Male: 420 mg  Male: 320 mg  Pregnant: 360 mg  Breastfeeding: 320 mg RECOMMENDED ADEQUATE INTAKE FOR MAGNESIUM FOR INFANTS There is not enough information on magnesium to establish Recommended Dietary Allowances for infants. The following list shows the average intake of magnesium in healthy, breastfed infants in milligrams per day. Age:  0 to 92 months  Male: 30 mg  Male: 30 mg Age: 64 to 62 months  Male: 3 mg  Male: 75 mg HEALTH RISKS OF TOO MUCH MAGNESIUM Dietary magnesium does not pose a health risk. However, large doses of magnesium in supplements can cause adverse effects such as diarrhea and abdominal cramping. Risk of magnesium toxicity increases with kidney failure, when the kidney loses the ability to remove excess magnesium. Very large doses of magnesium-containing laxatives and antacids have also been associated with magnesium toxicity. Signs of excess magnesium can be similar to magnesium deficiency and include changes in mental status, nausea, diarrhea, appetite loss, muscle weakness, difficulty breathing, extremely low blood pressure, and irregular heartbeat.  TOLERABLE UPPER INTAKE LEVELS FOR SUPPLEMENTAL MAGNESIUM The following list shows the tolerable limits (upper intake levels) for magnesium from supplements in healthy infants, children, and adults in milligrams per day. A caregiver may prescribe magnesium in higher doses for specific medical problems. There is no upper intake level for magnesium from food sources.  Age: Infants   Male: Undetermined  Male:  Undetermined Age: 26 to 48 years  Male: 110 mg  Male: 130 mg Age: 53 to 48 years  Male: 240 mg  Male: 240 mg Age: 641 years or older  Male: 350 mg  Male: 350 mg  Pregnant: 350 mg  Breastfeeding: 350 mg Document Released: 09/27/2004 Document Revised: 04/25/2011 Document Reviewed: 05/01/2008 [employer] Patient Information 2014 [place], [place].

## 2013-05-12 NOTE — ED Notes (Signed)
Per [facility] and patient confirms; has not taken any medication since Friday for seizure or blood pressure. Has been drinking liquor everyday. States he feels shaky and does not know, but maybe had a seizure last night, has felt strange today.

## 2013-05-12 NOTE — ED Notes (Signed)
Family at bedside. 

## 2013-05-13 ENCOUNTER — Ambulatory Visit (INDEPENDENT_AMBULATORY_CARE_PROVIDER_SITE_OTHER): Payer: Self-pay | Admitting: Internal Medicine

## 2013-05-13 ENCOUNTER — Encounter: Payer: Self-pay | Admitting: Internal Medicine

## 2013-05-13 DIAGNOSIS — I10 Essential (primary) hypertension: Secondary | ICD-10-CM

## 2013-05-13 MED ORDER — MAGNESIUM OXIDE 400 MG PO TABS: 400.0000 mg | ORAL_TABLET | Freq: Every day | ORAL | Status: DC

## 2013-05-13 NOTE — Assessment & Plan Note (Signed)
Given his seizure disorder re-inforced the importance of taking his magnesium.

## 2013-05-13 NOTE — Progress Notes (Signed)
Subjective:     Patient ID: Joseph Underwood, male   DOB: [birth year], 48 y.o.   MRN: [id number]  HPI The patient is a 48 YO man coming in for a follow up of an ER visit. He was seen in the emergency room for intoxication with alcohol with tremors. He has been off his dilantin and felt as though he would have a seizure although he did not have any seizure symptoms. He is still taking some antibiotics he thinks but is not sure of his medicines. He is having a mild headache right now. He is still drinking and does not wish to stop. His mother is out in the waiting room and although he wants her to come back she does not wish to and was hoping that the ED would keep him yesterday. He denies congestion, cough, SOB, chest pain. He did fall this morning and has a small scrape on his arm. He denies hitting his head or LOC.   Review of Systems  Constitutional: Positive for appetite change and fatigue. Negative for fever, chills and activity change.  Respiratory: Negative for cough, chest tightness, shortness of breath and wheezing.   Cardiovascular: Negative for chest pain and leg swelling.  Gastrointestinal: Positive for nausea. Negative for vomiting, abdominal pain, diarrhea and constipation.  Genitourinary: Negative for dysuria.  Musculoskeletal: Positive for myalgias.  Neurological: Positive for headaches. Negative for tremors, syncope, facial asymmetry, speech difficulty, weakness, light-headedness and numbness.       Objective:   Physical Exam  Constitutional: He is oriented to person, place, and time. He appears well-developed.  thin  HENT:  Head: Normocephalic and atraumatic.  Neck: Normal range of motion. Neck supple. No JVD present. No tracheal deviation present. No thyromegaly present.  Cardiovascular: Regular rhythm.   Fast rate  Pulmonary/Chest: Effort normal and breath sounds normal. No respiratory distress. He has no wheezes. He has no rales.  Abdominal: Soft. Bowel sounds are normal.  He exhibits no distension. There is no tenderness. There is no rebound.  Lymphadenopathy:    He has no cervical adenopathy.  Neurological: He is alert and oriented to person, place, and time. No cranial nerve deficit.       Assessment/Plan:   1. Please see problem oriented charting.  2. Disposition - Patient will be seen back in 1-2 months. His remaining antibiotics were taken (he had a 7 day course filled 3/16 which still has 3-4 pills left augmentin and also a course of bactrim filled on 3/20 with 3-4 fills left of 7 day course). Clarified his medicines with him and discussed the important of abstaining from alcohol or decreasing amounts. Also advised on the importance of taking his vitamins as low magnesium can adversely affect his health and seizure disorder.

## 2013-05-13 NOTE — Patient Instructions (Signed)
We think it is important to work on reducing the amount of alcohol you are drinking. Alcohol can make you more likely to have seizures and can make the levels of vitamins in your body go down.   Make sure to take your medicines everyday.   Drink plenty of water.   Come back in 1-2 months to check on your blood pressure. Call us if you are feeling bad before then or have problems at home. Our number is [phone].

## 2013-05-13 NOTE — Assessment & Plan Note (Signed)
BP controlled and reminded to just take amlodipine 10 mg daily. He will return in 1-2 months for BP recheck.

## 2013-05-16 NOTE — Progress Notes (Signed)
Case discussed with Dr. Kollar soon after the resident saw the patient.  We reviewed the resident's history and exam and pertinent patient test results.  I agree with the assessment, diagnosis, and plan of care documented in the resident's note. 

## 2013-05-19 NOTE — ED Provider Notes (Signed)
Medical screening examination/treatment/procedure(s) were performed by non-physician practitioner and as supervising physician I was immediately available for consultation/collaboration.   Dot Lanes, MD 05/19/13 1356

## 2013-06-07 ENCOUNTER — Encounter (HOSPITAL_COMMUNITY): Payer: Self-pay | Admitting: Emergency Medicine

## 2013-06-07 ENCOUNTER — Emergency Department (HOSPITAL_COMMUNITY)
Admission: EM | Admit: 2013-06-07 | Discharge: 2013-06-07 | Disposition: A | Discharge status: Home / Self Care | Payer: Self-pay | Attending: Emergency Medicine | Admitting: Emergency Medicine

## 2013-06-07 DIAGNOSIS — Z9114 Patient's other noncompliance with medication regimen: Secondary | ICD-10-CM

## 2013-06-07 DIAGNOSIS — G40909 Epilepsy, unspecified, not intractable, without status epilepticus: Secondary | ICD-10-CM | POA: Insufficient documentation

## 2013-06-07 DIAGNOSIS — Z91199 Patient's noncompliance with other medical treatment and regimen due to unspecified reason: Secondary | ICD-10-CM | POA: Insufficient documentation

## 2013-06-07 DIAGNOSIS — R7889 Finding of other specified substances, not normally found in blood: Secondary | ICD-10-CM

## 2013-06-07 DIAGNOSIS — F17200 Nicotine dependence, unspecified, uncomplicated: Secondary | ICD-10-CM | POA: Insufficient documentation

## 2013-06-07 DIAGNOSIS — I10 Essential (primary) hypertension: Secondary | ICD-10-CM | POA: Insufficient documentation

## 2013-06-07 DIAGNOSIS — Z79899 Other long term (current) drug therapy: Secondary | ICD-10-CM | POA: Insufficient documentation

## 2013-06-07 DIAGNOSIS — R791 Abnormal coagulation profile: Secondary | ICD-10-CM | POA: Insufficient documentation

## 2013-06-07 DIAGNOSIS — Z9119 Patient's noncompliance with other medical treatment and regimen: Secondary | ICD-10-CM | POA: Insufficient documentation

## 2013-06-07 LAB — PHENYTOIN LEVEL, TOTAL: PHENYTOIN LVL: 4.3 ug/mL — AB (ref 10.0–20.0)

## 2013-06-07 MED ORDER — SODIUM CHLORIDE 0.9 % IV SOLN
600.0000 mg | Freq: Once | INTRAVENOUS | Status: AC
  Administered 2013-06-07: 600 mg via INTRAVENOUS
  Filled 2013-06-07: qty 12

## 2013-06-07 MED ORDER — LORAZEPAM 1 MG PO TABS
1.0000 mg | ORAL_TABLET | Freq: Once | ORAL | Status: AC
  Administered 2013-06-07: 1 mg via ORAL
  Filled 2013-06-07: qty 1

## 2013-06-07 NOTE — ED Notes (Signed)
Called lab to check on Phenytoin level results. Lab states it is in process.

## 2013-06-07 NOTE — ED Provider Notes (Signed)
CSN: [id number]     Arrival date & time 06/07/13  1129 History   First MD Initiated Contact with Patient 06/07/13 1135     Chief Complaint  Patient presents with  . Seizures    HPI Pt was seen at 1140. Per EMS, witness report, and pt, c/o sudden onset and resolution of one episode of generalized seizure that occurred PTA. Pt's co-workers stated pt's seizure lasted for approximately 1 minute. EMS noted no seizure activity on their arrival to scene, and pt remained A&O while en route to the ED. Pt endorses long hx of seizure disorder, and having "at least 2 seizures/week" for the past several weeks. Pt also admits that he has not been taking his dilantin regularly and often skips several doses. Pt states he "feels ok" on his arrival to the ED. Denies headache, no visual changes, no focal motor weakness, no tingling/numbness in extremities, no CP/palpitations, no SOB/cough, no abd pain, no N/V/D.     Past Medical History  Diagnosis Date  . Seizures   . Alcohol abuse   . Hiccups   . HTN (hypertension)    Past Surgical History  Procedure Laterality Date  . No past surgeries      History  Substance Use Topics  . Smoking status: Current Every Day Smoker -- 0.50 packs/day for 25 years    Types: Cigarettes  . Smokeless tobacco: Never Used  . Alcohol Use: Yes     Comment: a fifth every other day or 4-5 beers per day    Review of Systems ROS: Statement: All systems negative except as marked or noted in the HPI; Constitutional: Negative for fever and chills. ; ; Eyes: Negative for eye pain, redness and discharge. ; ; ENMT: Negative for ear pain, hoarseness, nasal congestion, sinus pressure and sore throat. ; ; Cardiovascular: Negative for chest pain, palpitations, diaphoresis, dyspnea and peripheral edema. ; ; Respiratory: Negative for cough, wheezing and stridor. ; ; Gastrointestinal: Negative for nausea, vomiting, diarrhea, abdominal pain, blood in stool, hematemesis, jaundice and rectal  bleeding. . ; ; Genitourinary: Negative for dysuria, flank pain and hematuria. ; ; Musculoskeletal: Negative for back pain and neck pain. Negative for swelling and trauma.; ; Skin: Negative for pruritus, rash, abrasions, blisters, bruising and skin lesion.; ; Neuro: Negative for headache, lightheadedness and neck stiffness. Negative for weakness, extremity weakness, paresthesias, +seizure.     Allergies  Review of patient's allergies indicates no known allergies.  Home Medications   Prior to Admission medications   Medication Sig Start Date End Date Taking? Authorizing Provider  amLODipine (NORVASC) 10 MG tablet Take 1 tablet (10 mg total) by mouth daily. 05/03/13  Yes Joni Reining, DO  folic acid (FOLVITE) 1 MG tablet Take 1 tablet (1 mg total) by mouth daily. 04/27/13  Yes Blain Pais, MD  magnesium oxide (MAG-OX) 400 MG tablet Take 1 tablet (400 mg total) by mouth daily. 05/13/13  Yes Olga Millers, MD  phenytoin (DILANTIN) 100 MG ER capsule Take 1 capsule (100 mg total) by mouth 3 (three) times daily. 05/03/13  Yes Joni Reining, DO   BP 114/75  Pulse 110  Temp(Src) 97.9 F (36.6 C) (Oral)  Resp 17  Ht 5\' 5"  (1.651 m)  Wt 120 lb (54.432 kg)  BMI 19.97 kg/m2  SpO2 94% Physical Exam 1145: Physical examination:  Nursing notes reviewed; Vital signs and O2 SAT reviewed;  Constitutional: Well developed, Well nourished, Well hydrated, In no acute distress; Head:  Normocephalic, atraumatic; Eyes:  EOMI, PERRL, No scleral icterus; ENMT: Mouth and pharynx normal, Mucous membranes moist; Neck: Supple, Full range of motion, No lymphadenopathy; Cardiovascular: Tachycardic rate and rhythm, No murmur, rub, or gallop; Respiratory: Breath sounds clear & equal bilaterally, No rales, rhonchi, wheezes.  Speaking full sentences with ease, Normal respiratory effort/excursion; Chest: Nontender, Movement normal; Abdomen: Soft, Nontender, Nondistended, Normal bowel sounds; Genitourinary: No CVA  tenderness; Extremities: Pulses normal, No tenderness, No edema, No calf edema or asymmetry.; Neuro: AA&Ox3, Major CN grossly intact. No facial droop. Speech clear. Strength 5/5 equal bilat UE's and LE's. No gross focal motor or sensory deficits in extremities.; Skin: Color normal, Warm, Dry.   ED Course  Procedures     EKG Interpretation None      MDM  MDM Reviewed: previous chart, nursing note and vitals Interpretation: labs    Results for orders placed during the hospital encounter of 06/07/13  PHENYTOIN LEVEL, TOTAL      Result Value Ref Range   Phenytoin Lvl 4.3 (*) 10.0 - 20.0 ug/mL    1500:  Pt with known hx of seizure disorder. Endorses non-compliance with his meds. Dilantin level today is subtherapeutic. IV dilantin dose given.  Pt's VS per his baseline per EPIC chart review. Pt does not appear intoxicated nor in etoh withdrawal at this time (pt has hx of same). Pt wants to go home now. Pt reassures me he has enough dilantin tabs at home and does not need a new rx. Family would like to take him home now. Dx and testing d/w pt and family.  Questions answered.  Verb understanding, agreeable to d/c home with outpt f/u.         Alfonzo Feller, DO 06/09/13 1255

## 2013-06-07 NOTE — ED Notes (Signed)
Patient arrives via EMS with c/o seizure at work. Works with [employer]. Witnesses state seizure for approx. 1 minute. Patient arrives a/o x 4. States he [facility] [place] [facility]. Reports approximately 2 seizures per week for last several weeks. Has not followed up. No other complaints.

## 2013-06-07 NOTE — Discharge Instructions (Signed)
°Emergency Department Resource Guide °1) Find a Doctor and Pay Out of Pocket °Although you won't have to find out who is covered by your insurance plan, it is a good idea to ask around and get recommendations. You will then need to call the office and see if the doctor you have chosen will accept you as a new patient and what types of options they offer for patients who are self-pay. Some doctors offer discounts or will set up payment plans for their patients who do not have insurance, but you will need to ask so you aren't surprised when you get to your appointment. ° °2) Contact Your Local Health Department °Not all health departments have doctors that can see patients for sick visits, but many do, so it is worth a call to see if yours does. If you don't know where your local health department is, you can check in your phone book. The CDC also has a tool to help you locate your state's health department, and many state websites also have listings of all of their local health departments. ° °3) Find a Walk-in Clinic °If your illness is not likely to be very severe or complicated, you may want to try a walk in clinic. These are popping up all over the country in pharmacies, drugstores, and shopping centers. They're usually staffed by nurse practitioners or physician assistants that have been trained to treat common illnesses and complaints. They're usually fairly quick and inexpensive. However, if you have serious medical issues or chronic medical problems, these are probably not your best option. ° °No Primary Care Doctor: °- Call [facility] at  832-8000 - they can help you locate a primary care doctor that  accepts your insurance, provides certain services, etc. °- Physician Referral Service- 1-800-533-3463 ° °Chronic Pain Problems: °Organization         Address  Phone   Notes  °[facility]  (336) 297-2271 Patients need to be referred by their primary care doctor.  ° °Medication  Assistance: °Organization         Address  Phone   Notes  °[place] [facility] [address] °[place], [place] [address] (336) 641-8030 --Must be a resident of [place] °-- Must have NO insurance coverage whatsoever (no Medicaid/ Medicare, etc.) °-- The pt. MUST have a primary care doctor that directs their care regularly and follows them in the community °  °[facility]  (866) 331-1348   °[employer]  (888) 892-1162   ° °Agencies that provide inexpensive medical care: °Organization         Address  Phone   Notes  °[facility]  (336) 832-8035   °[facility]    (336) 832-7272   °[facility] [address] °[place], [place] [address] (336) 832-4777   °[facility] [place] [address], °[place] (336) 271-4999   °[facility]    (336) 373-0678   °[facility]    (336) 272-1050   °[facility] ° [address], [place] Phone:  (336) 832-4444, Fax:  (336) 832-4440 Hours of Operation:  9 am - 6 pm, M-F.  Also accepts Medicaid/Medicare and self-pay.  °[facility] ° [address], [place] Phone: (336) 832-3150, Fax: (336) [phone]. Hours of Operation:  8:30 am - 5:30 pm, M-F.  Also accepts Medicaid and self-pay.  °[facility] [place] [address], [place] Phone: (336) 878-6027   °[facility] [address], [place], [place] (336)723-1848, Ext. 123 Mondays & Thursdays: 7-9 AM.  First 15 patients are seen on a first come, first serve basis. °  ° °Medicaid-accepting [place] Providers: ° °Organization         Address  Phone   Notes  °[facility] [address], [place] (336) 641-2100 Also accepts self-pay patients.  °[facility] [address], [place] ° (336) 856-9996   °[facility] [address], [place]  (336) 288-8857   °[facility] [address] [place] [address], [place] (336) 299-7000   °Veita Bland [address], [place]  ° (336) 373-1557 Only accepts [place] Access Medicaid patients after they have their name applied to their card.  ° °Self-Pay (no insurance) in [place]: ° °Organization         Address  Phone   Notes  °Sickle Cell Patients, [facility] [address], [place] (336) 832-1970   °[facility] [address], [place] (336) 832-4400   °[facility] ° [address], [place] (336) 992-4800   °[facility]/Dr. Osei-Bonsu ° [address] [place] [address], [place] or [address], [place] (336) [phone] Phone number for both [place] and [place] locations is the same.  °[facility] [address], [place] (336) 299-0000   °[facility] [address] [place] [address], [place] or [address] (336) 852-7530 °(336) 878-2260   °[facility] [address], [place] (336) 350-1642, phone; (336) 294-5005, fax Sees patients 1st and 3rd Saturday of every month.  Must not qualify for public or private insurance (i.e. Medicaid, Medicare, [place] Health Choice, Veterans' Benefits) • Household income should be no more than 200% of the poverty level •The clinic cannot treat you if you are pregnant or think you are pregnant • Sexually transmitted diseases are not treated at the clinic.  ° ° °Dental Care: °Organization         Address  Phone  Notes  °[place] [facility] [address], [place] (336) 641-6152 Accepts children up to age 21 who are enrolled in Medicaid or [place] Health Choice; pregnant women with a Medicaid card; and children who have applied for Medicaid or [place] Health Choice, but were declined, whose parents can pay a reduced fee at time of service.  °[place]  [facility] [place]  [address], [place] (336) 641-7733 Accepts children up to age 21 who are enrolled in Medicaid or [place] Health Choice; pregnant women with a Medicaid card; and children who have applied for Medicaid or [place] Health Choice, but were declined, whose parents can pay a reduced fee at time of service.  °[facility] ° [address], [place] (336) 641-4533 Patients are seen by appointment only. Walk-ins are not accepted. [facility] will see patients 18 years of age and older. °Monday - Tuesday (8am-5pm) °Most Wednesdays (8:30-5pm) °$30 per visit, cash only  °[facility] ° [address], [place] (336) 641-4533 Patients are seen by appointment only. Walk-ins are not accepted. [facility] will see patients 18 years of age and older. °One   Wednesday Evening (Monthly: Volunteer Based).  $30 per visit, cash only  °[facility]  (919) 537-3737 for adults; Children under age 4, call Graduate Pediatric Dentistry at ([phone]) [phone]. Children aged 4-14, please call ([phone]) [phone] to request a pediatric application. ° Dental services are provided in all areas of dental care including fillings, crowns and bridges, complete and partial dentures, implants, gum treatment, root canals, and extractions. Preventive care is also provided. Treatment is provided to both adults and children. °Patients are selected via a lottery and there is often a waiting list. °  °[facility] [address], °[place] ° (336) 763-8833 [email or web address] °  °[facility] [address], [place], [place] (336)723-1848, Ext. 123 Second and Fourth Thursday of each month, opens at 6:30 AM; Clinic ends at 9 AM.  Patients are seen on a first-come first-served basis, and a limited number are seen during each clinic.  ° °[facility] ° [address], [place], [place] (336) 723-7904    Eligibility Requirements °You must have lived in [place], [place], or [place] counties for at least the last three months. °  You cannot be eligible for state or federal sponsored healthcare insurance, including Veterans Administration, Medicaid, or Medicare. °  You generally cannot be eligible for healthcare insurance through your employer.  °  How to apply: °Eligibility screenings are held every Tuesday and Wednesday afternoon from 1:00 pm until 4:00 pm. You do not need an appointment for the interview!  °[facility] [address], [place], [place] 336-631-2330   °[facility]  336-342-8273   °[facility]  336-703-3100   °[facility]  336-570-6415   ° °Behavioral Health Resources in the Community: °Intensive Outpatient Programs °Organization         Address  Phone  Notes  °[facility] [address], [place], [place] 336-878-6098   °[facility] [address], [place], [place] 336-832-9800   °[facility] [address], [place], [place] ° 336-882-2125   °[facility] [address],  °[place], [place] 1-800-853-5163 or 336-641-4981   °Substance Abuse Resources °Organization         Address  Phone  Notes  °[facility]  336-882-2125   °[facility]  336-784-9470   °[facility]  336-285-9073   °[facility]  336-845-3988   °Residential & Outpatient Substance Abuse Program  1-800-659-3381   °Psychological Services °Organization         Address  Phone  Notes  °[facility]  336- 832-9600   °[facility]  336- 378-7881   °[facility] [address], [place] 1-800-853-5163 or 336-641-4981   ° °Mobile Crisis Teams °Organization         Address  Phone  Notes  °[facility], Mobile Crisis Care Unit  1-877-626-1772   °Assertive °[facility] ° [address]  [place], [place] 336-834-9664   °Sharon DeEsch [address] °[place] 336-554-5454   ° °Self-Help/Support Groups °Organization         Address  Phone             Notes  °[facility] - variety of support groups  336- 373-1402 Call for more information  °Narcotics Anonymous (NA), [facility] [address], °[place]  2 meetings at this location  ° °  Residential Treatment Programs Organization         Address  Phone  Notes  [facility] [address]    [place]  1-(705)777-6535   [facility]  [address] [place] [address], [place], [place]   [place], [place] 862-022-1873 Admissions: 8am-3pm M-F  [facility] [address]    [place], [place] 638-466-5993   [facility] [address] [place], [place], [place]   [facility] [address],  [place], [place]   [facility] Intensive Outpatient [address], [place], [place]   [facility] [address]  [place], [place] 1-234 304 9044 or 248-510-5419   [facility] [address], [place], [place] Accepts Medicaid  [facility] [address]  [place] 1-607-172-2125 Substance Abuse/Addiction Treatment   [facility]         Address  Phone  Notes  [facility]  [phone]   Domenic Schwab, PhD [address] [place], [place]   272-185-7812 or [phone]   [place], [place] [phone]   [facility] [address], [place], [place] [phone] Insurance/Medicaid/sponsorship through [facility] [address]                                    [place], [place] [phone]) [phone] [facility] [address], [place] [phone]    Dr. Adele Schilder  [phone]   [facility] Dept. 1) [address], [place] 2) [place] 3)  [address], [place] [phone]  [phone]   [place] [phone] or ([phone] (After Hours)       Take your usual prescriptions as previously directed. You received an IV dose of dilantin while you were in the Emergency Department today.  Call your regular medical doctor today to schedule a follow up appointment within the next 3 days.  Return to the Emergency Department immediately sooner if worsening.

## 2013-06-07 NOTE — ED Notes (Signed)
Patient with no complaints at this time. Respirations even and unlabored. Skin warm/dry. Discharge instructions reviewed with patient at this time. Patient given opportunity to voice concerns/ask questions. IV removed per policy and band-aid applied to site. Patient discharged at this time and left Emergency Department with steady gait.  

## 2013-06-20 ENCOUNTER — Encounter: Payer: Self-pay | Admitting: Internal Medicine

## 2013-07-03 IMAGING — CT CT HEAD W/O CM
2 series · 15 of 30 positions shown, 19 images · non-contrast
Comparison: 10/27/2005

CLINICAL DATA: Seizure, fall, laceration to right eye

CT HEAD WITHOUT CONTRAST
TECHNIQUE: Contiguous axial images were obtained from the base of
the skull through the vertex without contrast.

[Series 2: head w/o · axial · non-contrast · 0.49mm/px · z∈[+115,+245]mm · 13 of 32 slices shown, 17 images]
[im 3/32  brain]
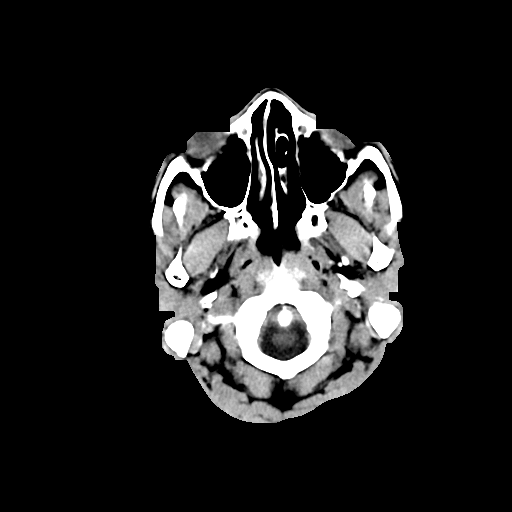
[im 3/32  bone]
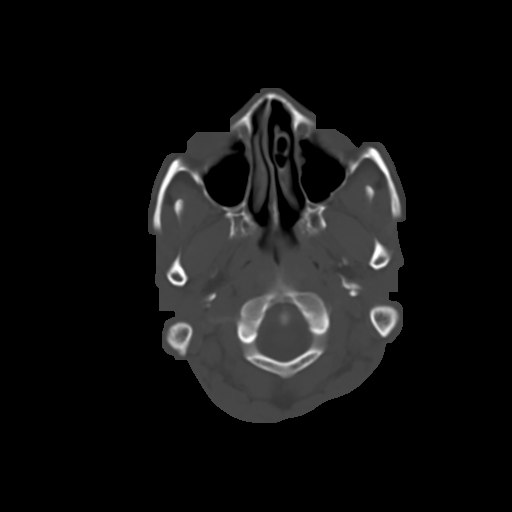
[im 5/32  brain]
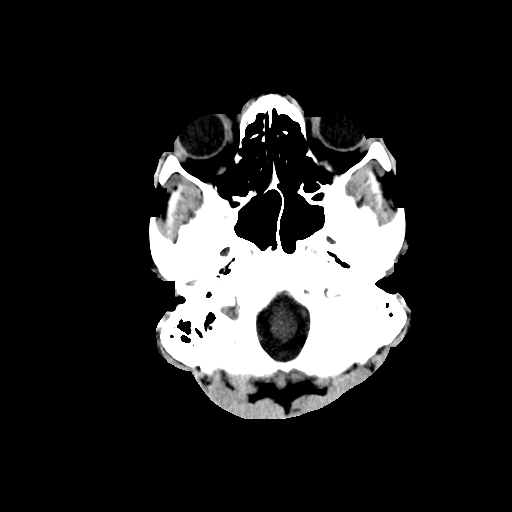
[im 7/32  brain]
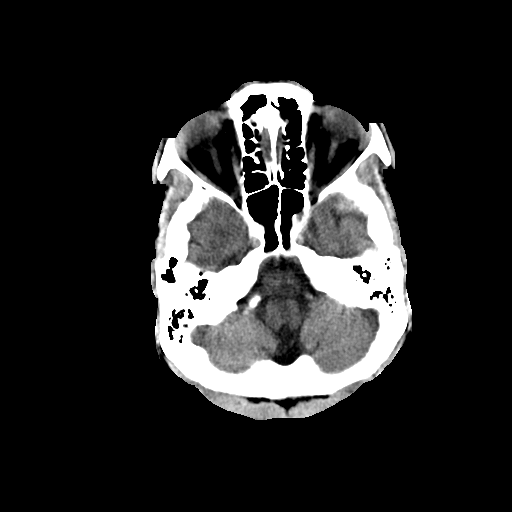
[im 9/32  brain]
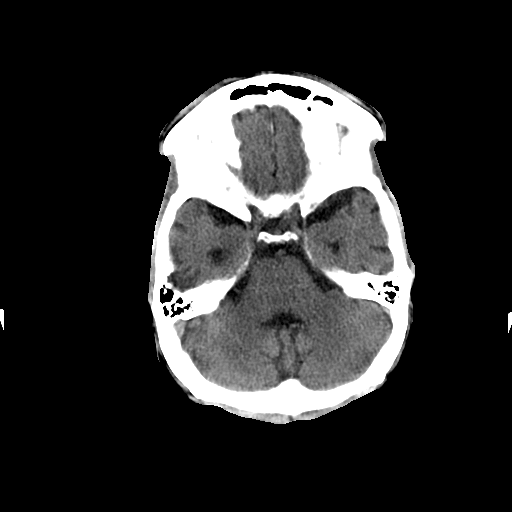
[im 12/32  brain]
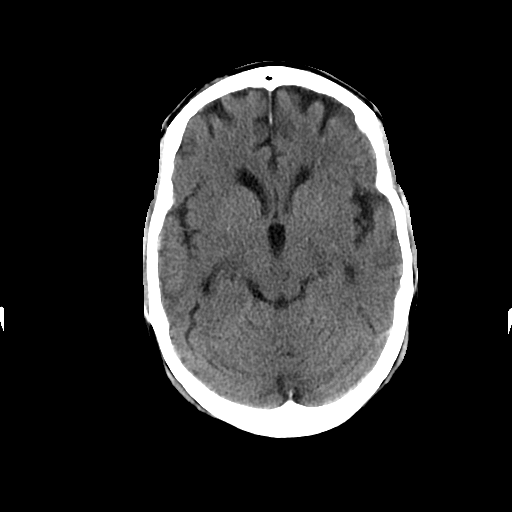
[im 12/32  bone]
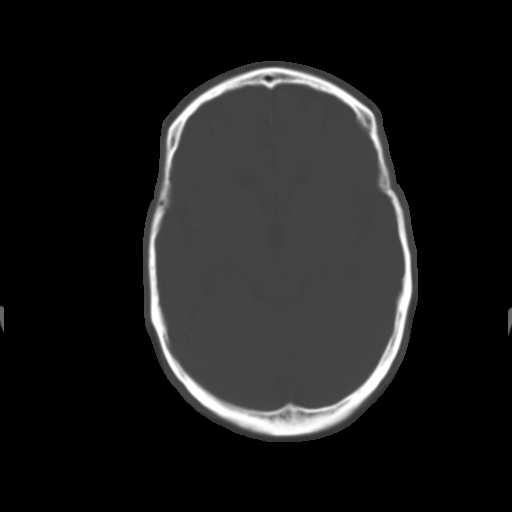
[im 14/32  brain]
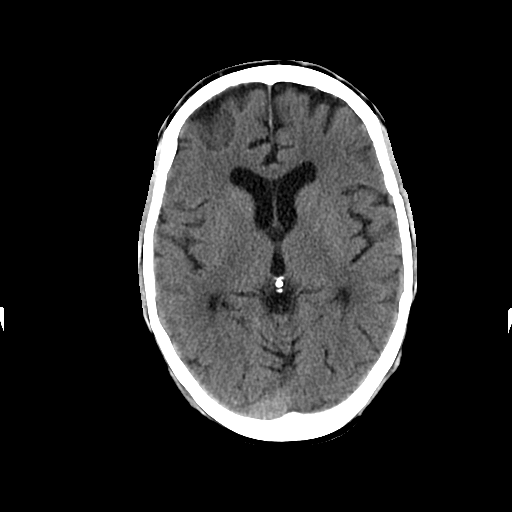
[im 16/32  brain]
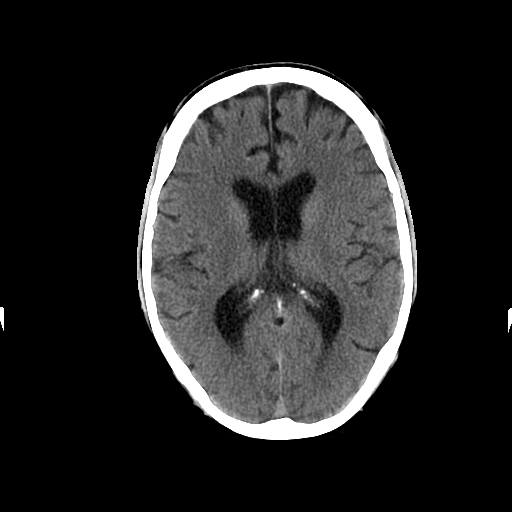
[im 18/32  brain]
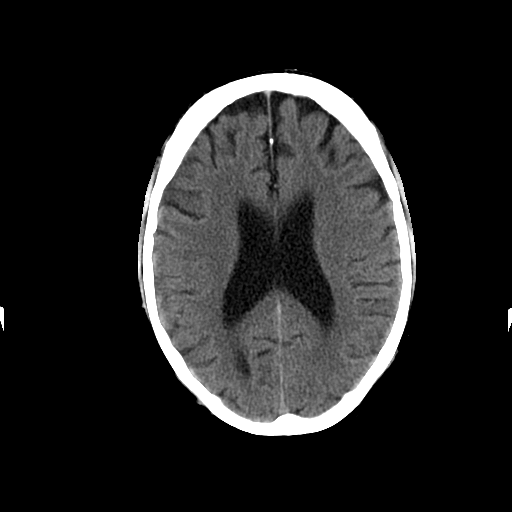
[im 20/32  brain]
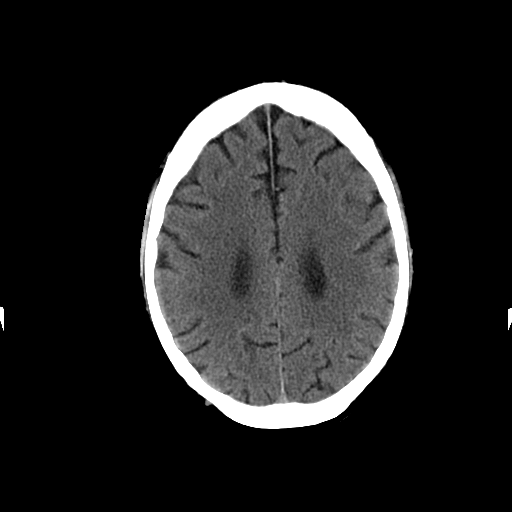
[im 20/32  bone]
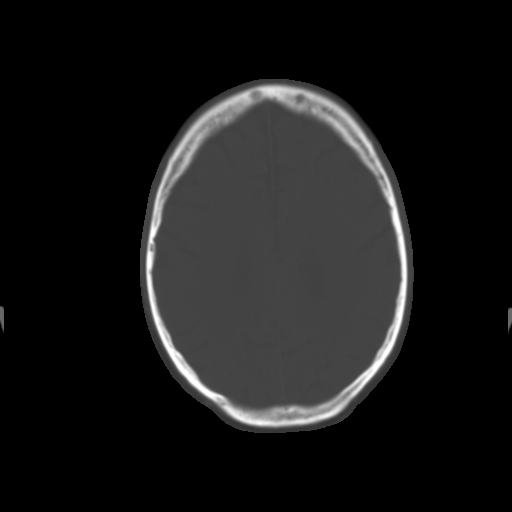
[im 23/32  brain]
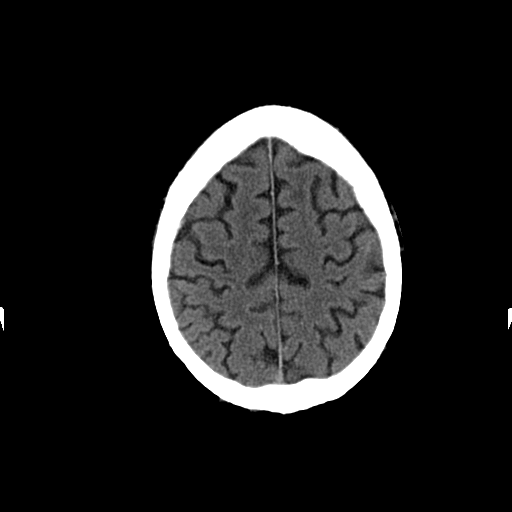
[im 25/32  brain]
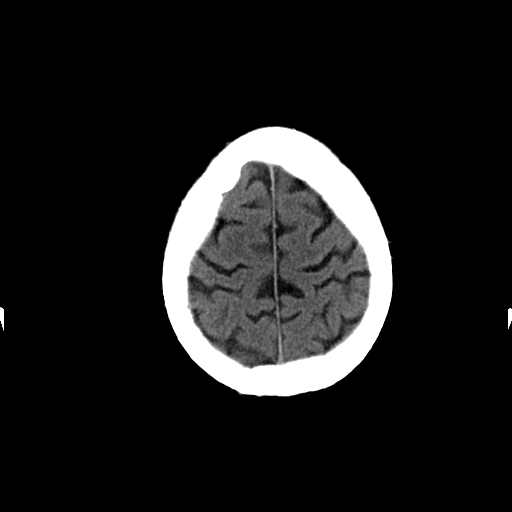
[im 27/32  brain]
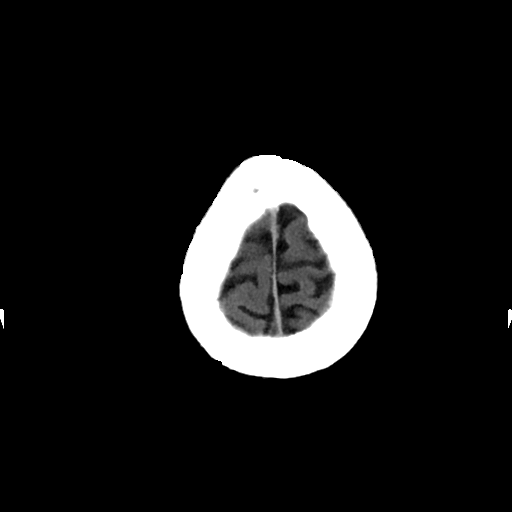
[im 29/32  brain]
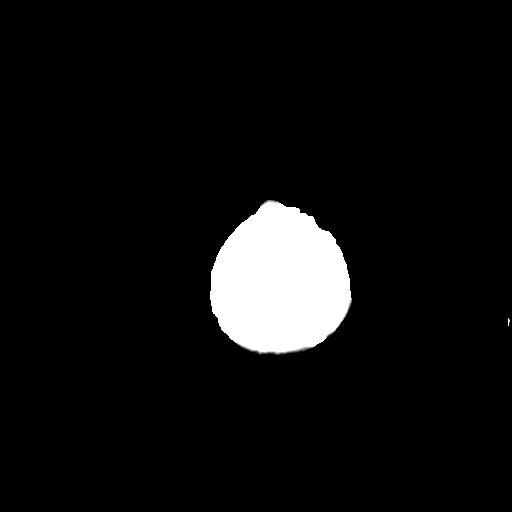
[im 29/32  bone]
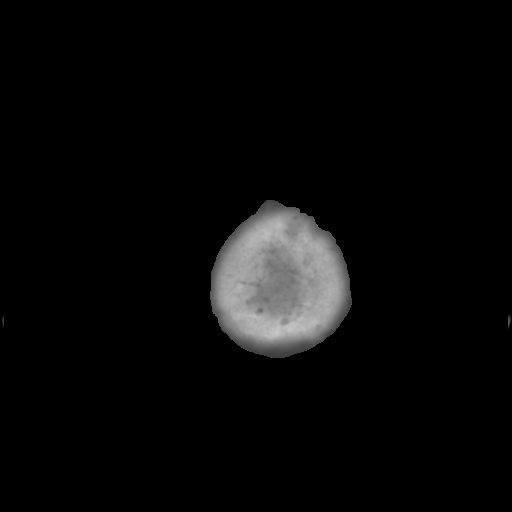

[Series 3: head w/o bone · axial · non-contrast · 0.49mm/px · z∈[+115,+135]mm · 2 of 32 slices shown]
[im 3/32  bone]
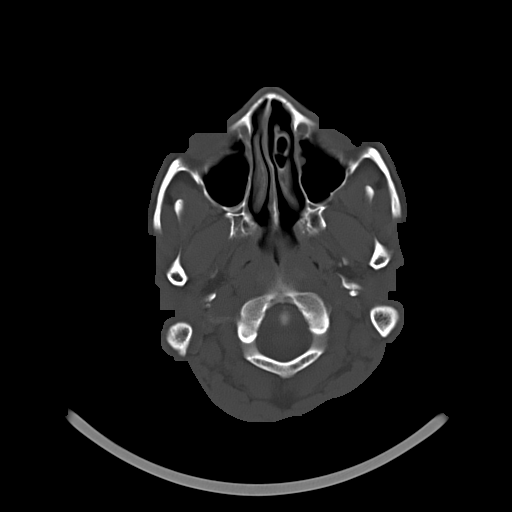
[im 7/32  bone]
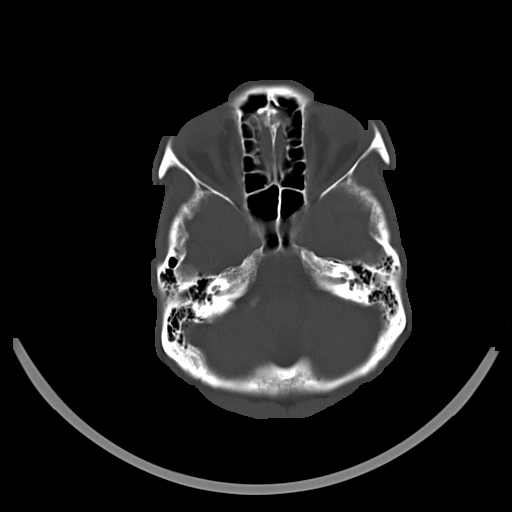

[15 of 30 positions shown; findings below may reference images not displayed]

FINDINGS: No evidence of parenchymal hemorrhage or extra-axial
fluid collection. No mass lesion, mass effect, or midline shift.

No CT evidence of acute infarction.

Stable encephalomalacic changes in the right temporal lobe. Mild
subcortical white matter and periventricular small vessel ischemic
changes.

Global cortical atrophy with secondary ventricular prominence.

The visualized paranasal sinuses are essentially clear. The mastoid
air cells are unopacified.

Soft tissue laceration with suspected sutures overlying the right
frontal bone (series 2/image 10).  No underlying osseous
abnormality.

No evidence of calvarial fracture.
IMPRESSION: Soft tissue laceration with suspected sutures overlying the right
frontal bone.  No evidence of calvarial fracture.

No acute intracranial abnormality.

Encephalomalacic changes in the right temporal lobe.  Atrophy with
small vessel ischemic changes.

## 2013-07-12 ENCOUNTER — Ambulatory Visit (INDEPENDENT_AMBULATORY_CARE_PROVIDER_SITE_OTHER): Payer: Self-pay | Admitting: Internal Medicine

## 2013-07-12 ENCOUNTER — Encounter: Payer: Self-pay | Admitting: Internal Medicine

## 2013-07-12 VITALS — BP 155/104 | HR 124 | Temp 99.9°F | Ht 65.0 in | Wt 112.5 lb

## 2013-07-12 DIAGNOSIS — E46 Unspecified protein-calorie malnutrition: Secondary | ICD-10-CM

## 2013-07-12 DIAGNOSIS — I10 Essential (primary) hypertension: Secondary | ICD-10-CM

## 2013-07-12 DIAGNOSIS — R569 Unspecified convulsions: Secondary | ICD-10-CM

## 2013-07-12 DIAGNOSIS — F17200 Nicotine dependence, unspecified, uncomplicated: Secondary | ICD-10-CM

## 2013-07-12 DIAGNOSIS — F1020 Alcohol dependence, uncomplicated: Secondary | ICD-10-CM

## 2013-07-12 MED ORDER — ATENOLOL 25 MG PO TABS: 25.0000 mg | ORAL_TABLET | Freq: Every day | ORAL | Status: DC

## 2013-07-12 MED ORDER — MAGNESIUM OXIDE 400 MG PO TABS: 400.0000 mg | ORAL_TABLET | Freq: Every day | ORAL | Status: DC

## 2013-07-12 NOTE — Assessment & Plan Note (Signed)
BP Readings from Last 3 Encounters:  07/12/13 155/104  06/07/13 114/75  05/13/13 139/90    Lab Results  Component Value Date   NA 141 05/12/2013   K 3.6* 05/12/2013   CREATININE 0.66 05/12/2013    Assessment: Blood pressure control: mildly elevated Progress toward BP goal:  deteriorated Comments: Patient not taking his medicine for 2-3 weeks  Plan: Medications:  Stop amlodipine for cost reasons and start atenolol 25 mg daily.  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

## 2013-07-12 NOTE — Assessment & Plan Note (Signed)
  Assessment: Progress toward smoking cessation:  smoking the same amount Barriers to progress toward smoking cessation:  lack of motivation to quit Comments:   Plan: Instruction/counseling given:  I counseled patient on the dangers of tobacco use, advised patient to stop smoking, and reviewed strategies to maximize success. Educational resources provided:  [facility] (1-800-QUIT-NOW) brochure Self management tools provided:    Medications to assist with smoking cessation:  None Patient agreed to the following self-care plans for smoking cessation:    Other plans:    

## 2013-07-12 NOTE — Assessment & Plan Note (Signed)
Patient's weight stable since December although his weight is still low.

## 2013-07-12 NOTE — Assessment & Plan Note (Signed)
Patient taking dilantin now and unclear whether he has true seizure disorder or alcohol withdrawal.

## 2013-07-12 NOTE — Assessment & Plan Note (Signed)
Patient was taking his magnesium pills although has been out for 2-3 weeks. Refilled at today's visit and will advise recheck of his BMP and magnesium at next visit.

## 2013-07-12 NOTE — Assessment & Plan Note (Signed)
Patient does not wish to discuss quitting today.

## 2013-07-12 NOTE — Progress Notes (Signed)
Subjective:     Patient ID: Joseph Underwood, male   DOB: 1966/02/02, 48 y.o.   MRN: [id number]  HPI The patient is a 48 YO man coming in for a follow up of an ER visit for seizure activity. He had been off his dilantin and was having shaking activity at work. He did have enough of the medicine at home and states he has been taking it reliably since that time. He reports that this episode was his most recent seizure. He is still feeling slightly lightheaded at time when standing after bending for an extended period of time but he has not passed out of fallen due to this sensation. It is able to pass after a few seconds. He is out of all his other medicines however. He is still feeling fatigued and weak.  He is still smoking about 1/2 PPD and does not wish to quit. He denies congestion, cough, SOB, chest pain, abdominal pain, nausea, vomiting, diarrhea.  Review of Systems  Constitutional: Positive for appetite change and fatigue. Negative for fever, chills and activity change.  Respiratory: Negative for cough, chest tightness, shortness of breath and wheezing.   Cardiovascular: Negative for chest pain, palpitations and leg swelling.  Gastrointestinal: Negative for nausea, vomiting, abdominal pain, diarrhea and constipation.  Genitourinary: Negative for dysuria.  Musculoskeletal: Positive for myalgias.  Neurological: Positive for dizziness, weakness and headaches. Negative for tremors, syncope, facial asymmetry, speech difficulty, light-headedness and numbness.       Objective:   Physical Exam  Constitutional: He is oriented to person, place, and time. He appears well-developed. No distress.  thin  HENT:  Head: Normocephalic and atraumatic.  Neck: Normal range of motion. Neck supple. No JVD present. No tracheal deviation present. No thyromegaly present.  Cardiovascular: Regular rhythm.   Fast rate  Pulmonary/Chest: Effort normal and breath sounds normal. No respiratory distress. He has no  wheezes. He has no rales.  Abdominal: Soft. Bowel sounds are normal. He exhibits no distension. There is no tenderness. There is no rebound.  Lymphadenopathy:    He has no cervical adenopathy.  Neurological: He is alert and oriented to person, place, and time. No cranial nerve deficit.  Skin: He is not diaphoretic.       Assessment/Plan:   1. Please see problem oriented charting.  2. Disposition - Patient will be seen back in 2-3 months. Would check magnesium level and lipid but patient declines and wishes to speak to [facility] first. Switch his amlodipine to atenolol for cheaper cost without insurance at this time. BP elevated with non-compliance with BP medications.

## 2013-07-12 NOTE — Patient Instructions (Signed)
We will switch your blood pressure medicine so that it is less expensive at your [facility]. The new medicine is called atenolol. Take 1 pill per day. You can take it in the morning or the evening. Be sure to bring the papers back for [facility] to get the orange card so we can check some bloodwork when you come back in.   Come back in about 2-3 months to check on your blood pressure.   Call the clinic with any problems before then at [phone].  Items required to complete an Eligibility Application   1. Picture ID (Can't be expired) 2. Current Bill to establish proof of residency 3. W-2 & Tax return (if self-employed include "Schedule C"), if not filing Form 4506 4. 4 current Pay stubs for this year 5. Printout of other income (Social security, unemployment, child support, workmen's comp) 6. Food stamp award letter, if receiving  7. Life Insurance (Need copy of the front page, showing name Ins Co. Name, and face amount). 8. Statement for pension, 401-K, IRS (needs to have current balance) 9. Tax Value for cars, houses, mobile homes, and land (Get from [facility] Tax Department) 10. Disability Paperwork (showing status of case) 11. College students: Print out of [place] received, tuition cost, books, etc. 12. If no Income: Games developer of support for free shelter, money, food, Social research officer, government.  Bring all that you can to your follow up appointment to start the process.

## 2013-07-15 NOTE — Progress Notes (Signed)
INTERNAL MEDICINE TEACHING ATTENDING ADDENDUM - Aldine Contes, MD: I reviewed and discussed at the time of visit with the resident Dr. Doug Sou, the patient's medical history, physical examination, diagnosis and results of tests and treatment and I agree with the patient's care as documented.

## 2013-08-14 ENCOUNTER — Encounter (HOSPITAL_COMMUNITY): Payer: Self-pay | Admitting: Emergency Medicine

## 2013-08-14 ENCOUNTER — Emergency Department (HOSPITAL_COMMUNITY): Payer: Self-pay

## 2013-08-14 ENCOUNTER — Emergency Department (HOSPITAL_COMMUNITY)
Admission: EM | Admit: 2013-08-14 | Discharge: 2013-08-14 | Disposition: A | Discharge status: Home / Self Care | Payer: Self-pay | Attending: Emergency Medicine | Admitting: Emergency Medicine

## 2013-08-14 DIAGNOSIS — G40909 Epilepsy, unspecified, not intractable, without status epilepticus: Secondary | ICD-10-CM | POA: Insufficient documentation

## 2013-08-14 DIAGNOSIS — Z79899 Other long term (current) drug therapy: Secondary | ICD-10-CM | POA: Insufficient documentation

## 2013-08-14 DIAGNOSIS — F17200 Nicotine dependence, unspecified, uncomplicated: Secondary | ICD-10-CM | POA: Insufficient documentation

## 2013-08-14 DIAGNOSIS — F10920 Alcohol use, unspecified with intoxication, uncomplicated: Secondary | ICD-10-CM

## 2013-08-14 DIAGNOSIS — I10 Essential (primary) hypertension: Secondary | ICD-10-CM | POA: Insufficient documentation

## 2013-08-14 DIAGNOSIS — F1010 Alcohol abuse, uncomplicated: Secondary | ICD-10-CM | POA: Insufficient documentation

## 2013-08-14 DIAGNOSIS — R1084 Generalized abdominal pain: Secondary | ICD-10-CM

## 2013-08-14 LAB — COMPREHENSIVE METABOLIC PANEL
ALBUMIN: 3.9 g/dL (ref 3.5–5.2)
ALT: 37 U/L (ref 0–53)
ANION GAP: 20 — AB (ref 5–15)
AST: 142 U/L — ABNORMAL HIGH (ref 0–37)
Alkaline Phosphatase: 96 U/L (ref 39–117)
BUN: 10 mg/dL (ref 6–23)
CO2: 27 mEq/L (ref 19–32)
Calcium: 9 mg/dL (ref 8.4–10.5)
Chloride: 93 mEq/L — ABNORMAL LOW (ref 96–112)
Creatinine, Ser: 0.61 mg/dL (ref 0.50–1.35)
GFR calc Af Amer: >90 mL/min (ref >90)
GFR calc non Af Amer: >90 mL/min (ref >90)
Glucose, Bld: 83 mg/dL (ref 70–99)
POTASSIUM: 3.5 meq/L — AB (ref 3.7–5.3)
SODIUM: 140 meq/L (ref 137–147)
Total Bilirubin: 0.3 mg/dL (ref 0.3–1.2)
Total Protein: 7.9 g/dL (ref 6.0–8.3)

## 2013-08-14 LAB — URINALYSIS, ROUTINE W REFLEX MICROSCOPIC
Bilirubin Urine: NEGATIVE
Glucose, UA: NEGATIVE mg/dL
Ketones, ur: NEGATIVE mg/dL
Leukocytes, UA: NEGATIVE
NITRITE: NEGATIVE
PROTEIN: NEGATIVE mg/dL
SPECIFIC GRAVITY, URINE: 1.01 (ref 1.005–1.030)
UROBILINOGEN UA: 0.2 mg/dL (ref 0.0–1.0)
pH: 6 (ref 5.0–8.0)

## 2013-08-14 LAB — CBC WITH DIFFERENTIAL/PLATELET
BASOS PCT: 3 % — AB (ref 0–1)
Basophils Absolute: 0.1 10*3/uL (ref 0.0–0.1)
Eosinophils Absolute: 0.1 10*3/uL (ref 0.0–0.7)
Eosinophils Relative: 1 % (ref 0–5)
HCT: 30.4 % — ABNORMAL LOW (ref 39.0–52.0)
Hemoglobin: 10.9 g/dL — ABNORMAL LOW (ref 13.0–17.0)
Lymphocytes Relative: 48 % — ABNORMAL HIGH (ref 12–46)
Lymphs Abs: 2.1 10*3/uL (ref 0.7–4.0)
MCH: 34.2 pg — ABNORMAL HIGH (ref 26.0–34.0)
MCHC: 35.9 g/dL (ref 30.0–36.0)
MCV: 95.3 fL (ref 78.0–100.0)
Monocytes Absolute: 0.4 10*3/uL (ref 0.1–1.0)
Monocytes Relative: 9 % (ref 3–12)
NEUTROS PCT: 40 % — AB (ref 43–77)
Neutro Abs: 1.8 10*3/uL (ref 1.7–7.7)
PLATELETS: 181 10*3/uL (ref 150–400)
RBC: 3.19 MIL/uL — ABNORMAL LOW (ref 4.22–5.81)
RDW: 14.2 % (ref 11.5–15.5)
WBC: 4.5 10*3/uL (ref 4.0–10.5)

## 2013-08-14 LAB — ETHANOL: Alcohol, Ethyl (B): 375 mg/dL — ABNORMAL HIGH (ref 0–11)

## 2013-08-14 LAB — RAPID URINE DRUG SCREEN, HOSP PERFORMED
Amphetamines: NOT DETECTED
Barbiturates: NOT DETECTED
Benzodiazepines: NOT DETECTED
Cocaine: NOT DETECTED
Opiates: NOT DETECTED
TETRAHYDROCANNABINOL: NOT DETECTED

## 2013-08-14 LAB — URINE MICROSCOPIC-ADD ON

## 2013-08-14 LAB — I-STAT CG4 LACTIC ACID, ED
Lactic Acid, Venous: 3.4 mmol/L — ABNORMAL HIGH (ref 0.5–2.2)
Lactic Acid, Venous: 3.02 mmol/L — ABNORMAL HIGH (ref 0.5–2.2)

## 2013-08-14 LAB — AMMONIA: AMMONIA: 54 umol/L (ref 11–60)

## 2013-08-14 LAB — PHENYTOIN LEVEL, TOTAL: Phenytoin Lvl: 5 ug/mL — ABNORMAL LOW (ref 10.0–20.0)

## 2013-08-14 LAB — LIPASE, BLOOD: LIPASE: 41 U/L (ref 11–59)

## 2013-08-14 MED ORDER — SODIUM CHLORIDE 0.9 % IV BOLUS (SEPSIS)
2000.0000 mL | Freq: Once | INTRAVENOUS | Status: AC
  Administered 2013-08-14: 2000 mL via INTRAVENOUS

## 2013-08-14 MED ORDER — IOHEXOL 300 MG/ML  SOLN
25.0000 mL | Freq: Once | INTRAMUSCULAR | Status: AC | PRN
  Administered 2013-08-14: 25 mL via ORAL

## 2013-08-14 MED ORDER — IOHEXOL 300 MG/ML  SOLN
100.0000 mL | Freq: Once | INTRAMUSCULAR | Status: AC | PRN
  Administered 2013-08-14: 100 mL via INTRAVENOUS

## 2013-08-14 MED ORDER — LABETALOL HCL 5 MG/ML IV SOLN
20.0000 mg | Freq: Once | INTRAVENOUS | Status: AC
  Administered 2013-08-14: 20 mg via INTRAVENOUS
  Filled 2013-08-14: qty 4

## 2013-08-14 MED ORDER — FLUORESCEIN SODIUM 1 MG OP STRP: 1.0000 | ORAL_STRIP | Freq: Once | OPHTHALMIC | Status: DC

## 2013-08-14 MED ORDER — RANITIDINE HCL 150 MG PO TABS: 150.0000 mg | ORAL_TABLET | Freq: Two times a day (BID) | ORAL | Status: DC

## 2013-08-14 MED ORDER — SUCRALFATE 1 GM/10ML PO SUSP: 1.0000 g | Freq: Three times a day (TID) | ORAL | Status: DC

## 2013-08-14 NOTE — ED Notes (Addendum)
Pt to ED via EMS with c/o abdominal cramping associated with nausea and vomiting and rigid, onset about 30 days. Pt states abdominal pain worse when working outside in heat and alleviated by rest at night. Per EMS, BP-150/105, HR-120, RR-22, SpO2-94% on room, CBG-109.

## 2013-08-14 NOTE — ED Notes (Signed)
Patient refused a wheelchair. Ambulated without any difficulty or incident.

## 2013-08-14 NOTE — ED Notes (Signed)
EDP made aware of pain 9/10 and elevated BP

## 2013-08-14 NOTE — ED Notes (Signed)
Ct Notified of patient finishing contrast.

## 2013-08-14 NOTE — ED Provider Notes (Signed)
CSN: [id number]     Arrival date & time 08/14/13  1607 History   First MD Initiated Contact with Patient 08/14/13 1611     Chief Complaint  Patient presents with  . Abdominal Pain     (Consider location/radiation/quality/duration/timing/severity/associated sxs/prior Treatment) HPI Comments: Patient presents to the ER for evaluation of abdominal pain. Patient has been having intermittent episodes of abdominal pain. Pain occurs at different areas at different times. He reports that it certainly starts as a sharp and stabbing pain. Sometimes it is in the upper abdomen, sometimes in the lower. Sometimes the left side and others the right side. He had pain that shot across his abdomen earlier today and then radiated to the right groin area. He has a constant, dull aching pain in the right lower part of the abdomen currently. He has had occasional nausea and vomiting with these symptoms. There is no diarrhea constipation.  Patient is a 48 y.o. male presenting with abdominal pain.  Abdominal Pain Associated symptoms: nausea and vomiting     Past Medical History  Diagnosis Date  . Seizures   . Alcohol abuse   . Hiccups   . HTN (hypertension)    Past Surgical History  Procedure Laterality Date  . No past surgeries     History reviewed. No pertinent family history. History  Substance Use Topics  . Smoking status: Current Every Day Smoker -- 0.50 packs/day for 25 years    Types: Cigarettes  . Smokeless tobacco: Never Used  . Alcohol Use: Yes     Comment: a fifth every other day or 4-5 beers per day    Review of Systems  Gastrointestinal: Positive for nausea, vomiting and abdominal pain.  All other systems reviewed and are negative.     Allergies  Review of patient's allergies indicates no known allergies.  Home Medications   Prior to Admission medications   Medication Sig Start Date End Date Taking? Authorizing Provider  atenolol (TENORMIN) 25 MG tablet Take 1 tablet (25 mg  total) by mouth daily. 07/12/13  Yes Olga Millers, MD  folic acid (FOLVITE) 1 MG tablet Take 1 tablet (1 mg total) by mouth daily. 04/27/13  Yes Blain Pais, MD  magnesium oxide (MAG-OX) 400 MG tablet Take 1 tablet (400 mg total) by mouth daily. 07/12/13  Yes Olga Millers, MD  phenytoin (DILANTIN) 100 MG ER capsule Take 1 capsule (100 mg total) by mouth 3 (three) times daily. 05/03/13  Yes Joni Reining, DO  ranitidine (ZANTAC) 150 MG tablet Take 1 tablet (150 mg total) by mouth 2 (two) times daily. 08/14/13   Orpah Greek, MD  sucralfate (CARAFATE) 1 GM/10ML suspension Take 10 mLs (1 g total) by mouth 4 (four) times daily -  with meals and at bedtime. 08/14/13   Orpah Greek, MD   BP 137/78  Pulse 97  Temp(Src) 98.8 F (37.1 C) (Oral)  Resp 14  SpO2 97% Physical Exam  Constitutional: He is oriented to person, place, and time. He appears well-developed and well-nourished. No distress.  HENT:  Head: Normocephalic and atraumatic.  Right Ear: Hearing normal.  Left Ear: Hearing normal.  Nose: Nose normal.  Mouth/Throat: Oropharynx is clear and moist and mucous membranes are normal.  Eyes: Conjunctivae and EOM are normal. Pupils are equal, round, and reactive to light.  Neck: Normal range of motion. Neck supple.  Cardiovascular: Regular rhythm, S1 normal and S2 normal.  Exam reveals no gallop and no friction rub.  No murmur heard. Pulmonary/Chest: Effort normal and breath sounds normal. No respiratory distress. He exhibits no tenderness.  Abdominal: Soft. Normal appearance and bowel sounds are normal. There is no hepatosplenomegaly. There is generalized tenderness. There is no rebound, no guarding, no tenderness at McBurney's point and negative Murphy's sign. No hernia.  Musculoskeletal: Normal range of motion.  Neurological: He is alert and oriented to person, place, and time. He has normal strength. No cranial nerve deficit or sensory deficit. Coordination  normal. GCS eye subscore is 4. GCS verbal subscore is 5. GCS motor subscore is 6.  Skin: Skin is warm, dry and intact. No rash noted. No cyanosis.  Psychiatric: He has a normal mood and affect. His speech is normal and behavior is normal. Thought content normal.    ED Course  Procedures (including critical care time) Labs Review Labs Reviewed  CBC WITH DIFFERENTIAL - Abnormal; Notable for the following:    RBC 3.19 (*)    Hemoglobin 10.9 (*)    HCT 30.4 (*)    MCH 34.2 (*)    Neutrophils Relative % 40 (*)    Lymphocytes Relative 48 (*)    Basophils Relative 3 (*)    All other components within normal limits  COMPREHENSIVE METABOLIC PANEL - Abnormal; Notable for the following:    Potassium 3.5 (*)    Chloride 93 (*)    AST 142 (*)    Anion gap 20 (*)    All other components within normal limits  URINALYSIS, ROUTINE W REFLEX MICROSCOPIC - Abnormal; Notable for the following:    Hgb urine dipstick TRACE (*)    All other components within normal limits  ETHANOL - Abnormal; Notable for the following:    Alcohol, Ethyl (B) 375 (*)    All other components within normal limits  PHENYTOIN LEVEL, TOTAL - Abnormal; Notable for the following:    Phenytoin Lvl 5.0 (*)    All other components within normal limits  I-STAT CG4 LACTIC ACID, ED - Abnormal; Notable for the following:    Lactic Acid, Venous 3.40 (*)    All other components within normal limits  I-STAT CG4 LACTIC ACID, ED - Abnormal; Notable for the following:    Lactic Acid, Venous 3.02 (*)    All other components within normal limits  URINE RAPID DRUG SCREEN (HOSP PERFORMED)  AMMONIA  LIPASE, BLOOD  URINE MICROSCOPIC-ADD ON    Imaging Review Ct Abdomen Pelvis W Contrast  08/14/2013   CLINICAL DATA:  Abdominal cramping with nausea and vomiting.  EXAM: CT ABDOMEN AND PELVIS WITH CONTRAST  TECHNIQUE: Multidetector CT imaging of the abdomen and pelvis was performed using the standard protocol following bolus administration of  intravenous contrast.  CONTRAST:  125mL OMNIPAQUE IOHEXOL 300 MG/ML  SOLN  COMPARISON:  Acute abdominal series of August 14, 2013 and January 01, 2010. CT scan of the abdomen and pelvis dated October 27, 2005.  FINDINGS: The liver exhibits decreased density diffusely consistent with fatty infiltrative change. The gallbladder is adequately distended with no evidence of stones or acute inflammatory changes. The pancreas, spleen, adrenal glands, and kidneys exhibit no acute abnormalities. A subcentimeter right lower pole hypodensity is most compatible with a cyst.  The stomach is partially distended and grossly normal. There is a moderate-sized hiatal hernia. The oral contrast has traversed much of the small bowel but has not yet reached the colon. There is no evidence of a small or large bowel obstruction. A normal appendix is demonstrated. There are few  colonic diverticula but there is no acute diverticulitis. The urinary bladder, prostate gland, and seminal vesicles are normal. There is no inguinal nor umbilical hernia.  There is degenerative disc space narrowing at L4-5. The bony pelvis is unremarkable. The lung bases are clear.  IMPRESSION: 1. There is no acute bowel abnormality. 2. There is no acute hepatobiliary or urinary tract abnormality. There are fatty infiltrative changes of the liver. 3. There is a moderate-sized hiatal hernia.   Electronically Signed   By: David  Martinique   On: 08/14/2013 20:08   Dg Abd Acute W/chest  08/14/2013   CLINICAL DATA:  Abdominal pain.  EXAM: ACUTE ABDOMEN SERIES (ABDOMEN 2 VIEW & CHEST 1 VIEW)  COMPARISON:  Chest radiograph 04/27/2013.  FINDINGS: Frontal view of the chest shows midline trachea. Heart size stable. Lungs are clear. No pleural fluid.  Two views of the abdomen show a fair amount of stool in the colon. No small bowel dilatation. No unexpected radiopaque calculi.  IMPRESSION: 1. No acute findings in the chest. 2. Bowel gas pattern is indicative of constipation.    Electronically Signed   By: Lorin Picket M.D.   On: 08/14/2013 17:23     EKG Interpretation None      MDM   Final diagnoses:  Generalized abdominal pain  Alcohol intoxication, uncomplicated   Patient presents to the ER for evaluation of abdominal pain. Patient had onset of diffuse abdominal pain earlier today. Abdominal exam, however, did not reveal any evidence of peritonitis. His blood work revealed mild elevation of lactate, otherwise unremarkable. He is, however, acutely intoxicated. CT scan was performed to further evaluate comment no acute abnormality was seen. Patient has improved with IV fluids and medication. I did repeat the lactate, it has gone down slightly but, still elevated at 3.  Results were discussed with the patient. He is feeling better and would like to go home. I did recommend admission to him, but he does not want to be admitted at this time. I also offered him help for his alcohol intoxication and dependence. He does not wish to undergo therapy for this at this time. Patient was therefore discharged, told to come back to the ER immediately if he has any worsening abdominal pain or has any changes in his desire for help with his alcohol abuse.    Orpah Greek, MD 08/14/13 [phone]

## 2013-08-14 NOTE — ED Notes (Signed)
MD at bedside. 

## 2013-08-14 NOTE — ED Notes (Signed)
CG-4 results reported to Dr. Betsey Holiday

## 2013-08-14 NOTE — Discharge Instructions (Signed)
Abdominal Pain °Many things can cause abdominal pain. Usually, abdominal pain is not caused by a disease and will improve without treatment. It can often be observed and treated at home. Your health care provider will do a physical exam and possibly order blood tests and X-rays to help determine the seriousness of your pain. However, in many cases, more time must pass before a clear cause of the pain can be found. Before that point, your health care provider may not know if you need more testing or further treatment. °HOME CARE INSTRUCTIONS  °Monitor your abdominal pain for any changes. The following actions may help to alleviate any discomfort you are experiencing: °· Only take over-the-counter or prescription medicines as directed by your health care provider. °· Do not take laxatives unless directed to do so by your health care provider. °· Try a clear liquid diet (broth, tea, or water) as directed by your health care provider. Slowly move to a bland diet as tolerated. °SEEK MEDICAL CARE IF: °· You have unexplained abdominal pain. °· You have abdominal pain associated with nausea or diarrhea. °· You have pain when you urinate or have a bowel movement. °· You experience abdominal pain that wakes you in the night. °· You have abdominal pain that is worsened or improved by eating food. °· You have abdominal pain that is worsened with eating fatty foods. °· You have a fever. °SEEK IMMEDIATE MEDICAL CARE IF:  °· Your pain does not go away within 2 hours. °· You keep throwing up (vomiting). °· Your pain is felt only in portions of the abdomen, such as the right side or the left lower portion of the abdomen. °· You pass bloody or black tarry stools. °MAKE SURE YOU: °· Understand these instructions.   °· Will watch your condition.   °· Will get help right away if you are not doing well or get worse.   °Document Released: 11/10/2004 Document Revised: 02/05/2013 Document Reviewed: 10/10/2012 °[employer]® Patient Information  ©2015 [employer]. This information is not intended to replace advice given to you by your health care provider. Make sure you discuss any questions you have with your health care provider. ° °Alcohol Intoxication °Alcohol intoxication occurs when the amount of alcohol that a person has consumed impairs his or her ability to mentally and physically function. Alcohol directly impairs the normal chemical activity of the brain. Drinking large amounts of alcohol can lead to changes in mental function and behavior, and it can cause many physical effects that can be harmful.  °Alcohol intoxication can range in severity from mild to very severe. Various factors can affect the level of intoxication that occurs, such as the person's age, gender, weight, frequency of alcohol consumption, and the presence of other medical conditions (such as diabetes, seizures, or heart conditions). Dangerous levels of alcohol intoxication may occur when people drink large amounts of alcohol in a short period (binge drinking). Alcohol can also be especially dangerous when combined with certain prescription medicines or "recreational" drugs. °SIGNS AND SYMPTOMS °Some common signs and symptoms of mild alcohol intoxication include: °· Loss of coordination. °· Changes in mood and behavior. °· Impaired judgment. °· Slurred speech. °As alcohol intoxication progresses to more severe levels, other signs and symptoms will appear. These may include: °· Vomiting. °· Confusion and impaired memory. °· Slowed breathing. °· Seizures. °· Loss of consciousness. °DIAGNOSIS  °Your health care provider will take a medical history and perform a physical exam. You will be asked about the amount and type   of alcohol you have consumed. Blood tests will be done to measure the concentration of alcohol in your blood. In many places, your blood alcohol level must be lower than 80 mg/dL (0.08%) to legally drive. However, many dangerous effects of alcohol can occur at  much lower levels.  °TREATMENT  °People with alcohol intoxication often do not require treatment. Most of the effects of alcohol intoxication are temporary, and they go away as the alcohol naturally leaves the body. Your health care provider will monitor your condition until you are stable enough to go home. Fluids are sometimes given through an IV access tube to help prevent dehydration.  °HOME CARE INSTRUCTIONS °· Do not drive after drinking alcohol. °· Stay hydrated. Drink enough water and fluids to keep your urine clear or pale yellow. Avoid caffeine.   °· Only take over-the-counter or prescription medicines as directed by your health care provider.   °SEEK MEDICAL CARE IF:  °· You have persistent vomiting.   °· You do not feel better after a few days. °· You have frequent alcohol intoxication. Your health care provider can help determine if you should see a substance use treatment counselor. °SEEK IMMEDIATE MEDICAL CARE IF:  °· You become shaky or tremble when you try to stop drinking.   °· You shake uncontrollably (seizure).   °· You throw up (vomit) blood. This may be bright red or may look like black coffee grounds.   °· You have blood in your stool. This may be bright red or may appear as a black, tarry, bad smelling stool.   °· You become lightheaded or faint.   °MAKE SURE YOU:  °· Understand these instructions. °· Will watch your condition. °· Will get help right away if you are not doing well or get worse. °Document Released: 11/10/2004 Document Revised: 10/03/2012 Document Reviewed: 07/06/2012 °ExitCare® Patient Information ©2015 [employer]. This information is not intended to replace advice given to you by your health care provider. Make sure you discuss any questions you have with your health care provider. ° °

## 2013-08-15 ENCOUNTER — Other Ambulatory Visit: Payer: Self-pay

## 2013-08-15 ENCOUNTER — Emergency Department (HOSPITAL_COMMUNITY)
Admission: EM | Admit: 2013-08-15 | Discharge: 2013-08-15 | Disposition: A | Discharge status: Home / Self Care | Payer: Self-pay | Attending: Emergency Medicine | Admitting: Emergency Medicine

## 2013-08-15 ENCOUNTER — Encounter (HOSPITAL_COMMUNITY): Payer: Self-pay | Admitting: Emergency Medicine

## 2013-08-15 DIAGNOSIS — R569 Unspecified convulsions: Secondary | ICD-10-CM

## 2013-08-15 DIAGNOSIS — G40909 Epilepsy, unspecified, not intractable, without status epilepticus: Secondary | ICD-10-CM | POA: Insufficient documentation

## 2013-08-15 DIAGNOSIS — I10 Essential (primary) hypertension: Secondary | ICD-10-CM | POA: Insufficient documentation

## 2013-08-15 DIAGNOSIS — Z79899 Other long term (current) drug therapy: Secondary | ICD-10-CM | POA: Insufficient documentation

## 2013-08-15 DIAGNOSIS — F17200 Nicotine dependence, unspecified, uncomplicated: Secondary | ICD-10-CM | POA: Insufficient documentation

## 2013-08-15 LAB — BASIC METABOLIC PANEL
Anion gap: 26 — ABNORMAL HIGH (ref 5–15)
BUN: 9 mg/dL (ref 6–23)
CALCIUM: 9.1 mg/dL (ref 8.4–10.5)
CO2: 25 meq/L (ref 19–32)
CREATININE: 1.18 mg/dL (ref 0.50–1.35)
Chloride: 84 mEq/L — ABNORMAL LOW (ref 96–112)
GFR calc Af Amer: 83 mL/min — ABNORMAL LOW (ref >90)
GFR calc non Af Amer: 72 mL/min — ABNORMAL LOW (ref >90)
GLUCOSE: 129 mg/dL — AB (ref 70–99)
Potassium: 3.3 mEq/L — ABNORMAL LOW (ref 3.7–5.3)
Sodium: 135 mEq/L — ABNORMAL LOW (ref 137–147)

## 2013-08-15 LAB — CBC WITH DIFFERENTIAL/PLATELET
Basophils Absolute: 0 10*3/uL (ref 0.0–0.1)
Basophils Relative: 0 % (ref 0–1)
EOS ABS: 0 10*3/uL (ref 0.0–0.7)
EOS PCT: 0 % (ref 0–5)
HEMATOCRIT: 34.6 % — AB (ref 39.0–52.0)
Hemoglobin: 12.2 g/dL — ABNORMAL LOW (ref 13.0–17.0)
LYMPHS ABS: 0.9 10*3/uL (ref 0.7–4.0)
LYMPHS PCT: 12 % (ref 12–46)
MCH: 33.6 pg (ref 26.0–34.0)
MCHC: 35.3 g/dL (ref 30.0–36.0)
MCV: 95.3 fL (ref 78.0–100.0)
MONO ABS: 0.5 10*3/uL (ref 0.1–1.0)
Monocytes Relative: 7 % (ref 3–12)
Neutro Abs: 5.7 10*3/uL (ref 1.7–7.7)
Neutrophils Relative %: 81 % — ABNORMAL HIGH (ref 43–77)
Platelets: 183 10*3/uL (ref 150–400)
RBC: 3.63 MIL/uL — AB (ref 4.22–5.81)
RDW: 14 % (ref 11.5–15.5)
WBC: 7.2 10*3/uL (ref 4.0–10.5)

## 2013-08-15 LAB — ETHANOL: Alcohol, Ethyl (B): <11 mg/dL (ref 0–11)

## 2013-08-15 MED ORDER — LORAZEPAM 2 MG/ML IJ SOLN
INTRAMUSCULAR | Status: AC
  Administered 2013-08-15: 1 mg via INTRAVENOUS
  Filled 2013-08-15: qty 1

## 2013-08-15 MED ORDER — SODIUM CHLORIDE 0.9 % IV SOLN
500.0000 mg | Freq: Once | INTRAVENOUS | Status: AC
  Administered 2013-08-15: 500 mg via INTRAVENOUS
  Filled 2013-08-15: qty 10

## 2013-08-15 MED ORDER — SODIUM CHLORIDE 0.9 % IV BOLUS (SEPSIS)
1000.0000 mL | Freq: Once | INTRAVENOUS | Status: AC
  Administered 2013-08-15: 1000 mL via INTRAVENOUS

## 2013-08-15 MED ORDER — LORAZEPAM 2 MG/ML IJ SOLN
1.0000 mg | Freq: Once | INTRAMUSCULAR | Status: AC
  Administered 2013-08-15: 1 mg via INTRAVENOUS

## 2013-08-15 NOTE — ED Provider Notes (Signed)
CSN: [id number]     Arrival date & time 08/15/13  1119 History   First MD Initiated Contact with Patient 08/15/13 1124     Chief Complaint  Patient presents with  . Seizures  . Fall     (Consider location/radiation/quality/duration/timing/severity/associated sxs/prior Treatment) HPI Comments: Patient is a 48 year old male with a past medical history of seizure disorder, alcohol dependence and hypertension who presents via EMS after a seizure that occurred prior to arrival. Patient reports getting in his work truck when he started seizing. Patient woke up to EMS standing over him. Bystanders say he seized for about 5 minutes. EMS states he was post-ictal on arrival. He denies any pain at this time. Patient reports taking dilantin for seizures but ran out 3 days ago and hasn't taken his medication since then. No aggravating/alleviating factors. No other associated symptoms. Patient last drank alcohol yesterday which he states was 4 beers. Last seizure about 3 months ago.    Past Medical History  Diagnosis Date  . Seizures   . Alcohol abuse   . Hiccups   . HTN (hypertension)    Past Surgical History  Procedure Laterality Date  . No past surgeries     History reviewed. No pertinent family history. History  Substance Use Topics  . Smoking status: Current Every Day Smoker -- 0.50 packs/day for 25 years    Types: Cigarettes  . Smokeless tobacco: Never Used  . Alcohol Use: Yes     Comment: a fifth every other day or 4-5 beers per day    Review of Systems  Constitutional: Negative for fever, chills and fatigue.  HENT: Negative for trouble swallowing.   Eyes: Negative for visual disturbance.  Respiratory: Negative for shortness of breath.   Cardiovascular: Negative for chest pain and palpitations.  Gastrointestinal: Negative for nausea, vomiting, abdominal pain and diarrhea.  Genitourinary: Negative for dysuria and difficulty urinating.  Musculoskeletal: Negative for arthralgias and  neck pain.  Skin: Negative for color change.  Neurological: Positive for seizures. Negative for dizziness and weakness.  Psychiatric/Behavioral: Negative for dysphoric mood.      Allergies  Review of patient's allergies indicates no known allergies.  Home Medications   Prior to Admission medications   Medication Sig Start Date End Date Taking? Authorizing Provider  atenolol (TENORMIN) 25 MG tablet Take 1 tablet (25 mg total) by mouth daily. 07/12/13   Olga Millers, MD  folic acid (FOLVITE) 1 MG tablet Take 1 tablet (1 mg total) by mouth daily. 04/27/13   Blain Pais, MD  magnesium oxide (MAG-OX) 400 MG tablet Take 1 tablet (400 mg total) by mouth daily. 07/12/13   Olga Millers, MD  phenytoin (DILANTIN) 100 MG ER capsule Take 1 capsule (100 mg total) by mouth 3 (three) times daily. 05/03/13   Joni Reining, DO  ranitidine (ZANTAC) 150 MG tablet Take 1 tablet (150 mg total) by mouth 2 (two) times daily. 08/14/13   Orpah Greek, MD  sucralfate (CARAFATE) 1 GM/10ML suspension Take 10 mLs (1 g total) by mouth 4 (four) times daily -  with meals and at bedtime. 08/14/13   Orpah Greek, MD   BP 124/84  Pulse 112  Temp(Src) 98.2 F (36.8 C) (Oral)  Resp 18  Ht 5\' 5"  (1.651 m)  Wt 115 lb (52.164 kg)  BMI 19.14 kg/m2  SpO2 95% Physical Exam  Nursing note and vitals reviewed. Constitutional: He is oriented to person, place, and time. He appears well-developed and  well-nourished. No distress.  HENT:  Head: Normocephalic and atraumatic.  Mouth/Throat: Oropharynx is clear and moist. No oropharyngeal exudate.  Eyes: Conjunctivae and EOM are normal. Pupils are equal, round, and reactive to light.  Neck: Normal range of motion.  Cardiovascular: Normal rate and regular rhythm.  Exam reveals no gallop and no friction rub.   No murmur heard. Pulmonary/Chest: Effort normal and breath sounds normal. He has no wheezes. He has no rales. He exhibits no tenderness.   Abdominal: Soft. He exhibits no distension. There is no tenderness. There is no rebound.  Musculoskeletal: Normal range of motion.  No midline spine tenderness to palpation.   Neurological: He is alert and oriented to person, place, and time. No cranial nerve deficit. Coordination normal.  Extremity strength and sensation equal and intact bilaterally. Speech is goal-oriented. Moves limbs without ataxia.   Skin: Skin is warm and dry.  Psychiatric: He has a normal mood and affect. His behavior is normal.    ED Course  Procedures (including critical care time) Labs Review Labs Reviewed  CBC WITH DIFFERENTIAL - Abnormal; Notable for the following:    RBC 3.63 (*)    Hemoglobin 12.2 (*)    HCT 34.6 (*)    Neutrophils Relative % 81 (*)    All other components within normal limits  BASIC METABOLIC PANEL - Abnormal; Notable for the following:    Sodium 135 (*)    Potassium 3.3 (*)    Chloride 84 (*)    Glucose, Bld 129 (*)    GFR calc non Af Amer 72 (*)    GFR calc Af Amer 83 (*)    Anion gap 26 (*)    All other components within normal limits  ETHANOL  PHENYTOIN LEVEL, FREE    Imaging Review Ct Abdomen Pelvis W Contrast  08/14/2013   CLINICAL DATA:  Abdominal cramping with nausea and vomiting.  EXAM: CT ABDOMEN AND PELVIS WITH CONTRAST  TECHNIQUE: Multidetector CT imaging of the abdomen and pelvis was performed using the standard protocol following bolus administration of intravenous contrast.  CONTRAST:  129mL OMNIPAQUE IOHEXOL 300 MG/ML  SOLN  COMPARISON:  Acute abdominal series of August 14, 2013 and January 01, 2010. CT scan of the abdomen and pelvis dated October 27, 2005.  FINDINGS: The liver exhibits decreased density diffusely consistent with fatty infiltrative change. The gallbladder is adequately distended with no evidence of stones or acute inflammatory changes. The pancreas, spleen, adrenal glands, and kidneys exhibit no acute abnormalities. A subcentimeter right lower pole  hypodensity is most compatible with a cyst.  The stomach is partially distended and grossly normal. There is a moderate-sized hiatal hernia. The oral contrast has traversed much of the small bowel but has not yet reached the colon. There is no evidence of a small or large bowel obstruction. A normal appendix is demonstrated. There are few colonic diverticula but there is no acute diverticulitis. The urinary bladder, prostate gland, and seminal vesicles are normal. There is no inguinal nor umbilical hernia.  There is degenerative disc space narrowing at L4-5. The bony pelvis is unremarkable. The lung bases are clear.  IMPRESSION: 1. There is no acute bowel abnormality. 2. There is no acute hepatobiliary or urinary tract abnormality. There are fatty infiltrative changes of the liver. 3. There is a moderate-sized hiatal hernia.   Electronically Signed   By: David  Martinique   On: 08/14/2013 20:08   Dg Abd Acute W/chest  08/14/2013   CLINICAL DATA:  Abdominal pain.  EXAM: ACUTE ABDOMEN SERIES (ABDOMEN 2 VIEW & CHEST 1 VIEW)  COMPARISON:  Chest radiograph 04/27/2013.  FINDINGS: Frontal view of the chest shows midline trachea. Heart size stable. Lungs are clear. No pleural fluid.  Two views of the abdomen show a fair amount of stool in the colon. No small bowel dilatation. No unexpected radiopaque calculi.  IMPRESSION: 1. No acute findings in the chest. 2. Bowel gas pattern is indicative of constipation.   Electronically Signed   By: Lorin Picket M.D.   On: 08/14/2013 17:23     EKG Interpretation None      MDM   Final diagnoses:  Seizure    11:36 AM Labs pending. Patient is tachycardic with remaining vitals stable.   5:35 PM Patient had a witnessed seizure here in the ED around 3:45pm. Patient given 1mg  ativan. Patient currently receiving dilantin due to suspected low levels. Labs unremarkable for acute changes. Patient will continue to be monitored here in the ED.   8:46 PM Patient had no more  seizure activity here in the ED after receiving dilantin. Patient has a prescription for dilantin at home that he is instructed to fill and take as directed. Patient instructed to return with worsening or concerning symptoms.    Alvina Chou, [place] 08/15/13 2047

## 2013-08-15 NOTE — ED Notes (Addendum)
Red top drawn and sent down to main lab per lab request.

## 2013-08-15 NOTE — ED Notes (Signed)
Per EMS: Pt was about to get on work truck when he fell to the ground and had witnessed 5 minute seizure. Pt denies any head or neck pain, EMS states pt passed [facility] exam upon arrival. EMS states pt has been post ictal en route, upon arrival to ED pt axo x4. Pt states he takes dilantin for seizures but has been out for  3 days. nad noted. Pt diaphoretic and alert.

## 2013-08-15 NOTE — Discharge Planning (Signed)
[place] with patient about primary care resources and establishing care with a provider. Patient was given the orange card application and instructed to contact me once application was complete. Resource guide and my contact information was provided for any future questions or concerns. No other needs expressed at this time.

## 2013-08-15 NOTE — Discharge Instructions (Signed)
Take you dilantin as directed. Follow up with your doctor. Return to the ED with worsening or concerning symptoms. Refer to attached documents for more information.

## 2013-08-15 NOTE — ED Notes (Addendum)
Main lab called to follow up on phenytoin level, states blood work is sent off to [address], will result later in the day.

## 2013-08-15 NOTE — ED Notes (Signed)
Pt actively having seizure, HR 150 pt contracted and non verbal. Dr. Verner Chol and Verline Lema, PA at bedside. See MAR for ativan order. Suction applied, 02 [place] at 2L applied.

## 2013-08-16 NOTE — ED Provider Notes (Signed)
Medical screening examination/treatment/procedure(s) were conducted as a shared visit with non-physician practitioner(s) and myself.  I personally evaluated the patient during the encounter.   EKG Interpretation   Date/Time:  Thursday August 15 2013 11:29:12 EDT Ventricular Rate:  113 PR Interval:  139 QRS Duration: 68 QT Interval:  362 QTC Calculation: 496 R Axis:   90 Text Interpretation:  Sinus tachycardia Biatrial enlargement Left  ventricular hypertrophy Anterior infarct, old no significant change since  04/2013 Confirmed by Bowie Doiron  MD, Eimi Viney ([id number]) on 08/15/2013 12:55:15 PM       Patient with seizure disorder with known noncompliance. Here had seizure but had not started dilantin load yet. Stable after seizure. D/c with oral meds.  Ephraim Hamburger, MD 08/16/13 1336

## 2013-08-20 LAB — PHENYTOIN LEVEL, FREE AND TOTAL
Phenytoin, Free: <0.5 mg/L (ref 1.0–2.0)
Phenytoin, Total: <2.5 mg/L (ref 10.0–20.0)

## 2013-08-26 ENCOUNTER — Ambulatory Visit: Payer: Self-pay

## 2013-10-28 ENCOUNTER — Encounter (HOSPITAL_COMMUNITY): Payer: Self-pay | Admitting: Emergency Medicine

## 2013-10-28 ENCOUNTER — Emergency Department (HOSPITAL_COMMUNITY)
Admission: EM | Admit: 2013-10-28 | Discharge: 2013-10-28 | Disposition: A | Discharge status: Home / Self Care | Payer: Self-pay | Attending: Emergency Medicine | Admitting: Emergency Medicine

## 2013-10-28 DIAGNOSIS — R112 Nausea with vomiting, unspecified: Secondary | ICD-10-CM | POA: Insufficient documentation

## 2013-10-28 DIAGNOSIS — F17200 Nicotine dependence, unspecified, uncomplicated: Secondary | ICD-10-CM | POA: Insufficient documentation

## 2013-10-28 DIAGNOSIS — G40909 Epilepsy, unspecified, not intractable, without status epilepticus: Secondary | ICD-10-CM | POA: Insufficient documentation

## 2013-10-28 DIAGNOSIS — R42 Dizziness and giddiness: Secondary | ICD-10-CM | POA: Insufficient documentation

## 2013-10-28 DIAGNOSIS — R0682 Tachypnea, not elsewhere classified: Secondary | ICD-10-CM | POA: Insufficient documentation

## 2013-10-28 DIAGNOSIS — R000 Tachycardia, unspecified: Secondary | ICD-10-CM | POA: Insufficient documentation

## 2013-10-28 DIAGNOSIS — I10 Essential (primary) hypertension: Secondary | ICD-10-CM | POA: Insufficient documentation

## 2013-10-28 DIAGNOSIS — F1010 Alcohol abuse, uncomplicated: Secondary | ICD-10-CM | POA: Insufficient documentation

## 2013-10-28 DIAGNOSIS — R110 Nausea: Secondary | ICD-10-CM

## 2013-10-28 DIAGNOSIS — Z79899 Other long term (current) drug therapy: Secondary | ICD-10-CM | POA: Insufficient documentation

## 2013-10-28 LAB — COMPREHENSIVE METABOLIC PANEL
ALK PHOS: 172 U/L — AB (ref 39–117)
ALT: 80 U/L — AB (ref 0–53)
AST: 294 U/L — ABNORMAL HIGH (ref 0–37)
Albumin: 4.1 g/dL (ref 3.5–5.2)
Anion gap: 22 — ABNORMAL HIGH (ref 5–15)
BUN: 11 mg/dL (ref 6–23)
CALCIUM: 9.2 mg/dL (ref 8.4–10.5)
CO2: 31 mEq/L (ref 19–32)
Chloride: 82 mEq/L — ABNORMAL LOW (ref 96–112)
Creatinine, Ser: 0.66 mg/dL (ref 0.50–1.35)
GFR calc Af Amer: >90 mL/min (ref >90)
GFR calc non Af Amer: >90 mL/min (ref >90)
Glucose, Bld: 104 mg/dL — ABNORMAL HIGH (ref 70–99)
POTASSIUM: 3.7 meq/L (ref 3.7–5.3)
Sodium: 135 mEq/L — ABNORMAL LOW (ref 137–147)
TOTAL PROTEIN: 8.6 g/dL — AB (ref 6.0–8.3)
Total Bilirubin: 0.9 mg/dL (ref 0.3–1.2)

## 2013-10-28 LAB — CBC WITH DIFFERENTIAL/PLATELET
BASOS ABS: 0.1 10*3/uL (ref 0.0–0.1)
Basophils Relative: 1 % (ref 0–1)
EOS PCT: 0 % (ref 0–5)
Eosinophils Absolute: 0 10*3/uL (ref 0.0–0.7)
HCT: 35.3 % — ABNORMAL LOW (ref 39.0–52.0)
Hemoglobin: 12.6 g/dL — ABNORMAL LOW (ref 13.0–17.0)
LYMPHS PCT: 8 % — AB (ref 12–46)
Lymphs Abs: 0.7 10*3/uL (ref 0.7–4.0)
MCH: 33.6 pg (ref 26.0–34.0)
MCHC: 35.7 g/dL (ref 30.0–36.0)
MCV: 94.1 fL (ref 78.0–100.0)
Monocytes Absolute: 0.8 10*3/uL (ref 0.1–1.0)
Monocytes Relative: 8 % (ref 3–12)
NEUTROS PCT: 83 % — AB (ref 43–77)
Neutro Abs: 8.4 10*3/uL — ABNORMAL HIGH (ref 1.7–7.7)
PLATELETS: 169 10*3/uL (ref 150–400)
RBC: 3.75 MIL/uL — AB (ref 4.22–5.81)
RDW: 13.7 % (ref 11.5–15.5)
WBC: 9.9 10*3/uL (ref 4.0–10.5)

## 2013-10-28 LAB — ETHANOL: Alcohol, Ethyl (B): <11 mg/dL (ref 0–11)

## 2013-10-28 MED ORDER — AMLODIPINE BESYLATE 10 MG PO TABS
10.0000 mg | ORAL_TABLET | Freq: Every day | ORAL | Status: DC
Start: 2013-10-28 — End: 2014-01-29

## 2013-10-28 MED ORDER — FOLIC ACID 1 MG PO TABS: 1.0000 mg | ORAL_TABLET | Freq: Every day | ORAL | Status: DC

## 2013-10-28 MED ORDER — LORAZEPAM 2 MG/ML IJ SOLN
1.0000 mg | Freq: Once | INTRAMUSCULAR | Status: AC
  Administered 2013-10-28: 1 mg via INTRAVENOUS
  Filled 2013-10-28: qty 1

## 2013-10-28 MED ORDER — SODIUM CHLORIDE 0.9 % IV BOLUS (SEPSIS)
1000.0000 mL | Freq: Once | INTRAVENOUS | Status: AC
  Administered 2013-10-28: 1000 mL via INTRAVENOUS

## 2013-10-28 MED ORDER — PHENYTOIN SODIUM EXTENDED 100 MG PO CAPS: 100.0000 mg | ORAL_CAPSULE | Freq: Three times a day (TID) | ORAL | Status: DC

## 2013-10-28 MED ORDER — AMLODIPINE BESYLATE 5 MG PO TABS
10.0000 mg | ORAL_TABLET | Freq: Once | ORAL | Status: AC
  Administered 2013-10-28: 10 mg via ORAL
  Filled 2013-10-28: qty 2

## 2013-10-28 MED ORDER — ONDANSETRON 8 MG PO TBDP: 8.0000 mg | ORAL_TABLET | Freq: Once | ORAL | Status: DC

## 2013-10-28 NOTE — Discharge Instructions (Signed)
As discussed, it is important that you follow up as soon as possible with your physician for continued management of your condition.  Please dictate alcohol only in moderation, and follow up with your physician to discuss options for alcohol abuse counseling.  If you develop any new, or concerning changes in your condition, please return to the emergency department immediately.

## 2013-10-28 NOTE — ED Notes (Signed)
Pt alert and oriented at discharge.  Pt reviewed and verbalized understanding of discharge instructions.  Pt called family to come get him from the ED.

## 2013-10-28 NOTE — ED Provider Notes (Signed)
CSN: [id number]     Arrival date & time 10/28/13  0920 History   First MD Initiated Contact with Patient 10/28/13 [phone]     Chief Complaint  Patient presents with  . Dizziness  . Nausea     (Consider location/radiation/quality/duration/timing/severity/associated sxs/prior Treatment) HPI Patient presents with dizziness, chills, nausea, vomiting. Symptoms began approximately 3 hours ago. Since onset symptoms have been persistent, with no relief from anything. There is no focal pain in the chest, there is mild upper abdominal discomfort. Patients with hiccups, starting yesterday. Patient's multiple medical problems, including seizure disorder, alcohol abuse, hypertension.  On patient states that he drinks 1/5 alcohol daily or every other day.  Past Medical History  Diagnosis Date  . Seizures   . Alcohol abuse   . Hiccups   . HTN (hypertension)    Past Surgical History  Procedure Laterality Date  . No past surgeries     No family history on file. History  Substance Use Topics  . Smoking status: Current Every Day Smoker -- 0.50 packs/day for 25 years    Types: Cigarettes  . Smokeless tobacco: Never Used  . Alcohol Use: Yes     Comment: a fifth every other day or 4-5 beers per day    Review of Systems  Constitutional:       Per HPI, otherwise negative  HENT:       Per HPI, otherwise negative  Respiratory:       Per HPI, otherwise negative  Cardiovascular:       Per HPI, otherwise negative  Gastrointestinal: Positive for nausea and vomiting.  Endocrine:       Negative aside from HPI  Genitourinary:       Neg aside from HPI   Musculoskeletal:       Per HPI, otherwise negative  Skin: Negative.   Neurological: Negative for syncope.      Allergies  Review of patient's allergies indicates no known allergies.  Home Medications   Prior to Admission medications   Medication Sig Start Date End Date Taking? Authorizing Provider  amLODipine (NORVASC) 10 MG tablet  Take 10 mg by mouth daily.   Yes Historical Provider, MD  folic acid (FOLVITE) 1 MG tablet Take 1 tablet (1 mg total) by mouth daily. 04/27/13  Yes Blain Pais, MD  phenytoin (DILANTIN) 100 MG ER capsule Take 1 capsule (100 mg total) by mouth 3 (three) times daily. 05/03/13  Yes Lucious Groves, DO  ondansetron (ZOFRAN-ODT) 8 MG disintegrating tablet Take 8 mg by mouth once.    Historical Provider, MD   BP 159/131  Pulse 123  Resp 19  Ht 5\' 5"  (1.651 m)  Wt 112 lb (50.803 kg)  BMI 18.64 kg/m2  SpO2 100% Physical Exam  Nursing note and vitals reviewed. Constitutional: He is oriented to person, place, and time. He appears well-developed. No distress.  HENT:  Head: Normocephalic and atraumatic.  Eyes: Conjunctivae and EOM are normal.  Cardiovascular: Regular rhythm.  Tachycardia present.   Pulmonary/Chest: No stridor. Tachypnea noted. No respiratory distress.  Abdominal: He exhibits no distension.  Soft, non-peritoneal abdomen, minimal tenderness to palpation about the epigastric area  Musculoskeletal: He exhibits no edema.  Neurological: He is alert and oriented to person, place, and time.  Skin: Skin is warm and dry.  Psychiatric: He has a normal mood and affect.    ED Course  Procedures (including critical care time) Labs Review Labs Reviewed  CBC WITH DIFFERENTIAL  COMPREHENSIVE METABOLIC PANEL  ETHANOL    Imaging Review No results found.  I reviewed the EMR patient in CT scan abdomen pelvis, within the past few months.  Pulse oximetry 97% room air normal  2:34 PM Patient awake and alert, asymptomatic. He and I had a lengthy conversation about his lab results, including evidence for liver dysfunction. I offered to provide resources for counseling for alcohol abuse. The patient deferred this recommendation. We also discussed the patient's need to take all medication as directed, and he states that he has not taken his blood medication in some time. Patient  requested refill of all blood pressure medications.  This will be accommodated.  I encouraged the patient to abstain from alcohol use, and to be sure to follow up with his primary care physician.   MDM   Patient presents with dizziness, chills, nausea. Notably, on exam the patient is awake and alert, hiccuping, with tachycardia, but interacting appropriately, with no evidence of distress, nor alcohol withdrawal. Of patient is negligible alcohol level, and given his endorsement of drinking substantial amounts, his symptoms may represent transient alcohol withdrawal. Patient also has been noncompliant with all medications, including antihypertensive medication, antiseizure medication. Patient received refills for all of his medication. After several hours of monitoring in the emergency department, with no decompensation, the patient remained hypertensive, mildly tachycardic, and with the patient's referral of recommendation for assistance with alcohol cessation, the patient was discharged to follow up with primary care.    Carmin Muskrat, MD 10/28/13 [phone]

## 2013-10-28 NOTE — ED Notes (Signed)
EMS - Pt coming from home with c/o of awakening with dizzyiness, chills, nausea and vomiting that started around 07:30 this morning.  170/110 BP, 130 pulse, 156 CBG, denies pain.  Given 8mg  Zofran.  Pt was able to walk from the stretcher to the bed x 1 assist.  Pt has persistent hiccups that started yesterday.

## 2013-12-16 ENCOUNTER — Encounter (HOSPITAL_COMMUNITY): Payer: Self-pay | Admitting: *Deleted

## 2013-12-16 ENCOUNTER — Emergency Department (HOSPITAL_COMMUNITY): Payer: Self-pay

## 2013-12-16 ENCOUNTER — Inpatient Hospital Stay (HOSPITAL_COMMUNITY)
Admission: EM | Admit: 2013-12-16 | Discharge: 2013-12-18 | DRG: 896 | Disposition: A | Discharge status: Home / Self Care | Payer: Self-pay | Attending: Infectious Diseases | Admitting: Infectious Diseases

## 2013-12-16 DIAGNOSIS — Z720 Tobacco use: Secondary | ICD-10-CM | POA: Diagnosis present

## 2013-12-16 DIAGNOSIS — R569 Unspecified convulsions: Secondary | ICD-10-CM

## 2013-12-16 DIAGNOSIS — F10288 Alcohol dependence with other alcohol-induced disorder: Secondary | ICD-10-CM

## 2013-12-16 DIAGNOSIS — E872 Acidosis: Secondary | ICD-10-CM | POA: Diagnosis present

## 2013-12-16 DIAGNOSIS — Z9119 Patient's noncompliance with other medical treatment and regimen: Secondary | ICD-10-CM | POA: Diagnosis present

## 2013-12-16 DIAGNOSIS — F1010 Alcohol abuse, uncomplicated: Secondary | ICD-10-CM | POA: Diagnosis present

## 2013-12-16 DIAGNOSIS — F1020 Alcohol dependence, uncomplicated: Secondary | ICD-10-CM | POA: Diagnosis present

## 2013-12-16 DIAGNOSIS — F10939 Alcohol use, unspecified with withdrawal, unspecified: Secondary | ICD-10-CM | POA: Insufficient documentation

## 2013-12-16 DIAGNOSIS — I10 Essential (primary) hypertension: Secondary | ICD-10-CM | POA: Diagnosis present

## 2013-12-16 DIAGNOSIS — F10231 Alcohol dependence with withdrawal delirium: Principal | ICD-10-CM | POA: Diagnosis present

## 2013-12-16 DIAGNOSIS — F10188 Alcohol abuse with other alcohol-induced disorder: Secondary | ICD-10-CM

## 2013-12-16 DIAGNOSIS — Z681 Body mass index (BMI) 19 or less, adult: Secondary | ICD-10-CM

## 2013-12-16 DIAGNOSIS — F17210 Nicotine dependence, cigarettes, uncomplicated: Secondary | ICD-10-CM | POA: Diagnosis present

## 2013-12-16 DIAGNOSIS — F10931 Alcohol use, unspecified with withdrawal delirium: Secondary | ICD-10-CM

## 2013-12-16 DIAGNOSIS — Z8673 Personal history of transient ischemic attack (TIA), and cerebral infarction without residual deficits: Secondary | ICD-10-CM

## 2013-12-16 DIAGNOSIS — E873 Alkalosis: Secondary | ICD-10-CM | POA: Diagnosis present

## 2013-12-16 DIAGNOSIS — F17200 Nicotine dependence, unspecified, uncomplicated: Secondary | ICD-10-CM

## 2013-12-16 DIAGNOSIS — E43 Unspecified severe protein-calorie malnutrition: Secondary | ICD-10-CM | POA: Diagnosis present

## 2013-12-16 DIAGNOSIS — G9389 Other specified disorders of brain: Secondary | ICD-10-CM | POA: Diagnosis present

## 2013-12-16 DIAGNOSIS — E876 Hypokalemia: Secondary | ICD-10-CM | POA: Diagnosis present

## 2013-12-16 DIAGNOSIS — E440 Moderate protein-calorie malnutrition: Secondary | ICD-10-CM | POA: Insufficient documentation

## 2013-12-16 DIAGNOSIS — G40909 Epilepsy, unspecified, not intractable, without status epilepticus: Secondary | ICD-10-CM | POA: Diagnosis present

## 2013-12-16 DIAGNOSIS — J069 Acute upper respiratory infection, unspecified: Secondary | ICD-10-CM | POA: Diagnosis present

## 2013-12-16 DIAGNOSIS — R058 Other specified cough: Secondary | ICD-10-CM

## 2013-12-16 DIAGNOSIS — F10239 Alcohol dependence with withdrawal, unspecified: Secondary | ICD-10-CM

## 2013-12-16 DIAGNOSIS — W19XXXA Unspecified fall, initial encounter: Secondary | ICD-10-CM

## 2013-12-16 DIAGNOSIS — R05 Cough: Secondary | ICD-10-CM

## 2013-12-16 HISTORY — DX: Unspecified convulsions: R56.9

## 2013-12-16 LAB — BASIC METABOLIC PANEL
Anion gap: 21 — ABNORMAL HIGH (ref 5–15)
BUN: 9 mg/dL (ref 6–23)
CALCIUM: 9.3 mg/dL (ref 8.4–10.5)
CO2: 26 meq/L (ref 19–32)
CREATININE: 1.26 mg/dL (ref 0.50–1.35)
Chloride: 89 mEq/L — ABNORMAL LOW (ref 96–112)
GFR calc Af Amer: 77 mL/min — ABNORMAL LOW (ref >90)
GFR calc non Af Amer: 66 mL/min — ABNORMAL LOW (ref >90)
GLUCOSE: 102 mg/dL — AB (ref 70–99)
Potassium: 4.1 mEq/L (ref 3.7–5.3)
Sodium: 136 mEq/L — ABNORMAL LOW (ref 137–147)

## 2013-12-16 LAB — CBC WITH DIFFERENTIAL/PLATELET
Basophils Absolute: 0 10*3/uL (ref 0.0–0.1)
Basophils Relative: 1 % (ref 0–1)
EOS PCT: 1 % (ref 0–5)
Eosinophils Absolute: 0 10*3/uL (ref 0.0–0.7)
HCT: 35.1 % — ABNORMAL LOW (ref 39.0–52.0)
Hemoglobin: 12.5 g/dL — ABNORMAL LOW (ref 13.0–17.0)
LYMPHS ABS: 0.8 10*3/uL (ref 0.7–4.0)
LYMPHS PCT: 13 % (ref 12–46)
MCH: 33.6 pg (ref 26.0–34.0)
MCHC: 35.6 g/dL (ref 30.0–36.0)
MCV: 94.4 fL (ref 78.0–100.0)
MONO ABS: 0.4 10*3/uL (ref 0.1–1.0)
Monocytes Relative: 7 % (ref 3–12)
Neutro Abs: 4.8 10*3/uL (ref 1.7–7.7)
Neutrophils Relative %: 78 % — ABNORMAL HIGH (ref 43–77)
Platelets: 184 10*3/uL (ref 150–400)
RBC: 3.72 MIL/uL — AB (ref 4.22–5.81)
RDW: 14.1 % (ref 11.5–15.5)
WBC: 6.1 10*3/uL (ref 4.0–10.5)

## 2013-12-16 LAB — PHENYTOIN LEVEL, TOTAL: Phenytoin Lvl: 4.8 ug/mL — ABNORMAL LOW (ref 10.0–20.0)

## 2013-12-16 LAB — ETHANOL: Alcohol, Ethyl (B): <11 mg/dL (ref 0–11)

## 2013-12-16 MED ORDER — SODIUM CHLORIDE 0.9 % IV BOLUS (SEPSIS)
1000.0000 mL | Freq: Once | INTRAVENOUS | Status: AC
  Administered 2013-12-16: 1000 mL via INTRAVENOUS

## 2013-12-16 MED ORDER — FOLIC ACID 1 MG PO TABS
1.0000 mg | ORAL_TABLET | Freq: Every day | ORAL | Status: DC
  Administered 2013-12-18: 1 mg via ORAL
  Filled 2013-12-16 (×2): qty 1

## 2013-12-16 MED ORDER — LORAZEPAM 1 MG PO TABS
0.0000 mg | ORAL_TABLET | Freq: Two times a day (BID) | ORAL | Status: DC
  Filled 2013-12-16: qty 1

## 2013-12-16 MED ORDER — THIAMINE HCL 100 MG/ML IJ SOLN
100.0000 mg | Freq: Every day | INTRAMUSCULAR | Status: DC
  Administered 2013-12-17: 100 mg via INTRAVENOUS
  Filled 2013-12-16 (×2): qty 1

## 2013-12-16 MED ORDER — SODIUM CHLORIDE 0.9 % IV SOLN
Freq: Once | INTRAVENOUS | Status: AC
  Administered 2013-12-16: 17:00:00 via INTRAVENOUS
  Filled 2013-12-16: qty 1000

## 2013-12-16 MED ORDER — VITAMIN B-1 100 MG PO TABS
100.0000 mg | ORAL_TABLET | Freq: Every day | ORAL | Status: DC
  Administered 2013-12-18: 100 mg via ORAL
  Filled 2013-12-16 (×2): qty 1

## 2013-12-16 MED ORDER — PHENYTOIN SODIUM EXTENDED 100 MG PO CAPS
100.0000 mg | ORAL_CAPSULE | Freq: Three times a day (TID) | ORAL | Status: DC
  Administered 2013-12-17 (×2): 100 mg via ORAL
  Filled 2013-12-16 (×7): qty 1

## 2013-12-16 MED ORDER — SODIUM CHLORIDE 0.9 % IV SOLN
1000.0000 mg | Freq: Once | INTRAVENOUS | Status: AC
  Administered 2013-12-16: 1000 mg via INTRAVENOUS
  Filled 2013-12-16: qty 20

## 2013-12-16 MED ORDER — SODIUM CHLORIDE 0.9 % IJ SOLN
3.0000 mL | Freq: Two times a day (BID) | INTRAMUSCULAR | Status: DC
  Administered 2013-12-17: 3 mL via INTRAVENOUS

## 2013-12-16 MED ORDER — THIAMINE HCL 100 MG/ML IJ SOLN
100.0000 mg | Freq: Every day | INTRAMUSCULAR | Status: DC
  Administered 2013-12-16: 100 mg via INTRAVENOUS
  Filled 2013-12-16: qty 2

## 2013-12-16 MED ORDER — LORAZEPAM 2 MG/ML IJ SOLN
1.0000 mg | Freq: Four times a day (QID) | INTRAMUSCULAR | Status: DC | PRN
  Administered 2013-12-17 (×2): 1 mg via INTRAVENOUS
  Filled 2013-12-16 (×2): qty 1

## 2013-12-16 MED ORDER — LORAZEPAM 1 MG PO TABS
0.0000 mg | ORAL_TABLET | Freq: Four times a day (QID) | ORAL | Status: DC
  Administered 2013-12-16 (×2): 1 mg via ORAL
  Filled 2013-12-16: qty 1

## 2013-12-16 MED ORDER — ADULT MULTIVITAMIN W/MINERALS CH
1.0000 | ORAL_TABLET | Freq: Every day | ORAL | Status: DC
  Administered 2013-12-18: 1 via ORAL
  Filled 2013-12-16 (×2): qty 1

## 2013-12-16 MED ORDER — AMLODIPINE BESYLATE 10 MG PO TABS
10.0000 mg | ORAL_TABLET | Freq: Every day | ORAL | Status: DC
  Administered 2013-12-18: 10 mg via ORAL
  Filled 2013-12-16 (×2): qty 1

## 2013-12-16 MED ORDER — LORAZEPAM 0.5 MG PO TABS: 1.0000 mg | ORAL_TABLET | Freq: Four times a day (QID) | ORAL | Status: DC | PRN

## 2013-12-16 MED ORDER — LORAZEPAM 2 MG/ML IJ SOLN
1.0000 mg | Freq: Once | INTRAMUSCULAR | Status: AC
  Administered 2013-12-16: 1 mg via INTRAVENOUS
  Filled 2013-12-16: qty 1

## 2013-12-16 MED ORDER — VITAMIN B-1 100 MG PO TABS: 100.0000 mg | ORAL_TABLET | Freq: Every day | ORAL | Status: DC

## 2013-12-16 MED ORDER — HEPARIN SODIUM (PORCINE) 5000 UNIT/ML IJ SOLN
5000.0000 [IU] | Freq: Three times a day (TID) | INTRAMUSCULAR | Status: DC
  Administered 2013-12-16 – 2013-12-18 (×6): 5000 [IU] via SUBCUTANEOUS
  Filled 2013-12-16 (×8): qty 1

## 2013-12-16 NOTE — ED Provider Notes (Signed)
CSN: [id number]     Arrival date & time 12/16/13  1143 History   First MD Initiated Contact with Patient 12/16/13 1209     Chief Complaint  Patient presents with  . Seizures     (Consider location/radiation/quality/duration/timing/severity/associated sxs/prior Treatment) HPI  This is a 48 year old with a history of hypertension, seizures, alcohol abuse who presents with seizures. Patient reports compliance with Dilantin. Reports 2 seizures this morning 1 at approximately 1:30 AM and another one at 7:30 AM. These were witnessed by his mother.  Currently he is awake, alert, and oriented. He has no physical complaints. He denies any headache, chest pain, shortness of breath.  He does not see a neurologist. He does have a history of alcohol abuse. Patient does report that his seizures have become more frequent recently. Patient reports that he last drank on Saturday. He normally drinks half a pint "daily or every other day" in addition to beer.  Past Medical History  Diagnosis Date  . Seizures   . Alcohol abuse   . Hiccups   . HTN (hypertension)    Past Surgical History  Procedure Laterality Date  . No past surgeries     No family history on file. History  Substance Use Topics  . Smoking status: Current Every Day Smoker -- 0.50 packs/day for 25 years    Types: Cigarettes  . Smokeless tobacco: Never Used  . Alcohol Use: Yes     Comment: a fifth every other day or 4-5 beers per day    Review of Systems  Constitutional: Negative.  Negative for fever.  Respiratory: Negative.  Negative for chest tightness and shortness of breath.   Cardiovascular: Negative.  Negative for chest pain.  Gastrointestinal: Negative.  Negative for abdominal pain.  Genitourinary: Negative.  Negative for dysuria.  Musculoskeletal: Negative for back pain.  Neurological: Positive for seizures. Negative for dizziness, speech difficulty, weakness and headaches.  All other systems reviewed and are  negative.     Allergies  Review of patient's allergies indicates no known allergies.  Home Medications   Prior to Admission medications   Medication Sig Start Date End Date Taking? Authorizing Provider  amLODipine (NORVASC) 10 MG tablet Take 1 tablet (10 mg total) by mouth daily. 10/28/13  Yes Carmin Muskrat, MD  folic acid (FOLVITE) 1 MG tablet Take 1 tablet (1 mg total) by mouth daily. 10/28/13  Yes Carmin Muskrat, MD  phenytoin (DILANTIN) 100 MG ER capsule Take 1 capsule (100 mg total) by mouth 3 (three) times daily. 10/28/13  Yes Carmin Muskrat, MD  ondansetron (ZOFRAN-ODT) 8 MG disintegrating tablet Take 1 tablet (8 mg total) by mouth once. 10/28/13   Carmin Muskrat, MD   BP 112/81 mmHg  Pulse 108  Temp(Src) 98.8 F (37.1 C) (Oral)  Resp 17  SpO2 96% Physical Exam  Constitutional: He is oriented to person, place, and time.  Ill-appearing, diaphoretic  HENT:  Head: Normocephalic and atraumatic.  Eyes: Pupils are equal, round, and reactive to light.  Cardiovascular: Regular rhythm and normal heart sounds.   No murmur heard. tachycardia  Pulmonary/Chest: Effort normal and breath sounds normal. No respiratory distress. He has no wheezes.  Abdominal: Soft. Bowel sounds are normal. There is no tenderness. There is no rebound.  Musculoskeletal: He exhibits no edema.  Neurological: He is alert and oriented to person, place, and time.  Skin: Skin is warm and dry.  Psychiatric: He has a normal mood and affect.  Nursing note and vitals reviewed.   ED  Course  Procedures (including critical care time) Labs Review Labs Reviewed  CBC WITH DIFFERENTIAL - Abnormal; Notable for the following:    RBC 3.72 (*)    Hemoglobin 12.5 (*)    HCT 35.1 (*)    Neutrophils Relative % 78 (*)    All other components within normal limits  BASIC METABOLIC PANEL - Abnormal; Notable for the following:    Sodium 136 (*)    Chloride 89 (*)    Glucose, Bld 102 (*)    GFR calc non Af Amer 66 (*)     GFR calc Af Amer 77 (*)    Anion gap 21 (*)    All other components within normal limits  PHENYTOIN LEVEL, TOTAL - Abnormal; Notable for the following:    Phenytoin Lvl 4.8 (*)    All other components within normal limits  COMPREHENSIVE METABOLIC PANEL - Abnormal; Notable for the following:    Sodium 134 (*)    Potassium 3.1 (*)    Chloride 89 (*)    Glucose, Bld 67 (*)    AST 157 (*)    Anion gap 21 (*)    All other components within normal limits  CBC - Abnormal; Notable for the following:    RBC 3.42 (*)    Hemoglobin 11.1 (*)    HCT 32.3 (*)    Platelets 147 (*)    All other components within normal limits  MRSA PCR SCREENING  ETHANOL    Imaging Review No results found.   EKG Interpretation None      MDM   Final diagnoses:  Fall  Seizure  Alcohol withdrawal, with delirium    Patient presents with frequent seizure activity. Nontoxic here. Noted to be tachycardic and diaphoretic. Suspect more frequent seizure activity may be related to alcohol withdrawal. Patient placed on CIWA protocol.  Patient given fluids, Ativan, and thiamine. Patient has no desire at this time to quit drinking.  Dilantin level is low. Patient was loaded with Dilantin. On repeat evaluation, patient is very tremulous and unable to ambulate safely on his own. Will add a CT head. Banana bag ordered. CT head pending. Suspect patient may need admission for early DTs.    Merryl Hacker, MD 12/17/13 [phone]

## 2013-12-16 NOTE — ED Notes (Signed)
Patient transported to CT 

## 2013-12-16 NOTE — H&P (Signed)
Date: 12/16/2013               Patient Name:  Joseph Underwood MRN: [id number]  DOB: April 05, 1965 Age / Sex: 48 y.o., male   PCP: Drucilla Schmidt, MD         Medical Service: Internal Medicine Teaching Service         Attending Physician: Dr. Campbell Riches, MD    First Contact: Dr. Marvel Plan Pager: [phone]  Second Contact: Dr. Ronnald Ramp Pager: [phone]       After Hours (After 5p/  First Contact Pager: [phone]  weekends / holidays): Second Contact Pager: [phone]   Chief Complaint: seizure  History of Present Illness:   [name] is a 48 year old gentleman with a history of hypertension, alcohol abuse, and seizures who presents with two seizures within the last 24 hours. His seizures were witnessed by his mother. Patient states that he had a postictal period of fatigue and confusion for about 20 minutes. Patient does endorse having bowel and bladder incontinence along with tongue biting with his seizures today. Upon review of the chart, patient has had issues with compliance with his Dilantin in the past. 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 Patient denies having any issues with forgetting his medications, significant side effects, or financial barriers to obtaining his medications. Patient states that he usually has about one seizure per month at baseline. Patient also states drinking around a pint of gin a day. He reports that he has never seen a neurologist. Patient states that he last had a drink of alcohol on Saturday (October 31), and states that he has been trying to cut down on the amount that he drinks. Patient states that he particularly did not drink in the last couple days because he didn't have the taste for it.  Patient otherwise denies any focal weakness, visual deficits, diplopia, or facial droop. Patient does report  having a productive cough for the last 2 weeks, that is productive of yellow sputum. Otherwise, patient denies any fevers, chills, chest pain, dyspnea, abdominal pain, vomiting, constipation, or diarrhea.  Patient was admitted in March 2015 for seizures secondary to alcohol withdrawal. Patient attempted to leave AMA during that hospitalization and eventually required transfer to the ICU for severe withdrawal symptoms.  In the emergency department, patient was loaded with 1000 mg of Dilantin and 1 mg Ativan 4 for alcohol withdrawal symptoms.  Past Medical History  Diagnosis Date  . Seizures   . Alcohol abuse   . Hiccups   . HTN (hypertension)    Past Surgical History  Procedure Laterality Date  . No past surgeries      Meds: Current Facility-Administered Medications  Medication Dose Route Frequency Provider Last Rate Last Dose  . LORazepam (ATIVAN) tablet 0-4 mg  0-4 mg Oral 4 times per day Merryl Hacker, MD   1 mg at 12/16/13 1744  . LORazepam (ATIVAN) tablet 0-4 mg  0-4 mg Oral Q12H Merryl Hacker, MD   0 mg at 12/16/13 1312  . thiamine (B-1) injection 100 mg  100 mg Intravenous Daily Merryl Hacker, MD   100 mg at 12/16/13 1307  . thiamine (VITAMIN B-1) tablet 100 mg  100 mg Oral Daily Merryl Hacker, MD   100 mg at 12/16/13 1312   Current  Outpatient Prescriptions  Medication Sig Dispense Refill  . amLODipine (NORVASC) 10 MG tablet Take 1 tablet (10 mg total) by mouth daily. 30 tablet 0  . folic acid (FOLVITE) 1 MG tablet Take 1 tablet (1 mg total) by mouth daily. 30 tablet 0  . phenytoin (DILANTIN) 100 MG ER capsule Take 1 capsule (100 mg total) by mouth 3 (three) times daily. 90 capsule 0  . ondansetron (ZOFRAN-ODT) 8 MG disintegrating tablet Take 1 tablet (8 mg total) by mouth once. 20 tablet 0    Allergies: Allergies as of 12/16/2013  . (No Known Allergies)   No family history on file. History   Social History  . Marital Status: Single    Spouse Name:  N/A    Number of Children: N/A  . Years of Education: N/A   Occupational History  . Not on file.   Social History Main Topics  . Smoking status: Current Every Day Smoker -- 0.50 packs/day for 25 years    Types: Cigarettes  . Smokeless tobacco: Never Used  . Alcohol Use: Yes     Comment: a fifth every other day or 4-5 beers per day  . Drug Use: No  . Sexual Activity: Not on file   Other Topics Concern  . Not on file   Social History Narrative    Review of Systems: All pertinent ROS has stated in HPI.   Physical Exam: Blood pressure 141/93, pulse 113, temperature 98.5 F (36.9 C), temperature source Oral, resp. rate 20, SpO2 100 %. General: resting in bed, speech regularly interrupted by hiccups HEENT: PERRL, EOMI, no scleral icterus Cardiac: RRR, no rubs, murmurs or gallops Pulm: diminished breath sounds bilateral lung fields, moving normal volumes of air Abd: soft, nontender, nondistended, BS present Ext: warm and well perfused, no pedal edema Neuro: alert and oriented X3, though with some mild confusion and restlessness  - cranial nerves II through XII intact  - motor: Strength 5+ throughout all 4 extremities, moderate tremor  - Sensation: Intact to light touch throughout all 4 extremities  - Reflexes: 2+ deep tendon reflexes throughout all 4 extremities Skin: no rashes or lesions noted Psych: appropriate affect and cognition  Lab results: Basic Metabolic Panel:  Recent Labs  12/16/13 1209  NA 136*  K 4.1  CL 89*  CO2 26  GLUCOSE 102*  BUN 9  CREATININE 1.26  CALCIUM 9.3   Liver Function Tests: No results for input(s): AST, ALT, ALKPHOS, BILITOT, PROT, ALBUMIN in the last 72 hours. No results for input(s): LIPASE, AMYLASE in the last 72 hours. No results for input(s): AMMONIA in the last 72 hours. CBC:  Recent Labs  12/16/13 1209  WBC 6.1  NEUTROABS 4.8  HGB 12.5*  HCT 35.1*  MCV 94.4  PLT 184   Cardiac Enzymes: No results for input(s):  CKTOTAL, CKMB, CKMBINDEX, TROPONINI in the last 72 hours. BNP: No results for input(s): PROBNP in the last 72 hours. D-Dimer: No results for input(s): DDIMER in the last 72 hours. CBG: No results for input(s): GLUCAP in the last 72 hours. Hemoglobin A1C: No results for input(s): HGBA1C in the last 72 hours. Fasting Lipid Panel: No results for input(s): CHOL, HDL, LDLCALC, TRIG, CHOLHDL, LDLDIRECT in the last 72 hours. Thyroid Function Tests: No results for input(s): TSH, T4TOTAL, FREET4, T3FREE, THYROIDAB in the last 72 hours. Anemia Panel: No results for input(s): VITAMINB12, FOLATE, FERRITIN, TIBC, IRON, RETICCTPCT in the last 72 hours. Coagulation: No results for input(s): LABPROT, INR in the  last 72 hours. Urine Drug Screen: Drugs of Abuse     Component Value Date/Time   LABOPIA NONE DETECTED 08/14/2013 1740   COCAINSCRNUR NONE DETECTED 08/14/2013 1740   LABBENZ NONE DETECTED 08/14/2013 1740   AMPHETMU NONE DETECTED 08/14/2013 1740   THCU NONE DETECTED 08/14/2013 1740   LABBARB NONE DETECTED 08/14/2013 1740    Alcohol Level:  Recent Labs  12/16/13 1300  ETH <11   Urinalysis: No results for input(s): COLORURINE, LABSPEC, PHURINE, GLUCOSEU, HGBUR, BILIRUBINUR, KETONESUR, PROTEINUR, UROBILINOGEN, NITRITE, LEUKOCYTESUR in the last 72 hours.  Invalid input(s): APPERANCEUR  Imaging results:  Ct Head Wo Contrast  12/16/2013   CLINICAL DATA:  48 year old male with seizure at 0300 hr and again at 1145 hrs. Right side tongue trauma. Current history of seizure on Dilantin. Initial encounter.  EXAM: CT HEAD WITHOUT CONTRAST  TECHNIQUE: Contiguous axial images were obtained from the base of the skull through the vertex without intravenous contrast.  COMPARISON:  Head CT without contrast 10/09/2010.  FINDINGS: Visualized paranasal sinuses and mastoids are clear. Stable visualized osseous structures. No acute orbit or scalp soft tissue finding.  Cerebral volume is decreased for age in  a generalized fashion, and volume loss has slightly progressed since 2012. No ventriculomegaly. No midline shift, mass effect, or evidence of intracranial mass lesion. No evidence of cortically based acute infarction identified. There is mild chronic cortical encephalomalacia along the lateral right temporal lobe which is stable (arrows). Elsewhere normal gray-white matter differentiation. No acute intracranial hemorrhage identified. Mild Calcified atherosclerosis at the skull base. No suspicious intracranial vascular hyperdensity.  IMPRESSION: 1.  No acute intracranial abnormality. 2. Chronic right temporal lobe encephalomalacia and mildly progressed generalized cerebral volume loss.   Electronically Signed   By: Lars Pinks M.D.   On: 12/16/2013 16:13   Assessment & Plan by Problem: Active Problems:   Alcohol withdrawal seizure  [name] is a 48 year old gentleman with a history of hypertension, alcohol abuse, and seizures who presents with two seizures within the last 24 hours.  Seizures: Patient has a history of seizures managed on phenytoin, though has had issues with compliance over the last several years according to clinic notes. Dilantin level subtherapeutic at 4.8 (normal range 10-20). Patient has been consistently subtherapeutic, with only 1 measurement of 21 measurements since 2012 that has been within the therapeutic range. CT brain showing chronic right temporal lobe encephalomalacia and mildly progressed generalized cerebral volume loss. In the past, patient reports that he has had a head injury that was a results of a seizure, rather than having a history of seizures after the injury. However, review of imaging shows that patient had a normal head CT in December 2000, though with a traumatic brain imaging study in February 2001 showing a left skull fracture along with subdural blood and intraparenchymal blood in the right temporal lobe. This area corresponds to the chronic right temporal lobe  encephalomalacia that has been noted on serial CT brain exams since 2001. In sum, the etiology of patient's recurrent seizures are likely multifactorial. 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 Patient has also been noncompliant with phenytoin therapy and has persistently been subtherapeutic. Additionally, patient has a history of alcohol abuse.   - admit to stepdown unit  - Patient loaded with dilantin in ED (1000 mg)  - CIWA  - Dilantin 100 mg 3 times a day (usual maintenance dose range: 300 to 600 mg daily)  - Sitter for some mild confusion.  Productive  cough: Patient reporting 2 weeks of productive cough along with diminished breath sounds on physical exam. Patient is afebrile with no leukocytosis.   - Chest x-ray  Alcohol abuse, anion gap metabolic acidosis likely 2/2 alcoholic ketoacidosis: Alcohol level less than 11. Anion gap of 21. Delta delta ratio greater than 1, suggesting a concurrent metabolic alkalosis. Patient denies any significant vomiting or diarrhea. Secondary metabolic process is unclear.  - Thiamine 100 mg daily  - CIWA  - IV fluids 75 mL per hour  Hypertension: Blood pressure moderately elevated in the 950D systolic.  - Continue home Amlodipine 10 mg daily  Tobacco dependence:  - Nicotine patch  Diet: No diet for now pending improvement of mental state. Prophylaxis: SQ heparin Code: Full  Dispo: Disposition is deferred at this time, awaiting improvement of current medical problems. Anticipated discharge in approximately 3 day(s).   The patient does have a current PCP Drucilla Schmidt, MD) and does need an [facility] hospital follow-up appointment after discharge.  The patient does not have transportation limitations that hinder transportation to clinic appointments.  Signed: Luan Moore, M.D., Ph.D. Internal Medicine Teaching Service, PGY-1 12/16/2013, 6:58 PM

## 2013-12-16 NOTE — ED Notes (Addendum)
Pt's mother, [name], phone [phone]

## 2013-12-16 NOTE — ED Notes (Signed)
Bed: WA20 Expected date:  Expected time:  Means of arrival:  Comments: seizure 

## 2013-12-16 NOTE — ED Notes (Signed)
[facility] called  

## 2013-12-16 NOTE — Progress Notes (Signed)
Pt arrived to unit via [facility] alert and oriented x4 but gets confused. Oriented to room, unit, and staff.  Bed in lowest position and call bell is within reach. Will continue to monitor pt.

## 2013-12-16 NOTE — ED Notes (Signed)
Per EMS, pt had a seizure at 0300 and a second seizure at 1145 today, each lasting ~1 min. Pt has hx of seizures, takes dilantin. Pt states he has been taking his dilantin as prescribed. Pt states he also had a seizure last week but did not come to the hospital. Pt has right sided tongue trama, denies any other injuries/pain. Pt A&Ox4.

## 2013-12-16 NOTE — ED Provider Notes (Signed)
Pt seen by Dr Dina Rich previously.  Head CT does not show any abnormality.  Pt continues to have difficulty with ambulation.  He just tried to walk to the bathroom and needed 2 people assisting him. The patient was very unsteady and tremulous.  Patient also appears to be slightly confused now. I believe he is experiencing alcohol withdrawal and some early signs of DTs.  We'll consult with medical service regarding admission.  Pt goes to the internal medicine teaching service.  Dorie Rank, MD 12/16/13 [phone]

## 2013-12-16 NOTE — Progress Notes (Signed)
Report called to Tish, RN on [place]. Pt will be transferred to 2C09.

## 2013-12-17 ENCOUNTER — Encounter (HOSPITAL_COMMUNITY): Payer: Self-pay | Admitting: Neurology

## 2013-12-17 ENCOUNTER — Inpatient Hospital Stay (HOSPITAL_COMMUNITY): Payer: Self-pay

## 2013-12-17 DIAGNOSIS — F10239 Alcohol dependence with withdrawal, unspecified: Secondary | ICD-10-CM | POA: Insufficient documentation

## 2013-12-17 DIAGNOSIS — F10939 Alcohol use, unspecified with withdrawal, unspecified: Secondary | ICD-10-CM | POA: Insufficient documentation

## 2013-12-17 DIAGNOSIS — Z9119 Patient's noncompliance with other medical treatment and regimen: Secondary | ICD-10-CM

## 2013-12-17 DIAGNOSIS — E876 Hypokalemia: Secondary | ICD-10-CM

## 2013-12-17 LAB — COMPREHENSIVE METABOLIC PANEL
ALT: 44 U/L (ref 0–53)
AST: 157 U/L — AB (ref 0–37)
Albumin: 3.5 g/dL (ref 3.5–5.2)
Alkaline Phosphatase: 92 U/L (ref 39–117)
Anion gap: 21 — ABNORMAL HIGH (ref 5–15)
BILIRUBIN TOTAL: 0.8 mg/dL (ref 0.3–1.2)
BUN: 6 mg/dL (ref 6–23)
CO2: 24 meq/L (ref 19–32)
CREATININE: 0.61 mg/dL (ref 0.50–1.35)
Calcium: 8.5 mg/dL (ref 8.4–10.5)
Chloride: 89 mEq/L — ABNORMAL LOW (ref 96–112)
GFR calc Af Amer: >90 mL/min (ref >90)
Glucose, Bld: 67 mg/dL — ABNORMAL LOW (ref 70–99)
Potassium: 3.1 mEq/L — ABNORMAL LOW (ref 3.7–5.3)
Sodium: 134 mEq/L — ABNORMAL LOW (ref 137–147)
Total Protein: 6.9 g/dL (ref 6.0–8.3)

## 2013-12-17 LAB — CBC
HCT: 32.3 % — ABNORMAL LOW (ref 39.0–52.0)
Hemoglobin: 11.1 g/dL — ABNORMAL LOW (ref 13.0–17.0)
MCH: 32.5 pg (ref 26.0–34.0)
MCHC: 34.4 g/dL (ref 30.0–36.0)
MCV: 94.4 fL (ref 78.0–100.0)
PLATELETS: 147 10*3/uL — AB (ref 150–400)
RBC: 3.42 MIL/uL — ABNORMAL LOW (ref 4.22–5.81)
RDW: 14.1 % (ref 11.5–15.5)
WBC: 7.4 10*3/uL (ref 4.0–10.5)

## 2013-12-17 LAB — MRSA PCR SCREENING: MRSA by PCR: NEGATIVE

## 2013-12-17 MED ORDER — SODIUM CHLORIDE 0.9 % IV SOLN
INTRAVENOUS | Status: DC
  Administered 2013-12-17: 03:00:00 via INTRAVENOUS

## 2013-12-17 MED ORDER — NICOTINE 14 MG/24HR TD PT24: 14.0000 mg | MEDICATED_PATCH | Freq: Every day | TRANSDERMAL | Status: DC

## 2013-12-17 MED ORDER — NICOTINE 14 MG/24HR TD PT24
14.0000 mg | MEDICATED_PATCH | TRANSDERMAL | Status: DC
Start: 2013-12-17 — End: 2013-12-17
  Administered 2013-12-17: 14 mg via TRANSDERMAL
  Filled 2013-12-17 (×2): qty 1

## 2013-12-17 MED ORDER — POTASSIUM CHLORIDE 10 MEQ/100ML IV SOLN
10.0000 meq | INTRAVENOUS | Status: AC
  Administered 2013-12-17 (×4): 10 meq via INTRAVENOUS
  Filled 2013-12-17 (×5): qty 100

## 2013-12-17 MED ORDER — NICOTINE 14 MG/24HR TD PT24
14.0000 mg | MEDICATED_PATCH | TRANSDERMAL | Status: DC
  Administered 2013-12-17: 14 mg via TRANSDERMAL
  Filled 2013-12-17 (×2): qty 1

## 2013-12-17 NOTE — Consult Note (Addendum)
NEURO HOSPITALIST CONSULT NOTE    Reason for Consult: seizure in setting of ETOH withdrawal and medication noncompliance.   HPI:                                                                                                                                          Joseph Underwood is an 48 y.o. male with history of CVA in 2001 and seizure disorder.  He has been on Dilantin with directions to take 100 mg TID.  Patient states he takes one pill in the morning and another at night but does not take his afternoon dose.  He denies any SE from Dilantin that would defer him from taking Dilantin.  He sates he has a hard time "finding time to take his afternoon dose at work".  He admits his Dilantin levels are usually low due to his poor complaince.  In addition, he admits to drinking a pint of gin ETOH daily.  He states he stopped drinking on 12-14-2013 due to trying to quit. He was brought to [facility] after his mother witness him having a seizure.  Currently he is alert and oriented.  He has been loaded with  1 gram dilantin and received morning dose of 100 mg Dilantin.  He has had no further seizures.   Past Medical History  Diagnosis Date  . Alcohol abuse   . Hiccups   . HTN (hypertension)   . Alcohol related seizure     Past Surgical History  Procedure Laterality Date  . No past surgeries      Family History  Problem Relation Age of Onset  . Hypertension Mother     Social History:  reports that he has been smoking Cigarettes.  He has a 18.5 pack-year smoking history. He has never used smokeless tobacco. He reports that he drinks about 52.8 oz of alcohol per week. He reports that he does not use illicit drugs.  No Known Allergies  MEDICATIONS:                                                                                                                     Prior to Admission:  Prescriptions prior to admission  Medication Sig Dispense Refill Last Dose  . amLODipine  (NORVASC) 10 MG tablet  Take 1 tablet (10 mg total) by mouth daily. 30 tablet 0 12/16/2013 at Unknown time  . folic acid (FOLVITE) 1 MG tablet Take 1 tablet (1 mg total) by mouth daily. 30 tablet 0 12/16/2013 at Unknown time  . phenytoin (DILANTIN) 100 MG ER capsule Take 1 capsule (100 mg total) by mouth 3 (three) times daily. 90 capsule 0 12/16/2013 at 0930  . ondansetron (ZOFRAN-ODT) 8 MG disintegrating tablet Take 1 tablet (8 mg total) by mouth once. 20 tablet 0    Scheduled: . amLODipine  10 mg Oral Daily  . folic acid  1 mg Oral Daily  . heparin  5,000 Units Subcutaneous 3 times per day  . multivitamin with minerals  1 tablet Oral Daily  . nicotine  14 mg Transdermal Q24H  . phenytoin  100 mg Oral TID  . potassium chloride  10 mEq Intravenous Q1 Hr x 4  . sodium chloride  3 mL Intravenous Q12H  . thiamine  100 mg Oral Daily   Or  . thiamine  100 mg Intravenous Daily     ROS:                                                                                                                                       History obtained from the patient  General ROS: negative for - chills, fatigue, fever, night sweats, weight gain or weight loss Psychological ROS: negative for - behavioral disorder, hallucinations, memory difficulties, mood swings or suicidal ideation Ophthalmic ROS: negative for - blurry vision, double vision, eye pain or loss of vision ENT ROS: negative for - epistaxis, nasal discharge, oral lesions, sore throat, tinnitus or vertigo Allergy and Immunology ROS: negative for - hives or itchy/watery eyes Hematological and Lymphatic ROS: negative for - bleeding problems, bruising or swollen lymph nodes Endocrine ROS: negative for - galactorrhea, hair pattern changes, polydipsia/polyuria or temperature intolerance Respiratory ROS: negative for - cough, hemoptysis, shortness of breath or wheezing Cardiovascular ROS: negative for - chest pain, dyspnea on exertion, edema or irregular  heartbeat Gastrointestinal ROS: negative for - abdominal pain, diarrhea, hematemesis, nausea/vomiting or stool incontinence Genito-Urinary ROS: negative for - dysuria, hematuria, incontinence or urinary frequency/urgency Musculoskeletal ROS: negative for - joint swelling or muscular weakness Neurological ROS: as noted in HPI Dermatological ROS: negative for rash and skin lesion changes   Blood pressure 136/96, pulse 114, temperature 98.4 F (36.9 C), temperature source Oral, resp. rate 19, height 5\' 5"  (1.651 m), weight 50.1 kg (110 lb 7.2 oz), SpO2 100 %.   Neurologic Examination:  General: NAD Mental Status: Alert, oriented, thought content appropriate.  Speech fluent without evidence of aphasia.  Able to follow 3 step commands without difficulty. Cranial Nerves: II: Discs flat bilaterally; Visual fields grossly normal, pupils equal, round, reactive to light and accommodation III,IV, VI: ptosis not present, extra-ocular motions intact bilaterally V,VII: smile symmetric, facial light touch sensation normal bilaterally VIII: hearing normal bilaterally IX,X: gag reflex present XI: bilateral shoulder shrug XII: midline tongue extension without atrophy or fasciculations  Motor: Right : Upper extremity   5/5    Left:     Upper extremity   5/5  Lower extremity   5/5     Lower extremity   5/5 Tone and bulk:normal tone throughout; no atrophy noted Sensory: Pinprick and light touch intact throughout, bilaterally Deep Tendon Reflexes:  Right: Upper Extremity   Left: Upper extremity   biceps (C-5 to C-6) 2/4   biceps (C-5 to C-6) 2/4 tricep (C7) 2/4    triceps (C7) 2/4 Brachioradialis (C6) 2/4  Brachioradialis (C6) 2/4  Lower Extremity Lower Extremity  quadriceps (L-2 to L-4) 1/4   quadriceps (L-2 to L-4) 1/4 Achilles (S1) 1/4   Achilles (S1) 1/4  Plantars: Mute  bilaterally Cerebellar: normal finger-to-nose,  normal heel-to-shin test Gait: not tested CV: pulses palpable throughout    Lab Results: Basic Metabolic Panel:  Recent Labs Lab 12/16/13 1209 12/17/13 0417  NA 136* 134*  K 4.1 3.1*  CL 89* 89*  CO2 26 24  GLUCOSE 102* 67*  BUN 9 6  CREATININE 1.26 0.61  CALCIUM 9.3 8.5    Liver Function Tests:  Recent Labs Lab 12/17/13 0417  AST 157*  ALT 44  ALKPHOS 92  BILITOT 0.8  PROT 6.9  ALBUMIN 3.5   No results for input(s): LIPASE, AMYLASE in the last 168 hours. No results for input(s): AMMONIA in the last 168 hours.  CBC:  Recent Labs Lab 12/16/13 1209 12/17/13 0417  WBC 6.1 7.4  NEUTROABS 4.8  --   HGB 12.5* 11.1*  HCT 35.1* 32.3*  MCV 94.4 94.4  PLT 184 147*    Cardiac Enzymes: No results for input(s): CKTOTAL, CKMB, CKMBINDEX, TROPONINI in the last 168 hours.  Lipid Panel: No results for input(s): CHOL, TRIG, HDL, CHOLHDL, VLDL, LDLCALC in the last 168 hours.  CBG: No results for input(s): GLUCAP in the last 168 hours.  Microbiology: Results for orders placed or performed during the hospital encounter of 12/16/13  MRSA PCR Screening     Status: None   Collection Time: 12/17/13 12:41 AM  Result Value Ref Range Status   MRSA by PCR NEGATIVE NEGATIVE Final    Comment:        The GeneXpert MRSA Assay (FDA approved for NASAL specimens only), is one component of a comprehensive MRSA colonization surveillance program. It is not intended to diagnose MRSA infection nor to guide or monitor treatment for MRSA infections.     Coagulation Studies: No results for input(s): LABPROT, INR in the last 72 hours.  Imaging: Ct Head Wo Contrast  12/16/2013   CLINICAL DATA:  48 year old male with seizure at 0300 hr and again at 1145 hrs. Right side tongue trauma. Current history of seizure on Dilantin. Initial encounter.  EXAM: CT HEAD WITHOUT CONTRAST  TECHNIQUE: Contiguous axial images were obtained from the  base of the skull through the vertex without intravenous contrast.  COMPARISON:  Head CT without contrast 10/09/2010.  FINDINGS: Visualized paranasal sinuses and mastoids are clear. Stable visualized osseous structures.  No acute orbit or scalp soft tissue finding.  Cerebral volume is decreased for age in a generalized fashion, and volume loss has slightly progressed since 2012. No ventriculomegaly. No midline shift, mass effect, or evidence of intracranial mass lesion. No evidence of cortically based acute infarction identified. There is mild chronic cortical encephalomalacia along the lateral right temporal lobe which is stable (arrows). Elsewhere normal gray-white matter differentiation. No acute intracranial hemorrhage identified. Mild Calcified atherosclerosis at the skull base. No suspicious intracranial vascular hyperdensity.  IMPRESSION: 1.  No acute intracranial abnormality. 2. Chronic right temporal lobe encephalomalacia and mildly progressed generalized cerebral volume loss.   Electronically Signed   By: Lars Pinks M.D.   On: 12/16/2013 16:13   Dg Chest Port 1 View  12/17/2013   CLINICAL DATA:  Productive cough. Admitted with seizures and alcohol withdrawal. History of hypertension and smoking. Initial encounter.  EXAM: PORTABLE CHEST - 1 VIEW  COMPARISON:  08/14/2013; 04/27/2013; 04/23/2013  FINDINGS: Grossly unchanged cardiac silhouette and mediastinal contours. No focal airspace opacities. No pleural effusion or pneumothorax. No evidence of edema. No acute osseus abnormalities.  IMPRESSION: No acute cardiopulmonary disease. Specifically, no evidence of pneumonia.   Electronically Signed   By: Sandi Mariscal M.D.   On: 12/17/2013 09:49    Etta Quill PA-C [facility] [phone]  12/17/2013, 1:08 PM   Assessment/Plan: 48 YO male presenting to hospital after two witnessed seizures in setting of poor medication compliance (dialntin level) 4.8 and continued ETOH abuse with recent  caseation of ETOH. Seizures likely secondary to combination of both.   Recommend: 1) Continue Dilantin but he may take 300 mg QHS when level is therapeutic and transitioned to PO 2) ETOH cessation 3) Agree with CIWA protocol while in window of DT's  4) No driving, operating heavy machinery, perform activities at heights, swimming or participation in water activities for 6 months until release by outpatient physician.  This has been discussed with patient.   I personally participated in this patient's evaluation and management, including formulating above clinical impressions and management recommendations.  We will plan to see this patient in follow-up on an as-needed basis following this evaluation. Please call if follow-up evaluation is needed.  Rush Farmer M.D. [facility] [phone]

## 2013-12-17 NOTE — Progress Notes (Signed)
Patient is lethargic at this time. Spoke with Dr. Algis Liming regarding 1000 medications. Will hold them until he is more alert and able to swallow.

## 2013-12-17 NOTE — Progress Notes (Signed)
Subjective:  Patient was seen and examined this morning. Patient is very somnolent on exam, but able to answer questions. Patient remembers having his seizures yesterday. He states he has been compliant with his dilantin, but admits to heavy alcohol use and having stopped drinking on Saturday. He has no complaints this morning.   Objective: Vital signs in last 24 hours: Filed Vitals:   12/17/13 0300 12/17/13 0445 12/17/13 0500 12/17/13 0730  BP: 134/93 141/93 155/95 146/88  Pulse: 61 101 114 94  Temp:  98.8 F (37.1 C)  98.2 F (36.8 C)  TempSrc:  Oral  Oral  Resp: 16 16 13 22   Height:      Weight:      SpO2: 84% 100% 99% 97%   Weight change:   Intake/Output Summary (Last 24 hours) at 12/17/13 1158 Last data filed at 12/17/13 0600  Gross per 24 hour  Intake    300 ml  Output    650 ml  Net   -350 ml   Filed Vitals:   12/17/13 0300 12/17/13 0445 12/17/13 0500 12/17/13 0730  BP: 134/93 141/93 155/95 146/88  Pulse: 61 101 114 94  Temp:  98.8 F (37.1 C)  98.2 F (36.8 C)  TempSrc:  Oral  Oral  Resp: 16 16 13 22   Height:      Weight:      SpO2: 84% 100% 99% 97%   General: Vital signs reviewed.  Patient is well-developed and well-nourished, in no acute distress, somnolent and cooperative with exam.  Head: Normocephalic and atraumatic. Small lesion on tongue from bite. Eyes: Constricted pupils, EOMI, conjunctivae normal, no scleral icterus.   Cardiovascular: RRR, S1 normal, S2 normal, no murmurs, gallops, or rubs. Pulmonary/Chest: Mild expiratory rhonchi, no wheezes, or rales. Abdominal: Soft, non-tender, non-distended, BS +, no masses, organomegaly, or guarding present.  Musculoskeletal: No joint deformities, erythema, or stiffness, ROM full and nontender. Extremities: No lower extremity edema bilaterally,  pulses symmetric and intact bilaterally. No cyanosis or clubbing. Skin: Warm, dry and intact. No rashes or erythema. Psychiatric: Somnolent. Normal mood and  affect. speech and behavior is normal. Cognition and memory are normal.   Lab Results: Basic Metabolic Panel:  Recent Labs Lab 12/16/13 1209 12/17/13 0417  NA 136* 134*  K 4.1 3.1*  CL 89* 89*  CO2 26 24  GLUCOSE 102* 67*  BUN 9 6  CREATININE 1.26 0.61  CALCIUM 9.3 8.5   Liver Function Tests:  Recent Labs Lab 12/17/13 0417  AST 157*  ALT 44  ALKPHOS 92  BILITOT 0.8  PROT 6.9  ALBUMIN 3.5   CBC:  Recent Labs Lab 12/16/13 1209 12/17/13 0417  WBC 6.1 7.4  NEUTROABS 4.8  --   HGB 12.5* 11.1*  HCT 35.1* 32.3*  MCV 94.4 94.4  PLT 184 147*   Urine Drug Screen: Drugs of Abuse     Component Value Date/Time   LABOPIA NONE DETECTED 08/14/2013 1740   COCAINSCRNUR NONE DETECTED 08/14/2013 1740   LABBENZ NONE DETECTED 08/14/2013 1740   AMPHETMU NONE DETECTED 08/14/2013 1740   THCU NONE DETECTED 08/14/2013 1740   LABBARB NONE DETECTED 08/14/2013 1740    Alcohol Level:  Recent Labs Lab 12/16/13 1300  ETH <11   Micro Results: Recent Results (from the past 240 hour(s))  MRSA PCR Screening     Status: None   Collection Time: 12/17/13 12:41 AM  Result Value Ref Range Status   MRSA by PCR NEGATIVE NEGATIVE Final    Comment:  The GeneXpert MRSA Assay (FDA approved for NASAL specimens only), is one component of a comprehensive MRSA colonization surveillance program. It is not intended to diagnose MRSA infection nor to guide or monitor treatment for MRSA infections.    Studies/Results: Ct Head Wo Contrast  12/16/2013   CLINICAL DATA:  48 year old male with seizure at 0300 hr and again at 1145 hrs. Right side tongue trauma. Current history of seizure on Dilantin. Initial encounter.  EXAM: CT HEAD WITHOUT CONTRAST  TECHNIQUE: Contiguous axial images were obtained from the base of the skull through the vertex without intravenous contrast.  COMPARISON:  Head CT without contrast 10/09/2010.  FINDINGS: Visualized paranasal sinuses and mastoids are clear.  Stable visualized osseous structures. No acute orbit or scalp soft tissue finding.  Cerebral volume is decreased for age in a generalized fashion, and volume loss has slightly progressed since 2012. No ventriculomegaly. No midline shift, mass effect, or evidence of intracranial mass lesion. No evidence of cortically based acute infarction identified. There is mild chronic cortical encephalomalacia along the lateral right temporal lobe which is stable (arrows). Elsewhere normal gray-white matter differentiation. No acute intracranial hemorrhage identified. Mild Calcified atherosclerosis at the skull base. No suspicious intracranial vascular hyperdensity.  IMPRESSION: 1.  No acute intracranial abnormality. 2. Chronic right temporal lobe encephalomalacia and mildly progressed generalized cerebral volume loss.   Electronically Signed   By: Lars Pinks M.D.   On: 12/16/2013 16:13   Dg Chest Port 1 View  12/17/2013   CLINICAL DATA:  Productive cough. Admitted with seizures and alcohol withdrawal. History of hypertension and smoking. Initial encounter.  EXAM: PORTABLE CHEST - 1 VIEW  COMPARISON:  08/14/2013; 04/27/2013; 04/23/2013  FINDINGS: Grossly unchanged cardiac silhouette and mediastinal contours. No focal airspace opacities. No pleural effusion or pneumothorax. No evidence of edema. No acute osseus abnormalities.  IMPRESSION: No acute cardiopulmonary disease. Specifically, no evidence of pneumonia.   Electronically Signed   By: Sandi Mariscal M.D.   On: 12/17/2013 09:49   Medications:  I have reviewed the patient's current medications. Prior to Admission:  Prescriptions prior to admission  Medication Sig Dispense Refill Last Dose  . amLODipine (NORVASC) 10 MG tablet Take 1 tablet (10 mg total) by mouth daily. 30 tablet 0 12/16/2013 at Unknown time  . folic acid (FOLVITE) 1 MG tablet Take 1 tablet (1 mg total) by mouth daily. 30 tablet 0 12/16/2013 at Unknown time  . phenytoin (DILANTIN) 100 MG ER capsule Take 1  capsule (100 mg total) by mouth 3 (three) times daily. 90 capsule 0 12/16/2013 at 0930  . ondansetron (ZOFRAN-ODT) 8 MG disintegrating tablet Take 1 tablet (8 mg total) by mouth once. 20 tablet 0    Scheduled Meds: . amLODipine  10 mg Oral Daily  . folic acid  1 mg Oral Daily  . heparin  5,000 Units Subcutaneous 3 times per day  . multivitamin with minerals  1 tablet Oral Daily  . nicotine  14 mg Transdermal Q24H  . phenytoin  100 mg Oral TID  . potassium chloride  10 mEq Intravenous Q1 Hr x 4  . sodium chloride  3 mL Intravenous Q12H  . thiamine  100 mg Oral Daily   Or  . thiamine  100 mg Intravenous Daily   Continuous Infusions: . sodium chloride 75 mL/hr at 12/17/13 0236   PRN Meds:.LORazepam **OR** LORazepam Assessment/Plan: Principal Problem:   Alcohol withdrawal seizure Active Problems:   Alcohol dependence   Tobacco dependence  Seizures 2/2 Alcohol Withdrawal  and Medical Non-compliance: Recurrent seizures are likely multifactorial. Patient likely has a nidus within the right temporal lobe and has also been noncompliant with phenytoin therapy and persistently subtherapeutic. Patient has a history of alcohol abuse and recently stopped drinking 3 days ago. -CIWA -Dilantin 100 mg 3 times a day (usual maintenance dose range: 300 to 600 mg daily) -Sitter  -Consult to Neurology  Viral URI: Patient reported 2 weeks of productive cough and had mild expiratory rhonchi on physical exam. Patient is afebrile with no leukocytosis. CXR showed no acute cardiopulmonary disease and specifically, no evidence of pneumonia. -Supportive management  Alcohol abuse, anion gap metabolic acidosis likely 2/2 alcoholic ketoacidosis: Patient admits to drinking about a pint of gin per day. Quit drinking 3 days ago. Alcohol level less than 11 on admission. Anion gap of 21. Patient has a anion gap metabolic acidosis with concurrent metabolic alkalosis of unclear etiology. CIWA scores have been elevated and  patient has been receiving Ativan. Patient was somnolent, but cooperative this morning on exam.  -Thiamine 100 mg daily -CIWA -IV fluids 75 mL per hour  Hypokalemia: Potassium of 3.1. -KCl 10 mEq IV x 4 over the course of 4 hours -Repeat BMET tomorrow am  Hypertension: Blood pressure moderately elevated 140-150s/80-90s. Patient is on amlodipine 10 mg daily at home. -Continue home Amlodipine 10 mg daily  DVT/PE Prophylaxis: Heparin SQ TID  Dispo: Disposition is deferred at this time, awaiting improvement of current medical problems.  Anticipated discharge in approximately 1-2 day(s).   The patient does have a current PCP Drucilla Schmidt, MD) and does need an [facility] hospital follow-up appointment after discharge.  The patient does not have transportation limitations that hinder transportation to clinic appointments.  .Services Needed at time of discharge: Y = Yes, Blank = No PT:   OT:   RN:   Equipment:   Other:     LOS: 1 day   Osa Craver, DO PGY-1 Internal Medicine Resident Pager # [phone] 12/17/2013 11:58 AM

## 2013-12-17 NOTE — Progress Notes (Signed)
Pt transferred to 2C09 via bed. Belongings with patient at bedside.

## 2013-12-17 NOTE — Progress Notes (Signed)
Patient alert and oriented tonight with a CIWA of 0. Patient requesting fluids. Patient was able to tolerate PO medications tonight with sips of water. MD on call notified of patient's request. Diet orders received. Will continue to monitor. Tresa Endo

## 2013-12-18 DIAGNOSIS — E440 Moderate protein-calorie malnutrition: Secondary | ICD-10-CM | POA: Insufficient documentation

## 2013-12-18 DIAGNOSIS — E43 Unspecified severe protein-calorie malnutrition: Secondary | ICD-10-CM | POA: Insufficient documentation

## 2013-12-18 LAB — BASIC METABOLIC PANEL
Anion gap: 23 — ABNORMAL HIGH (ref 5–15)
Anion gap: 17 — ABNORMAL HIGH (ref 5–15)
BUN: 6 mg/dL (ref 6–23)
BUN: 4 mg/dL — ABNORMAL LOW (ref 6–23)
CHLORIDE: 90 meq/L — AB (ref 96–112)
CO2: 18 mEq/L — ABNORMAL LOW (ref 19–32)
CO2: 21 mEq/L (ref 19–32)
CREATININE: 0.65 mg/dL (ref 0.50–1.35)
Calcium: 8.3 mg/dL — ABNORMAL LOW (ref 8.4–10.5)
Calcium: 8.2 mg/dL — ABNORMAL LOW (ref 8.4–10.5)
Chloride: 91 mEq/L — ABNORMAL LOW (ref 96–112)
Creatinine, Ser: 0.57 mg/dL (ref 0.50–1.35)
GFR calc non Af Amer: >90 mL/min (ref >90)
GFR calc non Af Amer: >90 mL/min (ref >90)
GLUCOSE: 43 mg/dL — AB (ref 70–99)
Glucose, Bld: 85 mg/dL (ref 70–99)
Potassium: 4 mEq/L (ref 3.7–5.3)
Potassium: 4.2 mEq/L (ref 3.7–5.3)
SODIUM: 129 meq/L — AB (ref 137–147)
Sodium: 131 mEq/L — ABNORMAL LOW (ref 137–147)

## 2013-12-18 LAB — GLUCOSE, CAPILLARY: GLUCOSE-CAPILLARY: 98 mg/dL (ref 70–99)

## 2013-12-18 LAB — PHENYTOIN LEVEL, TOTAL: Phenytoin Lvl: 18.3 ug/mL (ref 10.0–20.0)

## 2013-12-18 MED ORDER — ENSURE COMPLETE PO LIQD
237.0000 mL | Freq: Two times a day (BID) | ORAL | Status: DC
  Administered 2013-12-18: 237 mL via ORAL

## 2013-12-18 MED ORDER — PHENYTOIN SODIUM EXTENDED 100 MG PO CAPS
300.0000 mg | ORAL_CAPSULE | Freq: Every day | ORAL | Status: DC
  Filled 2013-12-18: qty 3

## 2013-12-18 MED ORDER — PHENYTOIN SODIUM EXTENDED 300 MG PO CAPS: 300.0000 mg | ORAL_CAPSULE | Freq: Every day | ORAL | Status: DC

## 2013-12-18 NOTE — Discharge Instructions (Signed)
·   Thank you for allowing [place] to be involved in your healthcare while you were hospitalized at [facility].   Please note that there have been changes to your home medications.  --> PLEASE LOOK AT YOUR DISCHARGE MEDICATION LIST FOR DETAILS.  Please call your PCP if you have any questions or concerns, or any difficulty getting any of your medications.  Please return to the ER if you have worsening of your symptoms or new severe symptoms arise.  Please take your new prescription for Dilantin 300 mg at night every night. Please follow up in our clinic for follow up. I have also made you an appointment with Neurology if you would like to follow up with them about your seizures. Without insurance, your copay for the neurology visit would be $200.   PLEASE no driving, operating heavy machinery, perform activities at heights, swimming or participation in water activities for 6 months until released by outpatient physician. This can be done in our clinic.   Seizure, Adult A seizure means there is unusual activity in the brain. A seizure can cause changes in attention or behavior. Seizures often cause shaking (convulsions). Seizures often last from 30 seconds to 2 minutes. HOME CARE   If you are given medicines, take them exactly as told by your doctor.  Keep all doctor visits as told.  Do not swim or drive until your doctor says it is okay.  Teach others what to do if you have a seizure. They should:  Lay you on the ground.  Put a cushion under your head.  Loosen any tight clothing around your neck.  Turn you on your side.  Stay with you until you get better. GET HELP RIGHT AWAY IF:   The seizure lasts longer than 2 to 5 minutes.  The seizure is very bad.  The person does not wake up after the seizure.  The person's attention or behavior changes. Drive the person to the emergency room or call your local emergency services (911 in [place] MAKE SURE YOU:   Understand  these instructions.  Will watch your condition.  Will get help right away if you are not doing well or get worse. Document Released: 07/20/2007 Document Revised: 04/25/2011 Document Reviewed: 01/19/2011 [facility] Patient Information 2015 [place], [place]. This information is not intended to replace advice given to you by your health care provider. Make sure you discuss any questions you have with your health care provider.

## 2013-12-18 NOTE — Progress Notes (Signed)
  Date: 12/18/2013  Patient name: Joseph Underwood  Medical record number: [id number]  Date of birth: [birth year]   This patient has been seen and the plan of care was discussed with the house staff. Please see their note for complete details. I concur with their findings with the following additions/corrections:  Dilantin level now therapeutic.  Glc have been difficult to control.  Appreciate neuro recommendations.   Campbell Riches, MD 12/18/2013, 8:49 AM

## 2013-12-18 NOTE — Progress Notes (Signed)
Pt discharged home at 16:00 to care of mother. Ptassured RN he had adequate home meds and demonstrated understanding of importance of taking his seizure meds.

## 2013-12-18 NOTE — Progress Notes (Addendum)
INITIAL NUTRITION ASSESSMENT  DOCUMENTATION CODES Per approved criteria  -Severe malnutrition in the context of chronic illness -Underweight   INTERVENTION: Ensure Complete po BID, each supplement provides 350 kcal and 13 grams of protein RD to follow for nutrition care plan  NUTRITION DIAGNOSIS: Increased nutrient needs related to malnutrition, repletion as evidenced by estimated nutrition needs  Goal: Pt to meet >/= 90% of their estimated nutrition needs   Monitor:  PO & supplemental intake, weight, labs, I/O's  Reason for Assessment: Malnutrition Screening Tool Report  48 y.o. male  Admitting Dx: Alcohol withdrawal seizure  ASSESSMENT: 48 yo Male with hx of CVA/head injury 2001. He has since had a sz d/o (rouhgly 1/month). He also has a hx of ETOH use (pint of gin/day). He has a hx of poor Dilantin compliance as well (taking BID instead of TID). He comes to [facility] with witnessed seizures and a productive cough x 2 weeks. He has not had any ETOH since 10-31.   Patient known to Clinical Nutrition during previous hospital admission in March 2015; pt with hx of poor appetite and wt loss; dx with severe malnutrition in acute illness or injury.  Pt reports to this RD he's eating well, however, PO intake 0-50% per flowsheet records; PTA he was consuming 2 meals per day; noted hx of ETOH use; this RD suspects usual intake continues to be nutritionally inadequate; pt reports + ongoing weight loss x 6 months; per wt readings below, has had an approximate 13 lb weight loss (11%) since April 2015 (severe for time frame); amenable to Ensure Complete supplements; RD to order.  Patient meets criteria for severe malnutrition in the context of chronic illness as evidenced by < 75% intake of estimated energy requirement for > 1 month and 11% weight loss x 6 months.  Height: Ht Readings from Last 1 Encounters:  12/18/13 5\' 5"  (1.651 m)    Weight: Wt Readings from Last 1 Encounters:  12/18/13  107 lb 9.4 oz (48.8 kg)    Ideal Body Weight: 136 lb  % Ideal Body Weight: 78%  Wt Readings from Last 20 Encounters:  12/18/13 107 lb 9.4 oz (48.8 kg)  10/28/13 112 lb (50.803 kg)  08/15/13 115 lb (52.164 kg)  07/12/13 112 lb 8 oz (51.03 kg)  06/07/13 120 lb (54.432 kg)  05/13/13 116 lb 9.6 oz (52.889 kg)  05/12/13 118 lb (53.524 kg)  05/03/13 118 lb 12.8 oz (53.887 kg)  04/24/13 109 lb 12.6 oz (49.8 kg)  04/23/13 116 lb 4.8 oz (52.753 kg)  02/01/13 113 lb 8 oz (51.483 kg)  12/31/12 115 lb 12.8 oz (52.527 kg)  10/28/11 116 lb 8 oz (52.844 kg)  07/28/11 119 lb 7.8 oz (54.2 kg)  07/18/11 115 lb 8 oz (52.39 kg)  03/10/11 118 lb (53.524 kg)  02/25/11 115 lb 9.6 oz (52.436 kg)  02/22/11 114 lb 3.2 oz (51.801 kg)  02/16/11 118 lb (53.524 kg)  12/03/10 118 lb 3.2 oz (53.615 kg)    Usual Body Weight: 120 lb -- April 2015  % Usual Body Weight: 89%  BMI:  Body mass index is 17.9 kg/(m^2).  Estimated Nutritional Needs: Kcal: 1700-1900 Protein: 80-90 gm Fluid: 1.7-1.9 L  Skin: Intact  Diet Order: Diet regular  EDUCATION NEEDS: -No education needs identified at this time   Intake/Output Summary (Last 24 hours) at 12/18/13 1206 Last data filed at 12/18/13 1100  Gross per 24 hour  Intake 2503.75 ml  Output   1675 ml  Net 828.75 ml    Labs:   Recent Labs Lab 12/17/13 0417 12/18/13 0305 12/18/13 1045  NA 134* 131* 129*  K 3.1* 4.0 4.2  CL 89* 90* 91*  CO2 24 18* 21  BUN 6 6 4*  CREATININE 0.61 0.65 0.57  CALCIUM 8.5 8.3* 8.2*  GLUCOSE 67* 43* 85    CBG (last 3)   Recent Labs  12/18/13 0547  GLUCAP 98    Scheduled Meds: . amLODipine  10 mg Oral Daily  . folic acid  1 mg Oral Daily  . heparin  5,000 Units Subcutaneous 3 times per day  . multivitamin with minerals  1 tablet Oral Daily  . nicotine  14 mg Transdermal Q24H  . phenytoin  300 mg Oral QHS  . sodium chloride  3 mL Intravenous Q12H  . thiamine  100 mg Oral Daily   Or  . thiamine  100  mg Intravenous Daily    Continuous Infusions: . sodium chloride 75 mL/hr at 12/17/13 0236    Past Medical History  Diagnosis Date  . Alcohol abuse   . Hiccups   . HTN (hypertension)   . Alcohol related seizure     Past Surgical History  Procedure Laterality Date  . No past surgeries      Arthur Holms, RD, LDN Pager #: [phone] After-Hours Pager #: [phone]

## 2013-12-18 NOTE — Progress Notes (Signed)
Subjective:  Patient was seen and examined this morning. Patient is much more awake this morning and denies any complaints. Patient requests to eat. He denies any confusion, tremulousness, chest pain, rapid heart rate or any more seizure like activity.   Objective: Vital signs in last 24 hours: Filed Vitals:   12/17/13 2020 12/17/13 2341 12/18/13 0331 12/18/13 0700  BP: 138/90 141/95 147/94 144/110  Pulse: 99 99 94 100  Temp: 98.4 F (36.9 C) 98.1 F (36.7 C) 98.1 F (36.7 C) 98.9 F (37.2 C)  TempSrc: Oral Oral Oral Oral  Resp: 15 16 16 16   Height:   5\' 5"  (1.651 m)   Weight:   48.8 kg (107 lb 9.4 oz)   SpO2: 99% 100% 100% 99%   Weight change: -1 kg (-2 lb 3.3 oz)  Intake/Output Summary (Last 24 hours) at 12/18/13 1045 Last data filed at 12/18/13 0900  Gross per 24 hour  Intake 2503.75 ml  Output   1675 ml  Net 828.75 ml    General: Vital signs reviewed.  Patient is well-developed and well-nourished, in no acute distress, and cooperative with exam.  Cardiovascular: RRR, S1 normal, S2 normal, no murmurs, gallops, or rubs. Pulmonary/Chest: Mild expiratory rhonchi, no wheezes, or rales. Abdominal: Soft, non-tender, non-distended, BS +, no masses, organomegaly, or guarding present.  Musculoskeletal: No joint deformities, erythema, or stiffness, ROM full and nontender. Extremities: No lower extremity edema bilaterally,  pulses symmetric and intact bilaterally. No cyanosis or clubbing. Skin: Warm, dry and intact. No rashes or erythema. Psychiatric: Awake, alert and oriented. Normal mood and affect. speech and behavior is normal. Cognition and memory are normal.   Lab Results: Basic Metabolic Panel:  Recent Labs Lab 12/17/13 0417 12/18/13 0305  NA 134* 131*  K 3.1* 4.0  CL 89* 90*  CO2 24 18*  GLUCOSE 67* 43*  BUN 6 6  CREATININE 0.61 0.65  CALCIUM 8.5 8.3*   Liver Function Tests:  Recent Labs Lab 12/17/13 0417  AST 157*  ALT 44  ALKPHOS 92  BILITOT 0.8    PROT 6.9  ALBUMIN 3.5   CBC:  Recent Labs Lab 12/16/13 1209 12/17/13 0417  WBC 6.1 7.4  NEUTROABS 4.8  --   HGB 12.5* 11.1*  HCT 35.1* 32.3*  MCV 94.4 94.4  PLT 184 147*   Urine Drug Screen: Drugs of Abuse     Component Value Date/Time   LABOPIA NONE DETECTED 08/14/2013 1740   COCAINSCRNUR NONE DETECTED 08/14/2013 1740   LABBENZ NONE DETECTED 08/14/2013 1740   AMPHETMU NONE DETECTED 08/14/2013 1740   THCU NONE DETECTED 08/14/2013 1740   LABBARB NONE DETECTED 08/14/2013 1740    Alcohol Level:  Recent Labs Lab 12/16/13 1300  ETH <11   Micro Results: Recent Results (from the past 240 hour(s))  MRSA PCR Screening     Status: None   Collection Time: 12/17/13 12:41 AM  Result Value Ref Range Status   MRSA by PCR NEGATIVE NEGATIVE Final    Comment:        The GeneXpert MRSA Assay (FDA approved for NASAL specimens only), is one component of a comprehensive MRSA colonization surveillance program. It is not intended to diagnose MRSA infection nor to guide or monitor treatment for MRSA infections.    Studies/Results: Ct Head Wo Contrast  12/16/2013   CLINICAL DATA:  48 year old male with seizure at 0300 hr and again at 1145 hrs. Right side tongue trauma. Current history of seizure on Dilantin. Initial encounter.  EXAM:  CT HEAD WITHOUT CONTRAST  TECHNIQUE: Contiguous axial images were obtained from the base of the skull through the vertex without intravenous contrast.  COMPARISON:  Head CT without contrast 10/09/2010.  FINDINGS: Visualized paranasal sinuses and mastoids are clear. Stable visualized osseous structures. No acute orbit or scalp soft tissue finding.  Cerebral volume is decreased for age in a generalized fashion, and volume loss has slightly progressed since 2012. No ventriculomegaly. No midline shift, mass effect, or evidence of intracranial mass lesion. No evidence of cortically based acute infarction identified. There is mild chronic cortical  encephalomalacia along the lateral right temporal lobe which is stable (arrows). Elsewhere normal gray-white matter differentiation. No acute intracranial hemorrhage identified. Mild Calcified atherosclerosis at the skull base. No suspicious intracranial vascular hyperdensity.  IMPRESSION: 1.  No acute intracranial abnormality. 2. Chronic right temporal lobe encephalomalacia and mildly progressed generalized cerebral volume loss.   Electronically Signed   By: Lars Pinks M.D.   On: 12/16/2013 16:13   Dg Chest Port 1 View  12/17/2013   CLINICAL DATA:  Productive cough. Admitted with seizures and alcohol withdrawal. History of hypertension and smoking. Initial encounter.  EXAM: PORTABLE CHEST - 1 VIEW  COMPARISON:  08/14/2013; 04/27/2013; 04/23/2013  FINDINGS: Grossly unchanged cardiac silhouette and mediastinal contours. No focal airspace opacities. No pleural effusion or pneumothorax. No evidence of edema. No acute osseus abnormalities.  IMPRESSION: No acute cardiopulmonary disease. Specifically, no evidence of pneumonia.   Electronically Signed   By: Sandi Mariscal M.D.   On: 12/17/2013 09:49   Medications:  I have reviewed the patient's current medications. Prior to Admission:  Prescriptions prior to admission  Medication Sig Dispense Refill Last Dose  . amLODipine (NORVASC) 10 MG tablet Take 1 tablet (10 mg total) by mouth daily. 30 tablet 0 12/16/2013 at Unknown time  . folic acid (FOLVITE) 1 MG tablet Take 1 tablet (1 mg total) by mouth daily. 30 tablet 0 12/16/2013 at Unknown time  . phenytoin (DILANTIN) 100 MG ER capsule Take 1 capsule (100 mg total) by mouth 3 (three) times daily. 90 capsule 0 12/16/2013 at 0930  . ondansetron (ZOFRAN-ODT) 8 MG disintegrating tablet Take 1 tablet (8 mg total) by mouth once. 20 tablet 0    Scheduled Meds: . amLODipine  10 mg Oral Daily  . folic acid  1 mg Oral Daily  . heparin  5,000 Units Subcutaneous 3 times per day  . multivitamin with minerals  1 tablet Oral  Daily  . nicotine  14 mg Transdermal Q24H  . phenytoin  300 mg Oral QHS  . sodium chloride  3 mL Intravenous Q12H  . thiamine  100 mg Oral Daily   Or  . thiamine  100 mg Intravenous Daily   Continuous Infusions: . sodium chloride 75 mL/hr at 12/17/13 0236   PRN Meds:.LORazepam **OR** LORazepam Assessment/Plan: Principal Problem:   Alcohol withdrawal seizure Active Problems:   Alcohol dependence   Tobacco dependence   Alcohol withdrawal  Seizures 2/2 Alcohol Withdrawal and Medical Non-compliance: Patient was seen by neurology who recommended dilantin 300 mg QHS. Patient denies recurrent seizures. No driving, operating heavy machinery, perform activities at heights, swimming or participation in water activities for 6 months until release by outpatient physician.  -CIWA -Dilantin 300 mg QHS -Neurology following -Outpatient follow up with Neurology -PCP follow up  Alcohol abuse, anion gap metabolic acidosis likely 2/2 alcoholic ketoacidosis: CIWA scores 4 overnight and 7 this afternoon. Patient has not received ativan in 2 days. Patient will  be out of the DT window tonight.  -Thiamine 100 mg daily -CIWA  Hypokalemia: Potassium of 4.2. -Resolved  Hypertension: Blood pressure moderately elevated 130-140s/80-90s. Patient is on amlodipine 10 mg daily at home. -Continue home Amlodipine 10 mg daily  DVT/PE Prophylaxis: Heparin SQ TID  Dispo: Disposition is deferred at this time, awaiting improvement of current medical problems.  Anticipated discharge in approximately 1-2 day(s).   The patient does have a current PCP Drucilla Schmidt, MD) and does need an [facility] hospital follow-up appointment after discharge.  The patient does not have transportation limitations that hinder transportation to clinic appointments.  .Services Needed at time of discharge: Y = Yes, Blank = No PT:   OT:   RN:   Equipment:   Other:     LOS: 2 days   Osa Craver, DO PGY-1 Internal Medicine  Resident Pager # [phone] 12/18/2013 10:45 AM

## 2013-12-18 NOTE — Clinical Social Work Note (Signed)
CSW spoke with patient Dr about concerns about patient driving.  CSW researched DMV protocol and was informed that physicians should inform the DMV of any concerns that they have about a patients ability to drive due to a medical condition.  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  CSW will continue to work with physician to inform DMV of medical concerns.  Domenica Reamer, [place] Social Worker [phone]

## 2013-12-18 NOTE — Evaluation (Signed)
Clinical/Bedside Swallow Evaluation Patient Details  Name: Joseph Underwood MRN: [id number] Date of Birth: 1965-08-27  Today's Date: 12/18/2013 Time: 3500-9381 SLP Time Calculation (min): 9 min  Past Medical History:  Past Medical History  Diagnosis Date  . Alcohol abuse   . Hiccups   . HTN (hypertension)   . Alcohol related seizure    Past Surgical History:  Past Surgical History  Procedure Laterality Date  . No past surgeries     HPI:  48 year old gentleman with a history of hypertension, alcohol abuse, and seizures who presents with two seizures within the last 24 hours. PMH:  compliance with meds, ETOH abuse, HTN.  CXR No acute cardiopulmonary disease. Specifically, no evidence of pneumonia.   Assessment / Plan / Recommendation Clinical Impression  Pt.'s oral and pharyngeal phases of swallow function were within functional limits.  No impairments noted although suspect esophageal component (almost belched).  Pt. denies GER but with severity of ETOH abuse it would not be unlikely.  Recommend regular texture diet and thin liquids, straws allowed, pills whole in applesauce and no further ST warranted.    Aspiration Risk  Mild    Diet Recommendation Regular;Thin liquid   Liquid Administration via: Cup;Straw Medication Administration: Whole meds with liquid Supervision: Patient able to self feed Compensations: Slow rate;Small sips/bites Postural Changes and/or Swallow Maneuvers: Seated upright 90 degrees;Upright 30-60 min after meal    Other  Recommendations Oral Care Recommendations: Oral care BID   Follow Up Recommendations  None    Frequency and Duration        Pertinent Vitals/Pain Denied pain         Swallow Study          Oral/Motor/Sensory Function Overall Oral Motor/Sensory Function: Impaired Labial ROM: Reduced left;Reduced right Labial Symmetry: Within Functional Limits Labial Strength: Reduced Lingual ROM: Reduced right Lingual Symmetry: Within  Functional Limits Facial ROM: Within Functional Limits Facial Symmetry: Within Functional Limits Facial Strength: Within Functional Limits Mandible: Within Functional Limits   Ice Chips Ice chips: Not tested   Thin Liquid Thin Liquid: Within functional limits Presentation: Cup;Straw    Nectar Thick Nectar Thick Liquid: Not tested   Honey Thick Honey Thick Liquid: Not tested   Puree Puree: Not tested   Solid   GO    Solid: Within functional limits       Houston Siren 12/18/2013,8:29 AM  Orbie Pyo Colvin Caroli.Ed [employer] [phone]

## 2013-12-18 NOTE — Progress Notes (Addendum)
Hypoglycemic Event  CBG: 43  Treatment: juice  Symptoms: diaphoretic  Follow-up CBG: 98  Time: 0545  CBG Result:  Possible Reasons for Event: recent npo status  Comments/MD notified:   Tresa Endo  Remember to initiate Hypoglycemia Order Set & complete
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
## 2013-12-22 ENCOUNTER — Encounter (HOSPITAL_COMMUNITY): Payer: Self-pay | Admitting: Emergency Medicine

## 2013-12-22 ENCOUNTER — Emergency Department (HOSPITAL_COMMUNITY)
Admission: EM | Admit: 2013-12-22 | Discharge: 2013-12-23 | Disposition: A | Discharge status: Home / Self Care | Payer: MEDICAID | Attending: Emergency Medicine | Admitting: Emergency Medicine

## 2013-12-22 DIAGNOSIS — Z720 Tobacco use: Secondary | ICD-10-CM | POA: Insufficient documentation

## 2013-12-22 DIAGNOSIS — F10920 Alcohol use, unspecified with intoxication, uncomplicated: Secondary | ICD-10-CM

## 2013-12-22 DIAGNOSIS — F10120 Alcohol abuse with intoxication, uncomplicated: Secondary | ICD-10-CM | POA: Insufficient documentation

## 2013-12-22 DIAGNOSIS — I10 Essential (primary) hypertension: Secondary | ICD-10-CM | POA: Insufficient documentation

## 2013-12-22 DIAGNOSIS — Z79899 Other long term (current) drug therapy: Secondary | ICD-10-CM | POA: Insufficient documentation

## 2013-12-22 LAB — CBC
HCT: 37.9 % — ABNORMAL LOW (ref 39.0–52.0)
Hemoglobin: 13.1 g/dL (ref 13.0–17.0)
MCH: 34 pg (ref 26.0–34.0)
MCHC: 34.6 g/dL (ref 30.0–36.0)
MCV: 98.4 fL (ref 78.0–100.0)
PLATELETS: 269 10*3/uL (ref 150–400)
RBC: 3.85 MIL/uL — ABNORMAL LOW (ref 4.22–5.81)
RDW: 14.5 % (ref 11.5–15.5)
WBC: 7 10*3/uL (ref 4.0–10.5)

## 2013-12-22 MED ORDER — LORAZEPAM 1 MG PO TABS
0.0000 mg | ORAL_TABLET | Freq: Four times a day (QID) | ORAL | Status: DC
Start: 2013-12-23 — End: 2013-12-23
  Administered 2013-12-23: 1 mg via ORAL

## 2013-12-22 MED ORDER — VITAMIN B-1 100 MG PO TABS: 100.0000 mg | ORAL_TABLET | Freq: Every day | ORAL | Status: DC

## 2013-12-22 MED ORDER — THIAMINE HCL 100 MG/ML IJ SOLN
100.0000 mg | Freq: Every day | INTRAMUSCULAR | Status: DC
  Administered 2013-12-23: 100 mg via INTRAVENOUS
  Filled 2013-12-22: qty 2

## 2013-12-22 MED ORDER — LORAZEPAM 2 MG/ML IJ SOLN: 0.0000 mg | Freq: Four times a day (QID) | INTRAMUSCULAR | Status: DC

## 2013-12-22 MED ORDER — LORAZEPAM 2 MG/ML IJ SOLN: 0.0000 mg | Freq: Two times a day (BID) | INTRAMUSCULAR | Status: DC

## 2013-12-22 MED ORDER — LORAZEPAM 1 MG PO TABS
0.0000 mg | ORAL_TABLET | Freq: Two times a day (BID) | ORAL | Status: DC
  Filled 2013-12-22: qty 1
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
## 2013-12-22 NOTE — ED Notes (Signed)
Pt reports drinking a pint today. Pt overheard speaking with registration, pt refusing to sign at this time.

## 2013-12-22 NOTE — ED Notes (Signed)
Bed: EN27 Expected date:  Expected time:  Means of arrival:  Comments: EMS/detox
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
## 2013-12-23 LAB — BASIC METABOLIC PANEL
ANION GAP: 19 — AB (ref 5–15)
BUN: 12 mg/dL (ref 6–23)
CHLORIDE: 98 meq/L (ref 96–112)
CO2: 28 mEq/L (ref 19–32)
Calcium: 10.1 mg/dL (ref 8.4–10.5)
Creatinine, Ser: 0.67 mg/dL (ref 0.50–1.35)
GFR calc non Af Amer: >90 mL/min (ref >90)
Glucose, Bld: 111 mg/dL — ABNORMAL HIGH (ref 70–99)
Potassium: 4.4 mEq/L (ref 3.7–5.3)
Sodium: 145 mEq/L (ref 137–147)

## 2013-12-23 LAB — URINALYSIS, ROUTINE W REFLEX MICROSCOPIC
BILIRUBIN URINE: NEGATIVE
Glucose, UA: NEGATIVE mg/dL
HGB URINE DIPSTICK: NEGATIVE
Ketones, ur: NEGATIVE mg/dL
Leukocytes, UA: NEGATIVE
Nitrite: NEGATIVE
PROTEIN: NEGATIVE mg/dL
SPECIFIC GRAVITY, URINE: 1.006 (ref 1.005–1.030)
Urobilinogen, UA: 0.2 mg/dL (ref 0.0–1.0)
pH: 6.5 (ref 5.0–8.0)

## 2013-12-23 LAB — PHENYTOIN LEVEL, TOTAL: Phenytoin Lvl: 8.6 ug/mL — ABNORMAL LOW (ref 10.0–20.0)

## 2013-12-23 LAB — HEPATIC FUNCTION PANEL
ALT: 51 U/L (ref 0–53)
AST: 99 U/L — ABNORMAL HIGH (ref 0–37)
Albumin: 4.2 g/dL (ref 3.5–5.2)
Alkaline Phosphatase: 120 U/L — ABNORMAL HIGH (ref 39–117)
Total Bilirubin: 0.2 mg/dL — ABNORMAL LOW (ref 0.3–1.2)
Total Protein: 8.6 g/dL — ABNORMAL HIGH (ref 6.0–8.3)

## 2013-12-23 LAB — RAPID URINE DRUG SCREEN, HOSP PERFORMED
AMPHETAMINES: NOT DETECTED
BARBITURATES: NOT DETECTED
Benzodiazepines: NOT DETECTED
COCAINE: NOT DETECTED
Opiates: NOT DETECTED
TETRAHYDROCANNABINOL: NOT DETECTED

## 2013-12-23 LAB — ETHANOL: ALCOHOL ETHYL (B): 449 mg/dL — AB (ref 0–11)

## 2013-12-23 LAB — AMMONIA

## 2013-12-23 MED ORDER — SODIUM CHLORIDE 0.9 % IV BOLUS (SEPSIS)
1000.0000 mL | Freq: Once | INTRAVENOUS | Status: AC
  Administered 2013-12-23: 1000 mL via INTRAVENOUS

## 2013-12-23 MED ORDER — AMLODIPINE BESYLATE 10 MG PO TABS
10.0000 mg | ORAL_TABLET | Freq: Once | ORAL | Status: AC
  Administered 2013-12-23: 10 mg via ORAL
  Filled 2013-12-23: qty 1

## 2013-12-23 MED ORDER — PHENYTOIN SODIUM EXTENDED 100 MG PO CAPS
300.0000 mg | ORAL_CAPSULE | Freq: Once | ORAL | Status: AC
  Administered 2013-12-23: 300 mg via ORAL
  Filled 2013-12-23: qty 3

## 2013-12-23 NOTE — ED Provider Notes (Signed)
  Physical Exam  BP 160/90 mmHg  Pulse 106  Temp(Src) 98.8 F (37.1 C) (Oral)  Resp 20  SpO2 100%  Physical Exam  ED Course  Procedures  MDM Care assumed at sign out from Dr. Tamera Punt. Came in last night for alcohol intoxication. ETOH was 490. After observed for about 12 hrs, clinically sober, able to ambulate. HR improved to 105 on d/c. Mother is taking him home. Recently went to detox and now relapse and won't qualify for detox. Alert now, no signs of head injury. Will d/c home.      Wandra Arthurs, MD 12/23/13 1054

## 2013-12-23 NOTE — ED Notes (Signed)
Pt ambulated with Probation officer with one person assist in the hallway.

## 2013-12-23 NOTE — Discharge Instructions (Signed)
Stop drinking alcohol.   Go to detox center. See below.   Follow up with your doctor.   Return to ER if you have vomiting, withdrawals.    Emergency Department Resource Guide 1) Find a Doctor and Pay Out of Pocket Although you won't have to find out who is covered by your insurance plan, it is a good idea to ask around and get recommendations. You will then need to call the office and see if the doctor you have chosen will accept you as a new patient and what types of options they offer for patients who are self-pay. Some doctors offer discounts or will set up payment plans for their patients who do not have insurance, but you will need to ask so you aren't surprised when you get to your appointment.  2) Contact Your Local Health Department Not all health departments have doctors that can see patients for sick visits, but many do, so it is worth a call to see if yours does. If you don't know where your local health department is, you can check in your phone book. The CDC also has a tool to help you locate your state's health department, and many state websites also have listings of all of their local health departments.  3) Find a [facility] If your illness is not likely to be very severe or complicated, you may want to try a walk in clinic. These are popping up all over the country in pharmacies, drugstores, and shopping centers. They're usually staffed by nurse practitioners or physician assistants that have been trained to treat common illnesses and complaints. They're usually fairly quick and inexpensive. However, if you have serious medical issues or chronic medical problems, these are probably not your best option.  No Primary Care Doctor: - Call Health Connect at  [phone] - they can help you locate a primary care doctor that  accepts your insurance, provides certain services, etc. - Physician Referral Service- [phone]  Chronic Pain Problems: Organization          Address  Phone   Notes  [facility]  [phone] Patients need to be referred by their primary care doctor.   Medication Assistance: Organization         Address  Phone   Notes  [facility] Medication [facility] [address] [place], [place] [address] [phone] --Must be a resident of [facility] -- Must have NO insurance coverage whatsoever (no Medicaid/ Medicare, etc.) -- The pt. MUST have a primary care doctor that directs their care regularly and follows them in the community   MedAssist  [phone]   [employer]  [phone]    Agencies that provide inexpensive medical care: Organization         Address  Phone   Notes  [place]  [phone]   [facility]    [phone]   [facility] [place], [place] [address] [phone]   [place] [address], [place] [phone]   Planned Parenthood    [phone]   [facility]    [phone]   [place] and [address], [place] Phone:  [phone], Fax:  [phone] Hours of Operation:  9 am - 6 pm, M-F.  Also accepts Medicaid/Medicare and self-pay.  [facility] for [place] [address]  [place], [place], [place] Phone: [phone], Fax: [phone]. Hours of Operation:  8:30 am - 5:30 pm, M-F.  Also accepts Medicaid and self-pay.  [facility] [place] [address] [place] Phone: [phone]   [place], [place], [place] [phone], Ext. [phone] Mondays & Thursdays: 7-9 AM.  First 15 patients are seen on a first come, first serve basis.    [place] Providers:  Organization         Address  Phone   Notes  [facility] [address], [place] [phone] Also accepts self-pay patients.  [facility] [address], [place]  [phone]   [place], [address], [place] [phone]   [facility] [address] [place] [phone]   Lucianne Lei [address], [place]   [phone] Only accepts [place] Access [place] patients after they have their name applied to their card.   Self-Pay (no insurance) in [facility]:  Organization         Address  Phone   Notes  Sickle Cell Patients, [facility] [place] [phone]   [facility] Urgent Care [place] [phone]   [facility]  [place], [place], [place] [phone]   [facility]/Dr. Osei-Bonsu  [address] [place] or [address], [place] [phone] Phone number for both [place] and [place] locations is the same.  Urgent Medical and [facility] [address], [place] [phone]   [facility] [address] [place] or [address] [phone]   [facility] [address] [place] [phone], phone; [phone], fax Sees patients 1st and 3rd Saturday of every month.  Must not qualify for public or private insurance (i.e. Medicaid, Medicare, [place] Health Choice, Veterans' Benefits)  Household income should be no more than 200% of the poverty level The clinic cannot treat you if you are pregnant or think you are pregnant  Sexually transmitted diseases are not treated at the clinic.    Dental Care: Organization         Address  Phone  Notes  [facility] Department of [facility] [place] [phone] Accepts children up to age 60 who are enrolled in [place] or [place]; pregnant women with a Medicaid card; and  children who have applied for Medicaid or [place] Health Choice, but were declined, whose parents can pay a reduced fee at time of service.  [facility] Department of [facility]  [address], [place] [phone] Accepts children up to age 63 who are enrolled in [place] or [place]; pregnant women with a Medicaid card; and children who have applied for Medicaid or  Health Choice, but were declined, whose parents can pay a reduced fee at time of service.  [place] Adult Dental Access PROGRAM  [place] [phone] Patients are seen by appointment only. Walk-ins are not accepted. Mutual will see patients 43 years of age and older. Monday - Tuesday (8am-5pm) Most Wednesdays (8:30-5pm) $30 per visit, cash only  [place]  [address], [place] [phone] Patients are seen by appointment only. Walk-ins are not accepted. [place] will see patients 31 years of age and older. One Wednesday Evening (Monthly: Volunteer Based).  $30 per visit, cash only  [place]  [phone] for adults; Children under age 53, call Graduate Pediatric Dentistry at [phone]. Children aged 35-14, please call [phone] to request a pediatric application.  Dental services are provided in all areas of dental care including fillings, crowns and bridges, complete and partial dentures, implants, gum treatment, root canals, and extractions. Preventive care is also provided. Treatment is provided to both adults and children. Patients are selected via a lottery and there is often a waiting list.   [facility] [address] [place]  [phone] [email or web address]   [facility] [address] [place], [place] [phone], Ext. [phone] Second and Fourth Thursday of each month, opens at 6:30 AM; Clinic ends at 9 AM.  Patients are seen on a first-come first-served  basis, and a limited number are seen during each clinic.   [facility]  [address] [place] Danker [place], [place] [phone]   Eligibility Requirements You must have lived in [place], [place], or [place] counties for at least the last three months.   You cannot be eligible for state or federal sponsored [employer], including [employer], [place], or [employer].   You generally cannot be eligible for healthcare insurance through your employer.    How to apply: Eligibility screenings are held every Tuesday and Wednesday afternoon from 1:00 pm until 4:00 pm. You do not need an appointment for the interview!  [facility] [address], [place], [place]   [place]  [place] Department  [place]  [phone]    Behavioral Health Resources in the Community: Intensive Outpatient Programs Organization         Address  Phone  Notes  [place] [address], [place], [place] [phone]   [facility] Outpatient [address] [place], [place]   ADS: Alcohol & Drug Svcs [address], [place], [place]   [place] [address],  [place], [place] or [phone]   Substance Abuse Resources Organization         Address  Phone  Notes  Alcohol and Drug Services  [phone]   [place]  [phone]   The [place]   [place]  [phone]   Residential & Outpatient Substance Abuse Program  [phone]   Psychological Services Organization         Address  Phone  Notes  [facility] [place]  [place]  [phone]   [place] [address], [place] or [phone]    Mobile Crisis Teams Organization          Address  Phone  Notes  [facility], Mobile Crisis Care Unit  [phone]   [facility]  [address] [place], [place]   [place] [address], [place] [phone]    Self-Help/Support Groups Organization         Address  Phone             Notes  [place]. of [place] -  variety of support groups  [phone] Call for more information  Narcotics Anonymous (NA), [facility] [address], [place]  2 meetings at this location   Residential Facilities manager         Address  Phone  Notes  [facility] [place],    [place]  [phone]   [facility]  [address] [place] [phone], [place], [place]   [place], [place] [phone] Admissions: 8am-3pm M-F  Incentives Substance [place] [address]    [place], [place] [phone]   [facility] [address] [place], [place], [place]   [facility] [address]  [place], [place]   [facility] Programs - Intensive Outpatient [address], [place], [place]   [facility]  [place], [place] [phone] or [phone]   [facility] Services (RTS) [address], [place], [place] Accepts Medicaid  [facility] [place] [address],  [place] [phone] Substance Abuse/Addiction Treatment   [facility] Organization         Address  Phone  Notes  [facility]  [phone]   Domenic Schwab, PhD [address] [place], [place]   [phone] or [phone]   [place], [place] [phone]   [facility] [address] [place], [place] [phone] Insurance/Medicaid/sponsorship  through [facility] and Families [address] [place]                                    [place], [place] [phone] [place] [address], [place] [phone]    Dr. Adele Schilder  [phone]   [facility] [place] Dept. 1) [address]  2) [place] 3)  [place] [address], [place] [phone]  [phone]   [place] [phone] or [phone] (After Hours)

## 2013-12-23 NOTE — ED Notes (Signed)
Dr. Tamera Punt at bedside speaking with pt and family

## 2013-12-23 NOTE — ED Provider Notes (Signed)
Pt seen with MLP, came in with bizarre behavior.  +ETOH intoxication.  On my exam, pt is moving all extremities symmetrically, talking normally.  States that he feels normal, but still sleepy.  Will continue to monitor.  0712: pt still markedly tachycardic in 110s-120s, will give 2nd liter of fluid and reassess.  No tremor, no confusion.  Mom states that she feels pt close to baseline.  Still a little unsteady on ambulation.  Will turn over to oncoming provider.  Malvin Johns, MD 12/23/13 [phone]

## 2013-12-23 NOTE — ED Notes (Signed)
Pt's mother at bedside.

## 2013-12-23 NOTE — ED Notes (Signed)
Lab called and notified of the add on Hepatic funtion

## 2013-12-25 ENCOUNTER — Ambulatory Visit: Payer: Self-pay | Admitting: Internal Medicine

## 2013-12-25 ENCOUNTER — Encounter: Payer: Self-pay | Admitting: Internal Medicine

## 2013-12-26 ENCOUNTER — Telehealth: Payer: Self-pay | Admitting: *Deleted

## 2013-12-26 DIAGNOSIS — R569 Unspecified convulsions: Secondary | ICD-10-CM

## 2013-12-26 DIAGNOSIS — F10239 Alcohol dependence with withdrawal, unspecified: Secondary | ICD-10-CM

## 2013-12-26 NOTE — Telephone Encounter (Signed)
Pharmacy calls and states generic phenytoin 300mg  not available, they need an alternative, [facility] [phone] fax [phone]

## 2013-12-27 ENCOUNTER — Telehealth: Payer: Self-pay | Admitting: Neurology

## 2013-12-27 ENCOUNTER — Ambulatory Visit: Payer: Self-pay | Admitting: Neurology

## 2013-12-27 MED ORDER — PHENYTOIN SODIUM EXTENDED 100 MG PO CAPS: 300.0000 mg | ORAL_CAPSULE | Freq: Every day | ORAL | Status: DC

## 2013-12-27 NOTE — Telephone Encounter (Signed)
This patient did not show for a new patient appointment today. 

## 2013-12-27 NOTE — Telephone Encounter (Signed)
Thank you for making me aware. I prescribed him the generic phenytoin 100 mg. He can take 3 tabs at night for a total of 300 mg. We switched his medication from TID to QHS for compliance reasons.

## 2013-12-31 ENCOUNTER — Encounter: Payer: Self-pay | Admitting: *Deleted

## 2014-01-06 ENCOUNTER — Other Ambulatory Visit: Payer: Self-pay | Admitting: Internal Medicine

## 2014-01-06 DIAGNOSIS — R569 Unspecified convulsions: Secondary | ICD-10-CM

## 2014-01-29 ENCOUNTER — Other Ambulatory Visit: Payer: Self-pay | Admitting: *Deleted

## 2014-01-31 MED ORDER — AMLODIPINE BESYLATE 10 MG PO TABS: 10.0000 mg | ORAL_TABLET | Freq: Every day | ORAL | Status: DC

## 2014-01-31 MED ORDER — FOLIC ACID 1 MG PO TABS: 1.0000 mg | ORAL_TABLET | Freq: Every day | ORAL | Status: DC

## 2014-03-09 ENCOUNTER — Encounter (HOSPITAL_COMMUNITY): Payer: Self-pay | Admitting: Emergency Medicine

## 2014-03-09 ENCOUNTER — Inpatient Hospital Stay (HOSPITAL_COMMUNITY)
Admission: EM | Admit: 2014-03-09 | Discharge: 2014-03-16 | DRG: 897 | Disposition: A | Discharge status: Home / Self Care | Payer: Self-pay | Attending: Internal Medicine | Admitting: Internal Medicine

## 2014-03-09 ENCOUNTER — Emergency Department (HOSPITAL_COMMUNITY): Payer: Self-pay

## 2014-03-09 DIAGNOSIS — Z720 Tobacco use: Secondary | ICD-10-CM | POA: Diagnosis present

## 2014-03-09 DIAGNOSIS — E871 Hypo-osmolality and hyponatremia: Secondary | ICD-10-CM | POA: Diagnosis present

## 2014-03-09 DIAGNOSIS — I10 Essential (primary) hypertension: Secondary | ICD-10-CM | POA: Diagnosis present

## 2014-03-09 DIAGNOSIS — F10239 Alcohol dependence with withdrawal, unspecified: Principal | ICD-10-CM | POA: Diagnosis present

## 2014-03-09 DIAGNOSIS — Z79899 Other long term (current) drug therapy: Secondary | ICD-10-CM

## 2014-03-09 DIAGNOSIS — F17210 Nicotine dependence, cigarettes, uncomplicated: Secondary | ICD-10-CM | POA: Diagnosis present

## 2014-03-09 DIAGNOSIS — R066 Hiccough: Secondary | ICD-10-CM | POA: Diagnosis present

## 2014-03-09 DIAGNOSIS — F19239 Other psychoactive substance dependence with withdrawal, unspecified: Secondary | ICD-10-CM | POA: Diagnosis present

## 2014-03-09 DIAGNOSIS — D696 Thrombocytopenia, unspecified: Secondary | ICD-10-CM | POA: Diagnosis present

## 2014-03-09 DIAGNOSIS — B9689 Other specified bacterial agents as the cause of diseases classified elsewhere: Secondary | ICD-10-CM | POA: Diagnosis present

## 2014-03-09 DIAGNOSIS — G9340 Encephalopathy, unspecified: Secondary | ICD-10-CM

## 2014-03-09 DIAGNOSIS — A0472 Enterocolitis due to Clostridium difficile, not specified as recurrent: Secondary | ICD-10-CM | POA: Insufficient documentation

## 2014-03-09 DIAGNOSIS — E872 Acidosis: Secondary | ICD-10-CM | POA: Diagnosis present

## 2014-03-09 DIAGNOSIS — F10939 Alcohol use, unspecified with withdrawal, unspecified: Secondary | ICD-10-CM

## 2014-03-09 DIAGNOSIS — S0081XA Abrasion of other part of head, initial encounter: Secondary | ICD-10-CM | POA: Diagnosis present

## 2014-03-09 DIAGNOSIS — N179 Acute kidney failure, unspecified: Secondary | ICD-10-CM | POA: Diagnosis present

## 2014-03-09 DIAGNOSIS — E860 Dehydration: Secondary | ICD-10-CM | POA: Diagnosis present

## 2014-03-09 DIAGNOSIS — E46 Unspecified protein-calorie malnutrition: Secondary | ICD-10-CM | POA: Diagnosis present

## 2014-03-09 DIAGNOSIS — S01512A Laceration without foreign body of oral cavity, initial encounter: Secondary | ICD-10-CM | POA: Diagnosis present

## 2014-03-09 DIAGNOSIS — H1089 Other conjunctivitis: Secondary | ICD-10-CM | POA: Diagnosis present

## 2014-03-09 DIAGNOSIS — D649 Anemia, unspecified: Secondary | ICD-10-CM

## 2014-03-09 DIAGNOSIS — R569 Unspecified convulsions: Secondary | ICD-10-CM

## 2014-03-09 DIAGNOSIS — H1131 Conjunctival hemorrhage, right eye: Secondary | ICD-10-CM | POA: Diagnosis present

## 2014-03-09 DIAGNOSIS — W19XXXA Unspecified fall, initial encounter: Secondary | ICD-10-CM

## 2014-03-09 DIAGNOSIS — Z9114 Patient's other noncompliance with medication regimen: Secondary | ICD-10-CM | POA: Diagnosis present

## 2014-03-09 DIAGNOSIS — Z9119 Patient's noncompliance with other medical treatment and regimen: Secondary | ICD-10-CM | POA: Diagnosis present

## 2014-03-09 DIAGNOSIS — A047 Enterocolitis due to Clostridium difficile: Secondary | ICD-10-CM | POA: Diagnosis present

## 2014-03-09 DIAGNOSIS — E876 Hypokalemia: Secondary | ICD-10-CM | POA: Diagnosis present

## 2014-03-09 LAB — COMPREHENSIVE METABOLIC PANEL
ALT: 59 U/L — ABNORMAL HIGH (ref 0–53)
AST: 154 U/L — AB (ref 0–37)
Albumin: 4 g/dL (ref 3.5–5.2)
Alkaline Phosphatase: 173 U/L — ABNORMAL HIGH (ref 39–117)
Anion gap: 18 — ABNORMAL HIGH (ref 5–15)
BILIRUBIN TOTAL: 1.3 mg/dL — AB (ref 0.3–1.2)
BUN: 8 mg/dL (ref 6–23)
CO2: 27 mmol/L (ref 19–32)
Calcium: 8.3 mg/dL — ABNORMAL LOW (ref 8.4–10.5)
Chloride: 86 mmol/L — ABNORMAL LOW (ref 96–112)
Creatinine, Ser: 1.14 mg/dL (ref 0.50–1.35)
GFR calc Af Amer: 86 mL/min — ABNORMAL LOW (ref >90)
GFR, EST NON AFRICAN AMERICAN: 74 mL/min — AB (ref >90)
Glucose, Bld: 139 mg/dL — ABNORMAL HIGH (ref 70–99)
Potassium: 3 mmol/L — ABNORMAL LOW (ref 3.5–5.1)
Sodium: 131 mmol/L — ABNORMAL LOW (ref 135–145)
Total Protein: 7.6 g/dL (ref 6.0–8.3)

## 2014-03-09 LAB — CBC WITH DIFFERENTIAL/PLATELET
Basophils Absolute: 0 10*3/uL (ref 0.0–0.1)
Basophils Relative: 0 % (ref 0–1)
Eosinophils Absolute: 0 10*3/uL (ref 0.0–0.7)
Eosinophils Relative: 0 % (ref 0–5)
HCT: 35.4 % — ABNORMAL LOW (ref 39.0–52.0)
Hemoglobin: 12.5 g/dL — ABNORMAL LOW (ref 13.0–17.0)
LYMPHS ABS: 0.8 10*3/uL (ref 0.7–4.0)
Lymphocytes Relative: 9 % — ABNORMAL LOW (ref 12–46)
MCH: 34.2 pg — ABNORMAL HIGH (ref 26.0–34.0)
MCHC: 35.3 g/dL (ref 30.0–36.0)
MCV: 96.7 fL (ref 78.0–100.0)
MONOS PCT: 6 % (ref 3–12)
Monocytes Absolute: 0.6 10*3/uL (ref 0.1–1.0)
NEUTROS ABS: 7.2 10*3/uL (ref 1.7–7.7)
NEUTROS PCT: 85 % — AB (ref 43–77)
PLATELETS: 153 10*3/uL (ref 150–400)
RBC: 3.66 MIL/uL — ABNORMAL LOW (ref 4.22–5.81)
RDW: 14.3 % (ref 11.5–15.5)
WBC: 8.6 10*3/uL (ref 4.0–10.5)

## 2014-03-09 LAB — PHENYTOIN LEVEL, TOTAL: Phenytoin Lvl: 6.6 ug/mL — ABNORMAL LOW (ref 10.0–20.0)

## 2014-03-09 MED ORDER — LORAZEPAM 2 MG/ML IJ SOLN: 0.0000 mg | Freq: Two times a day (BID) | INTRAMUSCULAR | Status: DC

## 2014-03-09 MED ORDER — THIAMINE HCL 100 MG/ML IJ SOLN: 100.0000 mg | Freq: Every day | INTRAMUSCULAR | Status: DC

## 2014-03-09 MED ORDER — LORAZEPAM 2 MG/ML IJ SOLN: 0.0000 mg | Freq: Four times a day (QID) | INTRAMUSCULAR | Status: DC

## 2014-03-09 MED ORDER — VITAMIN B-1 100 MG PO TABS: 100.0000 mg | ORAL_TABLET | Freq: Every day | ORAL | Status: DC

## 2014-03-09 MED ORDER — SODIUM CHLORIDE 0.9 % IV SOLN
1000.0000 mg | Freq: Once | INTRAVENOUS | Status: AC
  Administered 2014-03-10: 1000 mg via INTRAVENOUS
  Filled 2014-03-09: qty 20

## 2014-03-09 MED ORDER — SODIUM CHLORIDE 0.9 % IV BOLUS (SEPSIS)
1000.0000 mL | Freq: Once | INTRAVENOUS | Status: AC
  Administered 2014-03-09: 1000 mL via INTRAVENOUS

## 2014-03-09 MED ORDER — LORAZEPAM 2 MG/ML IJ SOLN
1.0000 mg | Freq: Once | INTRAMUSCULAR | Status: AC
  Administered 2014-03-09: 1 mg via INTRAVENOUS
  Filled 2014-03-09: qty 1

## 2014-03-09 MED ORDER — POTASSIUM CHLORIDE 10 MEQ/100ML IV SOLN
10.0000 meq | Freq: Once | INTRAVENOUS | Status: AC
Start: 2014-03-09 — End: 2014-03-10
  Administered 2014-03-09: 10 meq via INTRAVENOUS
  Filled 2014-03-09: qty 100

## 2014-03-09 MED ORDER — LORAZEPAM 2 MG/ML IJ SOLN
2.0000 mg | Freq: Once | INTRAMUSCULAR | Status: AC
  Administered 2014-03-09: 2 mg via INTRAVENOUS
  Filled 2014-03-09: qty 1

## 2014-03-09 MED ORDER — LORAZEPAM 1 MG PO TABS: 0.0000 mg | ORAL_TABLET | Freq: Two times a day (BID) | ORAL | Status: DC

## 2014-03-09 MED ORDER — LORAZEPAM 1 MG PO TABS: 0.0000 mg | ORAL_TABLET | Freq: Four times a day (QID) | ORAL | Status: DC

## 2014-03-09 NOTE — ED Notes (Signed)
Bed: WA21 Expected date: 03/09/14 Expected time: 7:36 PM Means of arrival: Ambulance Comments: Seizure, etoh detox

## 2014-03-09 NOTE — ED Notes (Signed)
Patient reports witnessed seizure for "15 minutes". Patient's mother witnessed patient fall, pt with small hematoma to center or forehead. Denies pain, SOB, lightheadedness, A&O x4. Patient states "when I stop drinking alcohol I have a seizure". Patient reports 1 pint of ETOH a day, no ETOH in 3 days. Patient has mild tremors of bilateral upper extremities. Denies SI/HI, A/VH.

## 2014-03-09 NOTE — ED Provider Notes (Signed)
CSN: [id number]     Arrival date & time 03/09/14  2001 History   First MD Initiated Contact with Patient 03/09/14 2014     Chief Complaint  Patient presents with  . Seizures     (Consider location/radiation/quality/duration/timing/severity/associated sxs/prior Treatment) HPI Joseph Underwood is a 48 year old male with past medical history alcohol abuse, hypertension, alcohol related seizures who presents the ER by EMS with complaint of seizure. EMS reports patient's mother witnessed a tonic-clonic seizure with patient at home which lasted approximately 15 minutes. They reported a postictal period of approximately 30 minutes afterward. Patient's mother called 59, and patient was transported by EMS. Patient reports he has not been completely compliant with his prescribed Dilantin for his seizures, and he attempted to stop drinking approximately 3 days ago. Patient states his last seizure was last year in November when he attempted to stop drinking alcohol at that time. Patient reports associated lightheadedness with standing. Patient states he drinks approximately 1 pint of alcohol a day. Patient denies headache, blurred vision, weakness, chest pain, shortness of breath, abdominal pain, nausea, vomiting, dysuria.  Past Medical History  Diagnosis Date  . Alcohol abuse   . Hiccups   . HTN (hypertension)   . Alcohol related seizure    Past Surgical History  Procedure Laterality Date  . No past surgeries     Family History  Problem Relation Age of Onset  . Hypertension Mother    History  Substance Use Topics  . Smoking status: Current Every Day Smoker -- 0.50 packs/day for 37 years    Types: Cigarettes  . Smokeless tobacco: Never Used  . Alcohol Use: 52.8 oz/week    14 Cans of beer, 74 Shots of liquor per week     Comment: 12/16/2013 "~ 1 pint of liquor plus maybe 2 beers per day"    Review of Systems  Constitutional: Negative for fever.  HENT: Negative for trouble swallowing.   Eyes:  Negative for visual disturbance.  Respiratory: Negative for shortness of breath.   Cardiovascular: Negative for chest pain.  Gastrointestinal: Negative for nausea, vomiting and abdominal pain.       Hiccups  Genitourinary: Negative for dysuria.  Musculoskeletal: Negative for neck pain.  Skin: Negative for rash.  Neurological: Positive for seizures. Negative for dizziness, weakness and numbness.  Psychiatric/Behavioral: Negative.       Allergies  Review of patient's allergies indicates no known allergies.  Home Medications   Prior to Admission medications   Medication Sig Start Date End Date Taking? Authorizing Provider  amLODipine (NORVASC) 10 MG tablet Take 1 tablet (10 mg total) by mouth daily. 01/31/14  Yes Madilyn Fireman, MD  folic acid (FOLVITE) 1 MG tablet Take 1 tablet (1 mg total) by mouth daily. 01/31/14  Yes Madilyn Fireman, MD  phenytoin (DILANTIN) 100 MG ER capsule Take 300 mg by mouth at bedtime.   Yes Historical Provider, MD  ondansetron (ZOFRAN-ODT) 8 MG disintegrating tablet Take 1 tablet (8 mg total) by mouth once. 10/28/13   Carmin Muskrat, MD   BP 168/101 mmHg  Pulse 119  Temp(Src) 99.7 F (37.6 C) (Oral)  Resp 18  SpO2 97% Physical Exam  Constitutional: He is oriented to person, place, and time. He appears well-developed and well-nourished. No distress.  HENT:  Head: Normocephalic and atraumatic.  Mouth/Throat: Oropharynx is clear and moist. No oropharyngeal exudate.  Eyes: EOM are normal. Pupils are equal, round, and reactive to light. Right eye exhibits no discharge. Left eye exhibits no discharge. No  scleral icterus.  Neck: Normal range of motion.  Cardiovascular: Normal rate, regular rhythm and normal heart sounds.   No murmur heard. Pulmonary/Chest: Effort normal and breath sounds normal. No respiratory distress.  Abdominal: Soft. There is no tenderness.  Musculoskeletal: Normal range of motion. He exhibits no edema or tenderness.  Neurological:  He is alert and oriented to person, place, and time. He has normal strength. No cranial nerve deficit or sensory deficit. Coordination normal. GCS eye subscore is 4. GCS verbal subscore is 5. GCS motor subscore is 6.  Patient fully alert, answering questions appropriately in full, clear sentences. Cranial nerves II through XII grossly intact. Motor strength 5 out of 5 in all major muscle groups of upper extremities. Distal sensation intact. Patient has tremors of upper extremities noted with normal coordination.  Skin: Skin is warm and dry. No rash noted. He is not diaphoretic.  Psychiatric: He has a normal mood and affect.  Nursing note and vitals reviewed.   ED Course  Procedures (including critical care time) Labs Review Labs Reviewed  CBC WITH DIFFERENTIAL/PLATELET - Abnormal; Notable for the following:    RBC 3.66 (*)    Hemoglobin 12.5 (*)    HCT 35.4 (*)    MCH 34.2 (*)    Neutrophils Relative % 85 (*)    Lymphocytes Relative 9 (*)    All other components within normal limits  COMPREHENSIVE METABOLIC PANEL - Abnormal; Notable for the following:    Sodium 131 (*)    Potassium 3.0 (*)    Chloride 86 (*)    Glucose, Bld 139 (*)    Calcium 8.3 (*)    AST 154 (*)    ALT 59 (*)    Alkaline Phosphatase 173 (*)    Total Bilirubin 1.3 (*)    GFR calc non Af Amer 74 (*)    GFR calc Af Amer 86 (*)    Anion gap 18 (*)    All other components within normal limits  PHENYTOIN LEVEL, TOTAL - Abnormal; Notable for the following:    Phenytoin Lvl 6.6 (*)    All other components within normal limits  URINE RAPID DRUG SCREEN (HOSP PERFORMED)  ETHANOL  URINALYSIS, ROUTINE W REFLEX MICROSCOPIC    Imaging Review Ct Head Wo Contrast  03/10/2014   CLINICAL DATA:  Weakness seizure for 15 min. Patient fell with hematoma to the center of forehead. Patient typically drinks 1 pint per day and has not had anything to drink in 3 days.  EXAM: CT HEAD WITHOUT CONTRAST  TECHNIQUE: Contiguous axial  images were obtained from the base of the skull through the vertex without intravenous contrast.  COMPARISON:  12/16/2013  FINDINGS: Mild diffuse cerebral atrophy. Mild prominence of ventricles consistent with central atrophy. No mass effect or midline shift. No abnormal extra-axial fluid collections. Gray-white matter junctions are distinct. Basal cisterns are not effaced. No evidence of acute intracranial hemorrhage. No depressed skull fractures. Mucosal thickening in the right maxillary antrum. Mastoid air cells are not opacified.  IMPRESSION: No acute intracranial abnormalities. Mild diffuse cerebral atrophy. No change since prior study.   Electronically Signed   By: Lucienne Capers M.D.   On: 03/10/2014 00:12     EKG Interpretation   Date/Time:  Sunday March 09 2014 20:16:11 EST Ventricular Rate:  134 PR Interval:  36 QRS Duration: 70 QT Interval:  395 QTC Calculation: 590 R Axis:   94 Text Interpretation:  Sinus tachycardia Ventricular premature complex LAE,  consider biatrial enlargement  Borderline right axis deviation Anteroseptal  infarct, old Abnrm T, consider ischemia, anterolateral lds Prolonged QT  interval No significant change was found Confirmed by CAMPOS  MD, Lennette Bihari  ([id number]) on 03/09/2014 11:21:21 PM      MDM   Final diagnoses:  Fall  Seizure  Alcohol withdrawal seizure, with unspecified complication    Patient here status post seizure at home. She will history of seizures and reports noncompliance with medication, stating he sometimes takes medication. Patient states he missed medication dose last night of Dilantin. Patient reports also trying to stop drinking alcohol, states last request 3 days ago. Neuro exam benign. Patient fully alert, answering questions appropriately on exam for me. We'll follow-up with CT head due to patient's witnessed fall by his mother. Patient placed on CIWA protocol due to withdrawal symptoms, tachycardia. Patient admitted for withdrawal  seizure. Patient's primary care with internal medicine residents, patient was [facility] to [facility] for further admission and workup.  BP 168/101 mmHg  Pulse 119  Temp(Src) 99.7 F (37.6 C) (Oral)  Resp 18  SpO2 97%  Signed,  Dahlia Bailiff, PA-C 1:51 AM  Patient seen and discussed with Dr. Jola Schmidt, MD     Carrie Mew, PA-C 03/10/14 [place], MD 03/12/14 [phone]

## 2014-03-09 NOTE — ED Notes (Signed)
Patient presents from home via EMS for witnessed seizure. A&O x4. Hematoma to center of forehead. Denies head, neck, back pain. No N/V. Dizziness upon standing. Hx of seizures. Hx of alcohol abuse. Phenytoin for seizures.   Per EMS VS 120Hr, 156/100, 16resp, 96% RA, CBG 127.  20G wrist.

## 2014-03-10 DIAGNOSIS — R251 Tremor, unspecified: Secondary | ICD-10-CM

## 2014-03-10 DIAGNOSIS — T510X1A Toxic effect of ethanol, accidental (unintentional), initial encounter: Secondary | ICD-10-CM

## 2014-03-10 DIAGNOSIS — F17210 Nicotine dependence, cigarettes, uncomplicated: Secondary | ICD-10-CM

## 2014-03-10 DIAGNOSIS — E876 Hypokalemia: Secondary | ICD-10-CM | POA: Diagnosis present

## 2014-03-10 DIAGNOSIS — R569 Unspecified convulsions: Secondary | ICD-10-CM

## 2014-03-10 DIAGNOSIS — F19939 Other psychoactive substance use, unspecified with withdrawal, unspecified: Secondary | ICD-10-CM | POA: Diagnosis present

## 2014-03-10 DIAGNOSIS — S01512A Laceration without foreign body of oral cavity, initial encounter: Secondary | ICD-10-CM

## 2014-03-10 DIAGNOSIS — X58XXXA Exposure to other specified factors, initial encounter: Secondary | ICD-10-CM

## 2014-03-10 DIAGNOSIS — I10 Essential (primary) hypertension: Secondary | ICD-10-CM

## 2014-03-10 DIAGNOSIS — F10239 Alcohol dependence with withdrawal, unspecified: Principal | ICD-10-CM

## 2014-03-10 DIAGNOSIS — R748 Abnormal levels of other serum enzymes: Secondary | ICD-10-CM

## 2014-03-10 DIAGNOSIS — F19239 Other psychoactive substance dependence with withdrawal, unspecified: Secondary | ICD-10-CM | POA: Diagnosis present

## 2014-03-10 DIAGNOSIS — R740 Nonspecific elevation of levels of transaminase and lactic acid dehydrogenase [LDH]: Secondary | ICD-10-CM

## 2014-03-10 DIAGNOSIS — W19XXXA Unspecified fall, initial encounter: Secondary | ICD-10-CM

## 2014-03-10 DIAGNOSIS — R000 Tachycardia, unspecified: Secondary | ICD-10-CM

## 2014-03-10 DIAGNOSIS — G4089 Other seizures: Secondary | ICD-10-CM

## 2014-03-10 DIAGNOSIS — E872 Acidosis: Secondary | ICD-10-CM

## 2014-03-10 DIAGNOSIS — Z9114 Patient's other noncompliance with medication regimen: Secondary | ICD-10-CM

## 2014-03-10 DIAGNOSIS — N179 Acute kidney failure, unspecified: Secondary | ICD-10-CM

## 2014-03-10 HISTORY — DX: Unspecified convulsions: R56.9

## 2014-03-10 LAB — COMPREHENSIVE METABOLIC PANEL
ALK PHOS: 154 U/L — AB (ref 39–117)
ALK PHOS: 132 U/L — AB (ref 39–117)
ALT: 53 U/L (ref 0–53)
ALT: 44 U/L (ref 0–53)
AST: 124 U/L — ABNORMAL HIGH (ref 0–37)
AST: 98 U/L — AB (ref 0–37)
Albumin: 3.7 g/dL (ref 3.5–5.2)
Albumin: 3.1 g/dL — ABNORMAL LOW (ref 3.5–5.2)
Anion gap: 15 (ref 5–15)
Anion gap: 16 — ABNORMAL HIGH (ref 5–15)
BUN: <5 mg/dL — ABNORMAL LOW (ref 6–23)
CALCIUM: 7.7 mg/dL — AB (ref 8.4–10.5)
CO2: 30 mmol/L (ref 19–32)
CO2: 26 mmol/L (ref 19–32)
Calcium: 8.2 mg/dL — ABNORMAL LOW (ref 8.4–10.5)
Chloride: 91 mmol/L — ABNORMAL LOW (ref 96–112)
Chloride: 94 mmol/L — ABNORMAL LOW (ref 96–112)
Creatinine, Ser: 0.92 mg/dL (ref 0.50–1.35)
Creatinine, Ser: 0.76 mg/dL (ref 0.50–1.35)
GFR calc non Af Amer: >90 mL/min (ref >90)
GLUCOSE: 90 mg/dL (ref 70–99)
Glucose, Bld: 116 mg/dL — ABNORMAL HIGH (ref 70–99)
POTASSIUM: 3.7 mmol/L (ref 3.5–5.1)
Potassium: 2.9 mmol/L — ABNORMAL LOW (ref 3.5–5.1)
Sodium: 136 mmol/L (ref 135–145)
Sodium: 136 mmol/L (ref 135–145)
Total Bilirubin: 1.7 mg/dL — ABNORMAL HIGH (ref 0.3–1.2)
Total Bilirubin: 1.4 mg/dL — ABNORMAL HIGH (ref 0.3–1.2)
Total Protein: 7.5 g/dL (ref 6.0–8.3)
Total Protein: 6.3 g/dL (ref 6.0–8.3)

## 2014-03-10 LAB — RAPID URINE DRUG SCREEN, HOSP PERFORMED
Amphetamines: NOT DETECTED
Barbiturates: NOT DETECTED
Benzodiazepines: NOT DETECTED
Cocaine: NOT DETECTED
Opiates: NOT DETECTED
Tetrahydrocannabinol: NOT DETECTED

## 2014-03-10 LAB — URINALYSIS, ROUTINE W REFLEX MICROSCOPIC
Bilirubin Urine: NEGATIVE
GLUCOSE, UA: NEGATIVE mg/dL
Ketones, ur: NEGATIVE mg/dL
Leukocytes, UA: NEGATIVE
Nitrite: NEGATIVE
Protein, ur: 30 mg/dL — AB
Specific Gravity, Urine: 1.008 (ref 1.005–1.030)
Urobilinogen, UA: 1 mg/dL (ref 0.0–1.0)
pH: 7 (ref 5.0–8.0)

## 2014-03-10 LAB — TROPONIN I
TROPONIN I: 0.06 ng/mL — AB (ref <0.031)
Troponin I: 0.07 ng/mL — ABNORMAL HIGH (ref <0.031)
Troponin I: 0.06 ng/mL — ABNORMAL HIGH (ref <0.031)

## 2014-03-10 LAB — URINE MICROSCOPIC-ADD ON

## 2014-03-10 LAB — HEPATITIS PANEL, ACUTE
HCV Ab: NEGATIVE
Hep A IgM: NONREACTIVE
Hep B C IgM: NONREACTIVE
Hepatitis B Surface Ag: NEGATIVE

## 2014-03-10 LAB — TSH: TSH: 0.664 u[IU]/mL (ref 0.350–4.500)

## 2014-03-10 LAB — CBC
HCT: 35.3 % — ABNORMAL LOW (ref 39.0–52.0)
Hemoglobin: 12.4 g/dL — ABNORMAL LOW (ref 13.0–17.0)
MCH: 33.7 pg (ref 26.0–34.0)
MCHC: 35.1 g/dL (ref 30.0–36.0)
MCV: 95.9 fL (ref 78.0–100.0)
Platelets: 136 10*3/uL — ABNORMAL LOW (ref 150–400)
RBC: 3.68 MIL/uL — ABNORMAL LOW (ref 4.22–5.81)
RDW: 14.4 % (ref 11.5–15.5)
WBC: 9.6 10*3/uL (ref 4.0–10.5)

## 2014-03-10 LAB — MAGNESIUM
Magnesium: 1.1 mg/dL — ABNORMAL LOW (ref 1.5–2.5)
Magnesium: 1.5 mg/dL (ref 1.5–2.5)

## 2014-03-10 LAB — MRSA PCR SCREENING: MRSA by PCR: NEGATIVE

## 2014-03-10 LAB — LACTIC ACID, PLASMA: Lactic Acid, Venous: 1.6 mmol/L (ref 0.5–2.0)

## 2014-03-10 LAB — KETONES, QUALITATIVE: Acetone, Bld: NEGATIVE

## 2014-03-10 LAB — CK: Total CK: 304 U/L — ABNORMAL HIGH (ref 7–232)

## 2014-03-10 LAB — PHOSPHORUS: Phosphorus: 4.5 mg/dL (ref 2.3–4.6)

## 2014-03-10 MED ORDER — FOLIC ACID 1 MG PO TABS
1.0000 mg | ORAL_TABLET | Freq: Every day | ORAL | Status: DC
  Administered 2014-03-11: 1 mg via ORAL
  Filled 2014-03-10 (×2): qty 1

## 2014-03-10 MED ORDER — AMLODIPINE BESYLATE 10 MG PO TABS
10.0000 mg | ORAL_TABLET | Freq: Every day | ORAL | Status: DC
  Administered 2014-03-10 – 2014-03-11 (×2): 10 mg via ORAL
  Filled 2014-03-10 (×3): qty 1

## 2014-03-10 MED ORDER — HEPARIN SODIUM (PORCINE) 5000 UNIT/ML IJ SOLN
5000.0000 [IU] | Freq: Three times a day (TID) | INTRAMUSCULAR | Status: DC
  Administered 2014-03-10 – 2014-03-12 (×5): 5000 [IU] via SUBCUTANEOUS
  Filled 2014-03-10 (×10): qty 1

## 2014-03-10 MED ORDER — SODIUM CHLORIDE 0.9 % IJ SOLN: 3.0000 mL | Freq: Two times a day (BID) | INTRAMUSCULAR | Status: DC

## 2014-03-10 MED ORDER — PHENYTOIN SODIUM EXTENDED 100 MG PO CAPS
300.0000 mg | ORAL_CAPSULE | Freq: Every day | ORAL | Status: DC
  Filled 2014-03-10 (×2): qty 3

## 2014-03-10 MED ORDER — LORAZEPAM 2 MG/ML IJ SOLN
0.0000 mg | Freq: Four times a day (QID) | INTRAMUSCULAR | Status: DC
Start: 2014-03-10 — End: 2014-03-11
  Administered 2014-03-10: 1 mg via INTRAVENOUS
  Administered 2014-03-11 (×3): 2 mg via INTRAVENOUS
  Administered 2014-03-11: 1 mg via INTRAVENOUS
  Filled 2014-03-10 (×4): qty 1

## 2014-03-10 MED ORDER — THIAMINE HCL 100 MG/ML IJ SOLN: 100.0000 mg | Freq: Every day | INTRAMUSCULAR | Status: DC

## 2014-03-10 MED ORDER — THIAMINE HCL 100 MG/ML IJ SOLN
Freq: Once | INTRAVENOUS | Status: DC
  Filled 2014-03-10: qty 1000

## 2014-03-10 MED ORDER — LORAZEPAM 1 MG PO TABS
1.0000 mg | ORAL_TABLET | Freq: Once | ORAL | Status: DC
  Filled 2014-03-10: qty 1

## 2014-03-10 MED ORDER — MAGNESIUM SULFATE 2 GM/50ML IV SOLN
2.0000 g | Freq: Once | INTRAVENOUS | Status: AC
  Administered 2014-03-10: 2 g via INTRAVENOUS
  Filled 2014-03-10: qty 50

## 2014-03-10 MED ORDER — ERYTHROMYCIN 5 MG/GM OP OINT
TOPICAL_OINTMENT | Freq: Every day | OPHTHALMIC | Status: DC
  Administered 2014-03-10: 1 via OPHTHALMIC
  Administered 2014-03-11 – 2014-03-12 (×2): via OPHTHALMIC
  Filled 2014-03-10 (×2): qty 3.5

## 2014-03-10 MED ORDER — ACETAMINOPHEN 325 MG PO TABS: 650.0000 mg | ORAL_TABLET | Freq: Four times a day (QID) | ORAL | Status: DC | PRN

## 2014-03-10 MED ORDER — POTASSIUM CHLORIDE CRYS ER 20 MEQ PO TBCR
40.0000 meq | EXTENDED_RELEASE_TABLET | Freq: Once | ORAL | Status: AC
  Administered 2014-03-10: 40 meq via ORAL
  Filled 2014-03-10: qty 2

## 2014-03-10 MED ORDER — POTASSIUM CHLORIDE CRYS ER 20 MEQ PO TBCR
40.0000 meq | EXTENDED_RELEASE_TABLET | Freq: Two times a day (BID) | ORAL | Status: DC
  Administered 2014-03-10: 40 meq via ORAL
  Filled 2014-03-10: qty 2

## 2014-03-10 MED ORDER — LORAZEPAM 2 MG/ML IJ SOLN: 2.0000 mg | INTRAMUSCULAR | Status: DC | PRN

## 2014-03-10 MED ORDER — LORAZEPAM 2 MG/ML IJ SOLN: 0.0000 mg | Freq: Two times a day (BID) | INTRAMUSCULAR | Status: DC

## 2014-03-10 MED ORDER — POTASSIUM CHLORIDE 10 MEQ/100ML IV SOLN: 10.0000 meq | INTRAVENOUS | Status: DC

## 2014-03-10 MED ORDER — ADULT MULTIVITAMIN W/MINERALS CH
1.0000 | ORAL_TABLET | Freq: Every day | ORAL | Status: DC
  Administered 2014-03-11 – 2014-03-16 (×5): 1 via ORAL
  Filled 2014-03-10 (×6): qty 1

## 2014-03-10 MED ORDER — LORAZEPAM 2 MG/ML IJ SOLN
1.0000 mg | Freq: Four times a day (QID) | INTRAMUSCULAR | Status: DC | PRN
  Filled 2014-03-10 (×2): qty 1

## 2014-03-10 MED ORDER — POTASSIUM CHLORIDE 10 MEQ/100ML IV SOLN
10.0000 meq | INTRAVENOUS | Status: AC
  Administered 2014-03-10 (×2): 10 meq via INTRAVENOUS
  Filled 2014-03-10: qty 100

## 2014-03-10 MED ORDER — SODIUM CHLORIDE 0.9 % IV SOLN: INTRAVENOUS | Status: AC

## 2014-03-10 MED ORDER — ONDANSETRON HCL 4 MG PO TABS: 4.0000 mg | ORAL_TABLET | Freq: Four times a day (QID) | ORAL | Status: DC | PRN

## 2014-03-10 MED ORDER — SODIUM CHLORIDE 0.9 % IJ SOLN
3.0000 mL | Freq: Two times a day (BID) | INTRAMUSCULAR | Status: DC
  Administered 2014-03-10 – 2014-03-14 (×8): 3 mL via INTRAVENOUS

## 2014-03-10 MED ORDER — THIAMINE HCL 100 MG/ML IJ SOLN
Freq: Once | INTRAVENOUS | Status: AC
  Administered 2014-03-10: 03:00:00 via INTRAVENOUS
  Filled 2014-03-10: qty 1000

## 2014-03-10 MED ORDER — ENOXAPARIN SODIUM 40 MG/0.4ML ~~LOC~~ SOLN
40.0000 mg | SUBCUTANEOUS | Status: DC
Start: 2014-03-10 — End: 2014-03-10
  Filled 2014-03-10: qty 0.4

## 2014-03-10 MED ORDER — LORAZEPAM 2 MG/ML IJ SOLN: 1.0000 mg | Freq: Once | INTRAMUSCULAR | Status: DC

## 2014-03-10 MED ORDER — ACETAMINOPHEN 650 MG RE SUPP: 650.0000 mg | Freq: Four times a day (QID) | RECTAL | Status: DC | PRN

## 2014-03-10 MED ORDER — ONDANSETRON HCL 4 MG/2ML IJ SOLN: 4.0000 mg | Freq: Four times a day (QID) | INTRAMUSCULAR | Status: DC | PRN

## 2014-03-10 MED ORDER — LORAZEPAM 2 MG/ML IJ SOLN
INTRAMUSCULAR | Status: AC
  Filled 2014-03-10: qty 1

## 2014-03-10 MED ORDER — LORAZEPAM 1 MG PO TABS
1.0000 mg | ORAL_TABLET | Freq: Four times a day (QID) | ORAL | Status: DC | PRN
  Administered 2014-03-11: 1 mg via ORAL

## 2014-03-10 MED ORDER — SODIUM CHLORIDE 0.9 % IV SOLN
INTRAVENOUS | Status: AC
  Administered 2014-03-10: 14:00:00 via INTRAVENOUS

## 2014-03-10 MED ORDER — FOLIC ACID 5 MG/ML IJ SOLN: 1.0000 mg | Freq: Every day | INTRAMUSCULAR | Status: DC

## 2014-03-10 MED ORDER — LORAZEPAM 2 MG/ML IJ SOLN
1.0000 mg | Freq: Once | INTRAMUSCULAR | Status: AC
  Administered 2014-03-10: 1 mg via INTRAVENOUS
  Filled 2014-03-10: qty 1

## 2014-03-10 NOTE — ED Notes (Signed)
Patient pull IV out. One unsuccessful attempt by this Probation officer. Carelink to attempt.

## 2014-03-10 NOTE — H&P (Signed)
Date: 03/10/2014               Patient Name:  Joseph Underwood MRN: [id number]  DOB: 03/27/1965 Age / Sex: 49 y.o., male   PCP: Drucilla Schmidt, MD         Medical Service: Internal Medicine Teaching Service         Attending Physician: Dr. Aldine Contes, MD    First Contact: Dr. Osa Craver Pager: [phone]  Second Contact: Dr. Natasha Bence Pager: [phone]       After Hours (After 5p/  First Contact Pager: [phone]  weekends / holidays): Second Contact Pager: [phone]   Chief Complaint: seizure  History of Present Illness: Joseph Underwood is a 49 year old man with history of alcohol abuse and related seizures, HTN who presented to the [facility] with a witnessed seizure. History was limited as the patient did not recall the event and his mother was not available at the time of interview; it was primarily obtained by chart review. He was in his usual state of health when his mother witnessed a tonic-clonic seizure she thought lasted about 15 minutes with a subsequent postictal period of 30 minutes. He says he does not remember this event and the first thing he remembers is lying on the floor. Per the EMR, his mother saw him hit his head but unclear on what. He denied any urinary or bladder incontinence. Joseph Underwood reported that he is not compliant with his prescribed phenytoin. His last drink was yesterday and he says he drinks about a pint of liquor a day. He was recently admitted in 12/2013 with a seizure in the setting of attempted alcohol cessation. He notes he has chronic hiccups. He says he only eats about one meal a day and named cabbage and bbq chicken as examples. He denies any chest pain, SOB, palpitations, fevers, chills, night sweats, headache, numbness, weakness, abdominal pain, nausea, emesis, diarrhea, constipation.  In the ED, he received phenytoin 1000 mg iv once, ativan 3 mg iv, 1 L NS, and KCL 10 mEq iv.  Meds: Current Facility-Administered Medications  Medication Dose Route  Frequency Provider Last Rate Last Dose  . 0.9 %  sodium chloride infusion   Intravenous Continuous Toy Baker, MD      . acetaminophen (TYLENOL) tablet 650 mg  650 mg Oral Q6H PRN Toy Baker, MD       Or  . acetaminophen (TYLENOL) suppository 650 mg  650 mg Rectal Q6H PRN Toy Baker, MD      . amLODipine (NORVASC) tablet 10 mg  10 mg Oral Daily Toy Baker, MD      . Derrill Memo ON 7/41/2878] folic acid (FOLVITE) tablet 1 mg  1 mg Oral Daily Otho Bellows, MD      . heparin injection 5,000 Units  5,000 Units Subcutaneous 3 times per day Otho Bellows, MD      . LORazepam (ATIVAN) 2 MG/ML injection           . LORazepam (ATIVAN) injection 0-4 mg  0-4 mg Intravenous Q6H Otho Bellows, MD       Followed by  . [START ON 03/12/2014] LORazepam (ATIVAN) injection 0-4 mg  0-4 mg Intravenous Q12H Otho Bellows, MD      . LORazepam (ATIVAN) tablet 1 mg  1 mg Oral Q6H PRN Otho Bellows, MD       Or  . LORazepam (ATIVAN) injection 1 mg  1 mg Intravenous Q6H PRN Brett Canales  Eulas Post, MD      . Derrill Memo ON 03/11/2014] multivitamin with minerals tablet 1 tablet  1 tablet Oral Daily Otho Bellows, MD      . ondansetron [facility]) tablet 4 mg  4 mg Oral Q6H PRN Toy Baker, MD       Or  . ondansetron (ZOFRAN) injection 4 mg  4 mg Intravenous Q6H PRN Toy Baker, MD      . phenytoin (DILANTIN) ER capsule 300 mg  300 mg Oral QHS Toy Baker, MD   0 mg at 03/10/14 0329  . potassium chloride SA (K-DUR,KLOR-CON) CR tablet 40 mEq  40 mEq Oral BID Aldine Contes, MD   40 mEq at 03/10/14 0337  . sodium chloride 0.9 % injection 3 mL  3 mL Intravenous Q12H Otho Bellows, MD   3 mL at 03/10/14 0315    Allergies: Allergies as of 03/09/2014  . (No Known Allergies)   Past Medical History  Diagnosis Date  . Alcohol abuse   . Hiccups   . HTN (hypertension)   . Alcohol related seizure    Past Surgical History  Procedure Laterality Date  . No past surgeries      Family History  Problem Relation Age of Onset  . Hypertension Mother    History   Social History  . Marital Status: Single    Spouse Name: N/A    Number of Children: N/A  . Years of Education: N/A   Occupational History  . Not on file.   Social History Main Topics  . Smoking status: Current Every Day Smoker -- 0.50 packs/day for 37 years    Types: Cigarettes  . Smokeless tobacco: Never Used  . Alcohol Use: 52.8 oz/week    14 Cans of beer, 74 Shots of liquor per week     Comment: 12/16/2013 "~ 1 pint of liquor plus maybe 2 beers per day"  . Drug Use: No  . Sexual Activity: No   Other Topics Concern  . Not on file   Social History Narrative    Review of Systems: A comprehensive review of systems was negative except for: as noted above per HPI  Physical Exam: Blood pressure 157/107, pulse 124, temperature 99.1 F (37.3 C), temperature source Oral, resp. rate 30, height 5\' 5"  (1.651 m), weight 106 lb 0.7 oz (48.1 kg), SpO2 98 %.  Gen: A&O x 4, no acute distress, tremulous, cachetic HEENT: R frontal head abrasion, PERRL, R eye with subconjunctival hemorrhage and injected, EOMI, sclerae anicteric, R distal tongue laceration, moist mucous membranes, poor dentition Heart: Tachycardic but regular, normal S1 S2, no murmurs, rubs, or gallops Lungs: Clear to auscultation bilaterally, respirations unlabored Abd: Soft, non-tender, non-distended, + bowel sounds, no hepatosplenomegaly Ext: No edema or cyanosis Neuro: A&O x 4, CN II-XII intact, resting and intention tremor, strength 5/5 and symmetric b/l, sensation grossly intact  Lab results: Basic Metabolic Panel:  Recent Labs  03/09/14 2035  NA 131*  K 3.0*  CL 86*  CO2 27  GLUCOSE 139*  BUN 8  CREATININE 1.14  CALCIUM 8.3*   Liver Function Tests:  Recent Labs  03/09/14 2035  AST 154*  ALT 59*  ALKPHOS 173*  BILITOT 1.3*  PROT 7.6  ALBUMIN 4.0   CBC:  Recent Labs  03/09/14 2035  WBC 8.6  NEUTROABS  7.2  HGB 12.5*  HCT 35.4*  MCV 96.7  PLT 153   Urine Drug Screen: Drugs of Abuse     Component Value Date/Time  LABOPIA NONE DETECTED 03/10/2014 0226   COCAINSCRNUR NONE DETECTED 03/10/2014 0226   LABBENZ NONE DETECTED 03/10/2014 0226   AMPHETMU NONE DETECTED 03/10/2014 0226   THCU NONE DETECTED 03/10/2014 0226   LABBARB NONE DETECTED 03/10/2014 0226    Urinalysis:  Recent Labs  03/10/14 0226  COLORURINE YELLOW  LABSPEC 1.008  PHURINE 7.0  GLUCOSEU NEGATIVE  HGBUR TRACE*  BILIRUBINUR NEGATIVE  KETONESUR NEGATIVE  PROTEINUR 30*  UROBILINOGEN 1.0  NITRITE NEGATIVE  LEUKOCYTESUR NEGATIVE  hyaline casts  Phenytoin level 6.6 (low)  Imaging results:  Ct Head Wo Contrast  03/10/2014   CLINICAL DATA:  Weakness seizure for 15 min. Patient fell with hematoma to the center of forehead. Patient typically drinks 1 pint per day and has not had anything to drink in 3 days.  EXAM: CT HEAD WITHOUT CONTRAST  TECHNIQUE: Contiguous axial images were obtained from the base of the skull through the vertex without intravenous contrast.  COMPARISON:  12/16/2013  FINDINGS: Mild diffuse cerebral atrophy. Mild prominence of ventricles consistent with central atrophy. No mass effect or midline shift. No abnormal extra-axial fluid collections. Gray-white matter junctions are distinct. Basal cisterns are not effaced. No evidence of acute intracranial hemorrhage. No depressed skull fractures. Mucosal thickening in the right maxillary antrum. Mastoid air cells are not opacified.  IMPRESSION: No acute intracranial abnormalities. Mild diffuse cerebral atrophy. No change since prior study.   Electronically Signed   By: Lucienne Capers M.D.   On: 03/10/2014 00:12    Other results: EKG: sinus tachycardia, t-wave depression in V5, QTc 590, right axis deviation, compared to prior 12/22/13.   Assessment & Plan by Problem: Principal Problem:   Alcohol withdrawal seizure Active Problems:   Seizures    Essential hypertension, benign   Tobacco dependence   Protein-calorie malnutrition   Alcohol abuse   Hypokalemia   Withdrawal seizures   Fall  #Seizure 2/2 Alcohol Withdrawal: Joseph Underwood had a witnessed seizure in the setting of alcohol withdrawal as well as non-compliance. Although he had a witnessed fall, his head CT was unchanged from prior with no acute intracranial abnormalities but mild diffuse cerebral atrophy. Also evidence of seizure with tongue laceration, forehead abrasion, and R subconjunctival hemorrhage. His last drink was yesterday and he has had seizures in the setting of withdrawal before. He was tachycardic and very tremulous on exam, suggesting withdrawal. His elevated transaminases of AST ALT 154 and 59, respectively, suggest alcohol abuse. He also admits he does not take his phenytoin as prescribed and his level today was low (6.6). He was loaded with phenytoin in the ED. UDS negative. Unlikely cardiac as he denies any symptoms and changes on EKG nonspecific but will check troponin and recheck EKG -phenytoin per pharmacy -repeat phenytoin level -troponin -TSH -CK -repeat EKG in am -lactic acid level -CIWA protocol -MVI -folic acid 1 mg po daily -thiamine 100 mg po or iv daily -cardiac monitoring  #Anion gap metabolic acidosis: Joseph Underwood had a presenting anion gap of 18. His bicarb was normal at 27 so he may have compensated respiration. Calculated serum osmolality of 279 is low normal. He may have a ketoacidosis related to his chronic alcohol use as his gap has always been elevated in our EMR. Lactic acid may be elevated as he had a seizure. -ketone -lactic acid -reassess in am  #Mild Hyponatremia: Joseph Underwood had a presenting sodium of 131, corrected to 132 considering elevated glucose. His calculated serum osmolality of 279 is low normal. This is likely  in the setting of his alcohol intoxication (beer potomania). His renal function is only mildly worse than baseline as  his presenting creatinine was 1.14 with a baseline ~ 0.9. -NS @ 100 cc/hr -reassess in am  #Hypokalemia: Joseph Underwood had a presenting K of 3.0. The cause is unclear. He received KCL 10 mEq iv once in the [facility] prior to [facility]. The cause is unclear -check Mg -KDur 40 mEq once -reassess in am  #AKI: Presenting creatinine of 1.14 and was previously 0.67 in 12/2013. He is likely dehydrated in the setting of alcohol abuse -NS @ 100 cc/hr -reassess in am  #Elevated Transaminases: Joseph Underwood's elevated transaminases of AST ALT 154 and 59, respectively, suggest alcohol abuse. They were last measured 12/2013 with a similar pattern of AST ALT 99 and 51, respectively. His last hepatitis panel in 04/2010 was hep A IgM negative, hep B surface antigen negative, hep b core IgM negative and HCV antibody negative. He currently denies any abdominal symptoms. He had a CT abd pelvis 08/2013 notable for fatty infiltrates of the liver and gallbladder adequately distended with no evidence of stones or acute inflammatory changes. -hepatitis panel -recheck in am  #Elevated Alkaline Phosphatase: Joseph Underwood's presenting alkaline phosphatase of 173 is up from 120 in 12/2013. The trend appears that his alkaline phosphatase has increased over the past two years from a previous baseline of ~ 70. He had a CT abd pelvis 08/2013 notable for fatty infiltrates of the liver and gallbladder adequately distended with no evidence of stones or acute inflammatory changes. -recheck in am  #HTN: Joseph. Underwood blood pressure was in the 120s-160s/80s-100s at [facility]. This was in the setting of alcohol withdrawal and likely non-compliance with his medications. At home he takes amlodipine 10 mg daily. -cont amlodipine 10 mg daily  #Diet: NPO  #DVT PPx: heparin 5000 u [place] tid  #Code: full  Dispo: Disposition is deferred at this time, awaiting improvement of current medical problems. Anticipated discharge in approximately 2 day(s).   The patient  does have a current PCP Drucilla Schmidt, MD) and does need an [facility] hospital follow-up appointment after discharge.  The patient does not know have transportation limitations that hinder transportation to clinic appointments.  Signed: Kelby Aline, MD 03/10/2014, 3:42 AM    ADDENDUM: Troponin i returned 0.07. I spoke with Dr Wynonia Lawman who recommended cycling troponins to see if there is a rise as his clinical picture does not suggest cardiac event. He also noted that a seizure can cause elevated troponin if there is muscle activity.  Mg returned 1.1 which may explain hypokalemia. Ordered magnesium sulfate 2 g iv  Lottie Mussel, MD 03/10/14 4:46 am

## 2014-03-10 NOTE — Progress Notes (Signed)
MEDICATION RELATED CONSULT NOTE - INITIAL   Pharmacy Consult for Phenytoin  Indication: Seizures   No Known Allergies  Patient Measurements: Height: 5\' 5"  (165.1 cm) Weight: 106 lb 0.7 oz (48.1 kg) IBW/kg (Calculated) : 61.5  Vital Signs: Temp: 99.7 F (37.6 C) (01/24 2016) Temp Source: Oral (01/24 2016) BP: 157/107 mmHg (01/25 0300) Pulse Rate: 124 (01/25 0300)  Labs:  Recent Labs  03/09/14 2035  WBC 8.6  HGB 12.5*  HCT 35.4*  PLT 153  CREATININE 1.14  ALBUMIN 4.0  PROT 7.6  AST 154*  ALT 59*  ALKPHOS 173*  BILITOT 1.3*   Medical History: Past Medical History  Diagnosis Date  . Alcohol abuse   . Hiccups   . HTN (hypertension)   . Alcohol related seizure     Assessment: 49 y/o M on dilantin PTA with ?compliacne admitted with witnessed seizure. Phenytoin level was 6.6, so pt is taking some phenytoin. Albumin was normal. Pt was re-loaded with phenytoin at [facility] with 1000 mg IV (fairly aggressive load given level of 6.6 and weight of 46 kg).   Goal of Therapy:  Phenytoin level 10-20 mg/L  Plan:  -Will check phenytoin level tonight before re-starting home dose  Narda Bonds 03/10/2014,3:31 AM

## 2014-03-10 NOTE — Progress Notes (Signed)
UR completed 

## 2014-03-10 NOTE — Progress Notes (Addendum)
Subjective: Joseph Underwood was not talkative this morning, but was able to say that he feels much better. He is not in pain. He is hungry for lunch.  Objective: Vital signs in last 24 hours: Filed Vitals:   03/10/14 0500 03/10/14 0600 03/10/14 0700 03/10/14 0710  BP: 146/98 151/113 139/90   Pulse: 106 110  107  Temp:    99 F (37.2 C)  TempSrc:    Oral  Resp: 17 18  16   Height:      Weight:      SpO2: 98% 100%  98%   Weight change:   Intake/Output Summary (Last 24 hours) at 03/10/14 0732 Last data filed at 03/10/14 0600  Gross per 24 hour  Intake    305 ml  Output    200 ml  Net    105 ml   Physical Exam: General: A&Ox3 but sleepy on exam, cachectic  HEENT: [place]/AT, PERRL, EOMi, right frontal abrasion, subconjunctival hemorrhage with injection OD and discharge OU, blepharedema OD, no pain on palpation, tongue laceration (right-side) Heart: tachycardic to 109, regular rhythm, no murmurs Lungs: CTAB, unlabored, no wheezes Abdomen: BS+, soft, nontender, nondistended Extremities: No edema or cyanosis Skin: no rashes or lesions Neuro: difficult to assess orientation, patient sleepy (preferred mother to answer questions), grossly intact, slightly tremulous  Lab Results: Basic Metabolic Panel:  Recent Labs Lab 03/09/14 2035 03/10/14 0304  NA 131* 136  K 3.0* 2.9*  CL 86* 91*  CO2 27 30  GLUCOSE 139* 116*  BUN 8 <5*  CREATININE 1.14 0.92  CALCIUM 8.3* 8.2*  MG  --  1.1*  PHOS  --  4.5   Liver Function Tests:  Recent Labs Lab 03/09/14 2035 03/10/14 0304  AST 154* 124*  ALT 59* 53  ALKPHOS 173* 154*  BILITOT 1.3* 1.7*  PROT 7.6 7.5  ALBUMIN 4.0 3.7   CBC:  Recent Labs Lab 03/09/14 2035 03/10/14 0304  WBC 8.6 9.6  NEUTROABS 7.2  --   HGB 12.5* 12.4*  HCT 35.4* 35.3*  MCV 96.7 95.9  PLT 153 136*   Cardiac Enzymes:  Recent Labs Lab 03/10/14 0304  CKTOTAL 304*  TROPONINI 0.07*   Thyroid Function Tests:  Recent Labs Lab 03/10/14 0304  TSH  0.664   Urine Drug Screen: Drugs of Abuse     Component Value Date/Time   LABOPIA NONE DETECTED 03/10/2014 0226   COCAINSCRNUR NONE DETECTED 03/10/2014 0226   LABBENZ NONE DETECTED 03/10/2014 0226   AMPHETMU NONE DETECTED 03/10/2014 0226   THCU NONE DETECTED 03/10/2014 0226   LABBARB NONE DETECTED 03/10/2014 0226    Urinalysis:  Recent Labs Lab 03/10/14 0226  COLORURINE YELLOW  LABSPEC 1.008  PHURINE 7.0  GLUCOSEU NEGATIVE  HGBUR TRACE*  BILIRUBINUR NEGATIVE  KETONESUR NEGATIVE  PROTEINUR 30*  UROBILINOGEN 1.0  NITRITE NEGATIVE  LEUKOCYTESUR NEGATIVE   Studies/Results: Ct Head Wo Contrast  03/10/2014   CLINICAL DATA:  Weakness seizure for 15 min. Patient fell with hematoma to the center of forehead. Patient typically drinks 1 pint per day and has not had anything to drink in 3 days.  EXAM: CT HEAD WITHOUT CONTRAST  TECHNIQUE: Contiguous axial images were obtained from the base of the skull through the vertex without intravenous contrast.  COMPARISON:  12/16/2013  FINDINGS: Mild diffuse cerebral atrophy. Mild prominence of ventricles consistent with central atrophy. No mass effect or midline shift. No abnormal extra-axial fluid collections. Gray-white matter junctions are distinct. Basal cisterns are not effaced. No evidence  of acute intracranial hemorrhage. No depressed skull fractures. Mucosal thickening in the right maxillary antrum. Mastoid air cells are not opacified.  IMPRESSION: No acute intracranial abnormalities. Mild diffuse cerebral atrophy. No change since prior study.   Electronically Signed   By: Lucienne Capers M.D.   On: 03/10/2014 00:12   Medications:  I have reviewed the patient's current medications. Prior to Admission:  Prescriptions prior to admission  Medication Sig Dispense Refill Last Dose  . amLODipine (NORVASC) 10 MG tablet Take 1 tablet (10 mg total) by mouth daily. 30 tablet 2 03/09/2014 at Unknown time  . folic acid (FOLVITE) 1 MG tablet Take 1  tablet (1 mg total) by mouth daily. 30 tablet 6 03/09/2014 at Unknown time  . phenytoin (DILANTIN) 100 MG ER capsule Take 300 mg by mouth at bedtime.   03/08/2014 at Unknown time  . ondansetron (ZOFRAN-ODT) 8 MG disintegrating tablet Take 1 tablet (8 mg total) by mouth once. 20 tablet 0    Scheduled Meds: . amLODipine  10 mg Oral Daily  . [START ON 1/51/7616] folic acid  1 mg Oral Daily  . heparin  5,000 Units Subcutaneous 3 times per day  . LORazepam      . LORazepam  0-4 mg Intravenous Q6H   Followed by  . [START ON 03/12/2014] LORazepam  0-4 mg Intravenous Q12H  . [START ON 03/11/2014] multivitamin with minerals  1 tablet Oral Daily  . phenytoin  300 mg Oral QHS  . sodium chloride  3 mL Intravenous Q12H   Continuous Infusions: . sodium chloride     PRN Meds:.acetaminophen **OR** acetaminophen, LORazepam **OR** LORazepam, ondansetron **OR** ondansetron (ZOFRAN) IV Assessment/Plan: Principal Problem:   Alcohol withdrawal seizure Active Problems:   Seizures   Essential hypertension, benign   Tobacco dependence   Protein-calorie malnutrition   Alcohol abuse   Hypokalemia   Withdrawal seizures   Fall  Joseph Underwood is a 49 yo man with a history of alcohol abuse and related seizures who presented with the same. His CIWA peaked at 13 at 11:00 PM 1/24; currently 2.  Seizure 2/2 Alcohol Withdrawal: Joseph Underwood has a history of seizures; it is unclear whether he has true seizure disorder or only alcohol withdrawal seizures, but he has had alcohol withdrawal seizures in the past. Prior to this admission, he had a witnessed seizure and fall in the setting of alcohol withdrawal as well as non-compliance with his phenytoin. He had evidence of seizure in his tongue laceration, forehead abrasion and subconjunctival hemorrhage OD, but his head CT was unchanged from prior with no acute intracranial abnormalities but mild diffuse cerebral atrophy. He also admits he does not take his phenytoin as prescribed  and his level today was low (6.6). He was loaded with phenytoin in the ED. The fall was unlikely cardiac in origin as he denies any symptoms and his troponins were 0.06 and 0.07. EKG this morning was unremarkable. TSH WNL (0.664). Total CK 304 (H). Lactic acid 1.6 (WNL). - Phenytoin per pharmacy - Repeat phenytoin level pending - CIWA protocol - Cardiac monitoring - Likely move from SDU tomorrow if stable overnight - Patient will be advised that he cannot drive with this seizure history  Alcohol Withdrawal: Patient's last drink was on 1/23 and he has had seizures in the setting of withdrawal in the past. His elevated transaminases of AST ALT 154 and 59, respectively, suggest alcohol abuse. UDS negative. - Multivitamin - Folic acid 1 mg po daily - Thiamine 100 mg  po or iv daily  Anion Gap Metabolic Acidosis: Joseph Underwood had a presenting anion gap of 18-->16. His bicarb was normal at 27 so he may have compensated respiration. Calculated serum osmolality of 279 is low normal. He may have a ketoacidosis related to his chronic alcohol use as his gap has always been elevated in our EMR. Lactic acid WNL. Negative serum and urine ketones. - Trend gap as patient is rehydrated  Bacterial Conjunctivitis OU: Patient has a subconjunctival hemorrhage OD, but also appears to have a developing bacterial conjunctivitis OU with discharge. - Erythromycin ointment at night both eyes  Resolved Mild Hyponatremia: Joseph Lazo's sodium resolved to 136, though he had a presenting sodium of 131, corrected to 132 (considering elevated glucose). His calculated serum osmolality of 279 is low normal. This was likely in the setting of his alcohol intoxication (beer potomania). His renal function is only mildly worse than baseline as his presenting creatinine was 1.14 with a baseline ~ 0.9. Cr corrected to 0.76. - NS @ 100 cc/hr for 10 hours   Resolving Hypokalemia: Joseph Underwood had a presenting K of 3.0. This corrected to 3.7. The  cause is unclear. He received KCL 10 mEq iv once in the [facility] prior to transfer. The cause is unclear. Mg 1.5. - KDur 40 mEq twice and Mg sulfate 2 g in 50 mL once  - Continue to trend Mg and K to goal 2 and 4 with repeat CMET tomorrow am  AKI: Presenting creatinine of 1.14-->0.76. Back to baseline. He was likely dehydrated in the setting of alcohol abuse. - NS @ 100 cc/hr for 10 hours  Elevated Transaminases: Joseph Hansson's elevated transaminases of AST ALT 154 and 59, respectively, suggest alcohol abuse. They were last measured 12/2013 with a similar pattern of AST ALT 99 and 51, respectively. His last hepatitis panel in 04/2010 was hep A IgM negative, hep B surface antigen negative, hep b core IgM negative and HCV antibody negative. He currently denies any abdominal symptoms. He had a CT abd pelvis 08/2013 notable for fatty infiltrates of the liver and gallbladder adequately distended with no evidence of stones or acute inflammatory changes. Hepatitis panel today had the same results.   Elevated Alkaline Phosphatase: Joseph Brucato presenting alkaline phosphatase of 173 is up from 120 in 12/2013. The trend appears that his alkaline phosphatase has increased over the past two years from a previous baseline of ~ 70. He had a CT abd pelvis 08/2013 notable for fatty infiltrates of the liver and gallbladder adequately distended with no evidence of stones or acute inflammatory changes. Trending down 173-->132.  HTN: Joseph Underwood was initially hypertensive, but it is currently 138/100. This was in the setting of alcohol withdrawal and likely non-compliance with his medications. At home he takes amlodipine 10 mg daily. - Continue amlodipine 10 mg daily  DVT PPx: Heparin 5000 U [place] TID  Diet: Regular  Dispo: Disposition is deferred at this time, awaiting improvement of current medical problems.  Anticipated discharge in approximately 1-2 day(s).   The patient does have a current PCP Drucilla Schmidt, MD) and does not  need an [facility] hospital follow-up appointment after discharge.  The patient does not have transportation limitations that hinder transportation to clinic appointments.  .Services Needed at time of discharge: Y = Yes, Blank = No PT:   OT:   RN:   Equipment:   Other:     LOS: 1 day   Venita Lick, MD

## 2014-03-10 NOTE — Progress Notes (Signed)
Patient placed on contact for CDIFF r/t loose stools. Order placed and contact cart outside of room. No family at bedside.

## 2014-03-10 NOTE — Progress Notes (Signed)
CSW (Clinical Education officer, museum) received consult. CSW unable to assist. MD will need to make decision on pt ability to drive. CSW can offer pt transportation resources if needed.  [place], [place]

## 2014-03-11 DIAGNOSIS — G9340 Encephalopathy, unspecified: Secondary | ICD-10-CM

## 2014-03-11 DIAGNOSIS — R569 Unspecified convulsions: Secondary | ICD-10-CM

## 2014-03-11 LAB — CBC
HEMATOCRIT: 27.8 % — AB (ref 39.0–52.0)
Hemoglobin: 9.8 g/dL — ABNORMAL LOW (ref 13.0–17.0)
MCH: 33.9 pg (ref 26.0–34.0)
MCHC: 35.3 g/dL (ref 30.0–36.0)
MCV: 96.2 fL (ref 78.0–100.0)
PLATELETS: 93 10*3/uL — AB (ref 150–400)
RBC: 2.89 MIL/uL — AB (ref 4.22–5.81)
RDW: 14.2 % (ref 11.5–15.5)
WBC: 7 10*3/uL (ref 4.0–10.5)

## 2014-03-11 LAB — BASIC METABOLIC PANEL
Anion gap: 9 (ref 5–15)
CHLORIDE: 101 mmol/L (ref 96–112)
CO2: 24 mmol/L (ref 19–32)
CREATININE: 0.64 mg/dL (ref 0.50–1.35)
Calcium: 8 mg/dL — ABNORMAL LOW (ref 8.4–10.5)
GFR calc Af Amer: >90 mL/min (ref >90)
GLUCOSE: 86 mg/dL (ref 70–99)
POTASSIUM: 3.4 mmol/L — AB (ref 3.5–5.1)
SODIUM: 134 mmol/L — AB (ref 135–145)

## 2014-03-11 LAB — COMPREHENSIVE METABOLIC PANEL
ALBUMIN: 3.1 g/dL — AB (ref 3.5–5.2)
ALT: 45 U/L (ref 0–53)
ANION GAP: 8 (ref 5–15)
AST: 155 U/L — ABNORMAL HIGH (ref 0–37)
Alkaline Phosphatase: 121 U/L — ABNORMAL HIGH (ref 39–117)
BILIRUBIN TOTAL: 1.3 mg/dL — AB (ref 0.3–1.2)
BUN: <5 mg/dL — ABNORMAL LOW (ref 6–23)
CALCIUM: 7.6 mg/dL — AB (ref 8.4–10.5)
CO2: 27 mmol/L (ref 19–32)
Chloride: 99 mmol/L (ref 96–112)
Creatinine, Ser: 0.71 mg/dL (ref 0.50–1.35)
GFR calc Af Amer: >90 mL/min (ref >90)
GFR calc non Af Amer: >90 mL/min (ref >90)
GLUCOSE: 93 mg/dL (ref 70–99)
Potassium: 3.7 mmol/L (ref 3.5–5.1)
Sodium: 134 mmol/L — ABNORMAL LOW (ref 135–145)
Total Protein: 5.9 g/dL — ABNORMAL LOW (ref 6.0–8.3)

## 2014-03-11 LAB — CLOSTRIDIUM DIFFICILE BY PCR: CDIFFPCR: POSITIVE — AB

## 2014-03-11 LAB — PHENYTOIN LEVEL, TOTAL: Phenytoin Lvl: 9.7 ug/mL — ABNORMAL LOW (ref 10.0–20.0)

## 2014-03-11 LAB — MAGNESIUM: Magnesium: 1.2 mg/dL — ABNORMAL LOW (ref 1.5–2.5)

## 2014-03-11 MED ORDER — NICOTINE 21 MG/24HR TD PT24
21.0000 mg | MEDICATED_PATCH | Freq: Every day | TRANSDERMAL | Status: DC
  Administered 2014-03-11 – 2014-03-16 (×6): 21 mg via TRANSDERMAL
  Filled 2014-03-11 (×6): qty 1

## 2014-03-11 MED ORDER — MAGNESIUM SULFATE 2 GM/50ML IV SOLN
2.0000 g | Freq: Once | INTRAVENOUS | Status: AC
  Administered 2014-03-11: 2 g via INTRAVENOUS
  Filled 2014-03-11: qty 50

## 2014-03-11 MED ORDER — METRONIDAZOLE IN NACL 5-0.79 MG/ML-% IV SOLN
500.0000 mg | Freq: Three times a day (TID) | INTRAVENOUS | Status: DC
  Administered 2014-03-11 – 2014-03-14 (×10): 500 mg via INTRAVENOUS
  Filled 2014-03-11 (×13): qty 100

## 2014-03-11 MED ORDER — LORAZEPAM 2 MG/ML IJ SOLN
1.0000 mg | INTRAMUSCULAR | Status: DC | PRN
  Administered 2014-03-11: 4 mg via INTRAVENOUS
  Administered 2014-03-12: 2 mg via INTRAVENOUS
  Filled 2014-03-11: qty 1

## 2014-03-11 MED ORDER — LORAZEPAM 2 MG/ML IJ SOLN
2.0000 mg | Freq: Once | INTRAMUSCULAR | Status: AC
  Administered 2014-03-11: 2 mg via INTRAVENOUS

## 2014-03-11 MED ORDER — POTASSIUM CHLORIDE CRYS ER 20 MEQ PO TBCR
40.0000 meq | EXTENDED_RELEASE_TABLET | Freq: Once | ORAL | Status: AC
  Administered 2014-03-11: 40 meq via ORAL
  Filled 2014-03-11: qty 2

## 2014-03-11 MED ORDER — PHENYTOIN SODIUM EXTENDED 100 MG PO CAPS
300.0000 mg | ORAL_CAPSULE | Freq: Every day | ORAL | Status: DC
  Administered 2014-03-11 – 2014-03-15 (×5): 300 mg via ORAL
  Filled 2014-03-11 (×6): qty 3

## 2014-03-11 MED ORDER — LORAZEPAM 2 MG/ML IJ SOLN
1.0000 mg | Freq: Once | INTRAMUSCULAR | Status: AC
  Administered 2014-03-11: 1 mg via INTRAVENOUS
  Filled 2014-03-11: qty 1

## 2014-03-11 MED ORDER — HALOPERIDOL LACTATE 5 MG/ML IJ SOLN
5.0000 mg | INTRAMUSCULAR | Status: AC
  Administered 2014-03-11: 5 mg via INTRAVENOUS
  Filled 2014-03-11: qty 1

## 2014-03-11 MED ORDER — LORAZEPAM 2 MG/ML IJ SOLN
INTRAMUSCULAR | Status: AC
Start: 2014-03-11 — End: 2014-03-11
  Filled 2014-03-11: qty 2

## 2014-03-11 MED ORDER — PROMETHAZINE HCL 25 MG PO TABS: 12.5000 mg | ORAL_TABLET | Freq: Four times a day (QID) | ORAL | Status: DC | PRN

## 2014-03-11 MED ORDER — PROMETHAZINE HCL 12.5 MG RE SUPP
12.5000 mg | Freq: Four times a day (QID) | RECTAL | Status: DC | PRN
Start: 2014-03-11 — End: 2014-03-16

## 2014-03-11 MED ORDER — LORAZEPAM 2 MG/ML IJ SOLN
INTRAMUSCULAR | Status: AC
  Filled 2014-03-11: qty 1

## 2014-03-11 MED ORDER — PROMETHAZINE HCL 25 MG/ML IJ SOLN: 12.5000 mg | Freq: Four times a day (QID) | INTRAMUSCULAR | Status: DC | PRN

## 2014-03-11 MED ORDER — SODIUM CHLORIDE 0.9 % IV SOLN
INTRAVENOUS | Status: DC
  Administered 2014-03-12: via INTRAVENOUS

## 2014-03-11 MED ORDER — LORAZEPAM 2 MG/ML IJ SOLN
2.5000 mg | Freq: Once | INTRAMUSCULAR | Status: AC
  Administered 2014-03-11: 2.5 mg via INTRAVENOUS

## 2014-03-11 MED ORDER — DEXMEDETOMIDINE HCL IN NACL 200 MCG/50ML IV SOLN
0.4000 ug/kg/h | INTRAVENOUS | Status: DC
  Administered 2014-03-11 – 2014-03-12 (×2): 0.4 ug/kg/h via INTRAVENOUS
  Filled 2014-03-11 (×2): qty 50

## 2014-03-11 MED ORDER — LORAZEPAM 2 MG/ML IJ SOLN
2.5000 mg | Freq: Once | INTRAMUSCULAR | Status: AC
  Administered 2014-03-11: 2.5 mg via INTRAVENOUS
  Filled 2014-03-11: qty 2

## 2014-03-11 NOTE — Progress Notes (Signed)
Subjective: Joseph Underwood had one word answers to questions today, but was not able to make conversation or to stay awake for more than a few answers.  Patient was agitated, combative and inappropriate to staff overnight; required 7 mg ativan and IVC placement.  Objective: Vital signs in last 24 hours: Filed Vitals:   03/10/14 2300 03/11/14 0000 03/11/14 0800 03/11/14 1118  BP: 143/90 135/95 145/97 154/104  Pulse:   92 102  Temp:  99.3 F (37.4 C) 99 F (37.2 C)   TempSrc:  Oral Oral   Resp: 18 16 16 13   Height:      Weight:      SpO2:  100% 99%    Weight change:   Intake/Output Summary (Last 24 hours) at 03/11/14 1330 Last data filed at 03/11/14 1202  Gross per 24 hour  Intake    980 ml  Output    900 ml  Net     80 ml   Physical Exam: General: somnolent on exam, cachectic, mother at bedside (very concerned) HEENT: [place]/AT, PERRL, EOMi, right frontal abrasion, subconjunctival hemorrhage with injection OD decreased discharge today, blepharedema OD, no pain on palpation, tongue laceration (right-side) Heart: tachycardic to 102, regular rhythm, no murmurs Lungs: CTAB, unlabored, no wheezes Abdomen: BS+, soft, nontender, nondistended Extremities: No edema or cyanosis Skin: no rashes or lesions Neuro: A&Ox3, somnolent, grossly intact, slightly tremulous  Lab Results: Basic Metabolic Panel:  Recent Labs Lab 03/10/14 0304 03/10/14 0805 03/10/14 2346 03/11/14 0817  NA 136 136 134* 134*  K 2.9* 3.7 3.7 3.4*  CL 91* 94* 99 101  CO2 30 26 27 24   GLUCOSE 116* 90 93 86  BUN <5* <5* <5* <5*  CREATININE 0.92 0.76 0.71 0.64  CALCIUM 8.2* 7.7* 7.6* 8.0*  MG 1.1* 1.5  --  1.2*  PHOS 4.5  --   --   --    Liver Function Tests:  Recent Labs Lab 03/10/14 0805 03/10/14 2346  AST 98* 155*  ALT 44 45  ALKPHOS 132* 121*  BILITOT 1.4* 1.3*  PROT 6.3 5.9*  ALBUMIN 3.1* 3.1*   CBC:  Recent Labs Lab 03/09/14 2035 03/10/14 0304 03/10/14 2346  WBC 8.6 9.6 7.0    NEUTROABS 7.2  --   --   HGB 12.5* 12.4* 9.8*  HCT 35.4* 35.3* 27.8*  MCV 96.7 95.9 96.2  PLT 153 136* 93*   Cardiac Enzymes:  Recent Labs Lab 03/10/14 0304 03/10/14 1047 03/10/14 1540  CKTOTAL 304*  --   --   TROPONINI 0.07* 0.06* 0.06*   Thyroid Function Tests:  Recent Labs Lab 03/10/14 0304  TSH 0.664   Urine Drug Screen: Drugs of Abuse     Component Value Date/Time   LABOPIA NONE DETECTED 03/10/2014 0226   COCAINSCRNUR NONE DETECTED 03/10/2014 0226   LABBENZ NONE DETECTED 03/10/2014 0226   AMPHETMU NONE DETECTED 03/10/2014 0226   THCU NONE DETECTED 03/10/2014 0226   LABBARB NONE DETECTED 03/10/2014 0226    Urinalysis:  Recent Labs Lab 03/10/14 0226  COLORURINE YELLOW  LABSPEC 1.008  PHURINE 7.0  GLUCOSEU NEGATIVE  HGBUR TRACE*  BILIRUBINUR NEGATIVE  KETONESUR NEGATIVE  PROTEINUR 30*  UROBILINOGEN 1.0  NITRITE NEGATIVE  LEUKOCYTESUR NEGATIVE   Studies/Results: Ct Head Wo Contrast  03/10/2014   CLINICAL DATA:  Weakness seizure for 15 min. Patient fell with hematoma to the center of forehead. Patient typically drinks 1 pint per day and has not had anything to drink in 3 days.  EXAM:  CT HEAD WITHOUT CONTRAST  TECHNIQUE: Contiguous axial images were obtained from the base of the skull through the vertex without intravenous contrast.  COMPARISON:  12/16/2013  FINDINGS: Mild diffuse cerebral atrophy. Mild prominence of ventricles consistent with central atrophy. No mass effect or midline shift. No abnormal extra-axial fluid collections. Gray-white matter junctions are distinct. Basal cisterns are not effaced. No evidence of acute intracranial hemorrhage. No depressed skull fractures. Mucosal thickening in the right maxillary antrum. Mastoid air cells are not opacified.  IMPRESSION: No acute intracranial abnormalities. Mild diffuse cerebral atrophy. No change since prior study.   Electronically Signed   By: Lucienne Capers M.D.   On: 03/10/2014 00:12    Medications:  I have reviewed the patient's current medications. Prior to Admission:  Prescriptions prior to admission  Medication Sig Dispense Refill Last Dose  . amLODipine (NORVASC) 10 MG tablet Take 1 tablet (10 mg total) by mouth daily. 30 tablet 2 03/09/2014 at Unknown time  . folic acid (FOLVITE) 1 MG tablet Take 1 tablet (1 mg total) by mouth daily. 30 tablet 6 03/09/2014 at Unknown time  . phenytoin (DILANTIN) 100 MG ER capsule Take 300 mg by mouth at bedtime.   03/08/2014 at Unknown time  . ondansetron (ZOFRAN-ODT) 8 MG disintegrating tablet Take 1 tablet (8 mg total) by mouth once. 20 tablet 0    Scheduled Meds: . amLODipine  10 mg Oral Daily  . erythromycin   Both Eyes QHS  . folic acid  1 mg Oral Daily  . heparin  5,000 Units Subcutaneous 3 times per day  . LORazepam  0-4 mg Intravenous Q6H   Followed by  . [START ON 03/12/2014] LORazepam  0-4 mg Intravenous Q12H  . LORazepam  1 mg Oral Once  . metronidazole  500 mg Intravenous Q8H  . multivitamin with minerals  1 tablet Oral Daily  . phenytoin  300 mg Oral QHS  . sodium chloride  3 mL Intravenous Q12H   Continuous Infusions:   PRN Meds:.acetaminophen **OR** acetaminophen, LORazepam **OR** LORazepam, ondansetron **OR** ondansetron (ZOFRAN) IV Assessment/Plan: Principal Problem:   Alcohol withdrawal seizure Active Problems:   Seizures   Essential hypertension, benign   Tobacco dependence   Protein-calorie malnutrition   Alcohol abuse   Hypokalemia   Withdrawal seizures   Seizure   Fall  Joseph Underwood is a 49 yo man with a history of alcohol abuse and withdrawal seizures who presented with the same. His CIWA peaked at 17 overnight (~3:00-5:00AM); currently 16.  Alcohol Withdrawal: Patient's last drink was on 1/23. He then "got stuck in the snow" and could not get alcohol (per his mother). He has had seizures in the setting of withdrawal in the past. His elevated transaminases of AST ALT 154 and 59, respectively,  suggest alcohol abuse. UDS negative. May need Precedex and PCCM if symptoms worsen. - Multivitamin - Folic acid 1 mg po daily - Thiamine 100 mg po or iv daily - Switched zofran-->phenergan (IV/PO/PR) in context of slightly long QT (469) - CIWA protocol with ativan 0-4 mg q6 hours IV along with 1 mg q6 hours PRN for CIWA >8 - Will reconsider restarting IVF tomorrow (patient has not been eating, but RN concerned he cannot maintain IV right now) - Currently being managed in SDU; if his withdrawal worsens or he starts to go into DTs, may benefit from Precedex  - Consult to social work  Seizure 2/2 Alcohol Withdrawal: [name] has a history of seizures; it is unclear whether  he has true seizure disorder or only alcohol withdrawal seizures, but he has had alcohol withdrawal seizures in the past. Prior to this admission, he had a witnessed seizure and fall in the setting of alcohol withdrawal as well as non-compliance with his phenytoin. He had evidence of seizure in his tongue laceration, forehead abrasion and subconjunctival hemorrhage OD, but his head CT was unchanged from prior with no acute intracranial abnormalities but mild diffuse cerebral atrophy. He also admits he does not take his phenytoin as prescribed and his level today was low (6.6). He was loaded with phenytoin in the ED and is continuing to receive it with monitored levels by Pharmacy. His fall was unlikely cardiac in origin as he denies any symptoms and his troponins were 0.06 and 0.07. Repeat EKG was unremarkable. TSH WNL (0.664). Total CK 304 (H). Lactic acid 1.6 (WNL). - Phenytoin per pharmacy - Repeat phenytoin level 9.7, corrected to be 13.47, so resuming phenytoin 300 mg PO QHS with repeat level in 5-7 days - CIWA protocol - Cardiac monitoring - Patient will be advised that he cannot drive with this seizure history  Clostridium Difficile: Unclear how he developed this in the community. + today. - Contact isolation - IV  metronidazole 500 mg at 100 mL/hr q8 hours  Resolved Anion Gap Metabolic Acidosis: [name] had a presenting anion gap of 18-->16-->9. His initial bicarb was normal at 27 so he may have had compensated respiration. Calculated serum osmolality of 279 is low normal. He may have a ketoacidosis related to his chronic alcohol use as his gap has always been elevated in our EMR. Lactic acid WNL. Negative serum and urine ketones.  Possible Bacterial Conjunctivitis OU: Patient has a subconjunctival hemorrhage OD, but also appears to have a developing bacterial conjunctivitis OU with discharge. - Erythromycin ointment at night both eyes  Mild Hyponatremia: Mr Hochstetler's sodium resolved to 136, now at 134 (with a presenting sodium of 131). His calculated serum osmolality of 279 is low normal. This was likely in the setting of his alcohol intoxication (beer potomania). His renal function is only mildly worse than baseline as his presenting creatinine was 1.14 with a baseline ~ 0.9. Cr corrected to 0.76.  - Considered resuming IVF (patient also has not eaten today), but RN concerned he cannot keep IV in at the moment  Resolving Hypokalemia: [name] had a presenting K of 3.0-->3.7-->3.4. Mg 1.1-->1.5-->1.2. The cause is unclear. He has received KCL 10 mEq iv, kdur 40 meq twice and mg sulfate 2 g in 50 mL once. The cause is unclear.  - Continue to trend Mg and K to goal 2 and 4 with repeat CMET tomorrow am - Gave magnesium sulfate 2 g in 50 mL IV and kdur 40 meq once this morning  Elevated Transaminases: Mr Im's elevated transaminases of AST ALT 154 and 59, respectively, suggest alcohol abuse. They were last measured 12/2013 with a similar pattern of AST ALT 99 and 51, respectively. His last hepatitis panel in 04/2010 was hep A IgM negative, hep B surface antigen negative, hep b core IgM negative and HCV antibody negative. He currently denies any abdominal symptoms. He had a CT abd pelvis 08/2013 notable for fatty  infiltrates of the liver and gallbladder adequately distended with no evidence of stones or acute inflammatory changes. Hepatitis panel on this admission had the same results.   Elevated Alkaline Phosphatase: Mr Bollard presenting alkaline phosphatase of 173 is up from 120 in 12/2013. The trend appears that  his alkaline phosphatase has increased over the past two years from a previous baseline of ~ 70. He had a CT abd pelvis 08/2013 notable for fatty infiltrates of the liver and gallbladder adequately distended with no evidence of stones or acute inflammatory changes. Trending down 173-->132-->121.  Hypertension: Joseph Underwood was initially hypertensive, but it is currently 154/104. This was in the setting of alcohol withdrawal and likely non-compliance with his medications. At home he takes amlodipine 10 mg daily. - Continue amlodipine 10 mg daily  DVT PPx: Heparin 5000 U Cameron TID  Diet: Regular (has not been eating today)  Dispo: Disposition is deferred at this time, awaiting improvement of current medical problems.  Anticipated discharge in approximately 1-2 day(s).   The patient does have a current PCP Drucilla Schmidt, MD) and does not need an [facility] hospital follow-up appointment after discharge.  The patient does not have transportation limitations that hinder transportation to clinic appointments.  .Services Needed at time of discharge: Y = Yes, Blank = No PT:   OT:   RN:   Equipment:   Other:     LOS: 2 days   Venita Lick, MD
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
## 2014-03-11 NOTE — Progress Notes (Signed)
Verbal order to give pt 2.5 of IV ativan. See [facility]

## 2014-03-11 NOTE — Consult Note (Signed)
PULMONARY / CRITICAL CARE MEDICINE   Name: Joseph Underwood MRN: [id number] DOB: [birth year]    ADMISSION DATE:  03/09/2014 CONSULTATION DATE:  1/26  REFERRING MD :  Graciella Freer  CHIEF COMPLAINT:  Agitation, concern for alcohol withdrawal  INITIAL PRESENTATION:  49 y.o PMH etoh abuse, withdrawal seizures, HTN who presented to [facility] hospital 1/25 due to witnessed seizure on 1/25 hitting his head on the tube during the seizure and acute encephalopathy.  CCM consulted 1/26 for agitation and concern for alcohol withdrawal. Still agitated despite Ativan.       STUDIES:  1/25 CT head negative   SIGNIFICANT EVENTS: 1/25 admitted>> 1/26 agitated>>CCM consulted for agitation, alcohol withdrawal    HISTORY OF PRESENT ILLNESS:   49 y.o PMH etoh abuse, withdrawal seizures, HTN who presented to [facility] hospital 1/25 due to witnessed seizure on 1/25 hitting his head on the tube during the seizure and acute encephalopathy.  CCM consulted 1/26 for agitation and concern for alcohol withdrawal. Still agitated despite Ativan.      PAST MEDICAL HISTORY :   has a past medical history of Alcohol abuse; Hiccups; HTN (hypertension); and Alcohol related seizure.  has past surgical history that includes No past surgeries. Prior to Admission medications   Medication Sig Start Date End Date Taking? Authorizing Provider  amLODipine (NORVASC) 10 MG tablet Take 1 tablet (10 mg total) by mouth daily. 01/31/14  Yes Madilyn Fireman, MD  folic acid (FOLVITE) 1 MG tablet Take 1 tablet (1 mg total) by mouth daily. 01/31/14  Yes Madilyn Fireman, MD  phenytoin (DILANTIN) 100 MG ER capsule Take 300 mg by mouth at bedtime.   Yes Historical Provider, MD  ondansetron (ZOFRAN-ODT) 8 MG disintegrating tablet Take 1 tablet (8 mg total) by mouth once. 10/28/13   Carmin Muskrat, MD   No Known Allergies  FAMILY HISTORY:  has no family status information on file.  SOCIAL HISTORY:  reports that he has been smoking Cigarettes.  He has a 18.5  pack-year smoking history. He has never used smokeless tobacco. He reports that he drinks about 52.8 oz of alcohol per week. He reports that he does not use illicit drugs.  REVIEW OF SYSTEMS:  General: denies fever/chills HEENT: +right eye redness  Cardiac: denies cp Pulm: denies sob Abd: denies n/v/ab pain Ext: denies leg edema  Neuro: h/o fall during seizures hitting head on tub prior to admission    SUBJECTIVE:  He wants to go home but denies complaints   VITAL SIGNS: Temp:  [98 F (36.7 C)-99.3 F (37.4 C)] 98.3 F (36.8 C) (01/26 1900) Pulse Rate:  [26-109] 109 (01/26 2000) Resp:  [13-22] 19 (01/26 2100) BP: (118-162)/(53-119) 132/53 mmHg (01/26 2100) SpO2:  [90 %-100 %] 100 % (01/26 2000) HEMODYNAMICS:   VENTILATOR SETTINGS:   INTAKE / OUTPUT:  Intake/Output Summary (Last 24 hours) at 03/11/14 2207 Last data filed at 03/11/14 1839  Gross per 24 hour  Intake    620 ml  Output   1200 ml  Net   -580 ml    PHYSICAL EXAMINATION: General:  Lying in bed, nad Neuro:  Asterixis/tremor on exam, oriented to person, not place HEENT:  [place]/at, right eye with subconjunctival hemorrhage and scleral icterus  Cardiovascular:  Sinus tachycardia  Lungs:  ctab  Abdomen:  Soft, ntnd  Musculoskeletal:  Intact  Skin:  Intact   LABS:  CBC  Recent Labs Lab 03/09/14 2035 03/10/14 0304 03/10/14 2346  WBC 8.6 9.6 7.0  HGB 12.5* 12.4* 9.8*  HCT 35.4* 35.3* 27.8*  PLT 153 136* 93*   Coag's No results for input(s): APTT, INR in the last 168 hours. BMET  Recent Labs Lab 03/10/14 0805 03/10/14 2346 03/11/14 0817  NA 136 134* 134*  K 3.7 3.7 3.4*  CL 94* 99 101  CO2 26 27 24   BUN <5* <5* <5*  CREATININE 0.76 0.71 0.64  GLUCOSE 90 93 86   Electrolytes  Recent Labs Lab 03/10/14 0304 03/10/14 0805 03/10/14 2346 03/11/14 0817  CALCIUM 8.2* 7.7* 7.6* 8.0*  MG 1.1* 1.5  --  1.2*  PHOS 4.5  --   --   --    Sepsis Markers  Recent Labs Lab 03/10/14 0328   LATICACIDVEN 1.6   ABG No results for input(s): PHART, PCO2ART, PO2ART in the last 168 hours. Liver Enzymes  Recent Labs Lab 03/10/14 0304 03/10/14 0805 03/10/14 2346  AST 124* 98* 155*  ALT 53 44 45  ALKPHOS 154* 132* 121*  BILITOT 1.7* 1.4* 1.3*  ALBUMIN 3.7 3.1* 3.1*   Cardiac Enzymes  Recent Labs Lab 03/10/14 0304 03/10/14 1047 03/10/14 1540  TROPONINI 0.07* 0.06* 0.06*   Glucose No results for input(s): GLUCAP in the last 168 hours.  Imaging Ct Head Wo Contrast  03/10/2014   CLINICAL DATA:  Weakness seizure for 15 min. Patient fell with hematoma to the center of forehead. Patient typically drinks 1 pint per day and has not had anything to drink in 3 days.  EXAM: CT HEAD WITHOUT CONTRAST  TECHNIQUE: Contiguous axial images were obtained from the base of the skull through the vertex without intravenous contrast.  COMPARISON:  12/16/2013  FINDINGS: Mild diffuse cerebral atrophy. Mild prominence of ventricles consistent with central atrophy. No mass effect or midline shift. No abnormal extra-axial fluid collections. Gray-white matter junctions are distinct. Basal cisterns are not effaced. No evidence of acute intracranial hemorrhage. No depressed skull fractures. Mucosal thickening in the right maxillary antrum. Mastoid air cells are not opacified.  IMPRESSION: No acute intracranial abnormalities. Mild diffuse cerebral atrophy. No change since prior study.   Electronically Signed   By: Lucienne Capers M.D.   On: 03/10/2014 00:12     ASSESSMENT / PLAN: NEUROLOGIC A:   Alcohol abuse concern for alcohol withdrawal  Agitation  H/o seizures  Acute encephalopathy, resolved   P:   RASS goal: 0 Dilantin 300 po per pharm Precedex gtt will initiate  CIWA, folic acid, mvt  Prn Tylenol for pain   PULMONARY OETT none  A: Tobacco abuse P:   Monitor VS Nicotine patch   CARDIOVASCULAR CVL none A:  Sinus tachycardia  HTN Long QT on admission (590), resolved  P:   Monitor VS  Norvasc 10 mg   RENAL A:   hypoK 3.4  Mag 1.2  Hyponatremia 134  P:   Replete electrolytes prn  Trend BMET   GASTROINTESTINAL A:   Nutrition  Nausea  C difficile  Transaminitis likely 2/2 alcohol  P:   Regular diet if too sedated may need NPO Prn Phenerghan  Flagyl iv  Trend CMET in am   HEMATOLOGIC A:   Normocytic anemia Thrombocytopenia likely 2/2 alcohol abuse  DVT px P:  Trend CBC Heparin sq  INFECTIOUS A:   +C. Diff 1/25 appears 1st episode  P:   BCx2 none UC none Sputum none MRSA neg Abx: Flagyl 500 tid iv 1/26  ENDOCRINE A:   No acute issues  P:   none    FAMILY  - Updates:  will need to be updated as they become available   - Inter-disciplinary family meet or Palliative Care meeting due by:  03/17/14     TODAY'S SUMMARY: 49 y.o admitted for likely alcohol withdrawal seizures with increasing agitation CCM consulted for agitation and concern for alcohol withdrawal despite Ativan.  Will start Precedex gtt doubt will need for long but will monitor and transfer to [facility] ICU.  Continue to treat C.diff with Flagyl.     Pulmonary and [place] Pager: [phone]  03/11/2014, 10:07 PM Karlyn Agee MD PGY 3 IM
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
## 2014-03-11 NOTE — Progress Notes (Signed)
Pt is getting more agitated. MD to bedside. Orders received to give 2mg  IV ativan now.

## 2014-03-11 NOTE — Clinical Documentation Improvement (Signed)
MD's, NP's, and PA's    Per Ht/Wt charting  patient's BMI 17.7  IV of a "Banana bag" given in ED.   If appropriate please document the clinical condition regarding patient's BMI.  Thank you  Possible Clinical conditions  Cachexia   Underweight   Other condition  Cannot clinically determine     Risk Factors: Etoh abuse, Seizures  Treatment:  Nutritional IV   Thank You, Ree Kida ,RN Clinical Documentation Specialist:  [place] [employer]

## 2014-03-11 NOTE — Progress Notes (Signed)
Pt agreeable to wearing monitor now. Monitor placed back on pt. Security has left bedside at this time.

## 2014-03-11 NOTE — Progress Notes (Signed)
Pt has only voided once when stooling. unmeasurable amount of urine noted. MD made aware. Orders received to I&O once pt has calmed down. Next shift made aware.
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
## 2014-03-11 NOTE — Progress Notes (Signed)
Pt refusing labs at this time. Educated pt on why we needed them, pt continues to refuse.

## 2014-03-11 NOTE — Progress Notes (Signed)
Pt now agreeable to getting all labs done at one time. Lab here to stick for previously refused labs and morning labs.
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
## 2014-03-11 NOTE — Progress Notes (Signed)
Pharmacy note: Phenytoin  49 yo male here with alcohol withdrawal seizures and unclear history of seizure disorder. He is on phenytoin 300mg  qhs at home and his admission phenytoin level was 6.6 on 1/24.  He received 1000mg  phenytoin at [facility] and a follow-up phenytoin level on 1/26 at midnight was 9.7 (albumin= 3.1; corrected phenytoin= 13.47).  Plan -Will restart phenytoin 300mg  po qhs -Will consider re-assesisng a phenytoin level in 5-7 days   Hildred Laser, Pharm D 03/11/2014 8:01 AM

## 2014-03-11 NOTE — Progress Notes (Signed)
Pt trying to gag self with eye glasses by sticking frame in the back of his throat. Glasses taken from pt. Pt trying to grab straw. Pt is stumbling, pushing staff off, refusing to let us walk with him to keep him safe. MD called and came to bedside. Orders received for IV ativan. Nurse Tech is now sitting with pt. Will continue to monitor.  Perkins, [place]

## 2014-03-11 NOTE — Progress Notes (Signed)
Pt continues to be physically aggressive and agitated. MD orders received to give additional 2.5mg  IV ativan.

## 2014-03-11 NOTE — Progress Notes (Signed)
Pt refusing to wear the monitor at this time. Also refusing vital signs. MD made aware.  Joseph Underwood, Joseph Underwood

## 2014-03-12 DIAGNOSIS — F1010 Alcohol abuse, uncomplicated: Secondary | ICD-10-CM

## 2014-03-12 DIAGNOSIS — F10230 Alcohol dependence with withdrawal, uncomplicated: Secondary | ICD-10-CM

## 2014-03-12 LAB — CBC
HCT: 28.1 % — ABNORMAL LOW (ref 39.0–52.0)
HEMOGLOBIN: 9.7 g/dL — AB (ref 13.0–17.0)
MCH: 33.3 pg (ref 26.0–34.0)
MCHC: 34.5 g/dL (ref 30.0–36.0)
MCV: 96.6 fL (ref 78.0–100.0)
Platelets: 87 10*3/uL — ABNORMAL LOW (ref 150–400)
RBC: 2.91 MIL/uL — ABNORMAL LOW (ref 4.22–5.81)
RDW: 14.2 % (ref 11.5–15.5)
WBC: 5.7 10*3/uL (ref 4.0–10.5)

## 2014-03-12 LAB — COMPREHENSIVE METABOLIC PANEL
ALT: 41 U/L (ref 0–53)
AST: 105 U/L — ABNORMAL HIGH (ref 0–37)
Albumin: 3 g/dL — ABNORMAL LOW (ref 3.5–5.2)
Alkaline Phosphatase: 112 U/L (ref 39–117)
Anion gap: 6 (ref 5–15)
CO2: 26 mmol/L (ref 19–32)
Calcium: 8.4 mg/dL (ref 8.4–10.5)
Chloride: 101 mmol/L (ref 96–112)
Creatinine, Ser: 0.64 mg/dL (ref 0.50–1.35)
Glucose, Bld: 97 mg/dL (ref 70–99)
Potassium: 3.6 mmol/L (ref 3.5–5.1)
Sodium: 133 mmol/L — ABNORMAL LOW (ref 135–145)
TOTAL PROTEIN: 6 g/dL (ref 6.0–8.3)
Total Bilirubin: 1.1 mg/dL (ref 0.3–1.2)

## 2014-03-12 LAB — PHOSPHORUS: Phosphorus: 3.6 mg/dL (ref 2.3–4.6)

## 2014-03-12 LAB — MAGNESIUM: Magnesium: 1.7 mg/dL (ref 1.5–2.5)

## 2014-03-12 MED ORDER — MIDAZOLAM HCL 2 MG/2ML IJ SOLN
2.0000 mg | INTRAMUSCULAR | Status: DC | PRN
  Administered 2014-03-12 – 2014-03-13 (×2): 2 mg via INTRAVENOUS
  Filled 2014-03-12 (×2): qty 2

## 2014-03-12 MED ORDER — AMLODIPINE BESYLATE 10 MG PO TABS
10.0000 mg | ORAL_TABLET | Freq: Every day | ORAL | Status: DC
  Administered 2014-03-13 – 2014-03-16 (×4): 10 mg via ORAL
  Filled 2014-03-12 (×4): qty 1

## 2014-03-12 MED ORDER — MAGNESIUM SULFATE 4 GM/100ML IV SOLN
4.0000 g | Freq: Once | INTRAVENOUS | Status: AC
  Administered 2014-03-12: 4 g via INTRAVENOUS
  Filled 2014-03-12: qty 100

## 2014-03-12 MED ORDER — FOLIC ACID 1 MG PO TABS
1.0000 mg | ORAL_TABLET | Freq: Every day | ORAL | Status: DC
  Administered 2014-03-13 – 2014-03-16 (×4): 1 mg via ORAL
  Filled 2014-03-12 (×4): qty 1

## 2014-03-12 MED ORDER — POLYVINYL ALCOHOL 1.4 % OP SOLN
1.0000 [drp] | Freq: Three times a day (TID) | OPHTHALMIC | Status: AC
  Administered 2014-03-12 – 2014-03-14 (×11): 1 [drp] via OPHTHALMIC
  Filled 2014-03-12: qty 15

## 2014-03-12 MED ORDER — THIAMINE HCL 100 MG/ML IJ SOLN
Freq: Once | INTRAVENOUS | Status: AC
  Administered 2014-03-12: 11:00:00 via INTRAVENOUS
  Filled 2014-03-12: qty 1000

## 2014-03-12 MED ORDER — THIAMINE HCL 100 MG/ML IJ SOLN
100.0000 mg | INTRAMUSCULAR | Status: DC
  Filled 2014-03-12 (×4): qty 1

## 2014-03-12 MED ORDER — FOLIC ACID 5 MG/ML IJ SOLN
1.0000 mg | Freq: Every day | INTRAMUSCULAR | Status: DC
  Filled 2014-03-12 (×4): qty 0.2

## 2014-03-12 MED ORDER — POTASSIUM CHLORIDE 10 MEQ/100ML IV SOLN
10.0000 meq | INTRAVENOUS | Status: AC
  Administered 2014-03-12 (×4): 10 meq via INTRAVENOUS
  Filled 2014-03-12: qty 100

## 2014-03-12 MED ORDER — ARTIFICIAL TEARS OP OINT
TOPICAL_OINTMENT | Freq: Every day | OPHTHALMIC | Status: DC
  Administered 2014-03-12 – 2014-03-15 (×5): via OPHTHALMIC
  Filled 2014-03-12 (×3): qty 3.5

## 2014-03-12 MED ORDER — VITAMIN B-1 100 MG PO TABS
100.0000 mg | ORAL_TABLET | Freq: Every day | ORAL | Status: DC
  Filled 2014-03-12: qty 1

## 2014-03-12 MED ORDER — THIAMINE HCL 100 MG/ML IJ SOLN: Freq: Once | INTRAVENOUS | Status: DC

## 2014-03-12 MED ORDER — LORAZEPAM 2 MG/ML IJ SOLN
2.0000 mg | INTRAMUSCULAR | Status: DC
  Administered 2014-03-12 – 2014-03-14 (×13): 2 mg via INTRAVENOUS
  Filled 2014-03-12 (×13): qty 1

## 2014-03-12 MED ORDER — VITAMIN B-1 100 MG PO TABS
100.0000 mg | ORAL_TABLET | ORAL | Status: DC
  Administered 2014-03-13 – 2014-03-16 (×4): 100 mg via ORAL
  Filled 2014-03-12 (×4): qty 1

## 2014-03-12 NOTE — Consult Note (Signed)
PULMONARY / CRITICAL CARE MEDICINE   Name: Joseph Underwood MRN: [id number] DOB: 1965-12-14    ADMISSION DATE:  03/09/2014 CONSULTATION DATE:  1/26  REFERRING MD :  Graciella Freer  CHIEF COMPLAINT:  Agitation, concern for alcohol withdrawal  INITIAL PRESENTATION:  49 y.o PMH etoh abuse, withdrawal seizures, HTN who presented to [facility] hospital 1/25 due to witnessed seizure on 1/25 hitting his head on the tube during the seizure and acute encephalopathy.  CCM consulted 1/26 for agitation and concern for alcohol withdrawal. Still agitated despite Ativan.       STUDIES:  1/25 CT head negative   SIGNIFICANT EVENTS: 1/25 admitted>> 1/26 agitated>>CCM consulted for agitation, alcohol withdrawal   SUBJECTIVE:  Not on precedex , wants to hit staff  VITAL SIGNS: Temp:  [97.5 F (36.4 C)-98.3 F (36.8 C)] 98.2 F (36.8 C) (01/27 0745) Pulse Rate:  [26-109] 73 (01/27 0800) Resp:  [13-38] 20 (01/27 0800) BP: (76-162)/(53-119) 76/56 mmHg (01/27 0800) SpO2:  [90 %-100 %] 98 % (01/27 0800) HEMODYNAMICS:   VENTILATOR SETTINGS:   INTAKE / OUTPUT:  Intake/Output Summary (Last 24 hours) at 03/12/14 0858 Last data filed at 03/12/14 0800  Gross per 24 hour  Intake  865.2 ml  Output    950 ml  Net  -84.8 ml    PHYSICAL EXAMINATION: General:  Lying in bed, no distress Neuro:  Asterixis/tremor on exam resolved, oriented to person and place HEENT:  [place]/at, right eye with subconjunctival hemorrhage and scleral icterus  Cardiovascular:  Sinus rhythm s1 s2 RR Lungs:  cta Abdomen:  Soft, ntnd, no r/g  Musculoskeletal:  Intact  Skin:  Intact   LABS:  CBC  Recent Labs Lab 03/10/14 0304 03/10/14 2346 03/12/14 0240  WBC 9.6 7.0 5.7  HGB 12.4* 9.8* 9.7*  HCT 35.3* 27.8* 28.1*  PLT 136* 93* 87*   Coag's No results for input(s): APTT, INR in the last 168 hours. BMET  Recent Labs Lab 03/10/14 2346 03/11/14 0817 03/12/14 0240  NA 134* 134* 133*  K 3.7 3.4* 3.6  CL 99 101 101  CO2 27 24  26   BUN <5* <5* <5*  CREATININE 0.71 0.64 0.64  GLUCOSE 93 86 97   Electrolytes  Recent Labs Lab 03/10/14 0304 03/10/14 0805 03/10/14 2346 03/11/14 0817 03/12/14 0240  CALCIUM 8.2* 7.7* 7.6* 8.0* 8.4  MG 1.1* 1.5  --  1.2* 1.7  PHOS 4.5  --   --   --  3.6   Sepsis Markers  Recent Labs Lab 03/10/14 0328  LATICACIDVEN 1.6   ABG No results for input(s): PHART, PCO2ART, PO2ART in the last 168 hours. Liver Enzymes  Recent Labs Lab 03/10/14 0805 03/10/14 2346 03/12/14 0240  AST 98* 155* 105*  ALT 44 45 41  ALKPHOS 132* 121* 112  BILITOT 1.4* 1.3* 1.1  ALBUMIN 3.1* 3.1* 3.0*   Cardiac Enzymes  Recent Labs Lab 03/10/14 0304 03/10/14 1047 03/10/14 1540  TROPONINI 0.07* 0.06* 0.06*   Glucose No results for input(s): GLUCAP in the last 168 hours.  Imaging No results found.   ASSESSMENT / PLAN: NEUROLOGIC A:   Alcohol abuse concern for alcohol withdrawal  Agitation  H/o seizures  Acute encephalopathy, resolved   P:   RASS goal: 0 Dilantin 300 po per pharm - level order Precedex off and does not appear to be required folic acid, mvt - change to IV as refusing orals  Prn Tylenol for pain- dc with liver dz Schedule ativan q4h 2 mg with hold  parameter Add prn versed Avoid haldol with seziures  PULMONARY OETT none  A: Tobacco abuse P:   Nicotine patch  IS  CARDIOVASCULAR CVL none A:  Sinus tachycardia - resolved HTN Long QT on admission (590), resolved  Trop elevation P:  Monitor VS  Norvasc 10 mg Add clonidine if needed for HTN Dc all trop  RENAL A:   hypoK improved HypoMagnesemia Hyponatremia 134  P:   Replete electrolytes prn  Trend BMET  supp mag, k saline  GASTROINTESTINAL A:   Nutrition  Nausea  C difficile  Transaminitis likely 2/2 alcohol  P:   Regular diet if too sedated may need NPO Now refusing orals, will encourage him to eat Prn Phenerghan  Flagyl iv   HEMATOLOGIC A:   Normocytic  anemia Thrombocytopenia likely 2/2 alcohol abuse, dilution DVT px P:  Trend CBC Heparin sq - dc , follow plat trend Low suspicion HITT 2 scd  INFECTIOUS A:   +C. Diff 1/25 appears 1st episode  P:   BCx2 none UC none Sputum none MRSA neg Abx: Flagyl 500 tid iv 1/26, add stop date 10 days  ENDOCRINE A:   No acute issues  P:   none   FAMILY  - Updates: will need to be updated as they become available   - Inter-disciplinary family meet or Palliative Care meeting due by:  03/17/14    TODAY'S SUMMARY: improved and on NO precedex, schedule meds and add prns to keep off precedex, back to sdu, dil level, needs top eat, back to IMTS in am 1/28 pick up at 7 am   Lavon Paganini. Titus Mould, MD, [place] Pgr: [facility]  Pulmonary and [place] Pager: [phone]

## 2014-03-13 ENCOUNTER — Inpatient Hospital Stay (HOSPITAL_COMMUNITY): Payer: Self-pay

## 2014-03-13 ENCOUNTER — Encounter: Payer: Self-pay | Admitting: Internal Medicine

## 2014-03-13 DIAGNOSIS — A0472 Enterocolitis due to Clostridium difficile, not specified as recurrent: Secondary | ICD-10-CM | POA: Insufficient documentation

## 2014-03-13 LAB — COMPREHENSIVE METABOLIC PANEL
ALT: 43 U/L (ref 0–53)
AST: 102 U/L — ABNORMAL HIGH (ref 0–37)
Albumin: 2.9 g/dL — ABNORMAL LOW (ref 3.5–5.2)
Alkaline Phosphatase: 106 U/L (ref 39–117)
Anion gap: 7 (ref 5–15)
BUN: <5 mg/dL — ABNORMAL LOW (ref 6–23)
CO2: 23 mmol/L (ref 19–32)
Calcium: 8.3 mg/dL — ABNORMAL LOW (ref 8.4–10.5)
Chloride: 107 mmol/L (ref 96–112)
Creatinine, Ser: 0.78 mg/dL (ref 0.50–1.35)
GFR calc Af Amer: >90 mL/min (ref >90)
GLUCOSE: 100 mg/dL — AB (ref 70–99)
POTASSIUM: 4 mmol/L (ref 3.5–5.1)
SODIUM: 137 mmol/L (ref 135–145)
Total Bilirubin: 0.7 mg/dL (ref 0.3–1.2)
Total Protein: 5.5 g/dL — ABNORMAL LOW (ref 6.0–8.3)

## 2014-03-13 LAB — CBC WITH DIFFERENTIAL/PLATELET
Basophils Absolute: 0 10*3/uL (ref 0.0–0.1)
Basophils Relative: 0 % (ref 0–1)
EOS ABS: 0.1 10*3/uL (ref 0.0–0.7)
Eosinophils Relative: 1 % (ref 0–5)
HCT: 26.2 % — ABNORMAL LOW (ref 39.0–52.0)
Hemoglobin: 9.1 g/dL — ABNORMAL LOW (ref 13.0–17.0)
Lymphocytes Relative: 24 % (ref 12–46)
Lymphs Abs: 1.9 10*3/uL (ref 0.7–4.0)
MCH: 34.5 pg — AB (ref 26.0–34.0)
MCHC: 34.7 g/dL (ref 30.0–36.0)
MCV: 99.2 fL (ref 78.0–100.0)
MONOS PCT: 10 % (ref 3–12)
Monocytes Absolute: 0.7 10*3/uL (ref 0.1–1.0)
Neutro Abs: 5 10*3/uL (ref 1.7–7.7)
Neutrophils Relative %: 65 % (ref 43–77)
Platelets: 106 10*3/uL — ABNORMAL LOW (ref 150–400)
RBC: 2.64 MIL/uL — ABNORMAL LOW (ref 4.22–5.81)
RDW: 14.7 % (ref 11.5–15.5)
WBC: 7.7 10*3/uL (ref 4.0–10.5)

## 2014-03-13 LAB — MAGNESIUM: Magnesium: 1.4 mg/dL — ABNORMAL LOW (ref 1.5–2.5)

## 2014-03-13 LAB — PHENYTOIN LEVEL, TOTAL: PHENYTOIN LVL: 8.5 ug/mL — AB (ref 10.0–20.0)

## 2014-03-13 MED ORDER — SODIUM CHLORIDE 0.9 % IV SOLN: INTRAVENOUS | Status: DC | PRN

## 2014-03-13 MED ORDER — MAGNESIUM SULFATE 2 GM/50ML IV SOLN
2.0000 g | Freq: Once | INTRAVENOUS | Status: AC
  Administered 2014-03-13: 2 g via INTRAVENOUS
  Filled 2014-03-13: qty 50
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
## 2014-03-13 NOTE — Progress Notes (Signed)
NURSING PROGRESS NOTE  Joseph Underwood [id number] Transfer Data: 03/13/2014 4:22 PM Attending Provider: Raylene Miyamoto, MD CVU:[id number], Gregary Signs, MD Code Status: FULL   Joseph Underwood is a 49 y.o. male patient transferred from [place]  -No acute distress noted.  -No complaints of shortness of breath.  -No complaints of chest pain.   Cardiac Monitoring: Box # 17 in place. Cardiac monitor yields:sinus tachycardia.  Blood pressure 129/91, pulse 136, temperature 98.1 F (36.7 C), temperature source Oral, resp. rate 16, height 5\' 5"  (1.651 m), weight 48.1 kg (106 lb 0.7 oz), SpO2 97 %.   Allergies:  Review of patient's allergies indicates no known allergies.  Past Medical History:   has a past medical history of Alcohol abuse; Hiccups; HTN (hypertension); and Alcohol related seizure.  Past Surgical History:   has past surgical history that includes No past surgeries.  Social History:   reports that he has been smoking Cigarettes.  He has a 18.5 pack-year smoking history. He has never used smokeless tobacco. He reports that he drinks about 52.8 oz of alcohol per week. He reports that he does not use illicit drugs.  Patient/Family orientated to room. Information packet given to patient/family. Admission inpatient armband information verified with patient/family to include name and date of birth and placed on patient arm. Side rails up x 2, fall assessment and education completed with patient/family. Patient/family able to verbalize understanding of risk associated with falls and verbalized understanding to call for assistance before getting out of bed. Call light within reach. Patient/family able to voice and demonstrate understanding of unit orientation instructions.    Safety sitter at bedside.   Will continue to evaluate and treat per MD orders.

## 2014-03-14 DIAGNOSIS — E876 Hypokalemia: Secondary | ICD-10-CM

## 2014-03-14 DIAGNOSIS — F10288 Alcohol dependence with other alcohol-induced disorder: Secondary | ICD-10-CM

## 2014-03-14 DIAGNOSIS — H1130 Conjunctival hemorrhage, unspecified eye: Secondary | ICD-10-CM

## 2014-03-14 DIAGNOSIS — D509 Iron deficiency anemia, unspecified: Secondary | ICD-10-CM

## 2014-03-14 DIAGNOSIS — A047 Enterocolitis due to Clostridium difficile: Secondary | ICD-10-CM

## 2014-03-14 LAB — CBC WITH DIFFERENTIAL/PLATELET
Basophils Absolute: 0 10*3/uL (ref 0.0–0.1)
Basophils Relative: 0 % (ref 0–1)
Eosinophils Absolute: 0.1 10*3/uL (ref 0.0–0.7)
Eosinophils Relative: 1 % (ref 0–5)
HEMATOCRIT: 31.6 % — AB (ref 39.0–52.0)
HEMOGLOBIN: 11 g/dL — AB (ref 13.0–17.0)
LYMPHS ABS: 2.1 10*3/uL (ref 0.7–4.0)
Lymphocytes Relative: 27 % (ref 12–46)
MCH: 34.2 pg — ABNORMAL HIGH (ref 26.0–34.0)
MCHC: 34.8 g/dL (ref 30.0–36.0)
MCV: 98.1 fL (ref 78.0–100.0)
MONO ABS: 0.8 10*3/uL (ref 0.1–1.0)
MONOS PCT: 11 % (ref 3–12)
Neutro Abs: 4.8 10*3/uL (ref 1.7–7.7)
Neutrophils Relative %: 61 % (ref 43–77)
PLATELETS: 145 10*3/uL — AB (ref 150–400)
RBC: 3.22 MIL/uL — AB (ref 4.22–5.81)
RDW: 14.5 % (ref 11.5–15.5)
WBC: 7.9 10*3/uL (ref 4.0–10.5)

## 2014-03-14 LAB — BASIC METABOLIC PANEL
Anion gap: 10 (ref 5–15)
BUN: <5 mg/dL — ABNORMAL LOW (ref 6–23)
CO2: 28 mmol/L (ref 19–32)
Calcium: 9.3 mg/dL (ref 8.4–10.5)
Chloride: 97 mmol/L (ref 96–112)
Creatinine, Ser: 0.8 mg/dL (ref 0.50–1.35)
GFR calc Af Amer: >90 mL/min (ref >90)
GFR calc non Af Amer: >90 mL/min (ref >90)
Glucose, Bld: 88 mg/dL (ref 70–99)
Potassium: 3.4 mmol/L — ABNORMAL LOW (ref 3.5–5.1)
Sodium: 135 mmol/L (ref 135–145)

## 2014-03-14 LAB — MAGNESIUM: MAGNESIUM: 1.1 mg/dL — AB (ref 1.5–2.5)

## 2014-03-14 MED ORDER — MAGNESIUM SULFATE 4 GM/100ML IV SOLN
4.0000 g | Freq: Once | INTRAVENOUS | Status: AC
  Administered 2014-03-14: 4 g via INTRAVENOUS
  Filled 2014-03-14: qty 100

## 2014-03-14 MED ORDER — LORAZEPAM 2 MG/ML IJ SOLN
2.0000 mg | Freq: Once | INTRAMUSCULAR | Status: AC
  Administered 2014-03-14: 2 mg via INTRAVENOUS
  Filled 2014-03-14: qty 1

## 2014-03-14 MED ORDER — LORAZEPAM 2 MG/ML IJ SOLN: 2.0000 mg | INTRAMUSCULAR | Status: DC | PRN

## 2014-03-14 MED ORDER — METRONIDAZOLE 500 MG PO TABS
500.0000 mg | ORAL_TABLET | Freq: Three times a day (TID) | ORAL | Status: DC
  Administered 2014-03-14 – 2014-03-16 (×7): 500 mg via ORAL
  Filled 2014-03-14 (×9): qty 1

## 2014-03-14 NOTE — Progress Notes (Signed)
PT Cancellation Note  Patient Details Name: Joseph Underwood MRN: [id number] DOB: 03-11-1965   Cancelled Treatment:    Reason Eval/Treat Not Completed: Fatigue/lethargy limiting ability to participate.  Sitter reports that pt was medicated prior to her arrival due to agitation. Pt was unable to be aroused to voice or touch. Will continue to follow and complete PT eval when able.     Rolinda Roan 03/14/2014, 9:28 AM   Rolinda Roan, PT, DPT Acute Rehabilitation Services Pager: [phone]

## 2014-03-14 NOTE — Clinical Social Work Note (Signed)
CSW received consult from MD regarding seeing patient for substance abuse resources. Unfortunately CSW unable to meet with patient today. CSW has left report for weekend CSW.    Liz Beach MSW, [place], [place], [phone]

## 2014-03-14 NOTE — Progress Notes (Signed)
MEDICATION RELATED CONSULT NOTE - FOLLOW UP   Pharmacy Consult for Phenytoin Indication: Seizure disorder  No Known Allergies  Patient Measurements: Height: 5\' 5"  (165.1 cm) Weight: 106 lb 0.7 oz (48.1 kg) IBW/kg (Calculated) : 61.5 Adjusted Body Weight:   Vital Signs: Temp: 98.8 F (37.1 C) (01/29 0646) Temp Source: Oral (01/29 0646) BP: 148/98 mmHg (01/29 0646) Pulse Rate: 103 (01/29 0646) Intake/Output from previous day: 01/28 0701 - 01/29 0700 In: 590.3 [P.O.:400; I.V.:40.3; IV Piggyback:150] Out: 1025 [Urine:1025] Intake/Output from this shift:    Labs:  Recent Labs  03/12/14 0240 03/13/14 0354 03/14/14 0600  WBC 5.7 7.7 7.9  HGB 9.7* 9.1* 11.0*  HCT 28.1* 26.2* 31.6*  PLT 87* 106* 145*  CREATININE 0.64 0.78 0.80  MG 1.7 1.4* 1.1*  PHOS 3.6  --   --   ALBUMIN 3.0* 2.9*  --   PROT 6.0 5.5*  --   AST 105* 102*  --   ALT 41 43  --   ALKPHOS 112 106  --   BILITOT 1.1 0.7  --    Estimated Creatinine Clearance: 76.8 mL/min (by C-G formula based on Cr of 0.8).   Microbiology: Recent Results (from the past 720 hour(s))  MRSA PCR Screening     Status: None   Collection Time: 03/10/14  2:44 AM  Result Value Ref Range Status   MRSA by PCR NEGATIVE NEGATIVE Final    Comment:        The GeneXpert MRSA Assay (FDA approved for NASAL specimens only), is one component of a comprehensive MRSA colonization surveillance program. It is not intended to diagnose MRSA infection nor to guide or monitor treatment for MRSA infections.   Clostridium Difficile by PCR     Status: Abnormal   Collection Time: 03/10/14  5:54 PM  Result Value Ref Range Status   C difficile by pcr POSITIVE (A) NEGATIVE Final    Comment: CRITICAL RESULT CALLED TO, READ BACK BY AND VERIFIED WITH: H.CHURCH,RN 03/11/14 0927 BY BSLADE     Medications:  Scheduled:  . amLODipine  10 mg Oral Daily  . artificial tears   Right Eye QHS  . folic acid  1 mg Oral Daily   Or  . folic acid  1 mg  Intravenous Daily  . metroNIDAZOLE  500 mg Oral 3 times per day  . multivitamin with minerals  1 tablet Oral Daily  . nicotine  21 mg Transdermal Daily  . phenytoin  300 mg Oral QHS  . polyvinyl alcohol  1 drop Right Eye TID AC & HS  . sodium chloride  3 mL Intravenous Q12H  . thiamine  100 mg Oral Q24H   Or  . thiamine IV  100 mg Intravenous Q24H    Assessment: 49yo male admitted with witnessed seizure due to alcohol withdrawal.  Phenytoin level was 8.5 on 1/28, correcting to 12.5 when considering low albumin of 2.9.  This is a steady state level s/p the loading dose he received in the ED on 1/24 at [facility].  He has had no further seizures.  His level was low on admit, but there are compliance issues.  Goal of Therapy:  Corrected Phenytoin level 10-20  Plan:  Continue Phenytoin 300mg  po qHS Monitor for seizures  Gracy Bruins, PharmD Clinical Pharmacist [facility]
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
## 2014-03-15 LAB — HEPATIC FUNCTION PANEL
ALBUMIN: 2.9 g/dL — AB (ref 3.5–5.2)
ALK PHOS: 109 U/L (ref 39–117)
ALT: 35 U/L (ref 0–53)
AST: 63 U/L — ABNORMAL HIGH (ref 0–37)
BILIRUBIN INDIRECT: 0.2 mg/dL — AB (ref 0.3–0.9)
BILIRUBIN TOTAL: 0.3 mg/dL (ref 0.3–1.2)
Bilirubin, Direct: 0.1 mg/dL (ref 0.0–0.5)
Total Protein: 5.8 g/dL — ABNORMAL LOW (ref 6.0–8.3)

## 2014-03-15 LAB — CBC WITH DIFFERENTIAL/PLATELET
Basophils Absolute: 0 10*3/uL (ref 0.0–0.1)
Basophils Relative: 1 % (ref 0–1)
EOS ABS: 0.1 10*3/uL (ref 0.0–0.7)
EOS PCT: 1 % (ref 0–5)
HCT: 29.3 % — ABNORMAL LOW (ref 39.0–52.0)
HEMOGLOBIN: 9.9 g/dL — AB (ref 13.0–17.0)
Lymphocytes Relative: 24 % (ref 12–46)
Lymphs Abs: 1.5 10*3/uL (ref 0.7–4.0)
MCH: 33.4 pg (ref 26.0–34.0)
MCHC: 33.8 g/dL (ref 30.0–36.0)
MCV: 99 fL (ref 78.0–100.0)
MONO ABS: 0.8 10*3/uL (ref 0.1–1.0)
MONOS PCT: 12 % (ref 3–12)
Neutro Abs: 4 10*3/uL (ref 1.7–7.7)
Neutrophils Relative %: 62 % (ref 43–77)
Platelets: 171 10*3/uL (ref 150–400)
RBC: 2.96 MIL/uL — ABNORMAL LOW (ref 4.22–5.81)
RDW: 14.8 % (ref 11.5–15.5)
WBC: 6.4 10*3/uL (ref 4.0–10.5)

## 2014-03-15 LAB — URINALYSIS, ROUTINE W REFLEX MICROSCOPIC
Glucose, UA: 100 mg/dL — AB
Hgb urine dipstick: NEGATIVE
Ketones, ur: 15 mg/dL — AB
NITRITE: POSITIVE — AB
Protein, ur: NEGATIVE mg/dL
Specific Gravity, Urine: 1.03 (ref 1.005–1.030)
Urobilinogen, UA: 0.2 mg/dL (ref 0.0–1.0)
pH: 5 (ref 5.0–8.0)

## 2014-03-15 LAB — BASIC METABOLIC PANEL
Anion gap: 8 (ref 5–15)
BUN: 7 mg/dL (ref 6–23)
CO2: 26 mmol/L (ref 19–32)
CREATININE: 0.83 mg/dL (ref 0.50–1.35)
Calcium: 8.8 mg/dL (ref 8.4–10.5)
Chloride: 101 mmol/L (ref 96–112)
GFR calc Af Amer: >90 mL/min (ref >90)
GFR calc non Af Amer: >90 mL/min (ref >90)
GLUCOSE: 94 mg/dL (ref 70–99)
Potassium: 3.4 mmol/L — ABNORMAL LOW (ref 3.5–5.1)
Sodium: 135 mmol/L (ref 135–145)

## 2014-03-15 LAB — MAGNESIUM: Magnesium: 1.6 mg/dL (ref 1.5–2.5)

## 2014-03-15 LAB — URINE MICROSCOPIC-ADD ON

## 2014-03-15 LAB — CK: Total CK: 138 U/L (ref 7–232)

## 2014-03-15 MED ORDER — MAGNESIUM OXIDE -MG SUPPLEMENT 400 (240 MG) MG PO TABS: 250.0000 mg | ORAL_TABLET | Freq: Every day | ORAL | Status: DC

## 2014-03-15 MED ORDER — PHENYTOIN SODIUM EXTENDED 300 MG PO CAPS: 300.0000 mg | ORAL_CAPSULE | Freq: Every day | ORAL | Status: DC

## 2014-03-15 MED ORDER — SODIUM CHLORIDE 0.9 % IV SOLN
INTRAVENOUS | Status: AC
  Administered 2014-03-15: 22:00:00 via INTRAVENOUS
  Administered 2014-03-15: 1000 mL via INTRAVENOUS

## 2014-03-15 MED ORDER — MAGNESIUM SULFATE 4 GM/100ML IV SOLN
4.0000 g | Freq: Once | INTRAVENOUS | Status: AC
  Administered 2014-03-15: 4 g via INTRAVENOUS
  Filled 2014-03-15: qty 100

## 2014-03-15 MED ORDER — POTASSIUM CHLORIDE CRYS ER 20 MEQ PO TBCR
40.0000 meq | EXTENDED_RELEASE_TABLET | Freq: Two times a day (BID) | ORAL | Status: AC
  Administered 2014-03-15 (×2): 40 meq via ORAL
  Filled 2014-03-15 (×2): qty 2

## 2014-03-15 MED ORDER — POTASSIUM CHLORIDE ER 20 MEQ PO TBCR: 20.0000 meq | EXTENDED_RELEASE_TABLET | Freq: Every day | ORAL | Status: DC

## 2014-03-15 MED ORDER — METRONIDAZOLE 500 MG PO TABS: 500.0000 mg | ORAL_TABLET | Freq: Three times a day (TID) | ORAL | Status: DC

## 2014-03-15 NOTE — Evaluation (Signed)
Physical Therapy Evaluation/ Discharge Patient Details Name: Joseph Underwood MRN: [id number] DOB: 11/21/1965 Today's Date: 03/15/2014   History of Present Illness  In brief, pt is a 49 y/o male with PMH of ETOH abuse and likely withdrawal seizures, HTN who p/w witnessed seizure. Pt was witnessed by mother to have a generalized tonic - clonic seizure lasting approx 15 minutes followed by a confused state for approx 30 min  Clinical Impression  Pt very pleasant and moving well with clear hall ambulation. Pt with balance deficits noted with single limb stance and narrow BOS. Pt educated for caution and need for assist with stairs, stepping over tub and any balance challenging area to have UE support. Recommend OPPT for balance with pt agreeable. Will defer all further balance training and assessment to OPPT. Education completed with pt aware and agreeable.     Follow Up Recommendations Outpatient PT    Equipment Recommendations  None recommended by PT    Recommendations for Other Services       Precautions / Restrictions Precautions Precautions: Fall      Mobility  Bed Mobility Overal bed mobility: Modified Independent                Transfers Overall transfer level: Modified independent                  Ambulation/Gait Ambulation/Gait assistance: Modified independent (Device/Increase time) Ambulation Distance (Feet): 350 Feet Assistive device: None Gait Pattern/deviations: WFL(Within Functional Limits)   Gait velocity interpretation: at or above normal speed for age/gender General Gait Details: pt able to complete gait with head turns and change of direction and speed  Stairs            Wheelchair Mobility    Modified Rankin (Stroke Patients Only)       Balance Overall balance assessment: Needs assistance   Sitting balance-Leahy Scale: Normal       Standing balance-Leahy Scale: Good   Single Leg Stance - Right Leg: 1 Single Leg Stance - Left  Leg: 1 Tandem Stance - Right Leg: 3 Tandem Stance - Left Leg: 3   [place] - Eyes Closed: 15 High level balance activites: Direction changes;Head turns High Level Balance Comments: pt able to turn 360 degrees bil, with looking over shoulder not able to turn fully around, able to pick object off the floor without LOB             Pertinent Vitals/Pain Pain Assessment: No/denies pain    Home Living Family/patient expects to be discharged to:: Private residence Living Arrangements: Other relatives Available Help at Discharge: Family Type of Home: House Home Access: Stairs to enter Entrance Stairs-Rails: Right Entrance Stairs-Number of Steps: 6 Home Layout: One level Home Equipment: None      Prior Function Level of Independence: Independent               Hand Dominance        Extremity/Trunk Assessment   Upper Extremity Assessment: Overall WFL for tasks assessed           Lower Extremity Assessment: Overall WFL for tasks assessed      Cervical / Trunk Assessment: Normal  Communication   Communication: No difficulties  Cognition Arousal/Alertness: Awake/alert Behavior During Therapy: WFL for tasks assessed/performed Overall Cognitive Status: Within Functional Limits for tasks assessed                      General Comments  Exercises        Assessment/Plan    PT Assessment All further PT needs can be met in the next venue of care  PT Diagnosis Abnormality of gait   PT Problem List Decreased balance  PT Treatment Interventions     PT Goals (Current goals can be found in the Care Plan section) Acute Rehab PT Goals PT Goal Formulation: All assessment and education complete, DC therapy    Frequency     Barriers to discharge        Co-evaluation               End of Session Equipment Utilized During Treatment: Gait belt Activity Tolerance: Patient tolerated treatment well Patient left: in bed;with call bell/phone within  Underwood;with bed alarm set;with nursing/sitter in room Nurse Communication: Mobility status;Precautions         Time: 7308-5694 PT Time Calculation (min) (ACUTE ONLY): 15 min   Charges:   PT Evaluation $Initial PT Evaluation Tier I: 1 Procedure PT Treatments $Therapeutic Activity: 8-22 mins   PT G CodesMelford Underwood 03/15/2014, 2:06 PM Joseph Underwood, [place]

## 2014-03-15 NOTE — Progress Notes (Signed)
NURSING PROGRESS NOTE  Joseph Underwood [id number] Discharge Data: 03/15/2014 2:35 PM Attending Provider: Aldine Contes, MD [id number], Gregary Signs, MD     [name] to be D/C'd Home per MD order.  Discussed with the patient the After Visit Summary and all questions fully answered. All IV's discontinued with no bleeding noted. All belongings returned to patient for patient to take home.   Last Vital Signs:  Blood pressure 134/79, pulse 88, temperature 98.2 F (36.8 C), temperature source Oral, resp. rate 18, height 5\' 5"  (1.651 m), weight 48.1 kg (106 lb 0.7 oz), SpO2 100 %.  Discharge Medication List   Medication List    TAKE these medications        amLODipine 10 MG tablet  Commonly known as:  NORVASC  Take 1 tablet (10 mg total) by mouth daily.     folic acid 1 MG tablet  Commonly known as:  FOLVITE  Take 1 tablet (1 mg total) by mouth daily.     Magnesium Oxide 400 (240 MG) MG Tabs  Take 250 mg by mouth daily.     metroNIDAZOLE 500 MG tablet  Commonly known as:  FLAGYL  Take 1 tablet (500 mg total) by mouth every 8 (eight) hours.     ondansetron 8 MG disintegrating tablet  Commonly known as:  ZOFRAN-ODT  Take 1 tablet (8 mg total) by mouth once.     phenytoin 300 MG ER capsule  Commonly known as:  DILANTIN  Take 1 capsule (300 mg total) by mouth at bedtime.     Potassium Chloride ER 20 MEQ Tbcr  Take 20 mEq by mouth daily.

## 2014-03-15 NOTE — Discharge Summary (Signed)
Name: Joseph Underwood MRN: [id number] DOB: 11/01/1965 49 y.o. PCP: Drucilla Schmidt, MD  Date of Admission: 03/09/2014  8:10 PM Date of Discharge: 03/15/2014 Attending Physician: Aldine Contes, MD  Discharge Diagnosis:   Alcohol withdrawal seizure   Encephalopathy acute, due to alcohol withdrawal   Hypokalemia   Hyponatremia   Hypomagnesemia    Anion gap metabolic acidosis   Elevated liver function enzymes   C. difficile colitis   Normocytic anemia    Essential hypertension, benign   Tobacco dependence   Protein-calorie malnutrition   Fall   Subconjunctival hemorrhage, OD     Discharge Medications:   Medication List    TAKE these medications        amLODipine 10 MG tablet  Commonly known as:  NORVASC  Take 1 tablet (10 mg total) by mouth daily.     folic acid 1 MG tablet  Commonly known as:  FOLVITE  Take 1 tablet (1 mg total) by mouth daily.     magnesium chloride 64 MG Tbec SR tablet  Commonly known as:  SLOW-MAG  Take 2 tablets (128 mg total) by mouth daily.     metroNIDAZOLE 500 MG tablet  Commonly known as:  FLAGYL  Take 1 tablet (500 mg total) by mouth every 8 (eight) hours.     ondansetron 8 MG disintegrating tablet  Commonly known as:  ZOFRAN-ODT  Take 1 tablet (8 mg total) by mouth once.     phenytoin 300 MG ER capsule  Commonly known as:  DILANTIN  Take 1 capsule (300 mg total) by mouth at bedtime.     Potassium Chloride ER 20 MEQ Tbcr  Take 20 mEq by mouth daily.        Disposition and follow-up:   Joseph Underwood was discharged from [facility] in Stable condition.  At the hospital follow up visit please address:  1.  Willingness to quit drinking.  Please provide EtOH cessation resources (refer to [facility] CSW) if he is willing to receive them.  2.  Labs / imaging needed at time of follow-up: CMP and Mg; pleaser order RUQ abd [place] to evaluate liver given CT findings  3.  Pending labs/ test needing follow-up: none  Follow-up  Appointments: Follow-up Information    Follow up with [place].   Why:  We will call you in 1-2 weeks for follow up   Contact information:   [address] [place] [phone]    03/21/14 10:15AM with Dr. Jerene Pitch  Discharge Instructions:  Driving restriction has been placed due to seizure history.   Consultations:  Intensive Care  Procedures Performed:  Ct Abdomen Pelvis Wo Contrast  03/13/2014   CLINICAL DATA:  Pt admitted after ETOH related seizures. Pt.s Hgb= 9.0; r/o retroperitoneal bleed. Pt denies any abdominal pain, has diarrhea "when he eats"; Positive for c-diff.  EXAM: CT ABDOMEN AND PELVIS WITHOUT CONTRAST  TECHNIQUE: Multidetector CT imaging of the abdomen and pelvis was performed following the standard protocol without IV contrast.  COMPARISON:  08/14/2013 and 10/27/2005  FINDINGS: Lung bases demonstrate mild posterior bibasilar linear atelectasis. There is a tiny amount of pericardial fluid  Abdominal images demonstrate mild diffuse hepatic steatosis. There is an 8 mm focal hypodensity over the lateral segment of the left lobe of the liver. Mild amount of dependent density within the gallbladder likely sludge versus small stones. The spleen is somewhat small but otherwise unremarkable. The pancreas and adrenal  glands are unremarkable. Kidneys are normal in size, shape and position without hydronephrosis nephrolithiasis. The ureters are unremarkable. There is mild calcified plaque over the abdominal aorta. Appendix is normal. There is mild diverticulosis of the colon without active inflammation. The no free fluid or inflammatory change within the abdomen.  Pelvic images demonstrate the bladder, prostate and rectum to be within normal. Possible tiny amount of free pelvic fluid. There are mild degenerative changes of the spine.  IMPRESSION: No acute findings in the abdomen/ pelvis.  Suggestion of mild gallbladder sludge versus  small stones.  Mild diffuse hepatic steatosis. Possible 8 mm hypodensity lateral segment left lobe of the liver. Recommend ultrasound versus a contrast-enhanced CT on an elective basis to confirm this finding.  Mild diverticulosis of the colon.  Tiny amount of free pelvic fluid.   Electronically Signed   By: Marin Olp M.D.   On: 03/13/2014 14:34   Ct Head Wo Contrast  03/10/2014   CLINICAL DATA:  Weakness seizure for 15 min. Patient fell with hematoma to the center of forehead. Patient typically drinks 1 pint per day and has not had anything to drink in 3 days.  EXAM: CT HEAD WITHOUT CONTRAST  TECHNIQUE: Contiguous axial images were obtained from the base of the skull through the vertex without intravenous contrast.  COMPARISON:  12/16/2013  FINDINGS: Mild diffuse cerebral atrophy. Mild prominence of ventricles consistent with central atrophy. No mass effect or midline shift. No abnormal extra-axial fluid collections. Gray-white matter junctions are distinct. Basal cisterns are not effaced. No evidence of acute intracranial hemorrhage. No depressed skull fractures. Mucosal thickening in the right maxillary antrum. Mastoid air cells are not opacified.  IMPRESSION: No acute intracranial abnormalities. Mild diffuse cerebral atrophy. No change since prior study.   Electronically Signed   By: Lucienne Capers M.D.   On: 03/10/2014 00:12    Admission HPI: Joseph Underwood is a 49 year old man with history of alcohol abuse and related seizures, HTN who presented to the [facility] with a witnessed seizure. History was limited as the patient did not recall the event and his mother was not available at the time of interview; it was primarily obtained by chart review. He was in his usual state of health when his mother witnessed a tonic-clonic seizure she thought lasted about 15 minutes with a subsequent postictal period of 30 minutes. He says he does not remember this event and the first thing he remembers is lying on the floor.  Per the EMR, his mother saw him hit his head but unclear on what. He denied any urinary or bladder incontinence. Joseph Underwood reported that he is not compliant with his prescribed phenytoin. His last drink was yesterday and he says he drinks about a pint of liquor a day. He was recently admitted in 12/2013 with a seizure in the setting of attempted alcohol cessation. He notes he has chronic hiccups. He says he only eats about one meal a day and named cabbage and bbq chicken as examples. He denies any chest pain, SOB, palpitations, fevers, chills, night sweats, headache, numbness, weakness, abdominal pain, nausea, emesis, diarrhea, constipation.  In the ED, he received phenytoin 1000 mg iv once, ativan 3 mg iv, 1 L NS, and KCL 10 mEq iv.  Hospital Course by problem list: Seizure secondary to alcohol withdrawal: Seizure likely precipitated by non-compliance with AED and alcohol withdrawal.  He had no further seizures during admission.  His withdrawal was initially managed in stepdown unit with prn  Ativan per CIWA protocol.  PCCM (Intensivist group) was consulted when the patient's symptoms were not responding to Ativan.  He was then placed on Precedex drip for overnight on hospital day 3.  He significantly improved and Precedex drip was discontinued on hospital day 4.  He was hemodynamically stable and displayed no symptoms of withdrawal and was discharged home in stable condition on hospital day 7.  There was concern of phenytoin non-compliance since phenytoin level was subtherapeutic.  He had no further seizures during admission.  Phenytoin 300 mg qhs was continued at discharge for seizure prevention. Patient advised that he cannot drive with this seizure history.  He is not ready to quit drinking.  Vitamin supplements and AED were continued at discharge.  He will be seen for hospital follow-up and continued education on alcohol cessation on 03/21/2014.  Hypomagnesemia/Hypokalemia/Hyponatremia: Alcohol abuse  is likely etiology with cdiff diarrhea exacerbation magnesium losses. K and Mg were repleted as needed.  Kdur 85mEq daily and Slow Mag 128mg  daily were prescribed at d/c.  He will need CMP and Mg check at [facility] follow-up.  Please determine if he will need continued Mg and K supplementation.  C. Difficile Colitis: Appeared community acquired.  He was treated with Flagyl IV and then transition to po.  His diarrhea symptoms improved. Flagyl 500 mg po q8h was continued at discharge; end date 03/25/14.  Anion gap metabolic acidosis:  AG 18 with a normal bicarb at admission.  Acidosis likely related to alcohol abuse.  Lactic acid also elevated likely due to seizure.    Elevated liver function enzymes:  AST 154, ALT 59 at admission likely due to alcohol abuse.  This is supported by his history, moderated elevation and  2:1 AST:ALT ratio.  Levels decreased during admission with AST still slightly elevated but improved at 63 prior to discharge.  Please follow-up CMP at [facility] follow[facility]up.  Elevated alkaline phosphatase:  173 at admission but trended down to normal prior to discharge.  CT abdomen and pelvis performed during admission to rule out retroperitoneal bleed revealed mild GB sludge versus small stones and mild diffuse hepatic steatosis.  There was possibly a 24mm hypodensity in the lateral segment of the left lobe of the liver.  US imaging versus contrast enhanced CT was recommended for follow-up.  Will plan to order RUQ [place] at [facility] follow-up to evaluate possible liver finding.  Normocytic Anemia:  Hemoglobin was stable and he denied blood loss.  No evidence of retroperitoneal bleeding on CT.  Will plan to repeat CBC and iron studies in [facility].  Subconjunctival hemorrhage, right eye:  He denied vision abnormalities.  There was initial concern for bacterial conjunctivitis and he was treated with erythromycin for four days, however it became evident that he likely did not have an infection.  Hypertension: Some  elevations early in admission likely due to withdrawal.  Home amlodipine continued.  Discharge Vitals:   BP 134/79 mmHg  Pulse 88  Temp(Src) 98.2 F (36.8 C) (Oral)  Resp 18  Ht 5\' 5"  (1.651 m)  Wt 106 lb 0.7 oz (48.1 kg)  BMI 17.65 kg/m2  SpO2 100%  Discharge Labs:  Results for orders placed or performed during the hospital encounter of 03/09/14 (from the past 24 hour(s))  CBC with Differential/Platelet     Status: Abnormal   Collection Time: 03/15/14  4:55 AM  Result Value Ref Range   WBC 6.4 4.0 - 10.5 K/uL   RBC 2.96 (L) 4.22 - 5.81 MIL/uL   Hemoglobin  9.9 (L) 13.0 - 17.0 g/dL   HCT 29.3 (L) 39.0 - 52.0 %   MCV 99.0 78.0 - 100.0 fL   MCH 33.4 26.0 - 34.0 pg   MCHC 33.8 30.0 - 36.0 g/dL   RDW 14.8 11.5 - 15.5 %   Platelets 171 150 - 400 K/uL   Neutrophils Relative % 62 43 - 77 %   Neutro Abs 4.0 1.7 - 7.7 K/uL   Lymphocytes Relative 24 12 - 46 %   Lymphs Abs 1.5 0.7 - 4.0 K/uL   Monocytes Relative 12 3 - 12 %   Monocytes Absolute 0.8 0.1 - 1.0 K/uL   Eosinophils Relative 1 0 - 5 %   Eosinophils Absolute 0.1 0.0 - 0.7 K/uL   Basophils Relative 1 0 - 1 %   Basophils Absolute 0.0 0.0 - 0.1 K/uL  Basic metabolic panel     Status: Abnormal   Collection Time: 03/15/14  4:55 AM  Result Value Ref Range   Sodium 135 135 - 145 mmol/L   Potassium 3.4 (L) 3.5 - 5.1 mmol/L   Chloride 101 96 - 112 mmol/L   CO2 26 19 - 32 mmol/L   Glucose, Bld 94 70 - 99 mg/dL   BUN 7 6 - 23 mg/dL   Creatinine, Ser 0.83 0.50 - 1.35 mg/dL   Calcium 8.8 8.4 - 10.5 mg/dL   GFR calc non Af Amer >90 >90 mL/min   GFR calc Af Amer >90 >90 mL/min   Anion gap 8 5 - 15  Magnesium     Status: None   Collection Time: 03/15/14  4:55 AM  Result Value Ref Range   Magnesium 1.6 1.5 - 2.5 mg/dL    Signed: Francesca Oman, DO 03/17/2014, 8:40 AM    Services Ordered on Discharge: Outpatient PT Equipment Ordered on Discharge: none

## 2014-03-15 NOTE — Progress Notes (Signed)
Subjective: Doing well this AM. Required no Ativan overnight. Still w/ right eye redness and clear drainage. No signs of withdrawal currently. No tremors.   Objective: Vital signs in last 24 hours: Filed Vitals:   03/14/14 2118 03/15/14 0120 03/15/14 0413 03/15/14 0625  BP: 130/85 128/86 137/91 134/79  Pulse: 102 87 94 88  Temp: 98.7 F (37.1 C) 98.5 F (36.9 C) 98.2 F (36.8 C) 98.2 F (36.8 C)  TempSrc: Oral Oral Oral Oral  Resp: 16 18 18 18   Height:      Weight:      SpO2: 97% 98% 100% 100%   Weight change:   Intake/Output Summary (Last 24 hours) at 03/15/14 0753 Last data filed at 03/14/14 1830  Gross per 24 hour  Intake    120 ml  Output      0 ml  Net    120 ml   Physical Exam: General: Thin-appearing AA male, alert, cooperative, NAD. HEENT: PERRL, EOMI. Moist mucus membranes. Right eye erythema and serous drainage.  Neck: Full range of motion without pain, supple, no lymphadenopathy or carotid bruits Lungs: Clear to ascultation bilaterally, normal work of respiration, no wheezes, rales, rhonchi Heart: RRR, no murmurs, gallops, or rubs Abdomen: Soft, non-tender, non-distended, BS + Extremities: No cyanosis, clubbing, or edema Neurologic: Alert & oriented X3, cranial nerves II-XII intact, strength grossly intact, sensation intact to light touch    Lab Results: Basic Metabolic Panel:  Recent Labs Lab 03/10/14 0304  03/12/14 0240  03/14/14 0600 03/15/14 0455  NA 136  < > 133*  < > 135 135  K 2.9*  < > 3.6  < > 3.4* 3.4*  CL 91*  < > 101  < > 97 101  CO2 30  < > 26  < > 28 26  GLUCOSE 116*  < > 97  < > 88 94  BUN <5*  < > <5*  < > <5* 7  CREATININE 0.92  < > 0.64  < > 0.80 0.83  CALCIUM 8.2*  < > 8.4  < > 9.3 8.8  MG 1.1*  < > 1.7  < > 1.1* 1.6  PHOS 4.5  --  3.6  --   --   --   < > = values in this interval not displayed. Liver Function Tests:  Recent Labs Lab 03/12/14 0240 03/13/14 0354  AST 105* 102*  ALT 41 43  ALKPHOS 112 106  BILITOT  1.1 0.7  PROT 6.0 5.5*  ALBUMIN 3.0* 2.9*   CBC:  Recent Labs Lab 03/14/14 0600 03/15/14 0455  WBC 7.9 6.4  NEUTROABS 4.8 4.0  HGB 11.0* 9.9*  HCT 31.6* 29.3*  MCV 98.1 99.0  PLT 145* 171   Cardiac Enzymes:  Recent Labs Lab 03/10/14 0304 03/10/14 1047 03/10/14 1540  CKTOTAL 304*  --   --   TROPONINI 0.07* 0.06* 0.06*   Thyroid Function Tests:  Recent Labs Lab 03/10/14 0304  TSH 0.664   Urine Drug Screen: Drugs of Abuse     Component Value Date/Time   LABOPIA NONE DETECTED 03/10/2014 0226   COCAINSCRNUR NONE DETECTED 03/10/2014 0226   LABBENZ NONE DETECTED 03/10/2014 0226   AMPHETMU NONE DETECTED 03/10/2014 0226   THCU NONE DETECTED 03/10/2014 0226   LABBARB NONE DETECTED 03/10/2014 0226    Urinalysis:  Recent Labs Lab 03/10/14 0226  COLORURINE YELLOW  LABSPEC 1.008  PHURINE 7.0  GLUCOSEU NEGATIVE  HGBUR TRACE*  BILIRUBINUR NEGATIVE  KETONESUR NEGATIVE  PROTEINUR 30*  UROBILINOGEN 1.0  NITRITE NEGATIVE  LEUKOCYTESUR NEGATIVE    Medications:  I have reviewed the patient's current medications. Prior to Admission:  Prescriptions prior to admission  Medication Sig Dispense Refill Last Dose  . amLODipine (NORVASC) 10 MG tablet Take 1 tablet (10 mg total) by mouth daily. 30 tablet 2 03/09/2014 at Unknown time  . folic acid (FOLVITE) 1 MG tablet Take 1 tablet (1 mg total) by mouth daily. 30 tablet 6 03/09/2014 at Unknown time  . phenytoin (DILANTIN) 100 MG ER capsule Take 300 mg by mouth at bedtime.   03/08/2014 at Unknown time  . ondansetron (ZOFRAN-ODT) 8 MG disintegrating tablet Take 1 tablet (8 mg total) by mouth once. 20 tablet 0    Scheduled Meds: . amLODipine  10 mg Oral Daily  . artificial tears   Right Eye QHS  . folic acid  1 mg Oral Daily   Or  . folic acid  1 mg Intravenous Daily  . magnesium sulfate 1 - 4 g bolus IVPB  4 g Intravenous Once  . metroNIDAZOLE  500 mg Oral 3 times per day  . multivitamin with minerals  1 tablet Oral Daily   . nicotine  21 mg Transdermal Daily  . phenytoin  300 mg Oral QHS  . potassium chloride  40 mEq Oral BID  . sodium chloride  3 mL Intravenous Q12H  . thiamine  100 mg Oral Q24H   Or  . thiamine IV  100 mg Intravenous Q24H   Continuous Infusions:   PRN Meds:.sodium chloride, LORazepam, promethazine **OR** promethazine **OR** promethazine   Assessment/Plan: 49 y/o M w/ PMHx of withdrawal related seizures, and alcohol withdrawal, admitted for seizure and withdrawal.   Alcohol Withdrawal: Significantly improved, did not require ativan overnight. No agitation. Discussed alcohol abuse at length with patient, says he would in fact like some information regarding rehab facilities, etc. Did not provide a clear answer about whether he was actually ready to stop using alcohol. Appetite improved, ambulating well.  -Continue Multivitamin + Folic acid 1 mg po daily -Social work to see patient regarding abstinence from alcohol.  Hypomagnesemia/Hypokalemia: K 3.4 this AM, Mag 1.6 -Give K-dur 40 mg daily -Given Magnesium Sulfate 4 g -Will also give K-dur 20 mg daily on discharge + Mag-Ox   Seizure 2/2 Alcohol Withdrawal: No further seizures. Phenytoin level therapeutic on 300 mg daily. Had previously been taking 100 mg daily, but was also likely non-compliant.  -Continue Phenytoin 300 mg qhs for seizures.  -Patient advised that he cannot drive with this seizure history  C. Difficile Colitis: Bowel movements improved.  -Continue Flagyl 500 mg q8h; end date 03/25/14  Normocytic Anemia: Hb stable.  -Repeat CBC and iron studies in the clinic  Hypertension: Controlled.  -Continue Norvasc 10 mg daily  DVT/PE PPx: SCD's  Dispo: Anticipated discharge today.   The patient does have a current PCP Drucilla Schmidt, MD) and does not need an [facility] hospital follow-up appointment after discharge.  The patient does not have transportation limitations that hinder transportation to clinic  appointments.  .Services Needed at time of discharge: Y = Yes, Blank = No PT:   OT:   RN:   Equipment:   Other:     LOS: 6 days    Luanne Bras, MD 03/15/2014 11:43 AM

## 2014-03-16 DIAGNOSIS — D649 Anemia, unspecified: Secondary | ICD-10-CM

## 2014-03-16 DIAGNOSIS — H1131 Conjunctival hemorrhage, right eye: Secondary | ICD-10-CM

## 2014-03-16 DIAGNOSIS — E871 Hypo-osmolality and hyponatremia: Secondary | ICD-10-CM

## 2014-03-16 DIAGNOSIS — R7989 Other specified abnormal findings of blood chemistry: Secondary | ICD-10-CM

## 2014-03-16 DIAGNOSIS — G40509 Epileptic seizures related to external causes, not intractable, without status epilepticus: Secondary | ICD-10-CM

## 2014-03-16 LAB — CBC
HCT: 26.3 % — ABNORMAL LOW (ref 39.0–52.0)
HEMOGLOBIN: 8.9 g/dL — AB (ref 13.0–17.0)
MCH: 33.8 pg (ref 26.0–34.0)
MCHC: 33.8 g/dL (ref 30.0–36.0)
MCV: 100 fL (ref 78.0–100.0)
Platelets: 231 10*3/uL (ref 150–400)
RBC: 2.63 MIL/uL — AB (ref 4.22–5.81)
RDW: 14.3 % (ref 11.5–15.5)
WBC: 6.3 10*3/uL (ref 4.0–10.5)

## 2014-03-16 LAB — BASIC METABOLIC PANEL
ANION GAP: 6 (ref 5–15)
BUN: <5 mg/dL — ABNORMAL LOW (ref 6–23)
CO2: 24 mmol/L (ref 19–32)
Calcium: 8.6 mg/dL (ref 8.4–10.5)
Chloride: 104 mmol/L (ref 96–112)
Creatinine, Ser: 0.66 mg/dL (ref 0.50–1.35)
GFR calc Af Amer: >90 mL/min (ref >90)
GLUCOSE: 95 mg/dL (ref 70–99)
Potassium: 4.1 mmol/L (ref 3.5–5.1)
SODIUM: 134 mmol/L — AB (ref 135–145)

## 2014-03-16 LAB — MAGNESIUM: MAGNESIUM: 1.3 mg/dL — AB (ref 1.5–2.5)

## 2014-03-16 MED ORDER — MAGNESIUM CHLORIDE 64 MG PO TBEC
2.0000 | DELAYED_RELEASE_TABLET | Freq: Once | ORAL | Status: AC
  Administered 2014-03-16: 13:00:00 via ORAL
  Filled 2014-03-16: qty 2

## 2014-03-16 MED ORDER — MAGNESIUM CHLORIDE 64 MG PO TBEC: 2.0000 | DELAYED_RELEASE_TABLET | Freq: Every day | ORAL | Status: DC

## 2014-03-16 MED ORDER — METRONIDAZOLE 500 MG PO TABS: 500.0000 mg | ORAL_TABLET | Freq: Three times a day (TID) | ORAL | Status: DC

## 2014-03-16 NOTE — Social Work (Signed)
CSW met with patient and provide substance abuse resources and substance abuse education.  Patient could identify that his alcohol consumption was problematic but stated that he was not willing to stop drinking at this time. In addition to a list of substance abuse and mental health resources, CSW also encouraged patient (who is uninsured) that he could get services at [facility] in [place].  Patient Discharging, no further needs.  Christene Lye MSW, [place]

## 2014-03-16 NOTE — Discharge Instructions (Addendum)
1. Please schedule a follow up appointment as follows:   [place]  We will call you in 1-2 weeks for follow up  [address] [phone]  2. Please take all medications as prescribed.   PLEASE TAKE YOUR DILANTIN. I have sent in a prescription for 300 mg every night; this is an increase from your previous prescription.   I have also sent in prescriptions for Flagyl for your diarrhea. Please take 500 mg three times daily until all pills are gone. Only take one pill tonight.  Take one pill every 8 hours starting tomorrow.   Take Magnesium Chloride 128 mg daily and Potassium 20 mEq daily.  We will check your labs in the clinic when you come for hospital follow-up appointment.   I have arranged for outpatient physical therapy for you.   PLEASE ABSTAIN FROM ALCOHOL USE.   3. If you have worsening of your symptoms or new symptoms arise, please call the clinic ([phone]), or go to the ER immediately if symptoms are severe.   Alcohol Withdrawal Anytime drug use is interfering with normal living activities it has become abuse. This includes problems with family and friends. Psychological dependence has developed when your mind tells you that the drug is needed. This is usually followed by physical dependence when a continuing increase of drugs are required to get the same feeling or "high." This is known as addiction or chemical dependency. A person's risk is much higher if there is a history of chemical dependency in the family. Mild Withdrawal Following Stopping Alcohol, When Addiction or Chemical Dependency Has Developed When a person has developed tolerance to alcohol, any sudden stopping of alcohol can cause uncomfortable physical symptoms. Most of the time these are mild and consist of tremors in the hands and increases in heart rate, breathing, and temperature. Sometimes these symptoms are associated with anxiety, panic attacks,  and bad dreams. There may also be stomach upset. Normal sleep patterns are often interrupted with periods of inability to sleep (insomnia). This may last for 6 months. Because of this discomfort, many people choose to continue drinking to get rid of this discomfort and to try to feel normal. Severe Withdrawal with Decreased or No Alcohol Intake, When Addiction or Chemical Dependency Has Developed About five percent of alcoholics will develop signs of severe withdrawal when they stop using alcohol. One sign of this is development of generalized seizures (convulsions). Other signs of this are severe agitation and confusion. This may be associated with believing in things which are not real or seeing things which are not really there (delusions and hallucinations). Vitamin deficiencies are usually present if alcohol intake has been long-term. Treatment for this most often requires hospitalization and close observation. Addiction can only be helped by stopping use of all chemicals. This is hard but may save your life. With continual alcohol use, possible outcomes are usually loss of self respect and esteem, violence, and death. Addiction cannot be cured but it can be stopped. This often requires outside help and the care of professionals. Treatment centers are listed in the yellow pages under Cocaine, Narcotics, and Alcoholics Anonymous. Most hospitals and clinics can refer you to a specialized care center. It is not necessary for you to go through the uncomfortable symptoms of withdrawal. Your caregiver can provide you with medicines that will help you through this difficult period. Try to avoid situations, friends, or drugs that made it possible for you to  keep using alcohol in the past. Learn how to say no. It takes a long period of time to overcome addictions to all drugs, including alcohol. There may be many times when you feel as though you want a drink. After getting rid of the physical addiction and  withdrawal, you will have a lessening of the craving which tells you that you need alcohol to feel normal. Call your caregiver if more support is needed. Learn who to talk to in your family and among your friends so that during these periods you can receive outside help. Alcoholics Anonymous (AA) has helped many people over the years. To get further help, contact AA or call your caregiver, counselor, or clergyperson. Al-Anon and Alateen are support groups for friends and family members of an alcoholic. The people who love and care for an alcoholic often need help, too. For information about these organizations, check your phone directory or call a local alcoholism treatment center.  SEEK IMMEDIATE MEDICAL CARE IF:   You have a seizure.  You have a fever.  You experience uncontrolled vomiting or you vomit up blood. This may be bright red or look like black coffee grounds.  You have blood in the stool. This may be bright red or appear as a black, tarry, bad-smelling stool.  You become lightheaded or faint. Do not drive if you feel this way. Have someone else drive you or call [phone] for help.  You become more agitated or confused.  You develop uncontrolled anxiety.  You begin to see things that are not really there (hallucinate). Your caregiver has determined that you completely understand your medical condition, and that your mental state is back to normal. You understand that you have been treated for alcohol withdrawal, have agreed not to drink any alcohol for a minimum of 1 day, will not operate a car or other machinery for 24 hours, and have had an opportunity to ask any questions about your condition. Document Released: 11/10/2004 Document Revised: 04/25/2011 Document Reviewed: 09/19/2007 [facility] Patient Information 2015 [place], [place]. This information is not intended to replace advice given to you by your health care provider. Make sure you discuss any questions you have with your health  care provider.

## 2014-03-16 NOTE — Progress Notes (Signed)
Subjective: No acute events overnight.  Patient seen and examined this AM.  He denies anxiety/tremor.  Feels ready for d/c.  He says his urine is back to normal color today and his diarrhea is improving.  He denies dysuria or increased urinary frequency.  His appetite is good.  He agrees to take medications at d/c and follow-up in clinic.  He is noncommittal to quitting EtOH but says he is working on it.  Objective: Vital signs in last 24 hours: Filed Vitals:   03/15/14 1435 03/15/14 1947 03/15/14 2059 03/16/14 0518  BP: 114/74 133/55 126/79 142/90  Pulse: 102 86 100 93  Temp: 99.6 F (37.6 C) 97.9 F (36.6 C) 98.7 F (37.1 C) 98.3 F (36.8 C)  TempSrc: Oral Oral Oral Oral  Resp: 16  18 16   Height:      Weight:      SpO2: 96% 96% 97% 100%   Weight change:   Intake/Output Summary (Last 24 hours) at 03/16/14 0819 Last data filed at 03/16/14 1194  Gross per 24 hour  Intake   2690 ml  Output    200 ml  Net   2490 ml   Physical Exam: General: Thin-appearing AA male, alert, cooperative, NAD, sitting up on side of bed HEENT: EOMI, MMM, right eye subconjunctival erythema  Lungs: Clear to ascultation bilaterally, normal work of respiration, no wheezes, rales, rhonchi Heart: RRR, no murmurs, gallops, or rubs Abdomen: Soft, non-tender, non-distended, BS + Extremities: No cyanosis, clubbing, or edema Neurologic: Alert & oriented X3, responding appropriately, following commands, strength grossly intact upper and lower extremities B/L  Lab Results: Basic Metabolic Panel:  Recent Labs Lab 03/10/14 0304  03/12/14 0240  03/15/14 0455 03/16/14 0450  NA 136  < > 133*  < > 135 134*  K 2.9*  < > 3.6  < > 3.4* 4.1  CL 91*  < > 101  < > 101 104  CO2 30  < > 26  < > 26 24  GLUCOSE 116*  < > 97  < > 94 95  BUN <5*  < > <5*  < > 7 <5*  CREATININE 0.92  < > 0.64  < > 0.83 0.66  CALCIUM 8.2*  < > 8.4  < > 8.8 8.6  MG 1.1*  < > 1.7  < > 1.6 1.3*  PHOS 4.5  --  3.6  --   --   --   < >  = values in this interval not displayed.   CBC:  Recent Labs Lab 03/14/14 0600 03/15/14 0455 03/16/14 0450  WBC 7.9 6.4 6.3  NEUTROABS 4.8 4.0  --   HGB 11.0* 9.9* 8.9*  HCT 31.6* 29.3* 26.3*  MCV 98.1 99.0 100.0  PLT 145* 171 231    Medications:  I have reviewed the patient's current medications.  Prescriptions prior to admission  Medication Sig Dispense Refill Last Dose  . amLODipine (NORVASC) 10 MG tablet Take 1 tablet (10 mg total) by mouth daily. 30 tablet 2 03/09/2014 at Unknown time  . folic acid (FOLVITE) 1 MG tablet Take 1 tablet (1 mg total) by mouth daily. 30 tablet 6 03/09/2014 at Unknown time  . [DISCONTINUED] phenytoin (DILANTIN) 100 MG ER capsule Take 300 mg by mouth at bedtime.   03/08/2014 at Unknown time  . ondansetron (ZOFRAN-ODT) 8 MG disintegrating tablet Take 1 tablet (8 mg total) by mouth once. 20 tablet 0    Scheduled Meds: . amLODipine  10 mg Oral Daily  .  artificial tears   Right Eye QHS  . folic acid  1 mg Oral Daily   Or  . folic acid  1 mg Intravenous Daily  . metroNIDAZOLE  500 mg Oral 3 times per day  . multivitamin with minerals  1 tablet Oral Daily  . nicotine  21 mg Transdermal Daily  . phenytoin  300 mg Oral QHS  . sodium chloride  3 mL Intravenous Q12H  . thiamine  100 mg Oral Q24H   Or  . thiamine IV  100 mg Intravenous Q24H   Continuous Infusions: none   PRN Meds:.sodium chloride, LORazepam, promethazine **OR** promethazine **OR** promethazine   Assessment/Plan: 49 y/o M w/ PMHx of withdrawal related seizures, and alcohol withdrawal, admitted for seizure and withdrawal.   Alcohol Withdrawal:  Significantly improved.  CIWA 0 overnight.  No w/d symptoms.   He is contemplating quitting and is willing to hear about resources.  He agrees to follow-up in clinic.   -continue Folic acid 1 mg po daily -social work to see patient regarding abstinence from alcohol  -will continue to address EtOH cessation with the help of [facility]  CSW  Hypomagnesemia/Hypokalemia:  Chronic.  EtOH abuse is likely etiology with cdiff diarrhea exacerbation mag losses.  K 4.1 this AM. Mag 1.3 this AM s/p 4g IV Mag on 01/30. -start slow mag 128mg  daily and continue at d/c until clinic follow-up next week -d/c on Kdur 20mEq daily -repeat BMP/Mg at [facility] follow-up  Seizure 2/2 Alcohol Withdrawal: No further seizures. Phenytoin level therapeutic on 300 mg daily. Had previously been taking 100 mg daily, but was also likely non-compliant.  -Continue Phenytoin 300 mg qhs for seizures.  -Patient advised that he cannot drive with this seizure history.  C. Difficile Colitis:  He reports improvement in diarrhea. -Continue Flagyl 500 mg q8h; end date 03/25/14  Normocytic Anemia: Hb stable.  He denies dark urine/dark stools/gross blood loss.   -Repeat CBC and iron studies in [facility].  Hypertension: Controlled.  -Continue Norvasc 10 mg daily  DVT/PE PPx: SCD's  Dispo: Anticipated discharge today.   The patient does have a current PCP Drucilla Schmidt, MD) and does not need an [facility] hospital follow-up appointment after discharge.  The patient does not have transportation limitations that hinder transportation to clinic appointments.  .Services Needed at time of discharge: Y = Yes, Blank = No PT: Y  OT:   RN:   Equipment:   Other:     LOS: 7 days    Duwaine Maxin, DO 03/16/2014 8:19 AM

## 2014-03-21 ENCOUNTER — Encounter: Payer: Self-pay | Admitting: Internal Medicine

## 2014-03-21 ENCOUNTER — Ambulatory Visit (INDEPENDENT_AMBULATORY_CARE_PROVIDER_SITE_OTHER): Payer: Self-pay | Admitting: Internal Medicine

## 2014-03-21 VITALS — BP 164/96 | HR 121 | Temp 98.0°F | Ht 65.0 in | Wt 112.2 lb

## 2014-03-21 DIAGNOSIS — K769 Liver disease, unspecified: Secondary | ICD-10-CM

## 2014-03-21 DIAGNOSIS — F10931 Unspecified convulsions: Secondary | ICD-10-CM

## 2014-03-21 DIAGNOSIS — D649 Anemia, unspecified: Secondary | ICD-10-CM

## 2014-03-21 DIAGNOSIS — E538 Deficiency of other specified B group vitamins: Secondary | ICD-10-CM

## 2014-03-21 DIAGNOSIS — R569 Unspecified convulsions: Secondary | ICD-10-CM

## 2014-03-21 DIAGNOSIS — K7689 Other specified diseases of liver: Secondary | ICD-10-CM

## 2014-03-21 DIAGNOSIS — F10231 Alcohol dependence with withdrawal delirium: Secondary | ICD-10-CM

## 2014-03-21 DIAGNOSIS — A047 Enterocolitis due to Clostridium difficile: Secondary | ICD-10-CM

## 2014-03-21 DIAGNOSIS — F1010 Alcohol abuse, uncomplicated: Secondary | ICD-10-CM

## 2014-03-21 DIAGNOSIS — A0472 Enterocolitis due to Clostridium difficile, not specified as recurrent: Secondary | ICD-10-CM

## 2014-03-21 DIAGNOSIS — H1131 Conjunctival hemorrhage, right eye: Secondary | ICD-10-CM

## 2014-03-21 DIAGNOSIS — D539 Nutritional anemia, unspecified: Secondary | ICD-10-CM

## 2014-03-21 DIAGNOSIS — I10 Essential (primary) hypertension: Secondary | ICD-10-CM

## 2014-03-21 DIAGNOSIS — E876 Hypokalemia: Secondary | ICD-10-CM

## 2014-03-21 LAB — CBC
HCT: 34 % — ABNORMAL LOW (ref 39.0–52.0)
HEMOGLOBIN: 11.7 g/dL — AB (ref 13.0–17.0)
MCH: 34 pg (ref 26.0–34.0)
MCHC: 34.4 g/dL (ref 30.0–36.0)
MCV: 98.8 fL (ref 78.0–100.0)
Platelets: 555 10*3/uL — ABNORMAL HIGH (ref 150–400)
RBC: 3.44 MIL/uL — ABNORMAL LOW (ref 4.22–5.81)
RDW: 14 % (ref 11.5–15.5)
WBC: 9.6 10*3/uL (ref 4.0–10.5)

## 2014-03-21 MED ORDER — AMLODIPINE BESYLATE 10 MG PO TABS: 10.0000 mg | ORAL_TABLET | Freq: Every day | ORAL | Status: DC

## 2014-03-21 NOTE — Progress Notes (Addendum)
Subjective:   Patient ID: Joseph Underwood male   DOB: Nov 21, 1965 49 y.o.   MRN: [id number]  HPI: [name] is a 49 y.o. male with chronic alcohol dependence presenting to opc today for hospital follow up visit.   Alcohol withdrawal and seizures--d/c 03/15/14. On dilantin, apparently maybe not compliant prior to admission. Continued to 300mg  daily which he brought his medicine with him today and says has been taking. No more seizures since discharge. Continues to drink alcohol, 1/5th of gin lasts him 2 days. Last drink yesterday evening 7pm. Lives at home with his mother. Not interested in social work consult today for alcohol cessation. No intention to quit at this time. He is driving and I stressed to him again that he cannot drive and he will have someone pick him up from clinic today.   HTN--elevated. Has not been taking norvasc. He did not know that was prescribed and did not have it with him today. Re-iterated to start norvasc again. BP slightly improved on repeat.   Cdiff--diarrhea resolved. Had some crampy abdominal pain this morning that has since then resolved. Still on flagyl. Stop day 2/9.   Hypomag and K--taking supplementation.     Hepatic lesion on imaging--CT a/p during admission without contrast showed: Mild diffuse hepatic steatosis. Possible 8 mm hypodensity lateral segment left lobe of the liver. Recommend ultrasound versus a contrast-enhanced CT on an elective basis to confirm this finding.  Past Medical History  Diagnosis Date  . Alcohol abuse   . Hiccups   . HTN (hypertension)   . Alcohol related seizure    Current Outpatient Prescriptions  Medication Sig Dispense Refill  . magnesium chloride (SLOW-MAG) 64 MG TBEC SR tablet Take 2 tablets (128 mg total) by mouth daily. 60 tablet 0  . metroNIDAZOLE (FLAGYL) 500 MG tablet Take 1 tablet (500 mg total) by mouth every 8 (eight) hours. 28 tablet 0  . phenytoin (DILANTIN) 100 MG ER capsule   2  . potassium chloride  20 MEQ TBCR Take 20 mEq by mouth daily. 30 tablet 2  . amLODipine (NORVASC) 10 MG tablet Take 1 tablet (10 mg total) by mouth daily. 30 tablet 2  . folic acid (FOLVITE) 1 MG tablet Take 1 tablet (1 mg total) by mouth daily. (Patient not taking: Reported on 03/21/2014) 30 tablet 6  . ondansetron (ZOFRAN-ODT) 8 MG disintegrating tablet Take 1 tablet (8 mg total) by mouth once. (Patient not taking: Reported on 03/21/2014) 20 tablet 0   No current facility-administered medications for this visit.   Family History  Problem Relation Age of Onset  . Hypertension Mother    History   Social History  . Marital Status: Single    Spouse Name: N/A    Number of Children: N/A  . Years of Education: N/A   Social History Main Topics  . Smoking status: Current Every Day Smoker -- 0.50 packs/day for 37 years    Types: Cigarettes  . Smokeless tobacco: Never Used  . Alcohol Use: 52.8 oz/week    14 Cans of beer, 74 Shots of liquor per week     Comment: 12/16/2013 "~ 1 pint of liquor per day"  . Drug Use: No  . Sexual Activity: No   Other Topics Concern  . None   Social History Narrative   Review of Systems:  Constitutional:  Denies fever, chills  HEENT:  R eye hemorrhage that is improving per patient  Respiratory:  Denies SOB  Cardiovascular:  Denies  chest pain  Gastrointestinal:  Abdominal pain this AM  Musculoskeletal:  Denies gait problem or recent falls  Skin:  Dry   Neurological:  Denies recent seizures or headache   Objective:  Physical Exam: Filed Vitals:   03/21/14 1046 03/21/14 1120  BP: 175/105 164/96  Pulse: 121   Temp: 98 F (36.7 C)   TempSrc: Oral   Height: 5\' 5"  (1.651 m)   Weight: 112 lb 3.2 oz (50.894 kg)   SpO2: 100%    Vitals reviewed. General: sitting in chair, NAD HEENT: EOMI, NAD, right eye conjunctival injection, hemorrhage, no pain in eye or vision change per patient Cardiac:tachycardia Pulm: clear to auscultation bilaterally, no wheezes, rales, or  rhonchi Abd: soft, nontender, nondistended, BS present Ext: thin extremities, no pedal edema, tremor of hands especially when pointing or holding hands up Neuro: alert and oriented X3, finger to nose in tact but hands shaking when pointing, no dysdiadochokinesia, stable gait unassisted  Assessment & Plan:  Discussed with Dr. Daryll Drown

## 2014-03-21 NOTE — Patient Instructions (Addendum)
General Instructions:  Thank you for bringing your medicines today. This helps [place] keep you safe from mistakes.  Your blood pressure was high today likely because you have not been taking your BP medication. Please restart norvasc 10mg  daily and follow up with Dr. Sherrine Maples on next available.   Please continue your flagyl antibiotics for Cdiff, end date should be 2/9  Please try to stop drinking alcohol and consider getting assistance. Our Education officer, museum can try to help if needed as well.   If you have any more seizures please let us know right away and go to the emergency room. Continue to take your dilantin leverl daily.   Please reconsider getting flu vaccine  You are NOT to drive due to your seizures for at least 6 months! Please stop driving and make arrangements for transportation otherwise.   Please get your liver ultrasound done  Progress Toward Treatment Goals:  Treatment Goal 03/21/2014  Blood pressure deteriorated  Stop smoking smoking the same amount  Prevent falls at goal    Self Care Goals & Plans:  Self Care Goal 07/12/2013  Manage my medications take my medicines as prescribed  Monitor my health keep track of my weight  Eat healthy foods eat more vegetables; eat fruit for snacks and desserts; eat baked foods instead of fried foods; eat smaller portions  Be physically active find an activity I enjoy  Prevent falls wear appropriate shoes  Meeting treatment goals -    No flowsheet data found.   Care Management & Community Referrals:  Referral 02/01/2013  Referrals made for care management support none needed

## 2014-03-22 DIAGNOSIS — K7460 Unspecified cirrhosis of liver: Secondary | ICD-10-CM | POA: Insufficient documentation

## 2014-03-22 DIAGNOSIS — H1130 Conjunctival hemorrhage, unspecified eye: Secondary | ICD-10-CM | POA: Insufficient documentation

## 2014-03-22 DIAGNOSIS — D649 Anemia, unspecified: Secondary | ICD-10-CM | POA: Insufficient documentation

## 2014-03-22 LAB — FERRITIN: Ferritin: 1358 ng/mL — ABNORMAL HIGH (ref 22–322)

## 2014-03-22 LAB — COMPLETE METABOLIC PANEL WITH GFR
ALT: 43 U/L (ref 0–53)
AST: 95 U/L — AB (ref 0–37)
Albumin: 4.3 g/dL (ref 3.5–5.2)
Alkaline Phosphatase: 147 U/L — ABNORMAL HIGH (ref 39–117)
BILIRUBIN TOTAL: 0.5 mg/dL (ref 0.2–1.2)
BUN: 6 mg/dL (ref 6–23)
CALCIUM: 9.7 mg/dL (ref 8.4–10.5)
CO2: 29 meq/L (ref 19–32)
Chloride: 97 mEq/L (ref 96–112)
Creat: 0.75 mg/dL (ref 0.50–1.35)
GFR, Est African American: >89 mL/min
GFR, Est Non African American: >89 mL/min
Glucose, Bld: 116 mg/dL — ABNORMAL HIGH (ref 70–99)
Potassium: 4.8 mEq/L (ref 3.5–5.3)
Sodium: 139 mEq/L (ref 135–145)
TOTAL PROTEIN: 7.7 g/dL (ref 6.0–8.3)

## 2014-03-22 LAB — IRON AND TIBC
%SAT: 92 % — ABNORMAL HIGH (ref 20–55)
Iron: 308 ug/dL — ABNORMAL HIGH (ref 42–165)
TIBC: 336 ug/dL (ref 215–435)
UIBC: 28 ug/dL — ABNORMAL LOW (ref 125–400)

## 2014-03-22 LAB — MAGNESIUM: Magnesium: 0.9 mg/dL — ABNORMAL LOW (ref 1.5–2.5)

## 2014-03-22 LAB — VITAMIN B12: Vitamin B-12: 683 pg/mL (ref 211–911)

## 2014-03-22 NOTE — Assessment & Plan Note (Signed)
Continues to drink alcohol. LFTs trending up. No intention to quit at this time. Hepatin lesion on CT during admission that will need to be further investigated. Hopefully, he will consider cessation

## 2014-03-22 NOTE — Assessment & Plan Note (Signed)
Hb had trended down during hospital admission.   -rechecked cbc--Hb improved, up to 11.7, but does have thrombocytosis -checked iron panel: both iron 308 and ferritin 1358 levels elevated and sat percentage elevated to 92% with TIBC of 336; these values are elevated from prior years when checked and concerning for iron overload. Given that he has liver disease, elevated ferritin can be seen, however, he does have imaging that shows hepatic steatosis and a lesion. I will try to get hematology input to these results and he is already scheduled for further evaluation of the hepatic lesion by ultrasound next week. Will also discuss further with Dr. Daryll Drown.  -follow up opc visit will also be arranged for Friday when he comes for ultrasound and can further assess his anemia and iron studies.

## 2014-03-22 NOTE — Assessment & Plan Note (Signed)
Now resolved.  

## 2014-03-22 NOTE — Assessment & Plan Note (Addendum)
28mm hypodensity found during recent hospital admission, in setting of chronic alcohol dependence and abuse. Unclear what this may be at this time ?malignancy--HCC vs, maybe cyst? I informed the patient of the results of his imaging and the need for further investigation to know what this may be. He voiced understanding.   -ultrasound for further investigation--scheduled for 2/12 -Hepatitis panel negative from recent admission -LFTs trending up, has continued alcohol use

## 2014-03-22 NOTE — Assessment & Plan Note (Signed)
Noted on R eye during admission. Patient reports it is getting better. Denies any eye pain or vision change. He was initially treated with antibiotic drops for concern of conjunctivitis during admission but was discontinued as infection was no longer suspected.   -asked patient to continue to monitor, if not improving or getting worse, or any eye pain or change in vision to notify us immediately and may need to go to the emergency room -may need optho consultation if still present on follow up or not improving

## 2014-03-22 NOTE — Assessment & Plan Note (Signed)
Magnesium level down to 0.9, lower than hospitalization despite being on supplementation. It seems he is taking the 2 tablets as prescribed and confirmed again on the phone tonight. Therefore, I spoke with pharmacy, okay to increase to 3 tablets for now and reassess. Given his work schedule and he does not want to miss any more work, we will try to get him back in to clinic the same day as his liver ultrasound 2/12, right after his ultrasound that is scheduled for 915. I will send the front desk a message to get this scheduled. I suspect, that his Magnesium may still be low (recovering from C diff), thus perhaps he can just get an IV dose while here to help supplement.   He voices understanding on the phone today and was able to confirm the dose change verbally and schedule change for appointment as well.

## 2014-03-22 NOTE — Assessment & Plan Note (Signed)
Still on flagyl, end date 2/9. Reports diarrhea has resolved. Had crampy abdominal pain morning of office visit but since then resolved

## 2014-03-22 NOTE — Assessment & Plan Note (Signed)
BP Readings from Last 3 Encounters:  03/21/14 164/96  03/16/14 142/90  12/23/13 114/83   Lab Results  Component Value Date   NA 139 03/21/2014   K 4.8 03/21/2014   CREATININE 0.75 03/21/2014   Assessment: Blood pressure control: severely elevated Progress toward BP goal:  deteriorated (has not been taking norvasc) Comments: has not been taking amlodipine  Plan: Medications:  continue current medications restart norvasc 10mg  daily Educational resources provided:   Self management tools provided:   Other plans: recheck on follow up visit. Denies headaches or vision change. I have counseled him to notify us right away or go to ED if he starts having headaches or vision change, sudden weakness or stroke like symptoms. He voiced understanding.

## 2014-03-22 NOTE — Assessment & Plan Note (Addendum)
No more seizures since hospital admission, but continues to drink alcohol. Delirium resolved during admission.  1/5th of gin he says lasts him 2 days. He does not wish to stop drinking and refused social work consultation during the visit at this time. I continued to encouraged alcohol cessation safely and consider detox program for assistance. He refused but will hopefully consider it. He does unfortunately continue to drive, which I have again counseled him to NOT drive and he arranged for someone to pick him up from clinic today. He is not to drive for at least 6 months and will need to be reassessed by PCP. He voices understanding and says he will arrange for transportation otherwise.

## 2014-03-22 NOTE — Assessment & Plan Note (Addendum)
No more seizures since admission. Continues on dilantin 300mg  daily.   Encouraged compliance, consider recheck on follow up visit with PCP (level was low on admission) but now says is taking it as prescribed. Hopefully, levels will improve

## 2014-03-22 NOTE — Assessment & Plan Note (Signed)
Resolved. Level improved to 683

## 2014-03-24 ENCOUNTER — Telehealth: Payer: Self-pay | Admitting: *Deleted

## 2014-03-24 LAB — FOLATE RBC
FOLATE, HEMOLYSATE: 295.4 ng/mL
FOLATE, RBC: 874 ng/mL (ref >498)
Hematocrit: 33.8 % — ABNORMAL LOW (ref 37.5–51.0)
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
## 2014-03-24 NOTE — Telephone Encounter (Signed)
Patient can be offered an appointment earlier if he or his mother wishes but he said he doesn't want to take more time away from work. If he gives permission we can discuss more with her but its a good idea for her to come to the next office visit, and sister as well if he wishes. We do not know much about the lesion on his liver yet. The ultrasound will help. Please encourage her to have patient seen in the emergency room if needed if opc is closed or if he has hurt himself. Hopefully he can control his drinking, we discussed alcohol cessation during office visit as well but he was not interested.

## 2014-03-25 NOTE — Progress Notes (Signed)
Internal Medicine Clinic Attending  Case discussed with Dr. Qureshi soon after the resident saw the patient.  We reviewed the resident's history and exam and pertinent patient test results.  I agree with the assessment, diagnosis, and plan of care documented in the resident's note. 

## 2014-03-25 NOTE — Addendum Note (Signed)
Addended by: Gilles Chiquito B on: 03/25/2014 05:12 PM   Modules accepted: Level of Service

## 2014-03-28 ENCOUNTER — Ambulatory Visit (INDEPENDENT_AMBULATORY_CARE_PROVIDER_SITE_OTHER): Payer: Self-pay | Admitting: Internal Medicine

## 2014-03-28 ENCOUNTER — Encounter (HOSPITAL_COMMUNITY): Payer: Self-pay | Admitting: Cardiology

## 2014-03-28 ENCOUNTER — Telehealth: Payer: Self-pay | Admitting: Internal Medicine

## 2014-03-28 ENCOUNTER — Encounter (HOSPITAL_COMMUNITY): Payer: Self-pay | Admitting: *Deleted

## 2014-03-28 ENCOUNTER — Encounter: Payer: Self-pay | Admitting: Internal Medicine

## 2014-03-28 ENCOUNTER — Emergency Department (HOSPITAL_COMMUNITY)
Admission: EM | Admit: 2014-03-28 | Discharge: 2014-03-28 | Discharge status: Against Medical Advice | Payer: Self-pay | Attending: Emergency Medicine | Admitting: Emergency Medicine

## 2014-03-28 ENCOUNTER — Observation Stay (HOSPITAL_COMMUNITY): Payer: Self-pay

## 2014-03-28 ENCOUNTER — Observation Stay (HOSPITAL_COMMUNITY)
Admission: AD | Admit: 2014-03-28 | Discharge: 2014-03-30 | Disposition: A | Discharge status: Home / Self Care | Payer: Self-pay | Source: Ambulatory Visit | Attending: Internal Medicine | Admitting: Internal Medicine

## 2014-03-28 ENCOUNTER — Ambulatory Visit (HOSPITAL_COMMUNITY)
Admission: RE | Admit: 2014-03-28 | Discharge: 2014-03-28 | Disposition: A | Discharge status: Home / Self Care | Payer: Self-pay | Source: Ambulatory Visit | Attending: Internal Medicine | Admitting: Internal Medicine

## 2014-03-28 VITALS — BP 141/116 | HR 137 | Temp 99.2°F | Ht 65.0 in | Wt 110.0 lb

## 2014-03-28 DIAGNOSIS — E876 Hypokalemia: Secondary | ICD-10-CM | POA: Insufficient documentation

## 2014-03-28 DIAGNOSIS — Z9114 Patient's other noncompliance with medication regimen: Secondary | ICD-10-CM | POA: Insufficient documentation

## 2014-03-28 DIAGNOSIS — A0472 Enterocolitis due to Clostridium difficile, not specified as recurrent: Secondary | ICD-10-CM

## 2014-03-28 DIAGNOSIS — Z720 Tobacco use: Secondary | ICD-10-CM | POA: Diagnosis present

## 2014-03-28 DIAGNOSIS — D638 Anemia in other chronic diseases classified elsewhere: Secondary | ICD-10-CM | POA: Insufficient documentation

## 2014-03-28 DIAGNOSIS — IMO0001 Reserved for inherently not codable concepts without codable children: Secondary | ICD-10-CM

## 2014-03-28 DIAGNOSIS — R778 Other specified abnormalities of plasma proteins: Secondary | ICD-10-CM | POA: Diagnosis present

## 2014-03-28 DIAGNOSIS — R000 Tachycardia, unspecified: Secondary | ICD-10-CM | POA: Diagnosis present

## 2014-03-28 DIAGNOSIS — A047 Enterocolitis due to Clostridium difficile: Secondary | ICD-10-CM

## 2014-03-28 DIAGNOSIS — E46 Unspecified protein-calorie malnutrition: Secondary | ICD-10-CM | POA: Diagnosis present

## 2014-03-28 DIAGNOSIS — F10239 Alcohol dependence with withdrawal, unspecified: Secondary | ICD-10-CM

## 2014-03-28 DIAGNOSIS — F1029 Alcohol dependence with unspecified alcohol-induced disorder: Secondary | ICD-10-CM

## 2014-03-28 DIAGNOSIS — E781 Pure hyperglyceridemia: Secondary | ICD-10-CM | POA: Diagnosis present

## 2014-03-28 DIAGNOSIS — I10 Essential (primary) hypertension: Secondary | ICD-10-CM

## 2014-03-28 DIAGNOSIS — F10939 Alcohol use, unspecified with withdrawal, unspecified: Secondary | ICD-10-CM

## 2014-03-28 DIAGNOSIS — E785 Hyperlipidemia, unspecified: Secondary | ICD-10-CM | POA: Insufficient documentation

## 2014-03-28 DIAGNOSIS — K7689 Other specified diseases of liver: Secondary | ICD-10-CM

## 2014-03-28 DIAGNOSIS — F1020 Alcohol dependence, uncomplicated: Secondary | ICD-10-CM | POA: Insufficient documentation

## 2014-03-28 DIAGNOSIS — D72829 Elevated white blood cell count, unspecified: Secondary | ICD-10-CM | POA: Diagnosis present

## 2014-03-28 DIAGNOSIS — R7989 Other specified abnormal findings of blood chemistry: Secondary | ICD-10-CM | POA: Diagnosis present

## 2014-03-28 DIAGNOSIS — K111 Hypertrophy of salivary gland: Secondary | ICD-10-CM | POA: Insufficient documentation

## 2014-03-28 DIAGNOSIS — K769 Liver disease, unspecified: Secondary | ICD-10-CM

## 2014-03-28 DIAGNOSIS — R569 Unspecified convulsions: Secondary | ICD-10-CM

## 2014-03-28 DIAGNOSIS — D649 Anemia, unspecified: Secondary | ICD-10-CM | POA: Diagnosis present

## 2014-03-28 LAB — COMPREHENSIVE METABOLIC PANEL
ALK PHOS: 140 U/L — AB (ref 39–117)
ALT: 50 U/L (ref 0–53)
AST: 118 U/L — AB (ref 0–37)
Albumin: 4 g/dL (ref 3.5–5.2)
Anion gap: 16 — ABNORMAL HIGH (ref 5–15)
BILIRUBIN TOTAL: 1.4 mg/dL — AB (ref 0.3–1.2)
BUN: <5 mg/dL — ABNORMAL LOW (ref 6–23)
CHLORIDE: 88 mmol/L — AB (ref 96–112)
CO2: 28 mmol/L (ref 19–32)
CREATININE: 0.82 mg/dL (ref 0.50–1.35)
Calcium: 9.3 mg/dL (ref 8.4–10.5)
GFR calc Af Amer: >90 mL/min (ref >90)
Glucose, Bld: 84 mg/dL (ref 70–99)
POTASSIUM: 3.5 mmol/L (ref 3.5–5.1)
Sodium: 132 mmol/L — ABNORMAL LOW (ref 135–145)
Total Protein: 7.6 g/dL (ref 6.0–8.3)

## 2014-03-28 LAB — CBC
HCT: 35.5 % — ABNORMAL LOW (ref 39.0–52.0)
Hemoglobin: 12.3 g/dL — ABNORMAL LOW (ref 13.0–17.0)
MCH: 34.4 pg — ABNORMAL HIGH (ref 26.0–34.0)
MCHC: 34.6 g/dL (ref 30.0–36.0)
MCV: 99.2 fL (ref 78.0–100.0)
PLATELETS: 272 10*3/uL (ref 150–400)
RBC: 3.58 MIL/uL — ABNORMAL LOW (ref 4.22–5.81)
RDW: 13.6 % (ref 11.5–15.5)
WBC: 14.4 10*3/uL — AB (ref 4.0–10.5)

## 2014-03-28 LAB — PROTIME-INR
INR: 0.89 (ref 0.00–1.49)
Prothrombin Time: 12.1 seconds (ref 11.6–15.2)

## 2014-03-28 LAB — CBC WITH DIFFERENTIAL/PLATELET
BASOS ABS: 0 10*3/uL (ref 0.0–0.1)
Basophils Relative: 0 % (ref 0–1)
Eosinophils Absolute: 0 10*3/uL (ref 0.0–0.7)
Eosinophils Relative: 0 % (ref 0–5)
HEMATOCRIT: 34.5 % — AB (ref 39.0–52.0)
HEMOGLOBIN: 12.1 g/dL — AB (ref 13.0–17.0)
LYMPHS PCT: 10 % — AB (ref 12–46)
Lymphs Abs: 1.7 10*3/uL (ref 0.7–4.0)
MCH: 33.9 pg (ref 26.0–34.0)
MCHC: 35.1 g/dL (ref 30.0–36.0)
MCV: 96.6 fL (ref 78.0–100.0)
MONO ABS: 0.7 10*3/uL (ref 0.1–1.0)
Monocytes Relative: 4 % (ref 3–12)
NEUTROS ABS: 14.7 10*3/uL — AB (ref 1.7–7.7)
Neutrophils Relative %: 86 % — ABNORMAL HIGH (ref 43–77)
Platelets: 247 10*3/uL (ref 150–400)
RBC: 3.57 MIL/uL — ABNORMAL LOW (ref 4.22–5.81)
RDW: 13.5 % (ref 11.5–15.5)
WBC: 17.1 10*3/uL — ABNORMAL HIGH (ref 4.0–10.5)

## 2014-03-28 LAB — LIPID PANEL
Cholesterol: 395 mg/dL — ABNORMAL HIGH (ref 0–200)
HDL: 61 mg/dL (ref >39)
LDL Cholesterol: UNDETERMINED mg/dL (ref 0–99)
Total CHOL/HDL Ratio: 6.5 RATIO
Triglycerides: 676 mg/dL — ABNORMAL HIGH (ref <150)
VLDL: UNDETERMINED mg/dL (ref 0–40)

## 2014-03-28 LAB — MAGNESIUM
MAGNESIUM: 1 mg/dL — AB (ref 1.5–2.5)
MAGNESIUM: 1.3 mg/dL — AB (ref 1.5–2.5)

## 2014-03-28 LAB — BASIC METABOLIC PANEL
Anion gap: 22 — ABNORMAL HIGH (ref 5–15)
BUN: <5 mg/dL — ABNORMAL LOW (ref 6–23)
CHLORIDE: 89 mmol/L — AB (ref 96–112)
CO2: 23 mmol/L (ref 19–32)
Calcium: 9.4 mg/dL (ref 8.4–10.5)
Creatinine, Ser: 0.83 mg/dL (ref 0.50–1.35)
GFR calc Af Amer: >90 mL/min (ref >90)
GFR calc non Af Amer: >90 mL/min (ref >90)
GLUCOSE: 83 mg/dL (ref 70–99)
Potassium: 3.5 mmol/L (ref 3.5–5.1)
Sodium: 134 mmol/L — ABNORMAL LOW (ref 135–145)

## 2014-03-28 LAB — APTT: APTT: 29 s (ref 24–37)

## 2014-03-28 LAB — ETHANOL

## 2014-03-28 LAB — PHENYTOIN LEVEL, TOTAL: PHENYTOIN LVL: 3.1 ug/mL — AB (ref 10.0–20.0)

## 2014-03-28 LAB — TROPONIN I: TROPONIN I: 0.04 ng/mL — AB (ref <0.031)

## 2014-03-28 LAB — PHOSPHORUS: PHOSPHORUS: 3.6 mg/dL (ref 2.3–4.6)

## 2014-03-28 MED ORDER — THIAMINE HCL 100 MG/ML IJ SOLN
Freq: Once | INTRAVENOUS | Status: AC
  Administered 2014-03-28: 18:00:00 via INTRAVENOUS
  Filled 2014-03-28: qty 1000

## 2014-03-28 MED ORDER — VITAMIN B-1 100 MG PO TABS
100.0000 mg | ORAL_TABLET | Freq: Every day | ORAL | Status: DC
  Administered 2014-03-28 – 2014-03-30 (×3): 100 mg via ORAL
  Filled 2014-03-28 (×3): qty 1

## 2014-03-28 MED ORDER — THIAMINE HCL 100 MG/ML IJ SOLN
100.0000 mg | Freq: Every day | INTRAMUSCULAR | Status: DC
  Filled 2014-03-28 (×2): qty 1

## 2014-03-28 MED ORDER — PHENYTOIN SODIUM EXTENDED 100 MG PO CAPS
200.0000 mg | ORAL_CAPSULE | Freq: Two times a day (BID) | ORAL | Status: DC
  Filled 2014-03-28 (×2): qty 2

## 2014-03-28 MED ORDER — SODIUM CHLORIDE 0.9 % IV SOLN
INTRAVENOUS | Status: DC
  Administered 2014-03-29: 100 mL via INTRAVENOUS

## 2014-03-28 MED ORDER — AMLODIPINE BESYLATE 10 MG PO TABS
10.0000 mg | ORAL_TABLET | Freq: Every day | ORAL | Status: DC
  Administered 2014-03-29 – 2014-03-30 (×2): 10 mg via ORAL
  Filled 2014-03-28 (×2): qty 1

## 2014-03-28 MED ORDER — METOPROLOL TARTRATE 1 MG/ML IV SOLN
5.0000 mg | INTRAVENOUS | Status: DC | PRN
  Administered 2014-03-29: 5 mg via INTRAVENOUS
  Filled 2014-03-28: qty 5

## 2014-03-28 MED ORDER — MAGNESIUM SULFATE 2 GM/50ML IV SOLN
2.0000 g | Freq: Once | INTRAVENOUS | Status: AC
  Administered 2014-03-29: 2 g via INTRAVENOUS
  Filled 2014-03-28: qty 50

## 2014-03-28 MED ORDER — METOPROLOL TARTRATE 1 MG/ML IV SOLN: 5.0000 mg | INTRAVENOUS | Status: DC | PRN

## 2014-03-28 MED ORDER — TETRAHYDROZOLINE HCL 0.05 % OP SOLN: 1.0000 [drp] | Freq: Every day | OPHTHALMIC | Status: DC | PRN

## 2014-03-28 MED ORDER — ADULT MULTIVITAMIN W/MINERALS CH
1.0000 | ORAL_TABLET | Freq: Every day | ORAL | Status: DC
  Administered 2014-03-29 – 2014-03-30 (×2): 1 via ORAL
  Filled 2014-03-28 (×2): qty 1

## 2014-03-28 MED ORDER — PHENYTOIN SODIUM EXTENDED 100 MG PO CAPS
400.0000 mg | ORAL_CAPSULE | Freq: Once | ORAL | Status: AC
  Administered 2014-03-29: 400 mg via ORAL
  Filled 2014-03-28: qty 4

## 2014-03-28 MED ORDER — FOLIC ACID 1 MG PO TABS
1.0000 mg | ORAL_TABLET | Freq: Every day | ORAL | Status: DC
  Administered 2014-03-28 – 2014-03-30 (×3): 1 mg via ORAL
  Filled 2014-03-28 (×4): qty 1

## 2014-03-28 MED ORDER — METOPROLOL TARTRATE 1 MG/ML IV SOLN
10.0000 mg | Freq: Once | INTRAVENOUS | Status: AC
  Administered 2014-03-28: 10 mg via INTRAVENOUS

## 2014-03-28 MED ORDER — LORAZEPAM 1 MG PO TABS: 1.0000 mg | ORAL_TABLET | Freq: Four times a day (QID) | ORAL | Status: DC | PRN

## 2014-03-28 MED ORDER — ENOXAPARIN SODIUM 40 MG/0.4ML ~~LOC~~ SOLN
40.0000 mg | SUBCUTANEOUS | Status: DC
  Administered 2014-03-29 (×2): 40 mg via SUBCUTANEOUS
  Filled 2014-03-28 (×3): qty 0.4

## 2014-03-28 MED ORDER — PHENYTOIN SODIUM EXTENDED 100 MG PO CAPS
300.0000 mg | ORAL_CAPSULE | Freq: Every day | ORAL | Status: DC
  Filled 2014-03-28: qty 3

## 2014-03-28 MED ORDER — THIAMINE HCL 100 MG/ML IJ SOLN: Freq: Once | INTRAVENOUS | Status: DC

## 2014-03-28 MED ORDER — LORAZEPAM 2 MG/ML IJ SOLN
1.0000 mg | Freq: Four times a day (QID) | INTRAMUSCULAR | Status: DC | PRN
Start: 2014-03-28 — End: 2014-03-30
  Administered 2014-03-28: 1 mg via INTRAVENOUS
  Filled 2014-03-28: qty 1

## 2014-03-28 NOTE — Assessment & Plan Note (Addendum)
Tremulous, tachycardic, diaphoretic, and hypertensive today. Last drink around 7pm today and then some liquor this afternoon. He denies any recent seizures. No agitation. Responding to questions appropriately.   Admit to IMTS for withdrawal

## 2014-03-28 NOTE — ED Notes (Signed)
Pt reports going to pcp today, had US done of abd. Was called and told to come here due to results and HR 140. ekg done at triage and no acute distress noted at this time.

## 2014-03-28 NOTE — Progress Notes (Signed)
Subjective:   Patient ID: Joseph Underwood male   DOB: 08-Oct-1965 49 y.o.   MRN: [id number]  HPI: Joseph Underwood is a 49 y.o. male with alcohol abuse and HTN and other PMH as listed below presenting to opc today for routine follow up visit.   Hepatic lesion--had u/s this morning. Will follow up results. Apparently has been anxious since our last discussion and today. He agreed to let family and sister in the room during visit as well.   HTN--remains elevated. Taking norvasc but not daily.   Hypomagnesemia--increased mag chloride to 3 tablets on 2/6 but has not been taking as prescribed.   Last time he took any of his medications was apparently Wednesday per patient.   Cdiff--still has ~6.5 days of flagyl pills left in bottle. Diarrhea and abdominal pain has resolved.   Still drinking alcohol, last drink was around 7pm last night, 1/2pint of beer. Sister asked if rehab/detox are options if he agrees. Mother says he is unstable when he walks at times. Tremulous today. He did vomit this morning.   Tachycardia--HR in 140s earlier today. Sinus tachycardia in the past.   Past Medical History  Diagnosis Date  . Alcohol abuse   . Hiccups   . HTN (hypertension)   . Alcohol related seizure    Current Outpatient Prescriptions  Medication Sig Dispense Refill  . amLODipine (NORVASC) 10 MG tablet Take 1 tablet (10 mg total) by mouth daily. 30 tablet 2  . folic acid (FOLVITE) 1 MG tablet Take 1 tablet (1 mg total) by mouth daily. (Patient not taking: Reported on 03/21/2014) 30 tablet 6  . magnesium chloride (SLOW-MAG) 64 MG TBEC SR tablet Take 2 tablets (128 mg total) by mouth daily. 60 tablet 0  . metroNIDAZOLE (FLAGYL) 500 MG tablet Take 1 tablet (500 mg total) by mouth every 8 (eight) hours. 28 tablet 0  . ondansetron (ZOFRAN-ODT) 8 MG disintegrating tablet Take 1 tablet (8 mg total) by mouth once. (Patient not taking: Reported on 03/21/2014) 20 tablet 0  . phenytoin (DILANTIN) 100 MG ER capsule    2  . potassium chloride 20 MEQ TBCR Take 20 mEq by mouth daily. 30 tablet 2   No current facility-administered medications for this visit.   Family History  Problem Relation Age of Onset  . Hypertension Mother    History   Social History  . Marital Status: Single    Spouse Name: N/A  . Number of Children: N/A  . Years of Education: N/A   Social History Main Topics  . Smoking status: Current Every Day Smoker -- 0.50 packs/day for 37 years    Types: Cigarettes  . Smokeless tobacco: Never Used  . Alcohol Use: 52.8 oz/week    14 Cans of beer, 74 Shots of liquor per week     Comment: 12/16/2013 "~ 1 pint of liquor per day"  . Drug Use: No  . Sexual Activity: No   Other Topics Concern  . None   Social History Narrative   Review of Systems:  Constitutional:  Denies fever, chills. Runny nose.   Respiratory:  Denies SOB  Cardiovascular:  Denies chest pain  Gastrointestinal:  Denies nausea or abdominal pain. +vomiting this AM.   Musculoskeletal:  Mother endorses falls at times  Skin:  Dry  Neurological:  Hx of seizures   Objective:  Physical Exam: Filed Vitals:   03/28/14 1027 03/28/14 1115 03/28/14 1550  BP: 141/107 153/97 141/116  Pulse: 140 143 137  Temp: 99.2 F (37.3 C)    Height: 5\' 5"  (1.651 m)    Weight: 110 lb (49.896 kg)    SpO2: 100%  97%   Vitals reviewed. General: sitting on examination table, appears anxious at times, diaphoretic HEENT: wears glasses, EOMI Cardiac: tachycardia Pulm: clear to auscultation bilaterally Abd: soft, nontender, BS present  Ext: thin extremities with dry skin, tremor of hands Neuro: alert and oriented, responding appropriately to questions, following commands, strength equal in b/l extremities  Assessment & Plan:  Discussed with Dr. Beryle Beams and Dr. Dareen Piano

## 2014-03-28 NOTE — Assessment & Plan Note (Addendum)
Patient was noted to have HR to 140s on vital signs review but had already left the clinic. I called and had them return to opc. EKG was done and reviewed by myself and Dr. Dareen Piano. Sustained sinus tachycardia 141bpm, TWI in inferolateral leads that are new from prior tracings. He is also tremulous and diaphoretic and hypertensive. He has chronic hypomagnesemia and hypokalemia. Prolonged qtc has improved from before to 431 from 590 before. He denies chest pain abdominal pain or shortness of breath. He did vomit this morning. Tachycardia could be due to alcohol withdrawal and/or dehydration (has not eaten and drank very little water since yesterday) however needs to be monitored as it is certainly more elevated than before.   We will admit Joseph Underwood to IMTS today for alcohol withdrawal, tachycardia, and possible ACS--discussed with inpatient admitting team, Dr. Naaman Plummer senior resident.

## 2014-03-28 NOTE — Assessment & Plan Note (Signed)
U/s done today. ?hemangioma given no other hx of known malignancy. Follow up recommended in 6 months. Results forwarded to pcp as well

## 2014-03-28 NOTE — Assessment & Plan Note (Addendum)
Diarrhea and abdominal pain has resolved but has missed doses of flagyl ~6 days left in bottle  Recommend recheck pcr in patient, may need to continue flagyl

## 2014-03-28 NOTE — Patient Instructions (Signed)
General Instructions:  Thank you for bringing your medicines today. This helps [place] keep you safe from mistakes.  Please take your magnesium tablets three times a day  Please finish your metronidazole course of antibiotics, you only have 6 days left  Please try to take all your medications every day! Do not skip doses if possible  Your blood pressure is better but if you took your medicine every day it can get better  Follow up with your pcp in ~2 weeks if possible based on your work schedule  Progress Toward Treatment Goals:  Treatment Goal 03/28/2014  Blood pressure improved  Stop smoking -  Prevent falls -    Self Care Goals & Plans:  Self Care Goal 03/28/2014  Manage my medications take my medicines as prescribed; bring my medications to every visit; refill my medications on time; follow the sick day instructions if I am sick  Monitor my health -  Eat healthy foods eat more vegetables; eat fruit for snacks and desserts; eat baked foods instead of fried foods; eat smaller portions  Be physically active find an activity I enjoy  Prevent falls -  Meeting treatment goals -    No flowsheet data found.   Care Management & Community Referrals:  Referral 02/01/2013  Referrals made for care management support none needed

## 2014-03-28 NOTE — Assessment & Plan Note (Addendum)
Joseph Underwood remains low at 1.0 but he has not been taking supplementation regularly. Was increased to 3 tablets of the Joseph Underwood on 2/6 but says he never did that and thinks Wednesday may have been the last time he took any.   -continued to encourage compliance with medications--i suspect this will remain an issue if he does not take his medications properly. He has been counseled extensively again today.  -will replace during admission -check cmet

## 2014-03-28 NOTE — Progress Notes (Signed)
Patient's HR is continuing to remain elevated ranging from 130's-150's; IV ativan given with no relief; IM notified for orders.  Rowe Pavy, RN

## 2014-03-28 NOTE — Progress Notes (Signed)
Medicine attending: Medical history, presenting problems, physical findings, and medications, reviewed with Dr Samaya Quereshi and I concur with her evaluation and management plan. 

## 2014-03-28 NOTE — Progress Notes (Signed)
The patient Joseph Underwood has current phenytoin level of  PHENYTOIN LVL  Date Value Ref Range Status  03/28/2014 3.1* 10.0 - 20.0 ug/mL Final  03/13/2014 8.5* 10.0 - 20.0 ug/mL Final    Other pertinent lab values include:  CREAT  Date Value Ref Range Status  03/21/2014 0.75 0.50 - 1.35 mg/dL Final   CREATININE, SER  Date Value Ref Range Status  03/28/2014 0.82 0.50 - 1.35 mg/dL Final   Albumin 4 g/dL  The patient will be loaded with 400 mg of phenytoin by Oral route. Maintenance dose will be increased by 100 mg/day.  Will continue to monitor phenytoin levels, albumin and creatinine as needed for dosing adjustments.  Doses are based on the following calculations:  Loading dose = 0.7 (desired level - current level) weight  Increase for maintenance dose:  Increase by 100 mg per day for level less than 7; Increase by 50 mg per day for level between 7-12; Level of 12 and greater: No change. Patients with levels greater than 18 will be assessed for need for a free phenytoin level and for side effects (i.e. drowsiness, ataxia, lethargy, nausea, vomiting) and doses lowered as appropriate.  Wynona Neat, PharmD, BCPS

## 2014-03-28 NOTE — Assessment & Plan Note (Addendum)
Continues to drink alcohol. Not ready to quit. Tachycardic (question accuracy given that his hand/arms shake during measurement) and tremulous today but not confused. Last drink was last night and briefly had liquor this afternoon.  Family inquired about detox options. Will have social work reach out to them Encouraged to stay hydrated with water, he did not drink or eat anything today for ultrasound. Will hydrate during admission.

## 2014-03-28 NOTE — Assessment & Plan Note (Addendum)
BP Readings from Last 3 Encounters:  03/28/14 140/94  03/28/14 141/116  03/21/14 164/96    Lab Results  Component Value Date   NA 139 03/21/2014   K 4.8 03/21/2014   CREATININE 0.75 03/21/2014    Assessment: Blood pressure control: moderately elevated Progress toward BP goal:  improved briefly then back up again Comments: brought bottle of amlodipine with him, 5 pills missing. That means he has been taking it but thinks wed was probably last time. Elevated again today. Alcohol withdrawal could be contributing.   Plan: Medications:  continue current medications continue norvasc 10mg  Educational resources provided: brochure, handout, video Self management tools provided:   Other plans: encouraged compliance daily. Follow up on next visit and during hospitalization. If still elevated, ask if compliant, may need second agent as well

## 2014-03-28 NOTE — H&P (Signed)
Date: 03/28/2014               Patient Name:  Joseph Underwood MRN: [id number]  DOB: 1965-06-08 Age / Sex: 49 y.o., male   PCP: Drucilla Schmidt, MD              Medical Service: Internal Medicine Teaching Service              Attending Physician: Dr. Bartholomew Crews, MD    First Contact: Dr. Trudee Kuster Pager: [phone]  Second Contact: Dr. Naaman Plummer Pager: [phone]            After Hours (After 5p/  First Contact Pager: [phone]  weekends / holidays): Second Contact Pager: [phone]   Chief Complaint:  Tachycardia  History of Present Illness: Joseph Underwood is a 49 year old man with history of alcohol abuse, seizure disorder, hypomagnesemia, and HTN presenting with tachycardia.  He presented to [facility] clinic today for routine follow up after having a liver ultrasound this morning to follow up a liver lesion, which was consistent with a hemangioma. At that visit, he was noted to be tremulous, tachycardic, and diaphoretic.  He was recently hospitalized and discharged on 03/15/2014 for seizures related to alcohol withdrawal, and required Precedex drip.  He reports trying to cut back on his alcohol over the last week. He previously had been drinking 1 pint of gin per day for the last 20 years, and he did cut back to one half pint of gin over the last week. He reports last drinking yesterday around 7 PM when he completed a half pint of gin. Today, he has had very little to drink due to his ultrasound procedure this morning and clinic appointment this afternoon. He says that following his ultrasound, he returned home and had one sip of gin, but he has not drank anything other than that. He also reports poor by mouth intake over the last day. He was recently treated for C. difficile with with Flagyl.  He reports resolution of his diarrhea, but he has missed several doses of his Flagyl and has 6 days worth of medicine left in his bottle. He denies fevers, chills, chest pain, anxiety, palpitations, or  hallucinations.  Review of Systems: Review of Systems  Constitutional: Negative for fever, chills and weight loss.  HENT: Negative for congestion and sore throat.   Eyes: Negative for blurred vision.  Respiratory: Negative for cough and shortness of breath.   Cardiovascular: Negative for chest pain, palpitations and leg swelling.  Gastrointestinal: Negative for nausea, vomiting, diarrhea and constipation.  Genitourinary: Negative for dysuria.  Musculoskeletal: Negative for myalgias, back pain and joint pain.  Skin: Negative for rash.  Neurological: Negative for dizziness, focal weakness, weakness and headaches.  Psychiatric/Behavioral: Negative for hallucinations. The patient is not nervous/anxious.     Meds: Medications Prior to Admission  Medication Sig Dispense Refill  . amLODipine (NORVASC) 10 MG tablet Take 1 tablet (10 mg total) by mouth daily. 30 tablet 2  . folic acid (FOLVITE) 1 MG tablet Take 1 tablet (1 mg total) by mouth daily. 30 tablet 6  . magnesium chloride (SLOW-MAG) 64 MG TBEC SR tablet Take 2 tablets (128 mg total) by mouth daily. 60 tablet 0  . metroNIDAZOLE (FLAGYL) 500 MG tablet Take 1 tablet (500 mg total) by mouth every 8 (eight) hours. 28 tablet 0  . ondansetron (ZOFRAN-ODT) 8 MG disintegrating tablet Take 1 tablet (8 mg total) by mouth once. (Patient not taking: Reported on  03/21/2014) 20 tablet 0  . phenytoin (DILANTIN) 100 MG ER capsule Take 300 mg by mouth at bedtime.   2  . potassium chloride 20 MEQ TBCR Take 20 mEq by mouth daily. 30 tablet 2   Current Facility-Administered Medications  Medication Dose Route Frequency Provider Last Rate Last Dose  . folic acid (FOLVITE) tablet 1 mg  1 mg Oral Daily Marjan Rabbani, MD      . LORazepam (ATIVAN) tablet 1 mg  1 mg Oral Q6H PRN Juluis Mire, MD       Or  . LORazepam (ATIVAN) injection 1 mg  1 mg Intravenous Q6H PRN Juluis Mire, MD      . Derrill Memo ON 03/29/2014] multivitamin with minerals tablet 1 tablet  1  tablet Oral Daily Marjan Rabbani, MD      . sodium chloride 0.9 % 1,000 mL with thiamine 209 mg, folic acid 1 mg, multivitamins adult 10 mL, magnesium sulfate 2 g infusion   Intravenous Once Wilber Oliphant, MD      . thiamine (VITAMIN B-1) tablet 100 mg  100 mg Oral Daily Marjan Rabbani, MD       Or  . thiamine (B-1) injection 100 mg  100 mg Intravenous Daily Juluis Mire, MD        Allergies: Allergies as of 03/28/2014  . (No Known Allergies)   Past Medical History  Diagnosis Date  . Alcohol abuse   . Hiccups   . HTN (hypertension)   . Alcohol related seizure    Past Surgical History  Procedure Laterality Date  . No past surgeries     Family History  Problem Relation Age of Onset  . Hypertension Mother    History   Social History  . Marital Status: Single    Spouse Name: N/A  . Number of Children: N/A  . Years of Education: N/A   Occupational History  . Not on file.   Social History Main Topics  . Smoking status: Current Every Day Smoker -- 0.50 packs/day for 37 years    Types: Cigarettes  . Smokeless tobacco: Never Used  . Alcohol Use: 52.8 oz/week    14 Cans of beer, 74 Shots of liquor per week     Comment: 12/16/2013 "~ 1 pint of liquor per day"  . Drug Use: No  . Sexual Activity: No   Other Topics Concern  . Not on file   Social History Narrative    Physical Exam: Filed Vitals:   03/28/14 1735  BP: 160/108  Pulse: 132  Temp: 99.5 F (37.5 C)  Resp: 19   Physical Exam  Constitutional: He is oriented to person, place, and time. No distress.  Very thin appearing. Tremulous.  HENT:  Head: Normocephalic and atraumatic.  Eyes: Conjunctivae and EOM are normal. Pupils are equal, round, and reactive to light. No scleral icterus.  Cardiovascular: Regular rhythm.  Exam reveals no gallop and no friction rub.   No murmur heard. Tachycardic.  Pulmonary/Chest: Effort normal and breath sounds normal. No respiratory distress. He has no wheezes.    Abdominal: Soft. Bowel sounds are normal. He exhibits no distension. There is no tenderness.  Musculoskeletal: Normal range of motion. He exhibits no edema or tenderness.  Neurological: He is alert and oriented to person, place, and time. No cranial nerve deficit. He exhibits normal muscle tone.  Skin: Skin is warm. No rash noted. He is diaphoretic.  Very dry and scratchy skin.    Lab results: Basic Metabolic Panel:  Recent Labs  03/28/14 1031 03/28/14 1526  NA  --  134*  K  --  3.5  CL  --  89*  CO2  --  23  GLUCOSE  --  83  BUN  --  <5*  CREATININE  --  0.83  CALCIUM  --  9.4  MG 1.0*  --    CBC:  Recent Labs  03/28/14 1526  WBC 14.4*  HGB 12.3*  HCT 35.5*  MCV 99.2  PLT 272    Imaging results:  US Abdomen Limited  03/28/2014   CLINICAL DATA:  Hepatic lesion.  EXAM: US ABDOMEN LIMITED - RIGHT UPPER QUADRANT  COMPARISON:  CT scan in March 13, 2014.  FINDINGS: Gallbladder:  No gallstones or wall thickening visualized. No sonographic Murphy sign noted. Minimal amount of sludge is present. No pericholecystic fluid is noted.  Common bile duct:  Diameter: 4 mm which is within normal limits.  Liver:  8 mm hyperechoic focus is noted in the left hepatic lobe corresponding in location to abnormality seen on CT scan. Within normal limits in parenchymal echogenicity.  IMPRESSION: Minimal gallbladder sludge is present.  8 mm hyperechoic focus is noted in left hepatic lobe corresponding to abnormality seen on CT scan. In the absence of any history of malignancy, this most likely represents hemangioma. Follow-up ultrasound in 6 months is recommended to ensure stability. If the patient does have a history of malignancy, then metastatic disease cannot be excluded and further evaluation with MRI would be recommended.   Electronically Signed   By: Marijo Conception, M.D.   On: 03/28/2014 11:35    Other results: EKG: Sinus tachycardia, new T-wave inversion in V3, V4, and aVF.  Assessment  & Plan by Problem: Active Problems:   Seizures   Essential hypertension, benign   Hypertriglyceridemia   Tobacco dependence   Protein-calorie malnutrition   Hypomagnesemia   Alcohol withdrawal   Alcohol abuse   Normocytic anemia   Tachycardia with 141 - 160 beats per minute   Leukocytosis   Elevated troponin   History of Clostridium difficile   #Alcohol withdrawal Symptoms are most consistent with alcohol withdrawal, and the patient has been trying to cut back on his drinking. He has not able to drink much today given his procedures and being away from home. He also appears dehydrated, which could be contributing. His troponin was mildly elevated initially. However, he denies any chest pain or shortness of breath. He does have an elevated white blood cell count as well. We'll rule out any underlying infection or ACS. TSH was normal on 03/10/2014. He has a history of seizure disorder maintained on phenytoin. He's had seizures when withdrawing from alcohol in the past as well. We'll monitor him closely. He had a normal echocardiogram in June 2013. -Admit to telemetry. -CIWA protocol. -Metoprolol 10 mg IV, then 5 mg as needed for heart rate greater than 110. -Continue home phenytoin 300 mg daily and check level. -Normal saline 100 mL per hour for 10 hours. -Banana bag. -Trend troponin and EKG.  -Check UDS and urinalysis. -Check ethanol level. -Multivitamin, thiamine, folate. -Heart diet.  #Hypomagnesemia History of hypomagnesemia. He has been noncompliant with his medications and his magnesium is low 1.0. This could be contributing to his tachycardia. -Magnesium sulfate 2 g in banana bag. -Recheck magnesium. -Check phosphorus.  #History of C. difficile Symptoms have resolved, but he did not complete treatment. He still has an elevated white blood cell count.  -Check C. difficile  PCR.  #Hypertension Blood pressure elevated on admission. This will improve with Ativan  administration. -Continue home amlodipine 10 mg daily.  Dispo: Disposition is deferred at this time, awaiting improvement of current medical problems. Anticipated discharge in approximately 1-3 day(s).   The patient does have a current PCP Drucilla Schmidt, MD), therefore will be require OPC follow-up after discharge.   The patient does have transportation limitations that hinder transportation to clinic appointments.   Signed:  Arman Filter, MD, PhD PGY-1 Internal Medicine Teaching Service Pager: [phone] 03/28/2014, 6:04 PM

## 2014-03-29 DIAGNOSIS — R569 Unspecified convulsions: Secondary | ICD-10-CM

## 2014-03-29 DIAGNOSIS — E785 Hyperlipidemia, unspecified: Secondary | ICD-10-CM

## 2014-03-29 DIAGNOSIS — R000 Tachycardia, unspecified: Secondary | ICD-10-CM

## 2014-03-29 DIAGNOSIS — D638 Anemia in other chronic diseases classified elsewhere: Secondary | ICD-10-CM

## 2014-03-29 DIAGNOSIS — D72829 Elevated white blood cell count, unspecified: Secondary | ICD-10-CM

## 2014-03-29 DIAGNOSIS — R740 Nonspecific elevation of levels of transaminase and lactic acid dehydrogenase [LDH]: Secondary | ICD-10-CM

## 2014-03-29 DIAGNOSIS — E876 Hypokalemia: Secondary | ICD-10-CM

## 2014-03-29 LAB — BASIC METABOLIC PANEL
Anion gap: 10 (ref 5–15)
CO2: 27 mmol/L (ref 19–32)
CREATININE: 0.63 mg/dL (ref 0.50–1.35)
Calcium: 8.5 mg/dL (ref 8.4–10.5)
Chloride: 95 mmol/L — ABNORMAL LOW (ref 96–112)
GFR calc Af Amer: >90 mL/min (ref >90)
Glucose, Bld: 91 mg/dL (ref 70–99)
POTASSIUM: 3.8 mmol/L (ref 3.5–5.1)
Sodium: 132 mmol/L — ABNORMAL LOW (ref 135–145)

## 2014-03-29 LAB — URINALYSIS, ROUTINE W REFLEX MICROSCOPIC
BILIRUBIN URINE: NEGATIVE
Glucose, UA: NEGATIVE mg/dL
Ketones, ur: NEGATIVE mg/dL
LEUKOCYTES UA: NEGATIVE
Nitrite: NEGATIVE
PH: 6 (ref 5.0–8.0)
Protein, ur: NEGATIVE mg/dL
SPECIFIC GRAVITY, URINE: 1.013 (ref 1.005–1.030)
Urobilinogen, UA: 0.2 mg/dL (ref 0.0–1.0)

## 2014-03-29 LAB — COMPREHENSIVE METABOLIC PANEL
ALT: 38 U/L (ref 0–53)
AST: 82 U/L — ABNORMAL HIGH (ref 0–37)
Albumin: 3.4 g/dL — ABNORMAL LOW (ref 3.5–5.2)
Alkaline Phosphatase: 120 U/L — ABNORMAL HIGH (ref 39–117)
Anion gap: 8 (ref 5–15)
BILIRUBIN TOTAL: 1.1 mg/dL (ref 0.3–1.2)
CO2: 32 mmol/L (ref 19–32)
Calcium: 8.3 mg/dL — ABNORMAL LOW (ref 8.4–10.5)
Chloride: 94 mmol/L — ABNORMAL LOW (ref 96–112)
Creatinine, Ser: 0.63 mg/dL (ref 0.50–1.35)
GFR calc non Af Amer: >90 mL/min (ref >90)
GLUCOSE: 102 mg/dL — AB (ref 70–99)
Potassium: 2.9 mmol/L — ABNORMAL LOW (ref 3.5–5.1)
SODIUM: 134 mmol/L — AB (ref 135–145)
TOTAL PROTEIN: 6.7 g/dL (ref 6.0–8.3)

## 2014-03-29 LAB — CBC
HEMATOCRIT: 29.8 % — AB (ref 39.0–52.0)
HEMOGLOBIN: 10.4 g/dL — AB (ref 13.0–17.0)
MCH: 33.4 pg (ref 26.0–34.0)
MCHC: 34.9 g/dL (ref 30.0–36.0)
MCV: 95.8 fL (ref 78.0–100.0)
Platelets: 225 10*3/uL (ref 150–400)
RBC: 3.11 MIL/uL — AB (ref 4.22–5.81)
RDW: 13.6 % (ref 11.5–15.5)
WBC: 12.3 10*3/uL — ABNORMAL HIGH (ref 4.0–10.5)

## 2014-03-29 LAB — CLOSTRIDIUM DIFFICILE BY PCR: CDIFFPCR: NEGATIVE

## 2014-03-29 LAB — URINE MICROSCOPIC-ADD ON

## 2014-03-29 LAB — RAPID URINE DRUG SCREEN, HOSP PERFORMED
Amphetamines: NOT DETECTED
BARBITURATES: NOT DETECTED
BENZODIAZEPINES: POSITIVE — AB
COCAINE: NOT DETECTED
OPIATES: NOT DETECTED
Tetrahydrocannabinol: NOT DETECTED

## 2014-03-29 LAB — TROPONIN I
TROPONIN I: 0.05 ng/mL — AB (ref <0.031)
Troponin I: 0.04 ng/mL — ABNORMAL HIGH (ref <0.031)

## 2014-03-29 LAB — MRSA PCR SCREENING: MRSA by PCR: NEGATIVE

## 2014-03-29 LAB — MAGNESIUM: Magnesium: 1.6 mg/dL (ref 1.5–2.5)

## 2014-03-29 MED ORDER — PHENYTOIN SODIUM EXTENDED 100 MG PO CAPS
300.0000 mg | ORAL_CAPSULE | Freq: Every day | ORAL | Status: DC
  Administered 2014-03-29: 300 mg via ORAL
  Filled 2014-03-29 (×2): qty 3

## 2014-03-29 MED ORDER — PHENYTOIN SODIUM EXTENDED 100 MG PO CAPS
200.0000 mg | ORAL_CAPSULE | Freq: Two times a day (BID) | ORAL | Status: DC
  Filled 2014-03-29: qty 2

## 2014-03-29 MED ORDER — MAGNESIUM SULFATE 2 GM/50ML IV SOLN
2.0000 g | Freq: Once | INTRAVENOUS | Status: AC
  Administered 2014-03-29: 2 g via INTRAVENOUS
  Filled 2014-03-29: qty 50

## 2014-03-29 MED ORDER — POTASSIUM CHLORIDE ER 10 MEQ PO TBCR
20.0000 meq | EXTENDED_RELEASE_TABLET | Freq: Every day | ORAL | Status: DC
  Administered 2014-03-29 – 2014-03-30 (×2): 20 meq via ORAL
  Filled 2014-03-29 (×2): qty 2

## 2014-03-29 MED ORDER — POTASSIUM CHLORIDE CRYS ER 20 MEQ PO TBCR
40.0000 meq | EXTENDED_RELEASE_TABLET | Freq: Once | ORAL | Status: AC
  Administered 2014-03-29: 40 meq via ORAL
  Filled 2014-03-29: qty 2

## 2014-03-29 MED ORDER — MAGNESIUM CHLORIDE 64 MG PO TBEC
2.0000 | DELAYED_RELEASE_TABLET | Freq: Every day | ORAL | Status: DC
  Administered 2014-03-29 – 2014-03-30 (×2): 128 mg via ORAL
  Filled 2014-03-29 (×2): qty 2

## 2014-03-29 NOTE — Progress Notes (Signed)
Subjective:  Joseph Underwood seen and examined in AM. His heart rate has significantly improved with 1 mg of Ativan and 15 mg of lopressor overnight. He reports being placed on dilantin for alcohol related seizures. He is not compliant with his medications. He denies diaphoresis, palpitations, anxiety, tremors, chest pain, dyspnea, or hallucinations.     Objective: Vital signs in last 24 hours: Filed Vitals:   03/29/14 0007 03/29/14 0124 03/29/14 0130 03/29/14 0639  BP: 165/102 166/108 166/101 168/97  Pulse: 120 122 108 108  Temp: 98.9 F (37.2 C)   99 F (37.2 C)  TempSrc: Oral   Oral  Resp: 19   18  Height:      Weight:      SpO2: 99% 100%  99%   Weight change:   Intake/Output Summary (Last 24 hours) at 03/29/14 1237 Last data filed at 03/29/14 1000  Gross per 24 hour  Intake    240 ml  Output    652 ml  Net   -412 ml   PHYSICAL EXAMINATION: General: Alert, well-developed, and cooperative to examination.  HEENT: B/l parotid gland enlargement without tenderness. Lungs: Normal respiratory effort, no accessory muscle use, normal breath sounds, no crackles, and no wheezes. Heart: Normal rate, regular rhythm, no murmur, no gallop, and no rub.  Abdomen: Soft, non-tender, normal bowel sounds, no distention, no guarding, no rebound tenderness  Extremities: No edema Neurologic: Alert & oriented X3, mild b/l hand tremor    Lab Results: Basic Metabolic Panel:  Recent Labs Lab 03/28/14 1031  03/28/14 1810 03/28/14 2112 03/29/14 0600  NA  --   < > 132*  --  134*  K  --   < > 3.5  --  2.9*  CL  --   < > 88*  --  94*  CO2  --   < > 28  --  32  GLUCOSE  --   < > 84  --  102*  BUN  --   < > <5*  --  <5*  CREATININE  --   < > 0.82  --  0.63  CALCIUM  --   < > 9.3  --  8.3*  MG 1.0*  --   --  1.3*  --   PHOS  --   --   --  3.6  --   < > = values in this interval not displayed. Liver Function Tests:  Recent Labs Lab 03/28/14 1810 03/29/14 0600  AST 118* 82*  ALT 50 38  ALKPHOS  140* 120*  BILITOT 1.4* 1.1  PROT 7.6 6.7  ALBUMIN 4.0 3.4*   CBC:  Recent Labs Lab 03/28/14 1810 03/29/14 0600  WBC 17.1* 12.3*  NEUTROABS 14.7*  --   HGB 12.1* 10.4*  HCT 34.5* 29.8*  MCV 96.6 95.8  PLT 247 225   Cardiac Enzymes:  Recent Labs Lab 03/28/14 1810 03/29/14 0020 03/29/14 0600  TROPONINI 0.04* 0.04* 0.05*   Fasting Lipid Panel:  Recent Labs Lab 03/28/14 2112  CHOL 395*  HDL 61  LDLCALC UNABLE TO CALCULATE IF TRIGLYCERIDE OVER 400 mg/dL  TRIG 676*  CHOLHDL 6.5   Coagulation:  Recent Labs Lab 03/28/14 2112  LABPROT 12.1  INR 0.89   Urine Drug Screen: Drugs of Abuse     Component Value Date/Time   LABOPIA NONE DETECTED 03/29/2014 0439   COCAINSCRNUR NONE DETECTED 03/29/2014 0439   LABBENZ POSITIVE* 03/29/2014 0439   AMPHETMU NONE DETECTED 03/29/2014 0439   THCU NONE DETECTED  03/29/2014 0439   LABBARB NONE DETECTED 03/29/2014 0439    Alcohol Level:  Recent Labs Lab 03/28/14 2112  ETH <5   Urinalysis:  Recent Labs Lab 03/29/14 0439  COLORURINE YELLOW  LABSPEC 1.013  PHURINE 6.0  GLUCOSEU NEGATIVE  HGBUR TRACE*  BILIRUBINUR NEGATIVE  KETONESUR NEGATIVE  PROTEINUR NEGATIVE  UROBILINOGEN 0.2  NITRITE NEGATIVE  LEUKOCYTESUR NEGATIVE    Micro Results: Recent Results (from the past 240 hour(s))  MRSA PCR Screening     Status: None   Collection Time: 03/29/14 12:40 AM  Result Value Ref Range Status   MRSA by PCR NEGATIVE NEGATIVE Final    Comment:        The GeneXpert MRSA Assay (FDA approved for NASAL specimens only), is one component of a comprehensive MRSA colonization surveillance program. It is not intended to diagnose MRSA infection nor to guide or monitor treatment for MRSA infections.    Studies/Results: Dg Chest 2 View  03/28/2014   CLINICAL DATA:  Leukocytosis. Feeling bad for 2 days, with tachycardia. Initial encounter.  EXAM: CHEST  2 VIEW  COMPARISON:  Chest radiograph performed 12/17/2013  FINDINGS:  The lungs are well-aerated. Mild peribronchial thickening is seen. There is no evidence of focal opacification, pleural effusion or pneumothorax.  The heart is normal in size; the mediastinal contour is within normal limits. No acute osseous abnormalities are seen.  IMPRESSION: Mild peribronchial thickening seen; lungs otherwise clear.   Electronically Signed   By: Garald Balding M.D.   On: 03/28/2014 21:16   US Abdomen Limited  03/28/2014   CLINICAL DATA:  Hepatic lesion.  EXAM: US ABDOMEN LIMITED - RIGHT UPPER QUADRANT  COMPARISON:  CT scan in March 13, 2014.  FINDINGS: Gallbladder:  No gallstones or wall thickening visualized. No sonographic Murphy sign noted. Minimal amount of sludge is present. No pericholecystic fluid is noted.  Common bile duct:  Diameter: 4 mm which is within normal limits.  Liver:  8 mm hyperechoic focus is noted in the left hepatic lobe corresponding in location to abnormality seen on CT scan. Within normal limits in parenchymal echogenicity.  IMPRESSION: Minimal gallbladder sludge is present.  8 mm hyperechoic focus is noted in left hepatic lobe corresponding to abnormality seen on CT scan. In the absence of any history of malignancy, this most likely represents hemangioma. Follow-up ultrasound in 6 months is recommended to ensure stability. If the patient does have a history of malignancy, then metastatic disease cannot be excluded and further evaluation with MRI would be recommended.   Electronically Signed   By: Marijo Conception, M.D.   On: 03/28/2014 11:35   Medications: I have reviewed the patient's current medications. Scheduled Meds: . amLODipine  10 mg Oral Daily  . enoxaparin (LOVENOX) injection  40 mg Subcutaneous Q24H  . folic acid  1 mg Oral Daily  . magnesium chloride  2 tablet Oral Daily  . multivitamin with minerals  1 tablet Oral Daily  . thiamine  100 mg Oral Daily   Continuous Infusions:  PRN Meds:.LORazepam **OR** LORazepam, metoprolol,  tetrahydrozoline Assessment/Plan: Active Problems:   Seizures   Essential hypertension, benign   Hypertriglyceridemia   Tobacco dependence   Protein-calorie malnutrition   Hypomagnesemia   Alcohol withdrawal   Alcohol abuse   Normocytic anemia   Tachycardia with 141 - 160 beats per minute   Leukocytosis   Elevated troponin   History of Clostridium difficile    Sinus Tachycardia in setting of Alcohol Withdrawal - Current CIWA  score 4 , has received  1 mg of Ativan since hospitalization. Currently denies withdrawal symptoms. AG on admission has closed. Ethanol level < 5 on admission. ASTL ALT in 2:1 ratio. INR normal. No liver cirrhosis on US abdomen on 03/28/14.  -Continue seizure precautions -Continue CIWA protocol  -Continue multivitamin, thiamine 100 mg daily, and folate 1 mg daily -Continue IV lopressor 5 mg PRN HR >110 -SW consult for alcohol cessation counseling if agreeable   Hypomagnesia & Hypokalemia - K 3.8 and Mg 1.6 this AM.  Joseph Underwood noncompliant with home magnesium and potassium supplements. Mg low of 0.9.  -Restart home magnesium chloride 128 mg daily and potassium 20 mEq daily -Replete K & Mg as needed   Hypertension - Currently 145/90.  Joseph Underwood noncompliant with home amlodipine.  -Continue home amlodipine 10 mg daily -Continue IV lopressor 5 mg PRN HR >110  History of Unspecified Seizure Disorder - Unclear etiology diagnosed 2002 (?). He is noncompliant with home dilantin 300 mg daily and total dilantin level low 3.1 (normal 10-20) on admission.  -Continue dilantin per pharmacy, 200 mg BID (s/p loading dose of 400 mg) -Needs neurology outpatient follow-up and close monitoring of dilantin levels  Hyperlipidemia - Lipid panel on 03/28/14 with total cholesterol 395, TG 676, HDL 61. Joseph Underwood not on medical therapy.  -Consider starting gemfibrozil  -Follow-up LDL level  History of c diff diarrhea - Joseph Underwood reports some loose stools and did not complete flagyl treatment in January.   -Continue to monitor   Elevated troponin - Slight elevation of 0.04 to 0.05 most likely in setting of demand from sinus tachycardia. History of elevated troponins 03/10/14.  -Continue to monitor on telemetry   -Follow-up 12-lead EKG  Leukocytosis - WBC improved to 12.3 K from 14.4 K on admission. Most likely inflammatory in nature as there is no current source of infection.  -Follow-up urine culture -Follow-up HIV Ab  Anemia of Chronic Disease - Last anemia panel on 03/21/14. Hg 10.4 below baseline of 12 with no active bleeding.   -Monitor for bleeding  -Continue to monitor CBC -Joseph Underwood needs screening colonoscopy at age 53  Diet: Heart DVT PPx: Lovenox 40 mg daily Code: Full  Juluis Mire, MD PGY-2 IMTS Pager: [phone]  Dispo: Disposition is deferred at this time, awaiting improvement of current medical problems.  Anticipated discharge in approximately 1 day(s).   The patient does have a current PCP Drucilla Schmidt, MD) and does need an [facility] hospital follow-up appointment after discharge.  The patient does not have transportation limitations that hinder transportation to clinic appointments.  .Services Needed at time of discharge: Y = Yes, Blank = No Joseph Underwood:  No  OT:  No  RN:  No  Equipment:  No  Other:  None    LOS: 1 day   Juluis Mire, MD 03/29/2014, 12:37 PM

## 2014-03-29 NOTE — Progress Notes (Signed)
UR completed 

## 2014-03-29 NOTE — Telephone Encounter (Signed)
I called Joseph Underwood at home and spoke with his mother who answered her cell phone. Mom checked his pulse at home to be 120 but we discussed returning to clinic for further evaluation and they agreed. Patient was later evaluated in the opc again and admitted to IMTS.    Discussed with both Dr. Beryle Beams and Dr. Dareen Piano

## 2014-03-29 NOTE — Progress Notes (Signed)
Educated pt the need to call out for help with ambulation. Informed pt to call nurse's or nurse tech's number on board before getting oob or to use the call bell. Bed alarm on and call bell in reach.

## 2014-03-29 NOTE — Progress Notes (Addendum)
PHARMACIST - PHYSICIAN COMMUNICATION  CONCERNING:  Dilantin   RECOMMENDATION: Resume his home dose of 300 mg po HS  DESCRIPTION: His phenytoin level was low at admission due to non-compliance. He received a 400 mg po dose last PM at midnight  Do you wish to resume his home dose of 300 mg po HS?  No new seizures noted   Thank you. Anette Guarneri, PharmD [phone]    Addendum Pharmacy to dose Phenytoin Consult  Corrected Phenytoin level 3.9  Pt received 400 mg po load last night. It appears compliance may be playing a role in the low level. Will resume home dose and re-check level in ~5days  Plan Continue Phenytoin 300 mg ER po qhs   Hughes Better, PharmD, BCPS Clinical Pharmacist Pager: [phone] 03/29/2014 5:07 PM

## 2014-03-29 NOTE — Progress Notes (Signed)
Helped pt to bathroom. Pt alert and oriented. Asked pt to call pull cord in bathroom when finished. Came back to check on pt and pt was ambulating back to bed, did not call out. Educated the pt again the need to call out. Call bell in pts reach bed alarm on. Cato Mulligan RN

## 2014-03-29 NOTE — Progress Notes (Signed)
HR up to 140's, by time RN in room to assess patient HR down to 120's, BP 160/108, IV Lopressor 5mg  IVP given per order. HR down to 108, BP stable. IV Mg 2gm also given per order. Will continue to monitor.

## 2014-03-30 LAB — BASIC METABOLIC PANEL
Anion gap: 4 — ABNORMAL LOW (ref 5–15)
BUN: <5 mg/dL — ABNORMAL LOW (ref 6–23)
CHLORIDE: 99 mmol/L (ref 96–112)
CO2: 32 mmol/L (ref 19–32)
CREATININE: 0.56 mg/dL (ref 0.50–1.35)
Calcium: 8.8 mg/dL (ref 8.4–10.5)
GFR calc Af Amer: >90 mL/min (ref >90)
Glucose, Bld: 96 mg/dL (ref 70–99)
Potassium: 3.2 mmol/L — ABNORMAL LOW (ref 3.5–5.1)
Sodium: 135 mmol/L (ref 135–145)

## 2014-03-30 LAB — CBC WITH DIFFERENTIAL/PLATELET
BASOS ABS: 0 10*3/uL (ref 0.0–0.1)
Basophils Relative: 0 % (ref 0–1)
Eosinophils Absolute: 0.1 10*3/uL (ref 0.0–0.7)
Eosinophils Relative: 1 % (ref 0–5)
HCT: 30.6 % — ABNORMAL LOW (ref 39.0–52.0)
HEMOGLOBIN: 10.3 g/dL — AB (ref 13.0–17.0)
Lymphocytes Relative: 19 % (ref 12–46)
Lymphs Abs: 1.6 10*3/uL (ref 0.7–4.0)
MCH: 33.4 pg (ref 26.0–34.0)
MCHC: 33.7 g/dL (ref 30.0–36.0)
MCV: 99.4 fL (ref 78.0–100.0)
MONO ABS: 0.4 10*3/uL (ref 0.1–1.0)
Monocytes Relative: 5 % (ref 3–12)
NEUTROS ABS: 6.1 10*3/uL (ref 1.7–7.7)
Neutrophils Relative %: 75 % (ref 43–77)
Platelets: 196 10*3/uL (ref 150–400)
RBC: 3.08 MIL/uL — ABNORMAL LOW (ref 4.22–5.81)
RDW: 13.6 % (ref 11.5–15.5)
WBC: 8.2 10*3/uL (ref 4.0–10.5)

## 2014-03-30 LAB — URINE CULTURE
Colony Count: NO GROWTH
Culture: NO GROWTH

## 2014-03-30 LAB — LDL CHOLESTEROL, DIRECT: Direct LDL: 130 mg/dL — ABNORMAL HIGH

## 2014-03-30 LAB — MAGNESIUM: Magnesium: 1.4 mg/dL — ABNORMAL LOW (ref 1.5–2.5)

## 2014-03-30 MED ORDER — POTASSIUM CHLORIDE CRYS ER 20 MEQ PO TBCR
40.0000 meq | EXTENDED_RELEASE_TABLET | Freq: Once | ORAL | Status: AC
  Administered 2014-03-30: 40 meq via ORAL
  Filled 2014-03-30: qty 2

## 2014-03-30 NOTE — Progress Notes (Signed)
Subjective: Patient denies any tremors, palpitations, and anxiety this morning. CIWA stable at 1.   Objective: Vital signs in last 24 hours: Filed Vitals:   03/29/14 0639 03/29/14 1300 03/29/14 2001 03/30/14 0500  BP: 168/97 145/90 152/90 154/106  Pulse: 108 108 109 108  Temp: 99 F (37.2 C) 98.8 F (37.1 C) 98.8 F (37.1 C) 98.5 F (36.9 C)  TempSrc: Oral Oral Oral Oral  Resp: 18 18 18 16   Height:      Weight:      SpO2: 99% 100% 100% 100%   Weight change:   Intake/Output Summary (Last 24 hours) at 03/30/14 1006 Last data filed at 03/29/14 1415  Gross per 24 hour  Intake    240 ml  Output      0 ml  Net    240 ml   Physical Exam General: alert, sitting up in bed, NAD HEENT: [place]/AT, EOMI, mucus membranes moist CV: RRR, no m/g/r Pulm: CTA bilaterally, breaths non-labored Abd: BS+, soft, non-tender Ext: warm, no edema, moves all Neuro: alert and oriented x 3, no focal deficits, no tremors present  Lab Results: Basic Metabolic Panel:  Recent Labs Lab 03/28/14 2112  03/29/14 1155 03/30/14 0440  NA  --   < > 132* 135  K  --   < > 3.8 3.2*  CL  --   < > 95* 99  CO2  --   < > 27 32  GLUCOSE  --   < > 91 96  BUN  --   < > <5* <5*  CREATININE  --   < > 0.63 0.56  CALCIUM  --   < > 8.5 8.8  MG 1.3*  --  1.6 1.4*  PHOS 3.6  --   --   --   < > = values in this interval not displayed. Liver Function Tests:  Recent Labs Lab 03/28/14 1810 03/29/14 0600  AST 118* 82*  ALT 50 38  ALKPHOS 140* 120*  BILITOT 1.4* 1.1  PROT 7.6 6.7  ALBUMIN 4.0 3.4*   CBC:  Recent Labs Lab 03/28/14 1810 03/29/14 0600 03/30/14 0440  WBC 17.1* 12.3* 8.2  NEUTROABS 14.7*  --  6.1  HGB 12.1* 10.4* 10.3*  HCT 34.5* 29.8* 30.6*  MCV 96.6 95.8 99.4  PLT 247 225 196   Cardiac Enzymes:  Recent Labs Lab 03/28/14 1810 03/29/14 0020 03/29/14 0600  TROPONINI 0.04* 0.04* 0.05*   Fasting Lipid Panel:  Recent Labs Lab 03/28/14 2112  CHOL 395*  HDL 61  LDLCALC UNABLE  TO CALCULATE IF TRIGLYCERIDE OVER 400 mg/dL  TRIG 676*  CHOLHDL 6.5   Coagulation:  Recent Labs Lab 03/28/14 2112  LABPROT 12.1  INR 0.89   Studies/Results: Dg Chest 2 View  03/28/2014   CLINICAL DATA:  Leukocytosis. Feeling bad for 2 days, with tachycardia. Initial encounter.  EXAM: CHEST  2 VIEW  COMPARISON:  Chest radiograph performed 12/17/2013  FINDINGS: The lungs are well-aerated. Mild peribronchial thickening is seen. There is no evidence of focal opacification, pleural effusion or pneumothorax.  The heart is normal in size; the mediastinal contour is within normal limits. No acute osseous abnormalities are seen.  IMPRESSION: Mild peribronchial thickening seen; lungs otherwise clear.   Electronically Signed   By: Garald Balding M.D.   On: 03/28/2014 21:16   US Abdomen Limited  03/28/2014   CLINICAL DATA:  Hepatic lesion.  EXAM: US ABDOMEN LIMITED - RIGHT UPPER QUADRANT  COMPARISON:  CT scan in  March 13, 2014.  FINDINGS: Gallbladder:  No gallstones or wall thickening visualized. No sonographic Murphy sign noted. Minimal amount of sludge is present. No pericholecystic fluid is noted.  Common bile duct:  Diameter: 4 mm which is within normal limits.  Liver:  8 mm hyperechoic focus is noted in the left hepatic lobe corresponding in location to abnormality seen on CT scan. Within normal limits in parenchymal echogenicity.  IMPRESSION: Minimal gallbladder sludge is present.  8 mm hyperechoic focus is noted in left hepatic lobe corresponding to abnormality seen on CT scan. In the absence of any history of malignancy, this most likely represents hemangioma. Follow-up ultrasound in 6 months is recommended to ensure stability. If the patient does have a history of malignancy, then metastatic disease cannot be excluded and further evaluation with MRI would be recommended.   Electronically Signed   By: Marijo Conception, M.D.   On: 03/28/2014 11:35   Medications: I have reviewed the patient's current  medications. Scheduled Meds: . amLODipine  10 mg Oral Daily  . enoxaparin (LOVENOX) injection  40 mg Subcutaneous Q24H  . folic acid  1 mg Oral Daily  . magnesium chloride  2 tablet Oral Daily  . multivitamin with minerals  1 tablet Oral Daily  . phenytoin  300 mg Oral QHS  . potassium chloride  20 mEq Oral Daily  . thiamine  100 mg Oral Daily   Continuous Infusions:  PRN Meds:.LORazepam **OR** LORazepam, metoprolol, tetrahydrozoline Assessment/Plan:  Sinus Tachycardia in setting of Alcohol Withdrawal: CIWA 1 this morning. Patient denies any withdrawal symptoms. HR stable in low 100s. Stable to be discharged home. Will have follow up in IM clinic.  - Continue CIWA protocol  - Continue multivitamin, thiamine 100 mg daily, and folate 1 mg daily - Continue IV lopressor 5 mg PRN HR >110 - SW consulted for alcohol cessation counseling if agreeable   Hypomagnesia & Hypokalemia: K 3.2 and Mg 1.4 this AM. Pt noncompliant with home magnesium and potassium supplements. Mg low of 0.9. Patient counseled on importance of taking home potassium and magnesium.  - Continue home magnesium chloride 128 mg daily and potassium 20 mEq daily - Replete K & Mg as needed   Hypertension: Currently 154/106. Pt noncompliant with home amlodipine.  - Continue home amlodipine 10 mg daily - Continue IV lopressor 5 mg PRN HR >110  History of Unspecified Seizure Disorder: Unclear etiology diagnosed 2002 (?). He is noncompliant with home dilantin 300 mg daily and total dilantin level low 3.1 (normal 10-20) on admission. Patient counseled on importance of taking home Dilantin to prevent seizures.  - Continue dilantin per pharmacy, 300 mg QHS  - Needs neurology outpatient follow-up and close monitoring of dilantin levels  Hyperlipidemia: Lipid panel on 03/28/14 with total cholesterol 395, TG 676, HDL 61. Pt not on medical therapy.  - Consider starting gemfibrozil, but unlikely to be compliant with medication  -  Follow-up LDL level, pending   History of C diff diarrhea: Pt reports some loose stools and did not complete flagyl treatment in January. C diff antigen negative.   Elevated troponin: Slight elevation of 0.04 to 0.05 most likely in setting of demand from sinus tachycardia. History of elevated troponins 03/10/14. Denies any chest pain. EKG unchanged.  - Continue to monitor on telemetry   Leukocytosis- Improved: WBC improved to 8.2 from 12.3. Most likely inflammatory in nature as there is no current source of infection.  - Follow-up urine culture, pending  - Follow-up HIV  Ab, pending   Anemia of Chronic Disease: Last anemia panel on 03/21/14. Hg 10.3 below baseline of 12 with no active bleeding.  - CBC at outpatient appointment  - Pt needs screening colonoscopy at age 15, set up at outpatient appt   Diet: Heart healthy  VTE PPx: Lovenox SQ Dispo: Discharge today  The patient does have a current PCP Drucilla Schmidt, MD) and does need an [facility] hospital follow-up appointment after discharge.  The patient does not have transportation limitations that hinder transportation to clinic appointments.  .Services Needed at time of discharge: Y = Yes, Blank = No PT:   OT:   RN:   Equipment:   Other:     LOS: 2 days   Albin Felling, MD 03/30/2014, 10:06 AM

## 2014-03-30 NOTE — Discharge Instructions (Signed)
It was a pleasure taking care of you, Mr. [place]ique.   Please remember to take your Dilantin to prevent seizures.   Also please take your magnesium and potassium supplements.   You will be called to schedule a follow up appointment in the clinic.   Take care, Dr. Arcelia Jew

## 2014-03-30 NOTE — Discharge Summary (Signed)
Name: Joseph Underwood MRN: [id number] DOB: [birth year] 49 y.o. PCP: Joseph Schmidt, MD  Date of Admission: 03/28/2014  5:07 PM Date of Discharge: 03/30/2014 Attending Physician: Joseph Crews, MD  Discharge Diagnosis: Principal Problem: Sinus Tachycardia in Setting of Alcohol Withdrawal Active Problems: Hypomagnesemia and Hypokalemia HTN History of Unspecified Seizure Disorder Hyperlipidemia History of C diff Diarrhea Elevated Troponin Leukocytosis Anemia of Chronic Disease   Discharge Medications:   Medication List    STOP taking these medications        metroNIDAZOLE 500 MG tablet  Commonly known as:  FLAGYL      TAKE these medications        amLODipine 10 MG tablet  Commonly known as:  NORVASC  Take 1 tablet (10 mg total) by mouth daily.     folic acid 1 MG tablet  Commonly known as:  FOLVITE  Take 1 tablet (1 mg total) by mouth daily.     magnesium chloride 64 MG Tbec SR tablet  Commonly known as:  SLOW-MAG  Take 2 tablets (128 mg total) by mouth daily.     ondansetron 8 MG disintegrating tablet  Commonly known as:  ZOFRAN-ODT  Take 1 tablet (8 mg total) by mouth once.     phenytoin 100 MG ER capsule  Commonly known as:  DILANTIN  Take 300 mg by mouth at bedtime.     Potassium Chloride ER 20 MEQ Tbcr  Take 20 mEq by mouth daily.     VISINE OP  Place 1 drop into both eyes daily as needed (dry eyes).        Disposition and follow-up:   [name] was discharged from [facility] in Good condition.  At the hospital follow up visit please address:  1.  Alcohol abuse- Is patient still drinking? Any further withdrawal symptoms? Is he interested in quitting? Hypomag/HypoK- check bmet HTN- recheck BP. Is pt taking Amlodipine? Seizure Disorder- Is pt taking Dilantin? Any seizures at home?  HLD- can discuss with PCP about starting gemfibrozil   2.  Labs / imaging needed at time of follow-up: bmet  3.  Pending labs/ test  needing follow-up: None   Follow-up Appointments: Dr. Hayes Underwood- [facility] on 04/11/14  Discharge Instructions:     Discharge Instructions    Diet - low sodium heart healthy    Complete by:  As directed      Increase activity slowly    Complete by:  As directed           Procedures Performed:  Ct Abdomen Pelvis Wo Contrast  03/13/2014   CLINICAL DATA:  Pt admitted after ETOH related seizures. Pt.s Hgb= 9.0; r/o retroperitoneal bleed. Pt denies any abdominal pain, has diarrhea "when he eats"; Positive for c-diff.  EXAM: CT ABDOMEN AND PELVIS WITHOUT CONTRAST  TECHNIQUE: Multidetector CT imaging of the abdomen and pelvis was performed following the standard protocol without IV contrast.  COMPARISON:  08/14/2013 and 10/27/2005  FINDINGS: Lung bases demonstrate mild posterior bibasilar linear atelectasis. There is a tiny amount of pericardial fluid  Abdominal images demonstrate mild diffuse hepatic steatosis. There is an 8 mm focal hypodensity over the lateral segment of the left lobe of the liver. Mild amount of dependent density within the gallbladder likely sludge versus small stones. The spleen is somewhat small but otherwise unremarkable. The pancreas and adrenal glands are unremarkable. Kidneys are normal in size, shape and position without hydronephrosis nephrolithiasis. The ureters are unremarkable. There is mild calcified  plaque over the abdominal aorta. Appendix is normal. There is mild diverticulosis of the colon without active inflammation. The no free fluid or inflammatory change within the abdomen.  Pelvic images demonstrate the bladder, prostate and rectum to be within normal. Possible tiny amount of free pelvic fluid. There are mild degenerative changes of the spine.  IMPRESSION: No acute findings in the abdomen/ pelvis.  Suggestion of mild gallbladder sludge versus small stones.  Mild diffuse hepatic steatosis. Possible 8 mm hypodensity lateral segment left lobe of the liver. Recommend  ultrasound versus a contrast-enhanced CT on an elective basis to confirm this finding.  Mild diverticulosis of the colon.  Tiny amount of free pelvic fluid.   Electronically Signed   By: Joseph Underwood M.D.   On: 03/13/2014 14:34   Dg Chest 2 View  03/28/2014   CLINICAL DATA:  Leukocytosis. Feeling bad for 2 days, with tachycardia. Initial encounter.  EXAM: CHEST  2 VIEW  COMPARISON:  Chest radiograph performed 12/17/2013  FINDINGS: The lungs are well-aerated. Mild peribronchial thickening is seen. There is no evidence of focal opacification, pleural effusion or pneumothorax.  The heart is normal in size; the mediastinal contour is within normal limits. No acute osseous abnormalities are seen.  IMPRESSION: Mild peribronchial thickening seen; lungs otherwise clear.   Electronically Signed   By: Joseph Underwood M.D.   On: 03/28/2014 21:16   Ct Head Wo Contrast  03/10/2014   CLINICAL DATA:  Weakness seizure for 15 min. Patient fell with hematoma to the center of forehead. Patient typically drinks 1 pint per day and has not had anything to drink in 3 days.  EXAM: CT HEAD WITHOUT CONTRAST  TECHNIQUE: Contiguous axial images were obtained from the base of the skull through the vertex without intravenous contrast.  COMPARISON:  12/16/2013  FINDINGS: Mild diffuse cerebral atrophy. Mild prominence of ventricles consistent with central atrophy. No mass effect or midline shift. No abnormal extra-axial fluid collections. Gray-white matter junctions are distinct. Basal cisterns are not effaced. No evidence of acute intracranial hemorrhage. No depressed skull fractures. Mucosal thickening in the right maxillary antrum. Mastoid air cells are not opacified.  IMPRESSION: No acute intracranial abnormalities. Mild diffuse cerebral atrophy. No change since prior study.   Electronically Signed   By: Joseph Underwood M.D.   On: 03/10/2014 00:12   US Abdomen Limited  03/28/2014   CLINICAL DATA:  Hepatic lesion.  EXAM: US ABDOMEN  LIMITED - RIGHT UPPER QUADRANT  COMPARISON:  CT scan in March 13, 2014.  FINDINGS: Gallbladder:  No gallstones or wall thickening visualized. No sonographic Murphy sign noted. Minimal amount of sludge is present. No pericholecystic fluid is noted.  Common bile duct:  Diameter: 4 mm which is within normal limits.  Liver:  8 mm hyperechoic focus is noted in the left hepatic lobe corresponding in location to abnormality seen on CT scan. Within normal limits in parenchymal echogenicity.  IMPRESSION: Minimal gallbladder sludge is present.  8 mm hyperechoic focus is noted in left hepatic lobe corresponding to abnormality seen on CT scan. In the absence of any history of malignancy, this most likely represents hemangioma. Follow-up ultrasound in 6 months is recommended to ensure stability. If the patient does have a history of malignancy, then metastatic disease cannot be excluded and further evaluation with MRI would be recommended.   Electronically Signed   By: Marijo Conception, M.D.   On: 03/28/2014 11:35    Admission HPI: Joseph Underwood is a 49 year  old man with history of alcohol abuse, seizure disorder, hypomagnesemia, and HTN presenting with tachycardia. He presented to [facility] clinic today for routine follow up after having a liver ultrasound this morning to follow up a liver lesion, which was consistent with a hemangioma. At that visit, he was noted to be tremulous, tachycardic, and diaphoretic. He was recently hospitalized and discharged on 03/15/2014 for seizures related to alcohol withdrawal, and required Precedex drip. He reports trying to cut back on his alcohol over the last week. He previously had been drinking 1 pint of gin per day for the last 20 years, and he did cut back to one half pint of gin over the last week. He reports last drinking yesterday around 7 PM when he completed a half pint of gin. Today, he has had very little to drink due to his ultrasound procedure this morning and clinic  appointment this afternoon. He says that following his ultrasound, he returned home and had one sip of gin, but he has not drank anything other than that. He also reports poor by mouth intake over the last day. He was recently treated for C. difficile with with Flagyl. He reports resolution of his diarrhea, but he has missed several doses of his Flagyl and has 6 days worth of medicine left in his bottle. He denies fevers, chills, chest pain, anxiety, palpitations, or hallucinations.  Hospital Course by problem list:  Sinus Tachycardia in Setting of Alcohol Withdrawal: Patient was admitted from the IM clinic after found to be tremulous, tachycardic, and diaphoretic. His symptoms were thought to be due to alcohol withdrawal in combination with dehydration. He was placed on CIWA protocol, given IVFs, and his home Dilantin 300 mg daily was started to prevent seizure. He received only 1 mg of Ativan during his admission. By the following day he denied any withdrawal symptoms. By time of discharge his HR was back in the 90s-low 100s. Social work provided the patient with alcohol cessation counseling. Patient has follow up in the IM clinic on 04/11/14.   Hypomagnesemia and Hypokalemia: Mg 1.0 on admission. He has a history of hypomagnesemia. His Mg was replaced. Also noted to have K 2.9 and this was replaced as well. Patient will need a repeat bmet at his IM follow up visit.   HTN: BP remained in 765-465K systolic range. Patient is noncompliant with his home Amlodipine 10 mg daily. He was placed on this medication during his hospitalization. He will need a BP recheck at his outpatient follow up visit.   History of Unspecified Seizure Disorder: Unclear etiology, diagnosed in 2002 (?). He was found to be noncompliant with his home Dilantin 300 mg daily as his total Dilantin level was low at 3.1 on admission. He was restarted on his home Dilantin 300 mg QHS. Patient was counseled on the importance of taking his home  Dilantin to prevent future seizures.  He will need neurology follow up and close monitoring of his Dilantin levels. A neurology referral should be placed by his PCP.   Hyperlipidemia: Lipid panel on 03/28/14 with total cholesterol 395, TG 676, HDL 61. His LDL level was 130. Pt not on medical therapy at home. There was consideration to start Gemfibrozil, but patient has a history of noncompliance with medications and thus this was not started. This should be discussed with his PCP, Dr. Sherrine Maples. He has a follow up clinic appointment on 04/11/14.   History of C diff Diarrhea: Patient had reported loose stools and did not complete  his Flagyl treatment for C diff diarrhea in January 2016. A repeat C diff antigen was obtained and was negative. His loose stools improved by time of discharge.   Elevated Troponin: Slight elevation of 0.04 to 0.05 most likely in setting of demand from sinus tachycardia. History of elevated troponins on 03/10/14. Denied any chest pain. EKG remained unchanged. He was monitored on telemetry during his hospitalization with no events recorded.   Leukocytosis: WBC 14.4 on admission. WBC continued to improve to 12.3 and then 8.2. His leukocytosis was most likely inflammatory in nature as no source of infection was identified. His urine culture showed no growth to date. C diff negative. HIV antibody nonreactive.   Anemia of Chronic Disease: Hbg 12.3 on admission. He last had an anemia panel on 03/21/14. His Hbg trended down to 10.4 which is below his baseline of 12. He had no active bleeding. Patient needs to have a screening colonoscopy set up at age 42.   Discharge Vitals:   BP 154/106 mmHg  Pulse 108  Temp(Src) 98.5 F (36.9 C) (Oral)  Resp 16  Ht 5\' 5"  (1.651 m)  Wt 110 lb 7.2 oz (50.1 kg)  BMI 18.38 kg/m2  SpO2 100% Physical Exam General: alert, sitting up in bed, NAD HEENT: [place]/AT, EOMI, mucus membranes moist CV: RRR, no m/g/r Pulm: CTA bilaterally, breaths non-labored Abd:  BS+, soft, non-tender Ext: warm, no edema, moves all Neuro: alert and oriented x 3, no focal deficits, no tremors present  Discharge Labs:  Results for orders placed or performed during the hospital encounter of 03/28/14 (from the past 24 hour(s))  Magnesium     Status: None   Collection Time: 03/29/14 11:55 AM  Result Value Ref Range   Magnesium 1.6 1.5 - 2.5 mg/dL  Basic metabolic panel     Status: Abnormal   Collection Time: 03/29/14 11:55 AM  Result Value Ref Range   Sodium 132 (L) 135 - 145 mmol/L   Potassium 3.8 3.5 - 5.1 mmol/L   Chloride 95 (L) 96 - 112 mmol/L   CO2 27 19 - 32 mmol/L   Glucose, Bld 91 70 - 99 mg/dL   BUN <5 (L) 6 - 23 mg/dL   Creatinine, Ser 0.63 0.50 - 1.35 mg/dL   Calcium 8.5 8.4 - 10.5 mg/dL   GFR calc non Af Amer >90 >90 mL/min   GFR calc Af Amer >90 >90 mL/min   Anion gap 10 5 - 15  Basic metabolic panel     Status: Abnormal   Collection Time: 03/30/14  4:40 AM  Result Value Ref Range   Sodium 135 135 - 145 mmol/L   Potassium 3.2 (L) 3.5 - 5.1 mmol/L   Chloride 99 96 - 112 mmol/L   CO2 32 19 - 32 mmol/L   Glucose, Bld 96 70 - 99 mg/dL   BUN <5 (L) 6 - 23 mg/dL   Creatinine, Ser 0.56 0.50 - 1.35 mg/dL   Calcium 8.8 8.4 - 10.5 mg/dL   GFR calc non Af Amer >90 >90 mL/min   GFR calc Af Amer >90 >90 mL/min   Anion gap 4 (L) 5 - 15  Magnesium     Status: Abnormal   Collection Time: 03/30/14  4:40 AM  Result Value Ref Range   Magnesium 1.4 (L) 1.5 - 2.5 mg/dL  CBC with Differential/Platelet     Status: Abnormal   Collection Time: 03/30/14  4:40 AM  Result Value Ref Range   WBC 8.2 4.0 -  10.5 K/uL   RBC 3.08 (L) 4.22 - 5.81 MIL/uL   Hemoglobin 10.3 (L) 13.0 - 17.0 g/dL   HCT 30.6 (L) 39.0 - 52.0 %   MCV 99.4 78.0 - 100.0 fL   MCH 33.4 26.0 - 34.0 pg   MCHC 33.7 30.0 - 36.0 g/dL   RDW 13.6 11.5 - 15.5 %   Platelets 196 150 - 400 K/uL   Neutrophils Relative % 75 43 - 77 %   Neutro Abs 6.1 1.7 - 7.7 K/uL   Lymphocytes Relative 19 12 - 46 %    Lymphs Abs 1.6 0.7 - 4.0 K/uL   Monocytes Relative 5 3 - 12 %   Monocytes Absolute 0.4 0.1 - 1.0 K/uL   Eosinophils Relative 1 0 - 5 %   Eosinophils Absolute 0.1 0.0 - 0.7 K/uL   Basophils Relative 0 0 - 1 %   Basophils Absolute 0.0 0.0 - 0.1 K/uL    Signed: Albin Felling, MD 03/30/2014, 11:43 AM    Services Ordered on Discharge: None Equipment Ordered on Discharge: None

## 2014-03-30 NOTE — Progress Notes (Signed)
Utilization Review Completed.   Karyss Frese, RN, BSN Nurse Case Manager  

## 2014-03-31 LAB — I-STAT TROPONIN, ED: Troponin i, poc: 0.01 ng/mL (ref 0.00–0.08)

## 2014-03-31 LAB — HIV ANTIBODY (ROUTINE TESTING W REFLEX): HIV SCREEN 4TH GENERATION: NONREACTIVE

## 2014-04-01 ENCOUNTER — Encounter: Payer: Self-pay | Admitting: Licensed Clinical Social Worker

## 2014-04-02 ENCOUNTER — Emergency Department (HOSPITAL_COMMUNITY)
Admission: EM | Admit: 2014-04-02 | Discharge: 2014-04-02 | Disposition: A | Discharge status: Home / Self Care | Payer: Self-pay | Attending: Emergency Medicine | Admitting: Emergency Medicine

## 2014-04-02 ENCOUNTER — Encounter (HOSPITAL_COMMUNITY): Payer: Self-pay

## 2014-04-02 DIAGNOSIS — R000 Tachycardia, unspecified: Secondary | ICD-10-CM | POA: Insufficient documentation

## 2014-04-02 DIAGNOSIS — I10 Essential (primary) hypertension: Secondary | ICD-10-CM | POA: Insufficient documentation

## 2014-04-02 DIAGNOSIS — Z79899 Other long term (current) drug therapy: Secondary | ICD-10-CM | POA: Insufficient documentation

## 2014-04-02 DIAGNOSIS — Z720 Tobacco use: Secondary | ICD-10-CM | POA: Insufficient documentation

## 2014-04-02 DIAGNOSIS — R040 Epistaxis: Secondary | ICD-10-CM | POA: Insufficient documentation

## 2014-04-02 DIAGNOSIS — H1131 Conjunctival hemorrhage, right eye: Secondary | ICD-10-CM | POA: Insufficient documentation

## 2014-04-02 NOTE — ED Notes (Signed)
Pt A&OX4, ambulatory at d/c, NAD

## 2014-04-02 NOTE — Discharge Instructions (Signed)
Nosebleed Mr. Martinique, your nose stopped bleeding and has not come back while in the ED.  Your have refused blood work and IV fluids.  Follow up with your primary care physician within 3 days for continued management.  If symptoms worsen, come back to the ED for repeat evaluation. Thank you. A nosebleed can be caused by many things, including:  Getting hit hard in the nose.  Infections.  Dry nose.  Colds.  Medicines. Your doctor may do lab testing if you get nosebleeds a lot and the cause is not known. HOME CARE   If your nose was packed with material, keep it there until your doctor takes it out. Put the pack back in your nose if the pack falls out.  Do not blow your nose for 12 hours after the nosebleed.  Sit up and bend forward if your nose starts bleeding again. Pinch the front half of your nose nonstop for 20 minutes.  Put petroleum jelly inside your nose every morning if you have a dry nose.  Use a humidifier to make the air less dry.  Do not take aspirin.  Try not to strain, lift, or bend at the waist for many days after the nosebleed. GET HELP RIGHT AWAY IF:   Nosebleeds keep happening and are hard to stop or control.  You have bleeding or bruises that are not normal on other parts of the body.  You have a fever.  The nosebleeds get worse.  You get lightheaded, feel faint, sweaty, or throw up (vomit) blood. MAKE SURE YOU:   Understand these instructions.  Will watch your condition.  Will get help right away if you are not doing well or get worse. Document Released: 11/10/2007 Document Revised: 04/25/2011 Document Reviewed: 11/10/2007 [facility] Patient Information 2015 [place], [place]. This information is not intended to replace advice given to you by your health care provider. Make sure you discuss any questions you have with your health care provider.

## 2014-04-02 NOTE — ED Provider Notes (Signed)
CSN: [id number]     Arrival date & time 04/02/14  4008 History  This chart was scribed for Joseph Balls, MD by Molli Posey, ED Scribe. This patient was seen in room B16C/B16C and the patient's care was started 3:33 AM.   Chief Complaint  Patient presents with  . Epistaxis   The history is provided by the patient. No language interpreter was used.   HPI Comments: Joseph Underwood is a 49 y.o. male with a history of alcohol abuse and HTN who presents to the Emergency Department complaining of epistaxis that started 1.5 hours ago. Pt states his nose has stopped bleeding at this time. Pt reports he was in the hospital 4 days ago and was on blood thinning medication at that time but does not take them regularly. He states that he was hospitalized for an alcohol seizure. Pt reports a hx of nose bleeds but states it normally is not as severe as it was tonight. Pt reports he has some alcohol tonight as well and says he drinks every other day. He currently does not feel as though he is having the shakes her going into withdrawals. Patient has no further complaints.   Past Medical History  Diagnosis Date  . Alcohol abuse   . Hiccups   . HTN (hypertension)   . Alcohol related seizure    Past Surgical History  Procedure Laterality Date  . No past surgeries     Family History  Problem Relation Age of Onset  . Hypertension Mother    History  Substance Use Topics  . Smoking status: Current Every Day Smoker -- 0.50 packs/day for 37 years    Types: Cigarettes  . Smokeless tobacco: Never Used  . Alcohol Use: 52.8 oz/week    14 Cans of beer, 74 Shots of liquor per week     Comment: 12/16/2013 "~ 1 pint of liquor per day"    Review of Systems  HENT: Positive for nosebleeds.   All other systems reviewed and are negative.     Allergies  Review of patient's allergies indicates no known allergies.  Home Medications   Prior to Admission medications   Medication Sig Start Date End Date Taking?  Authorizing Provider  amLODipine (NORVASC) 10 MG tablet Take 1 tablet (10 mg total) by mouth daily. 03/21/14   Wilber Oliphant, MD  folic acid (FOLVITE) 1 MG tablet Take 1 tablet (1 mg total) by mouth daily. 01/31/14   Madilyn Fireman, MD  magnesium chloride (SLOW-MAG) 64 MG TBEC SR tablet Take 2 tablets (128 mg total) by mouth daily. 03/16/14   Francesca Oman, DO  ondansetron (ZOFRAN-ODT) 8 MG disintegrating tablet Take 1 tablet (8 mg total) by mouth once. Patient taking differently: Take 8 mg by mouth 2 (two) times daily as needed for nausea or vomiting.  10/28/13   Carmin Muskrat, MD  phenytoin (DILANTIN) 100 MG ER capsule Take 300 mg by mouth at bedtime.  03/15/14   Historical Provider, MD  potassium chloride 20 MEQ TBCR Take 20 mEq by mouth daily. 03/15/14   Corky Sox, MD  Tetrahydrozoline HCl (VISINE OP) Place 1 drop into both eyes daily as needed (dry eyes).    Historical Provider, MD   BP 156/94 mmHg  Pulse 103  Temp(Src) 98 F (36.7 C) (Oral)  Resp 16  Ht 5\' 5"  (1.651 m)  Wt 110 lb (49.896 kg)  BMI 18.31 kg/m2  SpO2 99% Physical Exam  Constitutional: He is oriented to person, place,  and time. Vital signs are normal. He appears well-developed and well-nourished.  Non-toxic appearance. He does not appear ill. No distress.  Clinically intoxicated.   HENT:  Head: Normocephalic and atraumatic.  Nose: Nose normal.  Mouth/Throat: Oropharynx is clear and moist. No oropharyngeal exudate.  Dried blood in left naris.   Eyes: Conjunctivae and EOM are normal. Pupils are equal, round, and reactive to light. No scleral icterus.  Right eye conjunctival hemorrhage.   Neck: Normal range of motion. Neck supple. No tracheal deviation, no edema, no erythema and normal range of motion present. No thyroid mass and no thyromegaly present.  Cardiovascular: Regular rhythm, S1 normal, S2 normal, normal heart sounds, intact distal pulses and normal pulses.  Tachycardia present.  Exam reveals no gallop and  no friction rub.   No murmur heard. Pulses:      Radial pulses are 2+ on the right side, and 2+ on the left side.       Dorsalis pedis pulses are 2+ on the right side, and 2+ on the left side.  Pulmonary/Chest: Effort normal and breath sounds normal. No respiratory distress. He has no wheezes. He has no rhonchi. He has no rales.  Abdominal: Soft. Normal appearance and bowel sounds are normal. He exhibits no distension, no ascites and no mass. There is no hepatosplenomegaly. There is no tenderness. There is no rebound, no guarding and no CVA tenderness.  Musculoskeletal: Normal range of motion. He exhibits no edema or tenderness.  Lymphadenopathy:    He has no cervical adenopathy.  Neurological: He is alert and oriented to person, place, and time. He has normal strength. No cranial nerve deficit or sensory deficit.  Skin: Skin is warm, dry and intact. No petechiae and no rash noted. He is not diaphoretic. No erythema. No pallor.  Psychiatric: He has a normal mood and affect. His behavior is normal. Judgment normal.  Nursing note and vitals reviewed.   ED Course  Procedures   DIAGNOSTIC STUDIES: Oxygen Saturation is 99% on RA, normal by my interpretation.    COORDINATION OF CARE: 3:37 AM Discussed treatment plan with pt at bedside and pt agreed to plan.   Labs Review Labs Reviewed - No data to display  Imaging Review No results found.   EKG Interpretation None      MDM   Final diagnoses:  None   patient presents to the emergency department for epistaxis. Upon EMS arrival the bleed had stopped. It has not continued in the emergency department. Patient states he is on blood thinners while in the hospital recently but has not taken any meds outside the hospital. He is refusing any lab draws or IV fluids despite his tachycardia. He states that they stuck in multiple times during his last admission and he does not want it anymore. Risk of refusing IV fluids and evaluating his  tachycardia was explained to him, patient exhibits decision making capacity. Patient was tachycardic during his last visit to this emergency department as well. At this time his vital signs are within his normal limits and the patient is safe for discharge.   I personally performed the services described in this documentation, which was scribed in my presence. The recorded information has been reviewed and is accurate.   Joseph Balls, MD 04/02/14 [phone]

## 2014-04-02 NOTE — ED Notes (Signed)
Per ems: pt from home, approximately 2.5 hours ago patients nose began to bleed and 15 minutes prior to EMS being called out the nose bleed has stopped. Hx of nose bleeds.  VSS. No nose bleed at the moment. Pt ambulatory to room, A&OX4. Pt admits to ETOH use about "half a pint." denies illegal drug use.

## 2014-04-09 ENCOUNTER — Other Ambulatory Visit: Payer: Self-pay | Admitting: Internal Medicine

## 2014-04-11 ENCOUNTER — Ambulatory Visit: Payer: Self-pay | Admitting: Internal Medicine

## 2014-04-15 ENCOUNTER — Ambulatory Visit (INDEPENDENT_AMBULATORY_CARE_PROVIDER_SITE_OTHER): Payer: Self-pay | Admitting: Internal Medicine

## 2014-04-15 VITALS — BP 111/81 | HR 127 | Temp 98.2°F | Ht 65.0 in

## 2014-04-15 DIAGNOSIS — F1010 Alcohol abuse, uncomplicated: Secondary | ICD-10-CM

## 2014-04-15 DIAGNOSIS — E876 Hypokalemia: Secondary | ICD-10-CM

## 2014-04-15 DIAGNOSIS — R569 Unspecified convulsions: Secondary | ICD-10-CM

## 2014-04-15 LAB — BASIC METABOLIC PANEL WITH GFR
BUN: 9 mg/dL (ref 6–23)
CALCIUM: 9.3 mg/dL (ref 8.4–10.5)
CO2: 31 mEq/L (ref 19–32)
CREATININE: 1.05 mg/dL (ref 0.50–1.35)
Chloride: 92 mEq/L — ABNORMAL LOW (ref 96–112)
GFR, Est Non African American: 84 mL/min
GLUCOSE: 102 mg/dL — AB (ref 70–99)
Potassium: 4.2 mEq/L (ref 3.5–5.3)
Sodium: 133 mEq/L — ABNORMAL LOW (ref 135–145)

## 2014-04-15 LAB — MAGNESIUM: Magnesium: 1.1 mg/dL — ABNORMAL LOW (ref 1.5–2.5)

## 2014-04-15 MED ORDER — VITAMIN B-1 100 MG PO TABS: 100.0000 mg | ORAL_TABLET | Freq: Every day | ORAL | Status: DC

## 2014-04-15 NOTE — Assessment & Plan Note (Signed)
Mg 1.4 at hospital d/c after supplementation. He is taking his Mg supplementation at home. Repeating Mg today.

## 2014-04-15 NOTE — Progress Notes (Signed)
Internal Medicine Clinic Attending  Case discussed with Dr. Glenn at the time of the visit.  We reviewed the resident's history and exam and pertinent patient test results.  I agree with the assessment, diagnosis, and plan of care documented in the resident's note.  

## 2014-04-15 NOTE — Assessment & Plan Note (Addendum)
Pt continues to drink approx 4x week, and drinks 1/2 pint of gin when he drinks. Today he is tremulous and tachycardic. His last drink was on Sunday. He is not interested in quitting drinking, despite counseling from multiple providers including myself. He is having dizziness and lightheadedness at times, which seems to be related to his EtOH use. Possible that he has Wernicke encephalopathy resulting in the gait ataxia and dizzy feeling, so will prescribe thiamine. - Continue to encourage cessation - Thiamine 100mg  po daily

## 2014-04-15 NOTE — Assessment & Plan Note (Signed)
K 3.4 after supplementation in the hospital. He is on 65mEq KCl at home.  - Checking BMP today

## 2014-04-15 NOTE — Progress Notes (Signed)
Patient ID: Joseph Underwood, male   DOB: [birth year], 49 y.o.   MRN: [id number]  Subjective:   Patient ID: Joseph Underwood male   DOB: [birth year] 49 y.o.   MRN: [id number]  HPI: [name] is a 49 y.o. M w/ PMH alcohol abuse, seizure disorder, hypomagnesemia, and HTN who presents as a hospital f/u for tachycardia related EtOH withdrawal.   He was admitted from clinic with sinus tachycardia and signs of EtOH withdrawal. He was placed on CIWA protocol and continued on his home on Dilantin for seizures prevention. Pt with a h/o seizure d/o and his dilantin level was found to be low on admission. At discharge, his HR was in 90-low 100s. EtOH cessation information was provided by the hospital CSW.   His Mg and K were also low 1.0 and 2.9, likely from his EtOH abuse. Mg and K were replaced.   Since his hospitalization he state that he has fallen while getting out of the car onto the grass and in his house. He said he did hit his head when he fell on the grass but did not his any rocks. He did break his glasses and has felt more dizzy without his glasses. He is not taking the thiamine, folic acid, or MVI.    Past Medical History  Diagnosis Date  . Alcohol abuse   . Hiccups   . HTN (hypertension)   . Alcohol related seizure    Current Outpatient Prescriptions  Medication Sig Dispense Refill  . amLODipine (NORVASC) 10 MG tablet Take 1 tablet (10 mg total) by mouth daily. 30 tablet 2  . folic acid (FOLVITE) 1 MG tablet Take 1 tablet (1 mg total) by mouth daily. 30 tablet 6  . MAG64 535 (64 MG) MG TBCR TAKE 2 TABLETS BY MOUTH EVERY DAY 60 tablet 0  . ondansetron (ZOFRAN-ODT) 8 MG disintegrating tablet Take 1 tablet (8 mg total) by mouth once. (Patient taking differently: Take 8 mg by mouth 2 (two) times daily as needed for nausea or vomiting. ) 20 tablet 0  . phenytoin (DILANTIN) 100 MG ER capsule Take 300 mg by mouth at bedtime.   2  . potassium chloride 20 MEQ TBCR Take 20 mEq by mouth daily. 30  tablet 2  . Tetrahydrozoline HCl (VISINE OP) Place 1 drop into both eyes daily as needed (dry eyes).     No current facility-administered medications for this visit.   Family History  Problem Relation Age of Onset  . Hypertension Mother    History   Social History  . Marital Status: Single    Spouse Name: N/A  . Number of Children: N/A  . Years of Education: N/A   Social History Main Topics  . Smoking status: Current Every Day Smoker -- 0.50 packs/day for 37 years    Types: Cigarettes  . Smokeless tobacco: Never Used  . Alcohol Use: 52.8 oz/week    14 Cans of beer, 74 Shots of liquor per week     Comment: 12/16/2013 "~ 1 pint of liquor per day"  . Drug Use: No  . Sexual Activity: No   Other Topics Concern  . Not on file   Social History Narrative   Review of Systems: A 12 point ROS was performed; pertinent positives and negatives were noted in the HPI   Objective:  Physical Exam: Filed Vitals:   04/15/14 1547 04/15/14 1553  BP: 125/86 111/81  Pulse: 117 127  Temp: 98.2 F (36.8 C)  TempSrc: Oral   Height: 5\' 5"  (1.651 m)   SpO2: 99%    Constitutional: Vital signs reviewed.  Patient is a mildly cachectic male in no acute distress and cooperative with exam. Alert and oriented x3.  Head: Normocephalic and atraumatic Eyes: PERRL, EOMI Neck: Trachea midline, normal ROM Cardiovascular: Regular rate, tachcyardic, no MRG, pulses symmetric and intact bilaterally Pulmonary/Chest: Normal respiratory effort, CTAB, no wheezes, rales, or rhonchi Abdominal: Soft. Non-tender, non-distended, bowel sounds are normal Musculoskeletal: Moves all 4 extremities Neurological: A&O x3, cranial nerve II-XII are grossly intact, no focal motor deficit, gait normal.  Skin: Warm, dry and intact.   Psychiatric: Normal mood and affect. Speech and behavior is normal.  Assessment & Plan:   Please refer to Problem List based Assessment and Plan

## 2014-04-15 NOTE — Patient Instructions (Addendum)
Quitting drinking will help improve your dizziness and help you be more functional at work. Be sure to stay hydrated and drink plenty of water as alcohol use can make you dehydrated.   Return to the clinic in 1 month for a f/u/  If you decide that you would like to stop drinking, please contact the clinic and we can try to assist you.   General Instructions:   Please bring your medicines with you each time you come to clinic.  Medicines may include prescription medications, over-the-counter medications, herbal remedies, eye drops, vitamins, or other pills.   Progress Toward Treatment Goals:  Treatment Goal 03/28/2014  Blood pressure improved  Stop smoking -  Prevent falls -    Self Care Goals & Plans:  Self Care Goal 04/15/2014  Manage my medications take my medicines as prescribed; bring my medications to every visit; refill my medications on time; follow the sick day instructions if I am sick  Monitor my health -  Eat healthy foods eat more vegetables; eat fruit for snacks and desserts; eat baked foods instead of fried foods; eat smaller portions  Be physically active find an activity I enjoy  Prevent falls -  Meeting treatment goals -    No flowsheet data found.   Care Management & Community Referrals:  Referral 02/01/2013  Referrals made for care management support none needed

## 2014-04-15 NOTE — Assessment & Plan Note (Signed)
No seizures since hospital d/c. Pt endorses compliance with his dilantin.  - Continue Dilantin 300mg  daily

## 2014-04-17 ENCOUNTER — Other Ambulatory Visit: Payer: Self-pay | Admitting: Internal Medicine

## 2014-04-17 ENCOUNTER — Encounter: Payer: Self-pay | Admitting: *Deleted

## 2014-04-17 NOTE — Progress Notes (Signed)
Talked with patient and he will come in Monday for labs only.   Scheduled.

## 2014-04-21 ENCOUNTER — Other Ambulatory Visit (INDEPENDENT_AMBULATORY_CARE_PROVIDER_SITE_OTHER): Payer: Self-pay

## 2014-04-21 LAB — MAGNESIUM: Magnesium: 1 mg/dL — ABNORMAL LOW (ref 1.5–2.5)

## 2014-04-24 ENCOUNTER — Other Ambulatory Visit: Payer: Self-pay | Admitting: Internal Medicine

## 2014-04-24 MED ORDER — MAGNESIUM CHLORIDE ER 535 (64 MG) MG PO TBCR: 3.0000 | EXTENDED_RELEASE_TABLET | Freq: Two times a day (BID) | ORAL | Status: DC

## 2014-04-24 NOTE — Progress Notes (Signed)
Pt called and instructed to take Magnesium tablets #2 bid.  Instructions given by Dr Eulas Post where changed to this amount. Pt understands and he has a f/u appointment next Friday.

## 2014-04-24 NOTE — Progress Notes (Signed)
Mg is 1.1. Increasing Magnesium Chloride 64 mg to 3 tablets BID. Pt to return to the clinic in 1 week for repeat serum Mg.

## 2014-05-02 ENCOUNTER — Encounter: Payer: Self-pay | Admitting: Internal Medicine

## 2014-05-02 ENCOUNTER — Ambulatory Visit: Payer: Self-pay | Admitting: Internal Medicine

## 2014-05-02 ENCOUNTER — Other Ambulatory Visit: Payer: Self-pay

## 2014-05-08 ENCOUNTER — Encounter: Payer: Self-pay | Admitting: Internal Medicine

## 2014-05-22 ENCOUNTER — Encounter: Payer: Self-pay | Admitting: Internal Medicine

## 2014-05-22 ENCOUNTER — Ambulatory Visit (INDEPENDENT_AMBULATORY_CARE_PROVIDER_SITE_OTHER): Payer: Self-pay | Admitting: Internal Medicine

## 2014-05-22 ENCOUNTER — Ambulatory Visit (HOSPITAL_COMMUNITY)
Admission: AD | Admit: 2014-05-22 | Discharge: 2014-05-22 | Disposition: A | Discharge status: Home / Self Care | Payer: Self-pay | Source: Ambulatory Visit | Attending: Internal Medicine | Admitting: Internal Medicine

## 2014-05-22 ENCOUNTER — Observation Stay (HOSPITAL_COMMUNITY): Payer: Self-pay

## 2014-05-22 ENCOUNTER — Other Ambulatory Visit: Payer: Self-pay | Admitting: Internal Medicine

## 2014-05-22 ENCOUNTER — Inpatient Hospital Stay (HOSPITAL_COMMUNITY)
Admission: AD | Admit: 2014-05-22 | Discharge: 2014-05-25 | DRG: 894 | Discharge status: Against Medical Advice | Payer: Self-pay | Source: Ambulatory Visit | Attending: Internal Medicine | Admitting: Internal Medicine

## 2014-05-22 DIAGNOSIS — D649 Anemia, unspecified: Secondary | ICD-10-CM

## 2014-05-22 DIAGNOSIS — Z79899 Other long term (current) drug therapy: Secondary | ICD-10-CM

## 2014-05-22 DIAGNOSIS — R000 Tachycardia, unspecified: Secondary | ICD-10-CM

## 2014-05-22 DIAGNOSIS — I951 Orthostatic hypotension: Secondary | ICD-10-CM | POA: Insufficient documentation

## 2014-05-22 DIAGNOSIS — F17210 Nicotine dependence, cigarettes, uncomplicated: Secondary | ICD-10-CM | POA: Diagnosis present

## 2014-05-22 DIAGNOSIS — G40909 Epilepsy, unspecified, not intractable, without status epilepticus: Secondary | ICD-10-CM | POA: Diagnosis present

## 2014-05-22 DIAGNOSIS — E876 Hypokalemia: Secondary | ICD-10-CM | POA: Diagnosis present

## 2014-05-22 DIAGNOSIS — F1010 Alcohol abuse, uncomplicated: Secondary | ICD-10-CM

## 2014-05-22 DIAGNOSIS — R569 Unspecified convulsions: Secondary | ICD-10-CM

## 2014-05-22 DIAGNOSIS — F10939 Alcohol use, unspecified with withdrawal, unspecified: Secondary | ICD-10-CM

## 2014-05-22 DIAGNOSIS — I10 Essential (primary) hypertension: Secondary | ICD-10-CM | POA: Diagnosis present

## 2014-05-22 DIAGNOSIS — Z720 Tobacco use: Secondary | ICD-10-CM | POA: Diagnosis present

## 2014-05-22 DIAGNOSIS — F10239 Alcohol dependence with withdrawal, unspecified: Principal | ICD-10-CM | POA: Diagnosis present

## 2014-05-22 LAB — BASIC METABOLIC PANEL
Anion gap: 15 (ref 5–15)
BUN: 9 mg/dL (ref 6–23)
CO2: 31 mmol/L (ref 19–32)
CREATININE: 0.88 mg/dL (ref 0.50–1.35)
Calcium: 8.8 mg/dL (ref 8.4–10.5)
Chloride: 85 mmol/L — ABNORMAL LOW (ref 96–112)
GFR calc non Af Amer: >90 mL/min (ref >90)
Glucose, Bld: 109 mg/dL — ABNORMAL HIGH (ref 70–99)
Potassium: 3.8 mmol/L (ref 3.5–5.1)
Sodium: 131 mmol/L — ABNORMAL LOW (ref 135–145)

## 2014-05-22 LAB — CBC
HEMATOCRIT: 36.3 % — AB (ref 39.0–52.0)
Hemoglobin: 12.9 g/dL — ABNORMAL LOW (ref 13.0–17.0)
MCH: 33.9 pg (ref 26.0–34.0)
MCHC: 35.5 g/dL (ref 30.0–36.0)
MCV: 95.5 fL (ref 78.0–100.0)
Platelets: 215 10*3/uL (ref 150–400)
RBC: 3.8 MIL/uL — ABNORMAL LOW (ref 4.22–5.81)
RDW: 14.1 % (ref 11.5–15.5)
WBC: 9.7 10*3/uL (ref 4.0–10.5)

## 2014-05-22 LAB — HEPATIC FUNCTION PANEL
ALT: 26 U/L (ref 0–53)
AST: 94 U/L — ABNORMAL HIGH (ref 0–37)
Albumin: 3.7 g/dL (ref 3.5–5.2)
Alkaline Phosphatase: 148 U/L — ABNORMAL HIGH (ref 39–117)
Bilirubin, Direct: 0.2 mg/dL (ref 0.0–0.5)
Indirect Bilirubin: 0.8 mg/dL (ref 0.3–0.9)
Total Bilirubin: 1 mg/dL (ref 0.3–1.2)
Total Protein: 7.6 g/dL (ref 6.0–8.3)

## 2014-05-22 LAB — RAPID URINE DRUG SCREEN, HOSP PERFORMED
Amphetamines: NOT DETECTED
BENZODIAZEPINES: NOT DETECTED
Barbiturates: NOT DETECTED
COCAINE: NOT DETECTED
OPIATES: NOT DETECTED
Tetrahydrocannabinol: NOT DETECTED

## 2014-05-22 LAB — ALBUMIN: ALBUMIN: 4 g/dL (ref 3.5–5.2)

## 2014-05-22 LAB — ETHANOL

## 2014-05-22 LAB — PHOSPHORUS: Phosphorus: 3.6 mg/dL (ref 2.3–4.6)

## 2014-05-22 LAB — MAGNESIUM: Magnesium: 0.8 mg/dL — CL (ref 1.5–2.5)

## 2014-05-22 LAB — MRSA PCR SCREENING: MRSA BY PCR: NEGATIVE

## 2014-05-22 LAB — TSH: TSH: 1.082 u[IU]/mL (ref 0.350–4.500)

## 2014-05-22 LAB — PHENYTOIN LEVEL, TOTAL: Phenytoin Lvl: 16.2 ug/mL (ref 10.0–20.0)

## 2014-05-22 MED ORDER — VITAMIN B-1 100 MG PO TABS: 100.0000 mg | ORAL_TABLET | Freq: Every day | ORAL | Status: DC

## 2014-05-22 MED ORDER — MAGNESIUM CHLORIDE 64 MG PO TBEC
3.0000 | DELAYED_RELEASE_TABLET | Freq: Two times a day (BID) | ORAL | Status: DC
  Administered 2014-05-22 – 2014-05-24 (×4): 192 mg via ORAL
  Filled 2014-05-22 (×5): qty 3

## 2014-05-22 MED ORDER — ENOXAPARIN SODIUM 40 MG/0.4ML ~~LOC~~ SOLN: 40.0000 mg | SUBCUTANEOUS | Status: DC

## 2014-05-22 MED ORDER — LORAZEPAM 2 MG/ML IJ SOLN: 1.0000 mg | Freq: Four times a day (QID) | INTRAMUSCULAR | Status: DC | PRN

## 2014-05-22 MED ORDER — LORAZEPAM 2 MG/ML IJ SOLN
1.0000 mg | Freq: Four times a day (QID) | INTRAMUSCULAR | Status: DC | PRN
  Administered 2014-05-22 – 2014-05-25 (×3): 1 mg via INTRAVENOUS
  Filled 2014-05-22 (×3): qty 1

## 2014-05-22 MED ORDER — PROMETHAZINE HCL 25 MG/ML IJ SOLN
12.5000 mg | Freq: Four times a day (QID) | INTRAMUSCULAR | Status: DC | PRN
  Administered 2014-05-23: 12.5 mg via INTRAVENOUS
  Filled 2014-05-22: qty 1

## 2014-05-22 MED ORDER — FOLIC ACID 1 MG PO TABS
1.0000 mg | ORAL_TABLET | Freq: Every day | ORAL | Status: DC
  Administered 2014-05-22 – 2014-05-24 (×3): 1 mg via ORAL
  Filled 2014-05-22 (×3): qty 1

## 2014-05-22 MED ORDER — LORAZEPAM 1 MG PO TABS: 1.0000 mg | ORAL_TABLET | Freq: Four times a day (QID) | ORAL | Status: DC | PRN

## 2014-05-22 MED ORDER — LORAZEPAM 1 MG PO TABS
1.0000 mg | ORAL_TABLET | Freq: Four times a day (QID) | ORAL | Status: DC | PRN
  Administered 2014-05-23 – 2014-05-24 (×2): 1 mg via ORAL
  Filled 2014-05-22 (×3): qty 1

## 2014-05-22 MED ORDER — LORAZEPAM 2 MG/ML IJ SOLN
1.0000 mg | Freq: Once | INTRAMUSCULAR | Status: AC
  Administered 2014-05-22: 1 mg via INTRAVENOUS
  Filled 2014-05-22: qty 1

## 2014-05-22 MED ORDER — ADULT MULTIVITAMIN W/MINERALS CH
1.0000 | ORAL_TABLET | Freq: Every day | ORAL | Status: DC
Start: 2014-05-22 — End: 2014-05-23

## 2014-05-22 MED ORDER — MAGNESIUM SULFATE 4 GM/100ML IV SOLN
4.0000 g | Freq: Once | INTRAVENOUS | Status: AC
Start: 2014-05-22 — End: 2014-05-22
  Administered 2014-05-22: 4 g via INTRAVENOUS
  Filled 2014-05-22: qty 100

## 2014-05-22 MED ORDER — AMLODIPINE BESYLATE 10 MG PO TABS
10.0000 mg | ORAL_TABLET | Freq: Every day | ORAL | Status: DC
  Administered 2014-05-22 – 2014-05-24 (×3): 10 mg via ORAL
  Filled 2014-05-22 (×3): qty 1

## 2014-05-22 MED ORDER — SODIUM CHLORIDE 0.9 % IJ SOLN
3.0000 mL | Freq: Two times a day (BID) | INTRAMUSCULAR | Status: DC
  Administered 2014-05-22 – 2014-05-24 (×4): 3 mL via INTRAVENOUS

## 2014-05-22 MED ORDER — VITAMIN B-1 100 MG PO TABS
100.0000 mg | ORAL_TABLET | Freq: Every day | ORAL | Status: DC
  Administered 2014-05-22 – 2014-05-24 (×3): 100 mg via ORAL
  Filled 2014-05-22 (×3): qty 1

## 2014-05-22 MED ORDER — THIAMINE HCL 100 MG/ML IJ SOLN: 100.0000 mg | Freq: Every day | INTRAMUSCULAR | Status: DC

## 2014-05-22 MED ORDER — FOLIC ACID 1 MG PO TABS: 1.0000 mg | ORAL_TABLET | Freq: Every day | ORAL | Status: DC

## 2014-05-22 MED ORDER — SODIUM CHLORIDE 0.9 % IV SOLN
INTRAVENOUS | Status: DC
  Administered 2014-05-22: 23:00:00 via INTRAVENOUS
  Administered 2014-05-23: 75 mL/h via INTRAVENOUS
  Administered 2014-05-24: 02:00:00 via INTRAVENOUS

## 2014-05-22 MED ORDER — ENOXAPARIN SODIUM 40 MG/0.4ML ~~LOC~~ SOLN
40.0000 mg | SUBCUTANEOUS | Status: DC
  Administered 2014-05-22 – 2014-05-24 (×3): 40 mg via SUBCUTANEOUS
  Filled 2014-05-22 (×3): qty 0.4

## 2014-05-22 MED ORDER — NICOTINE 21 MG/24HR TD PT24
21.0000 mg | MEDICATED_PATCH | Freq: Every day | TRANSDERMAL | Status: DC
  Administered 2014-05-22 – 2014-05-24 (×3): 21 mg via TRANSDERMAL
  Filled 2014-05-22 (×3): qty 1

## 2014-05-22 MED ORDER — PHENYTOIN SODIUM EXTENDED 100 MG PO CAPS
300.0000 mg | ORAL_CAPSULE | Freq: Every day | ORAL | Status: DC
  Administered 2014-05-22 – 2014-05-24 (×3): 300 mg via ORAL
  Filled 2014-05-22 (×3): qty 3

## 2014-05-22 NOTE — H&P (Signed)
Date: 05/22/2014               Patient Name:  Joseph Underwood MRN: [id number]  DOB: [birth year] Age / Sex: 49 y.o., male   PCP: Karlene Einstein, MD         Medical Service: Internal Medicine Teaching Service         Attending Physician: Dr. Bertha Stakes, MD    First Contact: Dr. Raelene Bott Pager: [phone]  Second Contact: Dr. Naaman Plummer Pager: [phone]       After Hours (After 5p/  First Contact Pager: [phone]  weekends / holidays): Second Contact Pager: [phone]   Chief Complaint: dizziness, shakiness   History of Present Illness:   Patient is a 49 year old gentleman with a history of alcohol abuse, seizures, tobacco abuse, hypertension who presents to the hospital from clinic in the setting of hypomagnesemia and tremulousness. Upon interview with the patient, patient states that he has been feeling dizziness and lightheaded over the last day. He states that he does not have a sensation that the room is spinning around him. Patient does not remember really whether he has passed out at any point. Patient states that his last drink of alcohol was yesterday evening. He states that he has been compliant with his magnesium supplementation at home. Patient's mother stated that patient may have had a seizure around [place] weekend. However, patient does not have a clear memory of what may have happened during that time. He denies having lost consciousness during that time period. Overall, he states that his compliant with his medications including his seizure medication.  Patient was admitted for similar symptoms from 03/28/2014 to 03/30/2014. Patient did not have any seizures during that admission and his overall course was uncomplicated only requiring 1 mg of Ativan before discharge.  He states that he has not thought about alcohol cessation yet. He is currently a half pack a day smoker and has a 37-pack-year history. Patient denies any use of illicit drugs.  Meds: Medications Prior to Admission    Medication Sig Dispense Refill  . amLODipine (NORVASC) 10 MG tablet Take 1 tablet (10 mg total) by mouth daily. 30 tablet 2  . folic acid (FOLVITE) 1 MG tablet Take 1 tablet (1 mg total) by mouth daily. (Patient not taking: Reported on 04/15/2014) 30 tablet 6  . Magnesium Chloride (MAG64) 535 (64 MG) MG TBCR Take 3 tablets (192 mg total) by mouth 2 (two) times daily. 180 tablet 0  . metroNIDAZOLE (FLAGYL) 500 MG tablet Take 500 mg by mouth every 8 (eight) hours.  0  . ondansetron (ZOFRAN-ODT) 8 MG disintegrating tablet Take 1 tablet (8 mg total) by mouth once. (Patient not taking: Reported on 05/22/2014) 20 tablet 0  . phenytoin (DILANTIN) 100 MG ER capsule Take 300 mg by mouth at bedtime.   2  . potassium chloride 20 MEQ TBCR Take 20 mEq by mouth daily. 30 tablet 2  . Tetrahydrozoline HCl (VISINE OP) Place 1 drop into both eyes daily as needed (dry eyes).    . thiamine (VITAMIN B-1) 100 MG tablet Take 1 tablet (100 mg total) by mouth daily. 30 tablet 6   Current Facility-Administered Medications  Medication Dose Route Frequency Provider Last Rate Last Dose  . amLODipine (NORVASC) tablet 10 mg  10 mg Oral Daily Marjan Rabbani, MD      . enoxaparin (LOVENOX) injection 40 mg  40 mg Subcutaneous Q24H Marjan Rabbani, MD      . folic acid (FOLVITE) tablet  1 mg  1 mg Oral Daily Lauren D Bajbus, RPH      . LORazepam (ATIVAN) tablet 1 mg  1 mg Oral Q6H PRN Lauren D Bajbus, RPH       Or  . LORazepam (ATIVAN) injection 1 mg  1 mg Intravenous Q6H PRN Lauren D Bajbus, RPH      . magnesium chloride (SLOW-MAG) 64 MG SR tablet 192 mg  3 tablet Oral BID Marjan Rabbani, MD      . magnesium sulfate IVPB 4 g 100 mL  4 g Intravenous Once Marjan Rabbani, MD      . multivitamin with minerals tablet 1 tablet  1 tablet Oral Daily Marjan Rabbani, MD      . nicotine (NICODERM CQ - dosed in mg/24 hours) patch 21 mg  21 mg Transdermal Daily Marjan Rabbani, MD      . phenytoin (DILANTIN) ER capsule 300 mg  300 mg Oral QHS  Lyndee Leo, RPH      . promethazine (PHENERGAN) injection 12.5 mg  12.5 mg Intravenous Q6H PRN Marjan Rabbani, MD      . sodium chloride 0.9 % injection 3 mL  3 mL Intravenous Q12H Marjan Rabbani, MD      . thiamine (VITAMIN B-1) tablet 100 mg  100 mg Oral Daily Lauren D Bajbus, RPH       Or  . thiamine (B-1) injection 100 mg  100 mg Intravenous Daily Lauren D Bajbus, RPH        Past Medical History  Diagnosis Date  . Alcohol abuse   . Hiccups   . HTN (hypertension)   . Alcohol related seizure     Past Surgical History  Procedure Laterality Date  . No past surgeries       Allergies: Allergies as of 05/22/2014  . (No Known Allergies)    Family History  Problem Relation Age of Onset  . Hypertension Mother     History   Social History  . Marital Status: Single    Spouse Name: N/A  . Number of Children: N/A  . Years of Education: N/A   Occupational History  . Not on file.   Social History Main Topics  . Smoking status: Current Every Day Smoker -- 0.50 packs/day for 37 years    Types: Cigarettes  . Smokeless tobacco: Never Used  . Alcohol Use: 52.8 oz/week    14 Cans of beer, 74 Shots of liquor per week     Comment: 12/16/2013 "~ 1 pint of liquor per day"  . Drug Use: No  . Sexual Activity: No   Other Topics Concern  . Not on file   Social History Narrative     Review of Systems: All pertinent ROS as stated in HPI.   Physical Exam: Blood pressure 169/115, pulse 123, temperature 100.5 F (38.1 C), temperature source Oral, height 5\' 5"  (1.651 m), weight 105 lb 3.2 oz (47.718 kg), SpO2 99 %. General: resting in bed, tremulous, diaphoretic, in no acute distress HEENT: PERRL, EOMI, no scleral icterus Cardiac: Tachycardic, regular rhythm, no rubs, murmurs or gallops Pulm: clear to auscultation bilaterally, moving normal volumes of air Abd: soft, nontender, nondistended, BS present Ext: warm and well perfused, no pedal edema Neuro: alert and oriented X3,  cranial nerves II-XII grossly intact, strength 5 out of 5 in all 4 extremities, sensation to light touch intact throughout all 4 extremities, 2+ deep tendon reflexes throughout all 4 extremities, market tremulousness upon finger to nose maneuvers, heel-to-shin  intact, initiates speech with great hesitancy, reported to be stable by family Skin: no rashes or lesions noted Psych: appropriate affect and cognition  Lab results: Basic Metabolic Panel:  Recent Labs  05/22/14 1126 05/22/14 1435  NA 131*  --   K 3.8  --   CL 85*  --   CO2 31  --   GLUCOSE 109*  --   BUN 9  --   CREATININE 0.88  --   CALCIUM 8.8  --   MG 0.8*  --   PHOS  --  3.6   Liver Function Tests:  Recent Labs  05/22/14 1435  AST 94*  ALT 26  ALKPHOS 148*  BILITOT 1.0  PROT 7.6  ALBUMIN 4.0  3.7   No results for input(s): LIPASE, AMYLASE in the last 72 hours. No results for input(s): AMMONIA in the last 72 hours. CBC:  Recent Labs  05/22/14 1126  WBC 9.7  HGB 12.9*  HCT 36.3*  MCV 95.5  PLT 215   Cardiac Enzymes: No results for input(s): CKTOTAL, CKMB, CKMBINDEX, TROPONINI in the last 72 hours. BNP: No results for input(s): PROBNP in the last 72 hours. D-Dimer: No results for input(s): DDIMER in the last 72 hours. CBG: No results for input(s): GLUCAP in the last 72 hours. Hemoglobin A1C: No results for input(s): HGBA1C in the last 72 hours. Fasting Lipid Panel: No results for input(s): CHOL, HDL, LDLCALC, TRIG, CHOLHDL, LDLDIRECT in the last 72 hours. Thyroid Function Tests:  Recent Labs  05/22/14 1126  TSH 1.082   Anemia Panel: No results for input(s): VITAMINB12, FOLATE, FERRITIN, TIBC, IRON, RETICCTPCT in the last 72 hours. Coagulation: No results for input(s): LABPROT, INR in the last 72 hours. Urine Drug Screen: Drugs of Abuse     Component Value Date/Time   LABOPIA NONE DETECTED 03/29/2014 0439   COCAINSCRNUR NONE DETECTED 03/29/2014 0439   LABBENZ POSITIVE* 03/29/2014  0439   AMPHETMU NONE DETECTED 03/29/2014 0439   THCU NONE DETECTED 03/29/2014 0439   LABBARB NONE DETECTED 03/29/2014 0439    Alcohol Level:  Recent Labs  05/22/14 [place] <5   Urinalysis: No results for input(s): COLORURINE, LABSPEC, PHURINE, GLUCOSEU, HGBUR, BILIRUBINUR, KETONESUR, PROTEINUR, UROBILINOGEN, NITRITE, LEUKOCYTESUR in the last 72 hours.  Invalid input(s): APPERANCEUR  Imaging results:  No results found.  Other results: EKG Interpretation: Sinus tachycardia, nonspecific T-wave abnormalities  Assessment & Plan by Problem: Principal Problem:   Hypomagnesemia  Patient is a 49 year old gentleman with a history of alcohol abuse, seizures, tobacco abuse, hypertension who presents to the hospital from clinic in the setting of hypomagnesemia and tremulousness.   Alcohol withdrawal: Patient with several past admissions for alcohol withdrawal. Patient last drank alcohol the evening before presentation. Patient presenting with sinus tachycardia, hypertension, fever, tremulousness, and diaphoresis. Patient denies any recent seizures. Patient's last admission for alcohol withdrawal was unremarkable although patient does have a previous admission from January 2016 which required Precedex drip. EKG reassuring with only evidence of sinus tachycardia. Pyrexia can be common in the setting of alcohol withdrawal with potentially 16% of cases associated with respiratory infection and 12% of cases associated with urinary tract infection Sidonie Dickens et al., 1999, European Journal of Internal Medicine) -CIWA protocol with Ativan when necessary -Admission to step down unit -Chest x-ray for any other possible causes of tachycardia and fever.  Hypomagnesemia: Patient has a magnesium of 0.8. Patient has had a chronically low magnesium over the last 3 years. Patient is on magnesium chloride 192  mg twice a day at home. Patient states that he may have missed a couple doses prior to presentation.  Patient presenting with tremors and tachycardia although it is unclear whether this may be also due to alcohol withdrawal. No evidence of tetany, nystagmus.  -4 g magnesium IV -Recheck in the morning -Continue home oral magnesium chloride line -continue to monitor with telemetry  Seizure disorder: There is an unclear history as to whether patient may have had a seizure several weeks ago. Patient states that he is compliant with his phenytoin at home. Patient otherwise denies any recent seizures. Patient does have a reported history of phenytoin noncompliance last noted during his February 2016 admission when he was found to have a subtherapeutic phenytoin level. -Phenytoin level.  -Continue home phenytoin 300 mg daily at bedtime  Hypertension: Patient with blood pressure elevated to 169/115. -Continue with home amlodipine 10 mg daily. -Consider addition of clonidine in the setting of markedly increased blood pressure  Diet: Regular Prophylaxis: Lovenox Code: Full  Dispo: Disposition is deferred at this time, awaiting improvement of current medical problems. Anticipated discharge in approximately 2 day(s).   The patient does have a current PCP Karlene Einstein, MD) and does need an [facility] hospital follow-up appointment after discharge.  The patient does not have transportation limitations that hinder transportation to clinic appointments.  Signed: Luan Moore, M.D., Ph.D. Internal Medicine Teaching Service, PGY-1 05/22/2014, 5:15 PM

## 2014-05-22 NOTE — Assessment & Plan Note (Signed)
Positive orthostatics in clinic.  Likely contributing to his lightheadedness.  He is likely dehydrated in the setting alcohol use.   - provided water to drink and lunch in clinic (he did not eat much) - admission for hypomagnesemia - IV fluids

## 2014-05-22 NOTE — Assessment & Plan Note (Addendum)
He reports compliance with BID magnesium but continues to drink.  Mg was 1.0 one month ago and now 0.8.   - admission to inpatient today for severe hypomagnesemia (Mg 0.8) and need for IV replacement given concern for cardiac and neuro consequences (especially given seizure hx) with this level of depletion - EKG - sinus tachycardia at 130 bpm - telemetry monitoring - IV Mg 4g IV, monitor BMP, continue supplement prn - IV fluids, thiamine, folic acid, multivitamin - CIWA - Dilantin level and continue Dilantin per pharm - patient is agreeable to admission - case discussed with admitting resident, Dr. Naaman Plummer who will evaluate and admit the patient

## 2014-05-22 NOTE — Progress Notes (Addendum)
   Subjective:    Patient ID: Joseph Underwood, male    DOB: 1965-07-26, 49 y.o.   MRN: [id number]  HPI Comments: [name] is a 49 year old with PMH as below here for follow-up.  Please see problem based assessment and plan for details.       Past Medical History  Diagnosis Date  . Alcohol abuse   . Hiccups   . HTN (hypertension)   . Alcohol related seizure    Review of Systems  Constitutional: Negative for fever, chills and appetite change.  HENT: Negative for hearing loss.   Eyes: Negative for visual disturbance.  Respiratory: Negative for cough and shortness of breath.   Cardiovascular: Negative for chest pain, palpitations and leg swelling.  Gastrointestinal: Negative for nausea, vomiting, abdominal pain, diarrhea, constipation and blood in stool.  Genitourinary: Negative for dysuria, hematuria and difficulty urinating.  Neurological: Positive for light-headedness. Negative for syncope and headaches.  Psychiatric/Behavioral: Negative for dysphoric mood.       Filed Vitals:   05/22/14 1011  BP: 172/115  Pulse: 127  Temp: 99.1 F (37.3 C)  TempSrc: Oral  Height: 5\' 5"  (1.651 m)  Weight: 107 lb 4.8 oz (48.671 kg)  SpO2: 100%    Orthostatic vitals:  188/120, HR 125 (lying), 182/121, HR 122 (sitting), 140/104, HR 134 (standing) Objective:   Physical Exam  Constitutional: He is oriented to person, place, and time. No distress.  HENT:  Head: Normocephalic and atraumatic.  Mouth/Throat: Oropharynx is clear and moist. No oropharyngeal exudate.  Eyes: EOM are normal. Pupils are equal, round, and reactive to light.  Neck: Neck supple.  Cardiovascular: Regular rhythm and normal heart sounds.  Exam reveals no gallop and no friction rub.   No murmur heard. tachycardic  Pulmonary/Chest: Effort normal and breath sounds normal. No respiratory distress. He has no wheezes. He has no rales.  Abdominal: Soft. Bowel sounds are normal. He exhibits no distension and no mass. There is  no tenderness. There is no rebound and no guarding.  Musculoskeletal: Normal range of motion. He exhibits no edema or tenderness.  Neurological: He is alert and oriented to person, place, and time. He displays normal reflexes. No cranial nerve deficit. Coordination normal.  + tremor  Skin: Skin is warm. He is diaphoretic.  Psychiatric: He has a normal mood and affect. His behavior is normal.  Poor insight.  Vitals reviewed.         Assessment & Plan:  Please see problem based assessment and plan.

## 2014-05-22 NOTE — Progress Notes (Addendum)
CONSULT NOTE - INITIAL  Pharmacy Consult for phenytoin Indication: history of seizures  No Known Allergies  Patient Measurements: Height: 5\' 5"  (165.1 cm) Weight: 105 lb 3.2 oz (47.718 kg) IBW/kg (Calculated) : 61.5    Vital Signs: Temp: 100.5 F (38.1 C) (04/07 1421) Temp Source: Oral (04/07 1421) BP: 169/115 mmHg (04/07 1421) Pulse Rate: 123 (04/07 1421) Intake/Output from previous day:   Intake/Output from this shift:    Labs:  Recent Labs  05/22/14 1126  WBC 9.7  HGB 12.9*  PLT 215  CREATININE 0.88   Estimated Creatinine Clearance: 69.3 mL/min (by C-G formula based on Cr of 0.88). No results for input(s): VANCOTROUGH, VANCOPEAK, VANCORANDOM, GENTTROUGH, GENTPEAK, GENTRANDOM, TOBRATROUGH, TOBRAPEAK, TOBRARND, AMIKACINPEAK, AMIKACINTROU, AMIKACIN in the last 72 hours.   Microbiology: No results found for this or any previous visit (from the past 720 hour(s)).  Medical History: Past Medical History  Diagnosis Date  . Alcohol abuse   . Hiccups   . HTN (hypertension)   . Alcohol related seizure     Medications:  Prescriptions prior to admission  Medication Sig Dispense Refill Last Dose  . amLODipine (NORVASC) 10 MG tablet Take 1 tablet (10 mg total) by mouth daily. 30 tablet 2 Unknown  . folic acid (FOLVITE) 1 MG tablet Take 1 tablet (1 mg total) by mouth daily. (Patient not taking: Reported on 04/15/2014) 30 tablet 6 Unknown  . Magnesium Chloride (MAG64) 535 (64 MG) MG TBCR Take 3 tablets (192 mg total) by mouth 2 (two) times daily. 180 tablet 0 Taking  . metroNIDAZOLE (FLAGYL) 500 MG tablet Take 500 mg by mouth every 8 (eight) hours.  0   . ondansetron (ZOFRAN-ODT) 8 MG disintegrating tablet Take 1 tablet (8 mg total) by mouth once. (Patient not taking: Reported on 05/22/2014) 20 tablet 0 Not Taking  . phenytoin (DILANTIN) 100 MG ER capsule Take 300 mg by mouth at bedtime.   2 Taking  . potassium chloride 20 MEQ TBCR Take 20 mEq by mouth daily. 30 tablet 2  Taking  . Tetrahydrozoline HCl (VISINE OP) Place 1 drop into both eyes daily as needed (dry eyes).   Not Taking  . thiamine (VITAMIN B-1) 100 MG tablet Take 1 tablet (100 mg total) by mouth daily. 30 tablet 6 Taking   Scheduled:  . folic acid  1 mg Oral Daily  . magnesium sulfate 1 - 4 g bolus IVPB  4 g Intravenous Once  . multivitamin with minerals  1 tablet Oral Daily  . sodium chloride  3 mL Intravenous Q12H  . thiamine  100 mg Oral Daily   Or  . thiamine  100 mg Intravenous Daily   Assessment: 49 yo male on phenytoin for history of seizures. Pharmacy has been consulted to dose po phenytoin. A phenytoin level is in process. SCr= 0.88 and CrCl ~ 70. Per patient no recent seizures noted.  Patient reports dose as 300mg  po qhs with last dose taken on 05/20/14.   Scr 0.8 Albumin 3.7 Phenytoin 16.2 (normal renal function and albumin - no level correction warranted)  Goal of Therapy:  Phenytoin level= 10-20  Plan:  -Continue 300mg  po qhs  -Will follow patient progress  Erin Hearing PharmD., BCPS Clinical Pharmacist Pager [phone] 05/22/2014 4:44 PM

## 2014-05-22 NOTE — Progress Notes (Signed)
MD paged for consecutive high BP reading to determine if home HTN meds can be restarted for patient. Patient asymptomatic, will continue to monitor closely.

## 2014-05-22 NOTE — Progress Notes (Signed)
Report called to [facility] on [address].  Patient transported via wheelchair to [address] Bed 3.  Alert and oriented.  Saline lock in left arm intact.  Sander Nephew, RN 05/22/2014 1:54 PM

## 2014-05-22 NOTE — Assessment & Plan Note (Addendum)
When questioned he still reports occasional dizziness when walking around and from seated to standing.  He describes feeling as if he is about to pass out and not the sensation of spinning.  He says it happens occasionally but every day.  Says it "does not get too bad."  He continues to drink 4 days out of the week.   He drinks 1 pint of gin on days he drinks.  He says he has been drinking for 20 years but increased his drinking 3 years ago.  He does not know why he increased his drinking at that time.  He last drank yesterday evening.  He does not relate the dizziness to drinking.  He denies recurrent seizure since last admission.  He sometimes feels he needs a drink in the AM but denies interference with personal or work life.  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  When asked if he wants to quit drinking he says "Yes and No."   He fell last week and hit his head on the bumper of a car he was working on.  He denies lightheadedness at that time but says the pressure from the machine he was using pushed back against him and caused him to fall.  He denies LOC or change in vision.  He says he was not drinking on the day this occurred. The pain at the back of his head is improving.  Today the patient appears in withdrawal - he is sweaty, tremulous, tachycardic and hypertensive.   - he is ambivalent about quitting despite being informed about the negative consequences of drinking upon his health and despite multiple admissions for EtOH related issues and withdrawal - admission to inpatient today for severe hypomagnesemia (Mg 0.8) and need for IV replacement given concern for cardiac and neuro consequences (especially given seizure hx) with this level of depletion - CIWA

## 2014-05-23 DIAGNOSIS — E876 Hypokalemia: Secondary | ICD-10-CM

## 2014-05-23 LAB — URINE MICROSCOPIC-ADD ON

## 2014-05-23 LAB — URINALYSIS, ROUTINE W REFLEX MICROSCOPIC
BILIRUBIN URINE: NEGATIVE
Glucose, UA: NEGATIVE mg/dL
Ketones, ur: NEGATIVE mg/dL
Nitrite: POSITIVE — AB
Protein, ur: NEGATIVE mg/dL
SPECIFIC GRAVITY, URINE: 1.014 (ref 1.005–1.030)
Urobilinogen, UA: 0.2 mg/dL (ref 0.0–1.0)
pH: 7.5 (ref 5.0–8.0)

## 2014-05-23 LAB — BASIC METABOLIC PANEL
ANION GAP: 14 (ref 5–15)
BUN: 7 mg/dL (ref 6–23)
CHLORIDE: 91 mmol/L — AB (ref 96–112)
CO2: 28 mmol/L (ref 19–32)
CREATININE: 0.67 mg/dL (ref 0.50–1.35)
Calcium: 8.5 mg/dL (ref 8.4–10.5)
GFR calc Af Amer: >90 mL/min (ref >90)
Glucose, Bld: 101 mg/dL — ABNORMAL HIGH (ref 70–99)
POTASSIUM: 3.1 mmol/L — AB (ref 3.5–5.1)
SODIUM: 133 mmol/L — AB (ref 135–145)

## 2014-05-23 LAB — MAGNESIUM: MAGNESIUM: 2 mg/dL (ref 1.5–2.5)

## 2014-05-23 MED ORDER — CLONIDINE HCL 0.1 MG/24HR TD PTWK
0.1000 mg | MEDICATED_PATCH | TRANSDERMAL | Status: DC
  Administered 2014-05-23: 0.1 mg via TRANSDERMAL
  Filled 2014-05-23: qty 1

## 2014-05-23 MED ORDER — POTASSIUM CHLORIDE CRYS ER 20 MEQ PO TBCR
40.0000 meq | EXTENDED_RELEASE_TABLET | Freq: Once | ORAL | Status: AC
  Administered 2014-05-23: 40 meq via ORAL
  Filled 2014-05-23: qty 2

## 2014-05-23 MED ORDER — METOPROLOL TARTRATE 25 MG PO TABS: 25.0000 mg | ORAL_TABLET | Freq: Two times a day (BID) | ORAL | Status: DC

## 2014-05-23 MED ORDER — ADULT MULTIVITAMIN W/MINERALS CH
1.0000 | ORAL_TABLET | Freq: Every day | ORAL | Status: DC
  Administered 2014-05-23 – 2014-05-24 (×2): 1 via ORAL
  Filled 2014-05-23 (×2): qty 1

## 2014-05-23 MED ORDER — METOPROLOL TARTRATE 1 MG/ML IV SOLN
5.0000 mg | Freq: Four times a day (QID) | INTRAVENOUS | Status: DC | PRN
  Administered 2014-05-24: 5 mg via INTRAVENOUS
  Filled 2014-05-23: qty 5

## 2014-05-23 MED ORDER — ENSURE ENLIVE PO LIQD
237.0000 mL | Freq: Two times a day (BID) | ORAL | Status: DC
  Administered 2014-05-23 – 2014-05-24 (×2): 237 mL via ORAL

## 2014-05-23 NOTE — Progress Notes (Signed)
UR completed 

## 2014-05-23 NOTE — Progress Notes (Signed)
Subjective:  Patient states that he is feeling better this morning with less tremulousness. He states that he is not yet willing to stop drinking alcohol, although he is willing to try to cut down from his 4 pints of alcohol a week to 2 pints.  Objective: Vital signs in last 24 hours: Filed Vitals:   05/23/14 1059 05/23/14 1101 05/23/14 1124 05/23/14 1125  BP: 160/135 144/92 131/107 138/78  Pulse:  128    Temp:      TempSrc:      Resp:      Height:      Weight:      SpO2:       Weight change:   Intake/Output Summary (Last 24 hours) at 05/23/14 1304 Last data filed at 05/23/14 1243  Gross per 24 hour  Intake   1158 ml  Output    750 ml  Net    408 ml    General: resting in bed, mild tremors, less diaphoresis compared to yesterday HEENT: PERRL, EOMI, no scleral icterus Cardiac: Tachycardic, regular rhythm, no rubs, murmurs or gallops Pulm: clear to auscultation bilaterally, moving normal volumes of air Abd: soft, nontender, nondistended, BS present Ext: warm and well perfused, no pedal edema Neuro: alert and oriented X3, cranial nerves II-XII grossly intact Skin: no rashes or lesions noted Psych: appropriate affect and cognition  Lab Results: Basic Metabolic Panel:  Recent Labs Lab 05/22/14 1126 05/22/14 1435 05/23/14 0411  NA 131*  --  133*  K 3.8  --  3.1*  CL 85*  --  91*  CO2 31  --  28  GLUCOSE 109*  --  101*  BUN 9  --  7  CREATININE 0.88  --  0.67  CALCIUM 8.8  --  8.5  MG 0.8*  --  2.0  PHOS  --  3.6  --    Liver Function Tests:  Recent Labs Lab 05/22/14 1435  AST 94*  ALT 26  ALKPHOS 148*  BILITOT 1.0  PROT 7.6  ALBUMIN 4.0  3.7   No results for input(s): LIPASE, AMYLASE in the last 168 hours. No results for input(s): AMMONIA in the last 168 hours. CBC:  Recent Labs Lab 05/22/14 1126  WBC 9.7  HGB 12.9*  HCT 36.3*  MCV 95.5  PLT 215   Cardiac Enzymes: No results for input(s): CKTOTAL, CKMB, CKMBINDEX, TROPONINI in the last  168 hours. BNP: No results for input(s): PROBNP in the last 168 hours. D-Dimer: No results for input(s): DDIMER in the last 168 hours. CBG: No results for input(s): GLUCAP in the last 168 hours. Hemoglobin A1C: No results for input(s): HGBA1C in the last 168 hours. Fasting Lipid Panel: No results for input(s): CHOL, HDL, LDLCALC, TRIG, CHOLHDL, LDLDIRECT in the last 168 hours. Thyroid Function Tests:  Recent Labs Lab 05/22/14 1126  TSH 1.082   Coagulation: No results for input(s): LABPROT, INR in the last 168 hours. Anemia Panel: No results for input(s): VITAMINB12, FOLATE, FERRITIN, TIBC, IRON, RETICCTPCT in the last 168 hours. Urine Drug Screen: Drugs of Abuse     Component Value Date/Time   LABOPIA NONE DETECTED 05/22/2014 1712   COCAINSCRNUR NONE DETECTED 05/22/2014 1712   LABBENZ NONE DETECTED 05/22/2014 1712   AMPHETMU NONE DETECTED 05/22/2014 1712   THCU NONE DETECTED 05/22/2014 1712   LABBARB NONE DETECTED 05/22/2014 1712    Alcohol Level:  Recent Labs Lab 05/22/14 [place] <5   Urinalysis: No results for input(s): COLORURINE, LABSPEC,  PHURINE, GLUCOSEU, HGBUR, BILIRUBINUR, KETONESUR, PROTEINUR, UROBILINOGEN, NITRITE, LEUKOCYTESUR in the last 168 hours.  Invalid input(s): APPERANCEUR  Micro Results: Recent Results (from the past 240 hour(s))  MRSA PCR Screening     Status: None   Collection Time: 05/22/14  3:19 PM  Result Value Ref Range Status   MRSA by PCR NEGATIVE NEGATIVE Final    Comment:        The GeneXpert MRSA Assay (FDA approved for NASAL specimens only), is one component of a comprehensive MRSA colonization surveillance program. It is not intended to diagnose MRSA infection nor to guide or monitor treatment for MRSA infections.    Studies/Results: Dg Chest 2 View  05/22/2014   CLINICAL DATA:  Tachycardia with shortness of breath earlier today. History of hypertension. Initial encounter.  EXAM: CHEST  2 VIEW  COMPARISON:  03/28/2014  and 12/17/2013.  FINDINGS: The heart size and mediastinal contours are stable. The lungs are clear. There is no pleural effusion or pneumothorax. There is prominent atherosclerosis of the carotid arteries bilaterally for age. Old fracture of the left eighth rib posteriorly noted.  IMPRESSION: 1. No active cardiopulmonary process. 2. Prominent atherosclerosis of the carotid arteries for age.   Electronically Signed   By: Richardean Sale M.D.   On: 05/22/2014 21:40   Medications: I have reviewed the patient's current medications. Scheduled Meds: . amLODipine  10 mg Oral Daily  . cloNIDine  0.1 mg Transdermal Weekly  . enoxaparin (LOVENOX) injection  40 mg Subcutaneous Q24H  . feeding supplement (ENSURE ENLIVE)  237 mL Oral BID BM  . folic acid  1 mg Oral Daily  . magnesium chloride  3 tablet Oral BID  . multivitamin with minerals  1 tablet Oral Daily  . nicotine  21 mg Transdermal Daily  . phenytoin  300 mg Oral QHS  . sodium chloride  3 mL Intravenous Q12H  . thiamine  100 mg Oral Daily   Or  . thiamine  100 mg Intravenous Daily   Continuous Infusions: . sodium chloride 75 mL/hr (05/23/14 1039)   PRN Meds:.LORazepam **OR** LORazepam, promethazine Assessment/Plan: Principal Problem:   Hypomagnesemia Active Problems:   Seizures   Essential hypertension, benign   Tobacco dependence   Alcohol withdrawal   Alcohol abuse   Hypokalemia   Orthostatic hypotension   Patient is a 49 year old gentleman with a history of alcohol abuse, seizures, tobacco abuse, hypertension who presents to the hospital from clinic in the setting of hypomagnesemia and tremulousness.   Alcohol withdrawal: Patient with moderate CIWA scores overnight requiring 3 mg of Ativan. No evidence of hallucinations or seizures.Patient seems to be improved this morning upon exam. Blood pressures are improved although patient is still tachycardic.  Patient has defervesced. Chest x-ray unremarkable for any concurrent sources of  infection. -Continue with CIWA protocol with Ativan when necessary -Continue with clonidine patch -Urinalysis pending  Hypomagnesemia: Resolved with a magnesium level of 2.0 this morning status post 4 g of IV magnesium repletion. Patient is on magnesium chloride 192 mg twice a day at home. The persistence of tachycardia and tremors status post repletion of magnesium more definitively argues for alcohol withdrawal being the etiology for the patient's symptoms. - continue with home regimen of magnesium chloride oral supplementation.  Hypokalemia: Potassium of 3.1 this morning. -K-Dur 40 mEq oral  Seizure disorder: Patient's phenytoin level was therapeutic at 16.2. Patient with no definitive episodes of seizure over the last several weeks. -Continue home phenytoin 300 mg daily at bedtime.  Hypertension:  Patient with persistently elevated blood pressures overnight with some improvement with Ativan, however clonidine patch was added this morning. Last blood pressure better controlled at 138/78. However, patient also exhibiting orthostatic hypotension that has somewhat improved since initial evaluation. -Continue with home amlodipine 10 mg daily. -Continue with clonidine patch 0.1 mg transdermal. -Continue normal saline at 75 mL per hour given persistent though improved orthostatic hypotension.     Diet: Regular Prophylaxis: Lovenox Code: Full  Dispo: Disposition is deferred at this time, awaiting improvement of current medical problems. Anticipated discharge in approximately 2 day(s).   The patient does have a current PCP Karlene Einstein, MD) and does need an [facility] hospital follow-up appointment after discharge.  The patient does not have transportation limitations that hinder transportation to clinic appointments.  Services Needed at time of discharge: Y = Yes, Blank = No PT:   OT:   RN:   Equipment:   Other:    Luan Moore, MD 05/23/2014, 1:04 PM

## 2014-05-23 NOTE — Progress Notes (Addendum)
INITIAL NUTRITION ASSESSMENT  DOCUMENTATION CODES Per approved criteria  -Severe malnutrition in the context of chronic illness -Underweight   INTERVENTION:  Ensure Enlive PO BID, each supplement provides 350 kcal and 20 grams of protein.  Continue MVI daily.  NUTRITION DIAGNOSIS: Malnutrition related to chronic alcohol abuse as evidenced by severe depletion of muscle and subcutaneous fat mass.   Goal: Intake to meet >90% of estimated nutrition needs.  Monitor:  PO intake, labs, weight trend.  Reason for Assessment: Malnutrition Screening Tool  49 y.o. male  Admitting Dx: Hypomagnesemia  ASSESSMENT: Patient admitted on 4/7 with dizziness. Found to have hypomagnesemia. Hx of ongoing ETOH abuse. He drinks a pint of liquor in a day, 4 days per week.  Patient is underweight with BMI=17.5. He is tolerating a regular diet well. Ate all of his breakfast this AM. Agreed to try Ensure Enlive supplements between meals to maximize oral intake. He says that he eats regular meals at home.   Sodium and potassium are low, magnesium now WNL after IV and PO repletion.  Nutrition Focused Physical Exam:  Subcutaneous Fat:  Orbital Region: WNL Upper Arm Region: severe depletion Thoracic and Lumbar Region: N/A  Muscle:  Temple Region: WNL Clavicle Bone Region: moderate depletion Clavicle and Acromion Bone Region: moderate depletion Scapular Bone Region: N/A Dorsal Hand: moderate depletion Patellar Region: severe depletion Anterior Thigh Region: severe depletion Posterior Calf Region: severe depletion  Edema: none   Height: Ht Readings from Last 1 Encounters:  05/22/14 5\' 5"  (1.651 m)    Weight: Wt Readings from Last 1 Encounters:  05/22/14 105 lb 3.2 oz (47.718 kg)    Ideal Body Weight: 61.8 kg  % Ideal Body Weight: 77%  Wt Readings from Last 10 Encounters:  05/22/14 105 lb 3.2 oz (47.718 kg)  05/22/14 107 lb 4.8 oz (48.671 kg)  04/02/14 110 lb (49.896 kg)   03/28/14 110 lb 7.2 oz (50.1 kg)  03/28/14 110 lb (49.896 kg)  03/21/14 112 lb 3.2 oz (50.894 kg)  03/10/14 106 lb 0.7 oz (48.1 kg)  12/18/13 107 lb 9.4 oz (48.8 kg)  10/28/13 112 lb (50.803 kg)  08/15/13 115 lb (52.164 kg)    Usual Body Weight: 112 lbs 2 months ago  % Usual Body Weight: 94%  BMI:  Body mass index is 17.51 kg/(m^2). Underweight.  Estimated Nutritional Needs: Kcal: 1700-1900 Protein: 80-95 gm Fluid: 1.8 L  Skin: intact  Diet Order: Diet regular Room service appropriate?: Yes; Fluid consistency:: Thin  EDUCATION NEEDS: -Education needs addressed   Intake/Output Summary (Last 24 hours) at 05/23/14 1153 Last data filed at 05/23/14 1042  Gross per 24 hour  Intake    618 ml  Output    750 ml  Net   -132 ml    Last BM: 4/7   Labs:   Recent Labs Lab 05/22/14 1126 05/22/14 1435 05/23/14 0411  NA 131*  --  133*  K 3.8  --  3.1*  CL 85*  --  91*  CO2 31  --  28  BUN 9  --  7  CREATININE 0.88  --  0.67  CALCIUM 8.8  --  8.5  MG 0.8*  --  2.0  PHOS  --  3.6  --   GLUCOSE 109*  --  101*    CBG (last 3)  No results for input(s): GLUCAP in the last 72 hours.  Scheduled Meds: . amLODipine  10 mg Oral Daily  . cloNIDine  0.1 mg Transdermal  Weekly  . enoxaparin (LOVENOX) injection  40 mg Subcutaneous Q24H  . folic acid  1 mg Oral Daily  . magnesium chloride  3 tablet Oral BID  . multivitamin with minerals  1 tablet Oral Daily  . nicotine  21 mg Transdermal Daily  . phenytoin  300 mg Oral QHS  . sodium chloride  3 mL Intravenous Q12H  . thiamine  100 mg Oral Daily   Or  . thiamine  100 mg Intravenous Daily    Continuous Infusions: . sodium chloride 75 mL/hr (05/23/14 1039)    Past Medical History  Diagnosis Date  . Alcohol abuse   . Hiccups   . HTN (hypertension)   . Alcohol related seizure     Past Surgical History  Procedure Laterality Date  . No past surgeries      Molli Barrows, RD, LDN, [place] Pager [phone] After  Hours Pager [phone]

## 2014-05-24 ENCOUNTER — Encounter (HOSPITAL_COMMUNITY): Payer: Self-pay | Admitting: *Deleted

## 2014-05-24 LAB — COMPREHENSIVE METABOLIC PANEL
ALK PHOS: 125 U/L — AB (ref 39–117)
ALT: 22 U/L (ref 0–53)
ANION GAP: 4 — AB (ref 5–15)
AST: 84 U/L — AB (ref 0–37)
Albumin: 3.3 g/dL — ABNORMAL LOW (ref 3.5–5.2)
BILIRUBIN TOTAL: 0.7 mg/dL (ref 0.3–1.2)
BUN: 5 mg/dL — ABNORMAL LOW (ref 6–23)
CHLORIDE: 98 mmol/L (ref 96–112)
CO2: 30 mmol/L (ref 19–32)
CREATININE: 0.63 mg/dL (ref 0.50–1.35)
Calcium: 8.6 mg/dL (ref 8.4–10.5)
GFR calc non Af Amer: >90 mL/min (ref >90)
Glucose, Bld: 97 mg/dL (ref 70–99)
Potassium: 3.5 mmol/L (ref 3.5–5.1)
SODIUM: 132 mmol/L — AB (ref 135–145)
Total Protein: 6 g/dL (ref 6.0–8.3)

## 2014-05-24 LAB — MAGNESIUM: Magnesium: 1.2 mg/dL — ABNORMAL LOW (ref 1.5–2.5)

## 2014-05-24 MED ORDER — MAGNESIUM SULFATE 2 GM/50ML IV SOLN
2.0000 g | Freq: Once | INTRAVENOUS | Status: AC
  Administered 2014-05-24: 2 g via INTRAVENOUS
  Filled 2014-05-24: qty 50

## 2014-05-24 MED ORDER — LISINOPRIL 5 MG PO TABS
5.0000 mg | ORAL_TABLET | Freq: Every day | ORAL | Status: DC
  Administered 2014-05-24: 5 mg via ORAL
  Filled 2014-05-24: qty 1

## 2014-05-24 MED ORDER — POTASSIUM CHLORIDE CRYS ER 20 MEQ PO TBCR
40.0000 meq | EXTENDED_RELEASE_TABLET | Freq: Once | ORAL | Status: AC
  Administered 2014-05-24: 40 meq via ORAL
  Filled 2014-05-24: qty 2

## 2014-05-24 MED ORDER — SODIUM CHLORIDE 0.9 % IV SOLN
INTRAVENOUS | Status: DC
  Administered 2014-05-24: 21:00:00 via INTRAVENOUS
  Administered 2014-05-24: 100 mL/h via INTRAVENOUS

## 2014-05-24 MED ORDER — MAGNESIUM CHLORIDE 64 MG PO TBEC
3.0000 | DELAYED_RELEASE_TABLET | Freq: Three times a day (TID) | ORAL | Status: DC
  Administered 2014-05-24 (×2): 192 mg via ORAL
  Filled 2014-05-24 (×5): qty 3

## 2014-05-24 MED ORDER — CETYLPYRIDINIUM CHLORIDE 0.05 % MT LIQD
7.0000 mL | Freq: Two times a day (BID) | OROMUCOSAL | Status: DC
  Administered 2014-05-24 (×2): 7 mL via OROMUCOSAL

## 2014-05-24 MED ORDER — METOPROLOL TARTRATE 1 MG/ML IV SOLN: 5.0000 mg | Freq: Once | INTRAVENOUS | Status: DC

## 2014-05-24 NOTE — Progress Notes (Signed)
Subjective:  Pt seen and examined in AM. No acute events overnight. He had CIWA score of 3-6 yesterday. His heart rate and blood pressure have normalized. He denies tremors, palpitations, chest pain, or diaphoresis. He has not had any seizure activity during hospitalization. He will try to cut back on alcohol use. He feels ready to go home today.    Objective: Vital signs in last 24 hours: Filed Vitals:   05/24/14 0300 05/24/14 0400 05/24/14 0729 05/24/14 1015  BP: 144/90 154/91 144/97 132/87  Pulse:  107 93   Temp:  98.5 F (36.9 C)    TempSrc:  Oral    Resp: 19 19 14    Height:      Weight:  102 lb 4.7 oz (46.4 kg)    SpO2:  98%     Weight change: -2 lb 14.5 oz (-1.318 kg)  Intake/Output Summary (Last 24 hours) at 05/24/14 1154 Last data filed at 05/24/14 0901  Gross per 24 hour  Intake 2873.75 ml  Output   1050 ml  Net 1823.75 ml   PHYSICAL EXAMINATION:  General: NAD Heart: Normal rate and rhythm. No murmur  Lungs: Clear to ausculation with no wheezing, ronchi, or rales Abdomen: Soft, non-tender, non-distended, normal BS Extremities: No edema  Lab Results: Basic Metabolic Panel:  Recent Labs Lab 05/22/14 1435 05/23/14 0411 05/24/14 0331  NA  --  133* 132*  K  --  3.1* 3.5  CL  --  91* 98  CO2  --  28 30  GLUCOSE  --  101* 97  BUN  --  7 5*  CREATININE  --  0.67 0.63  CALCIUM  --  8.5 8.6  MG  --  2.0 1.2*  PHOS 3.6  --   --    Liver Function Tests:  Recent Labs Lab 05/22/14 1435 05/24/14 0331  AST 94* 84*  ALT 26 22  ALKPHOS 148* 125*  BILITOT 1.0 0.7  PROT 7.6 6.0  ALBUMIN 4.0  3.7 3.3*   CBC:  Recent Labs Lab 05/22/14 1126  WBC 9.7  HGB 12.9*  HCT 36.3*  MCV 95.5  PLT 215   Thyroid Function Tests:  Recent Labs Lab 05/22/14 1126  TSH 1.082   Urine Drug Screen: Drugs of Abuse     Component Value Date/Time   LABOPIA NONE DETECTED 05/22/2014 1712   COCAINSCRNUR NONE DETECTED 05/22/2014 1712   LABBENZ NONE DETECTED  05/22/2014 1712   AMPHETMU NONE DETECTED 05/22/2014 1712   THCU NONE DETECTED 05/22/2014 1712   LABBARB NONE DETECTED 05/22/2014 1712    Alcohol Level:  Recent Labs Lab 05/22/14 1615  [place] <5   Urinalysis:  Recent Labs Lab 05/23/14 2034  COLORURINE YELLOW  LABSPEC 1.014  PHURINE 7.5  GLUCOSEU NEGATIVE  HGBUR MODERATE*  BILIRUBINUR NEGATIVE  KETONESUR NEGATIVE  PROTEINUR NEGATIVE  UROBILINOGEN 0.2  NITRITE POSITIVE*  LEUKOCYTESUR SMALL*    Micro Results: Recent Results (from the past 240 hour(s))  MRSA PCR Screening     Status: None   Collection Time: 05/22/14  3:19 PM  Result Value Ref Range Status   MRSA by PCR NEGATIVE NEGATIVE Final    Comment:        The GeneXpert MRSA Assay (FDA approved for NASAL specimens only), is one component of a comprehensive MRSA colonization surveillance program. It is not intended to diagnose MRSA infection nor to guide or monitor treatment for MRSA infections.    Studies/Results: Dg Chest 2 View  05/22/2014  CLINICAL DATA:  Tachycardia with shortness of breath earlier today. History of hypertension. Initial encounter.  EXAM: CHEST  2 VIEW  COMPARISON:  03/28/2014 and 12/17/2013.  FINDINGS: The heart size and mediastinal contours are stable. The lungs are clear. There is no pleural effusion or pneumothorax. There is prominent atherosclerosis of the carotid arteries bilaterally for age. Old fracture of the left eighth rib posteriorly noted.  IMPRESSION: 1. No active cardiopulmonary process. 2. Prominent atherosclerosis of the carotid arteries for age.   Electronically Signed   By: Richardean Sale M.D.   On: 05/22/2014 21:40   Medications: I have reviewed the patient's current medications. Scheduled Meds: . amLODipine  10 mg Oral Daily  . antiseptic oral rinse  7 mL Mouth Rinse BID  . cloNIDine  0.1 mg Transdermal Weekly  . enoxaparin (LOVENOX) injection  40 mg Subcutaneous Q24H  . feeding supplement (ENSURE ENLIVE)  237 mL Oral  BID BM  . folic acid  1 mg Oral Daily  . magnesium chloride  3 tablet Oral BID  . metoprolol  5 mg Intravenous Once  . multivitamin with minerals  1 tablet Oral Daily  . nicotine  21 mg Transdermal Daily  . phenytoin  300 mg Oral QHS  . sodium chloride  3 mL Intravenous Q12H  . thiamine  100 mg Oral Daily   Or  . thiamine  100 mg Intravenous Daily   Continuous Infusions:  PRN Meds:.LORazepam **OR** LORazepam, metoprolol, promethazine Assessment/Plan: Principal Problem:   Hypomagnesemia Active Problems:   Seizures   Essential hypertension, benign   Tobacco dependence   Alcohol withdrawal   Alcohol abuse   Hypokalemia   Orthostatic hypotension  Alcohol Withdrawal in setting of chronic alcohol abuse - Last CIWA score 5 requiring Ativan x 2 yesterday and once today, currently asymptomatic.   -Pt counseled on alcohol withdrawal  -Continue clonidine patch 0.1 mg for 1 week duration  Hypomagnesemia - Mg level 1.2 this AM. K 3.5 in setting of chronic alcohol abuse. -Administer IV magnesium sulfate 2 g  X once and kdur 40 mEq x  once -Increase home magnesium 192 mg from BID to TID -Pt needs outpatient follow-up next week to recheck levels   Hypertension - Currently normotensive s/p IV lopressor 5 mg x 1 this AM. Pt's orthostatic hypotension on admission has  improved with IVF's. -Discontinue IV fluids -Continue home amlodipine 10 mg daily -Continue clonidine patch 0.1 mg  for 1 week duration -Pt needs outpatient follow-up next week for bp recheck and starting of additional agent (lisinopril) once off clonidine patch. HCTZ not good choice in setting of hypomagnesemia/hypokalmiea and orthostatic hypotension.  Seizure Disorder - Pt with therapeutic phenytoin level at 16.2. Pt with no seizure activity during hospitalization. Last seizure over Easter weekend.  -Continue home phenytoin 300 mg daily at bedtime  Diet: Regular DVT PPx: Lovenox  Code: Full     Dispo: Disposition is  deferred at this time, awaiting improvement of current medical problems.  Anticipated discharge is today.    The patient does have a current PCP Karlene Einstein, MD) and does need an [facility] hospital follow-up appointment after discharge.  The patient does not have transportation limitations that hinder transportation to clinic appointments.  .Services Needed at time of discharge: Y = Yes, Blank = No PT:  No  OT:  No  RN:  No  Equipment:  None   Other:  None      Juluis Mire, MD 05/24/2014, 11:54 AM

## 2014-05-24 NOTE — Progress Notes (Signed)
UR completed 

## 2014-05-25 MED ORDER — LORAZEPAM 2 MG/ML IJ SOLN
0.0000 mg | Freq: Four times a day (QID) | INTRAMUSCULAR | Status: DC
Start: 2014-05-25 — End: 2014-05-25

## 2014-05-25 MED ORDER — LORAZEPAM 2 MG/ML IJ SOLN
2.0000 mg | Freq: Once | INTRAMUSCULAR | Status: AC
  Administered 2014-05-25: 2 mg via INTRAVENOUS

## 2014-05-25 MED ORDER — LORAZEPAM 2 MG/ML IJ SOLN
0.0000 mg | Freq: Two times a day (BID) | INTRAMUSCULAR | Status: DC
Start: 2014-05-27 — End: 2014-05-25

## 2014-05-25 NOTE — Progress Notes (Addendum)
Pt discharged himself AMA.  IV was discontinued and telemetry monitor removed.  Pt left wearing his jacket and with all of his belongings after signing AMA form.  Pt Sister was notified by telephone that the Pt was leaving AMA.  Stated "she would pick Pt up at 5:30am from hospital"  Pt was made aware and declined to wait for her on the unit.

## 2014-05-25 NOTE — Progress Notes (Signed)
Pt IV was reestablished and IV ativan was administered.  Pt was able to be redirected back into bed.

## 2014-05-25 NOTE — Progress Notes (Signed)
Pt became agitated and restless.  Was wandering in halls and "looking for the exit" after removing his telemetry box and disconnecting his IV.  MD was notified and Security responded to redirect the Pt back to his bed.

## 2014-05-25 NOTE — Progress Notes (Signed)
Pt became restless and began walking the hall with staff claiming he "was going to the shed for a cigarette".  Pt was redirected again to return to bed.

## 2014-05-25 NOTE — Progress Notes (Addendum)
[facility] Night Float Progress Note  Paged multiple times regarding the patient attempting to leave [facility]. Patient initially refusing administration of medications. Patient not fully discussing the reasons for this refusal or his reasons for wanting to leave at this hour. Security called. After discussion with patient multiple times, patient is agreeable to staying in his room and receiving more medications. In addition to CIWA protocol, additional 2 mg of Ativan were administered. As patient was attempting to leave and walk home by foot, the consequences of leaving the hospital including the risk of injury or death were thoroughly explained to the patient. Patient was alert and oriented and had decision making capacity.   Luan Moore, MD, PhD 4:46 AM

## 2014-05-25 NOTE — Progress Notes (Signed)
MD evaluated patient and ordered 2mg  Ativan IV.  Ativan was administered and Pt returned to his bed

## 2014-05-25 NOTE — Progress Notes (Signed)
Pt up in hall walking around, took cardiac monitor off and refuses to wear it, wanting to leave, nurse walking on unit with him for safety, teaching service paged. [facility] [employer]

## 2014-05-25 NOTE — Progress Notes (Signed)
MD was made aware of Pt decision to leave AMA.

## 2014-05-25 NOTE — Progress Notes (Signed)
Pt was demonstrating agitation, diaphoresis, and restlessness.  Commented that he planned to leave AMA.  He removed his IV and began to pace in the halls with staff.  Physician was called and advised to reestablish IV and administer IV ativan 1mg .

## 2014-05-25 NOTE — Progress Notes (Signed)
[facility] Night Float Progress Note  I have been notified that patient has left AMA. His sister, Lelon Perla had reported to nursing that she was on her way to pick up her brother.

## 2014-05-26 NOTE — Progress Notes (Signed)
Case discussed with Dr. Wilson at the time of the visit.  We reviewed the resident's history and exam and pertinent patient test results.  I agree with the assessment, diagnosis, and plan of care documented in the resident's note. 

## 2014-05-27 NOTE — Discharge Summary (Signed)
PATIENT LEFT AGAINST MEDICAL ADVICE   Name: Joseph Underwood MRN: [id number] DOB: 1965/08/27 49 y.o. PCP: Joseph Einstein, MD  Date of Admission: 4/7/Underwood  2:15 PM Date of Discharge: 4/10/Underwood Attending Physician: Joseph Stakes, MD  Discharge Diagnosis:  Hypomagnesemia Hypokalemia Alcohol Withdrawal in setting of chronic alcohol abuse Orthostatic Hypotension Sinus Tachycardia Hypertension  Seizure Disorder  Hepatic Hemangioma DVT Prophylaxis      Discharge Medications:   Medication List    ASK your doctor about these medications        amLODipine 10 MG tablet  Commonly known as:  NORVASC  Take 1 tablet (10 mg total) by mouth daily.     Magnesium Chloride 535 (64 MG) MG Tbcr  Commonly known as:  MAG64  Take 3 tablets (192 mg total) by mouth 2 (two) times daily.     phenytoin 100 MG ER capsule  Commonly known as:  DILANTIN  Take 300 mg by mouth at bedtime.     Potassium Chloride ER 20 MEQ Tbcr  Take 20 mEq by mouth daily.     thiamine 100 MG tablet  Commonly known as:  VITAMIN B-1  Take 1 tablet (100 mg total) by mouth daily.     VISINE OP  Place 1 drop into both eyes daily as needed (dry eyes).        Disposition and follow-up:   Joseph Underwood LEFT AMA from from [facility]. At the hospital follow up visit please address:  1.  Hypomagnesemia - His home magnesium chloride was increased from BID to TID due to low levels on admission. Assess compliance and recheck levels.         Hypertension - Pt had weekly clonidine patch placed on 4/8. Add lisinopril if still elevated (avoid HCTZ in setting of orthostatic hypotension).        Chronic Orthostatic Hypotension - Encourage adequate volume intake and cut back on alcohol use          Alcohol Abuse - Encourage to cut back/cessation          Seizure disorder- Needs referral back to neurology to manage AED         Hepatic Hemangioma - Needs RUQ US abdomen  Joseph Underwood to ensure stability    2.  Labs / imaging needed at time of follow-up: Mg, US abdomen Joseph Underwood to ensure hepatic hemangioma stable.   3.  Pending labs/ test needing follow-up: None  Follow-up Appointments: [facility] clinic will call pt with appt     Discharge Instructions:   Consultations: None  Procedures Performed:  Dg Chest 2 View  4/7/Underwood   CLINICAL DATA:  Tachycardia with shortness of breath earlier today. History of hypertension. Initial encounter.  EXAM: CHEST  2 VIEW  COMPARISON:  02/12/Underwood and 12/17/2013.  FINDINGS: The heart size and mediastinal contours are stable. The lungs are clear. There is no pleural effusion or pneumothorax. There is prominent atherosclerosis of the carotid arteries bilaterally for age. Old fracture of the left eighth rib posteriorly noted.  IMPRESSION: 1. No active cardiopulmonary process. 2. Prominent atherosclerosis of the carotid arteries for age.   Electronically Signed   By: Joseph Underwood M.D.   On: 04/07/Underwood 21:40     Admission HPI: Original Author Joseph Moore, MD   Patient  is a 49 year old gentleman with a history of alcohol abuse, seizures, tobacco abuse, hypertension who presents to the hospital from clinic in the setting of hypomagnesemia and tremulousness. Upon interview with the patient, patient states that he has been feeling dizziness and lightheaded over the last day. He states that he does not have a sensation that the room is spinning around him. Patient does not remember really whether he has passed out at any point. Patient states that his last drink of alcohol was yesterday evening. He states that he has been compliant with his magnesium supplementation at home. Patient's mother stated that patient may have had a seizure around [place] weekend. However, patient does not have a clear memory of what may have happened during that time. He denies having lost consciousness during that time period. Overall, he states that his  compliant with his medications including his seizure medication.  Patient was admitted for similar symptoms from 02/12/Underwood to 02/14/Underwood. Patient did not have any seizures during that admission and his overall course was uncomplicated only requiring 1 mg of Ativan before discharge.  He states that he has not thought about alcohol cessation yet. He is currently a half pack a day smoker and has a 37-pack-year history. Patient denies any use of illicit drugs.  Hospital Course by problem list:   Hypomagnesemia - Pt with magnesium level of 0.8 on admission in setting of chronic alcohol abuse that improved with IV and PO magnesium replacement.  Last Mg level was 1.2 and left AMA before AM labs could be drawn. Pt's home magnesium chloride was increased from 192 mg BID to TID. Pt needs outpatient follow-up to ensure levels are normal.      Hypokalemia - Pt with potassium level of 3.1 in setting of hypomagnesemia that normalized with repletion.    Alcohol Withdrawal in setting of chronic alcohol abuse - Ethanol level was negative. AST was elevated at 94 (at baseline). Pt with CIWA score of 0-20 during hospitalization requiring 8 mg total of Ativan.  Pt was counseled on alcohol cessation. He received clonidine patch 0.1 mg for 1 week  duration that was placed on 4/8 during hospitalization. Pt was continued on home thiamine, folate, and mutivitamin during hospitalization.   Orthostatic Hypotension - Pt with history of orthostatic hypotension with vitals on admission 188/120 (standing) to 140/104 (lying) that resolved with IVF administration to 131/84 (lying) to 114/79 (standing). Etiology likely volume depletion from chronic alcohol abuse. Pt was encouraged to increase volume intake and cut back on alcohol use. Pt is not on diuretic therapy and denies GI losses.   Sinus Tachycardia - Pt with HR of 78-128 during hospitalization in setting of alcohol withdrawal. TSH was normal on 05/22/14. EKG revealed sinus  tachycardia and prolonged QTc of 453 (chronic). He did not have evidence of infectious source. Chest xray was normal.     Hypertension - Pt with blood pressure range of 129/95 to 173/83 during hospitalization. Pt was continued on home amlodipine 10 mg daily. He received clonidine patch 0.1 mg for 1 week duration placed on 4/8 in setting of alcohol withdrawal. He was also started on lisinopril 5 mg daily which he was not prescribed since he left AMA. He will have outpatient follow-up for bp recheck and starting of additional agent (lisinopril) once off clonidine patch. HCTZ not good choice in setting of volume depletion from chronic alcohol abuse.   Seizure Disorder - Pt with therapeutic total phenytoin level at 16.2. Pt with no seizure activity during  hospitalization. Pt reported last seizure activity was over [place] weekend. He received home phenytoin 300 mg daily at bedtime during hospitalization with no seizure activity during hospitalization. Pt does not currently have a neurologist and needs referral from PCP to manage his AED.   Chronic Normocytic Anemia - Hg was stable at 12.9 at baseline 12 with no active bleeding. Last anemia panel on 03/22/14 consistent with anemia of chronic disease (liver diease) with iron overload (Tsat 92% and iron 308). Iron should be avoided.  Hepatic Hemangioma - RUQ [place] on 03/28/14 with hemangioma. He needs follow-up limited US in Joseph Underwood to ensure stability.   DVT Prophylaxis - Pt received lovenox during hospitalization with no evidence of DVT.      Discharge Vitals:   BP 173/83 mmHg  Pulse 78  Temp(Src) 98.6 F (37 C) (Oral)  Resp 18  Ht 5\' 5"  (1.651 m)  Wt 103 lb 2.4 oz (46.788 kg)  BMI 17.16 kg/m2  SpO2 100%  Discharge Labs:  No results found for this or any previous visit (from the past 24 hour(s)).  Signed: Juluis Mire, MD 4/12/Underwood, 3:25 PM    Services Ordered on Discharge: Pt left AMA Equipment Ordered on Discharge: Pt left AMA

## 2014-06-13 ENCOUNTER — Telehealth: Payer: Self-pay | Admitting: *Deleted

## 2014-06-13 ENCOUNTER — Encounter: Payer: Self-pay | Admitting: Internal Medicine

## 2014-06-13 ENCOUNTER — Ambulatory Visit (INDEPENDENT_AMBULATORY_CARE_PROVIDER_SITE_OTHER): Payer: Self-pay | Admitting: Internal Medicine

## 2014-06-13 VITALS — BP 165/93 | HR 113 | Temp 98.1°F

## 2014-06-13 DIAGNOSIS — I10 Essential (primary) hypertension: Secondary | ICD-10-CM

## 2014-06-13 DIAGNOSIS — F17210 Nicotine dependence, cigarettes, uncomplicated: Secondary | ICD-10-CM

## 2014-06-13 DIAGNOSIS — R2681 Unsteadiness on feet: Secondary | ICD-10-CM

## 2014-06-13 DIAGNOSIS — F1029 Alcohol dependence with unspecified alcohol-induced disorder: Secondary | ICD-10-CM

## 2014-06-13 DIAGNOSIS — F1020 Alcohol dependence, uncomplicated: Secondary | ICD-10-CM

## 2014-06-13 LAB — BASIC METABOLIC PANEL WITH GFR
BUN: 11 mg/dL (ref 6–23)
CHLORIDE: 98 meq/L (ref 96–112)
CO2: 24 mEq/L (ref 19–32)
Calcium: 9.2 mg/dL (ref 8.4–10.5)
Creat: 0.65 mg/dL (ref 0.50–1.35)
GLUCOSE: 71 mg/dL (ref 70–99)
Potassium: 4.5 mEq/L (ref 3.5–5.3)
Sodium: 139 mEq/L (ref 135–145)

## 2014-06-13 LAB — MAGNESIUM: Magnesium: 1.3 mg/dL — ABNORMAL LOW (ref 1.5–2.5)

## 2014-06-13 MED ORDER — LISINOPRIL 5 MG PO TABS: 5.0000 mg | ORAL_TABLET | Freq: Every day | ORAL | Status: DC

## 2014-06-13 NOTE — Progress Notes (Signed)
Patient ID: Joseph Underwood, male   DOB: [birth year], 49 y.o.   MRN: [id number]   Subjective:   HPI: [name] is a 49 y.o. gentleman with past medical history below presents for an acute visit due to gait instability and falls.  Reason(s) for visit:  Gait instability: Patient and his mother report that he has been having increasing in balance with ambulation and frequent falls. He fell 6 times just in the past 1 week.  He has been increasingly wobbly. She has had some minor injuries to the head with the above falls. He denies any focal weakness in any of his extremities, no vertigo or dizziness. However, he reports that he had one episode when a cigarette for out of his hand. He has not passed out and he denies confusion. No visual or auditory alterations.  Patient was discharged from the hospital on 05/25/2014 where he was admitted for orthostatic hypotension. His blood pressure however was elevated and was started on amlodipine 10 mg daily which he has been compliant with. HCTZ was avoided given his orthostatic hypotension and questionable dehydration. He also has a problem all excessive alcohol ingestion. He states that he drinks 1 pint of gin daily with his last drink being today in the morning and just before he presented to his clinic appointment. He denies recent seizures. He takes Dilantin and he has been compliant. He has no past medical history of stroke. His most recent brain CT scan was in January 2016 and this revealed mild cerebral atrophy but without any acute abnormalities. He denies increasing fever, chills loss of appetite. He has no urinary symptoms.   Past Medical History  Diagnosis Date  . Alcohol abuse   . Hiccups   . HTN (hypertension)   . Alcohol related seizure     ROS: Constitutional:  Denies fevers, chills, diaphoresis, appetite change and fatigue.  Respiratory: Denies SOB, DOE, cough, chest tightness, and wheezing.  CVS: No chest pain, palpitations and leg  swelling.  GI: No abdominal pain, nausea, vomiting, bloody stools GU: No dysuria, frequency, hematuria, or flank pain.  MSK: No myalgias, back pain, joint swelling, arthralgias  Psych: No depression symptoms. No SI or SA.    Objective:  Physical Exam: Filed Vitals:   06/13/14 1335  BP: 165/93  Pulse: 113  Temp: 98.1 F (36.7 C)  TempSrc: Oral  SpO2: 97%   General:  Appears malnourished. No acute distress.  Mother in the room HEENT: Normal oral mucosa. MMM.  Lungs: CTA bilaterally. No wheezing. Heart: RRR; no extra sounds or murmurs  Abdomen: Non-distended, normal bowel sounds, soft, nontender; no hepatosplenomegaly  Extremities: No pedal edema. No joint swelling or tenderness.  Neurologic Exam:   Mental Status: Alert, oriented, thought content appropriate.  Speech fluent without evidence of aphasia. Able to follow 3 step commands without difficulty.  Cranial Nerves:   II: Visual fields grossly intact. He wares glasses  III/IV/VI: Extraocular movements intact.  Pupils reactive bilaterally.  V/VII: Smile symmetric. Facial light touch sensation normal bilaterally.  VIII: Grossly intact.  XI: Bilateral shoulder shrug normal.  XII: Midline tongue extension normal.  Motor:  5/5 bilaterally with normal tone and bulk  Sensory:  Pinprick and light touch intact throughout, bilaterally  DTRs: 2+ and symmetric throughout  Plantars:  Downgoing bilaterally  Cerebellar: Normal finger-to-nose, normal rapid alternating movements. Heel to shin not tested. Unsteady gait. Patient requires holding himself to objects. Wide based gait.     Assessment & Plan:  Discussed  case with Dr Lynnae January See problem based charting for assessment and plan.

## 2014-06-13 NOTE — Telephone Encounter (Signed)
Scheduled appt for today, pt has been per his mother very "staggery" for 2 weeks

## 2014-06-13 NOTE — Progress Notes (Signed)
Talked with pt - prefers to to sch MRI Br at this time. Has no insurance. Mother  And Dr Alice Rieger aware. Hilda Blades Bhumi Godbey RN 06/13/14 2:45PM

## 2014-06-13 NOTE — Assessment & Plan Note (Addendum)
Assessment: Most likely etiology of his gait instability is alcohol intoxication. I wonder whether he has component of Wernicke's encephalopathy. He does have both horizontal and vertical nystagmus but visual fields and extraocular movements are normal. Cerebellar testing with rapid hand movement and nose to finger pointing is normal.  His gait is very unstable wide-based and requires support on objects in order to walk. Orthostatics are normal. In the differential, posterior circulation compromise has to be included given rapid deterioration of his gait. I do not suspect a stroke at this point as his neurologic exam is otherwise non focal besides gait instability. Patient is already on thiamine oral supplementation and reports compliancy.   Plan: 1. Labs/imaging:  Recommended brain MRI rule out other etiologies in this patient even though alcohol intoxicationseems to be the most likely etiology.  However, patient may have trouble affording brain MRI, as he is uninsured at this point. 2. Therapy:  Counseled the patient on alcohol cessation. He is not ready to quit alcohol. I reassured him that it will continue to help him with all the resources that he needs to stop alcohol as it's the most likely cause of his gait problems. Patient verbalizes understanding. Patient has capacity. 3. Follow up:  Follow-up as needed once we receive  Brain MRI results if we are able to do it.

## 2014-06-13 NOTE — Assessment & Plan Note (Signed)
BP Readings from Last 3 Encounters:  06/13/14 165/93  05/25/14 173/83  05/22/14 172/115    Lab Results  Component Value Date   NA 132* 05/24/2014   K 3.5 05/24/2014   CREATININE 0.63 05/24/2014    Assessment: Blood pressure control: moderately elevated Progress toward BP goal:    Comments:  Amlodipine 10 mg daily.  Plan: Medications:  Add Lisinopril 5 mg daily. Educational resources provided: brochure, handout, video Self management tools provided:   Other plans: BMP today and follow up in 2 weeks

## 2014-06-13 NOTE — Patient Instructions (Addendum)
General Instructions: I will check some labs today I will add Lisinopril 5 mg daily to your blood pressure treatment i believe your symptoms are due to alcohol. YOU NEED TO STOP DRINKING ALCOHOL We will arrange for MRI of the brain to see if there is any abnormalities Please come back if your symptoms worsen  Please bring your medicines with you each time you come to clinic.  Medicines may include prescription medications, over-the-counter medications, herbal remedies, eye drops, vitamins, or other pills.   Progress Toward Treatment Goals:  Treatment Goal 06/13/2014  Blood pressure -  Stop smoking smoking the same amount  Prevent falls -    Self Care Goals & Plans:  Self Care Goal 06/13/2014  Manage my medications take my medicines as prescribed; bring my medications to every visit; refill my medications on time; follow the sick day instructions if I am sick  Monitor my health -  Eat healthy foods eat more vegetables; eat fruit for snacks and desserts; eat smaller portions; eat baked foods instead of fried foods; drink diet soda or water instead of juice or soda  Be physically active find an activity I enjoy  Prevent falls -  Meeting treatment goals -    No flowsheet data found.   Care Management & Community Referrals:  Referral 02/01/2013  Referrals made for care management support none needed

## 2014-06-13 NOTE — Assessment & Plan Note (Signed)
Patient continues to drink excessively. He is likely intoxicated during the clinic visit.  Plan  - not ready to quit - check magnesium level  - consider referral to CSW to see if he can be helped with alcohol cessation

## 2014-06-13 NOTE — Progress Notes (Signed)
Internal Medicine Clinic Attending  I saw and evaluated the patient.  I personally confirmed the key portions of the history and exam documented by Dr. Alice Rieger and I reviewed pertinent patient test results.  The assessment, diagnosis, and plan were formulated together and I agree with the documentation in the resident's note.

## 2014-06-16 ENCOUNTER — Encounter: Payer: Self-pay | Admitting: *Deleted

## 2014-06-16 MED ORDER — MAGNESIUM CHLORIDE ER 535 (64 MG) MG PO TBCR: 3.0000 | EXTENDED_RELEASE_TABLET | Freq: Three times a day (TID) | ORAL | Status: DC

## 2014-06-16 NOTE — Assessment & Plan Note (Signed)
Mg is still low at recheck at 1.3.  Contacted patient and instructed him to increase Magnesium from 64 mg bid to 64 mg tid. He states that he has appointment on 5/17. It should be rechecked at that visit.

## 2014-06-16 NOTE — Addendum Note (Signed)
Addended by: Jessee Avers on: 06/16/2014 09:42 AM   Modules accepted: Orders

## 2014-06-19 ENCOUNTER — Other Ambulatory Visit: Payer: Self-pay | Admitting: Internal Medicine

## 2014-07-01 ENCOUNTER — Encounter: Payer: Self-pay | Admitting: Internal Medicine

## 2014-07-01 ENCOUNTER — Inpatient Hospital Stay (HOSPITAL_COMMUNITY): Payer: Self-pay

## 2014-07-01 ENCOUNTER — Ambulatory Visit (INDEPENDENT_AMBULATORY_CARE_PROVIDER_SITE_OTHER): Payer: Self-pay | Admitting: Internal Medicine

## 2014-07-01 ENCOUNTER — Inpatient Hospital Stay (HOSPITAL_COMMUNITY)
Admission: AD | Admit: 2014-07-01 | Discharge: 2014-07-03 | DRG: 372 | Disposition: A | Discharge status: Home / Self Care | Payer: Self-pay | Source: Ambulatory Visit | Attending: Oncology | Admitting: Oncology

## 2014-07-01 VITALS — BP 89/61 | HR 120 | Ht 65.0 in | Wt 107.7 lb

## 2014-07-01 DIAGNOSIS — E871 Hypo-osmolality and hyponatremia: Secondary | ICD-10-CM | POA: Diagnosis present

## 2014-07-01 DIAGNOSIS — R2689 Other abnormalities of gait and mobility: Secondary | ICD-10-CM

## 2014-07-01 DIAGNOSIS — R42 Dizziness and giddiness: Secondary | ICD-10-CM

## 2014-07-01 DIAGNOSIS — I10 Essential (primary) hypertension: Secondary | ICD-10-CM | POA: Diagnosis present

## 2014-07-01 DIAGNOSIS — A047 Enterocolitis due to Clostridium difficile: Principal | ICD-10-CM | POA: Diagnosis present

## 2014-07-01 DIAGNOSIS — A0472 Enterocolitis due to Clostridium difficile, not specified as recurrent: Secondary | ICD-10-CM | POA: Diagnosis present

## 2014-07-01 DIAGNOSIS — F1020 Alcohol dependence, uncomplicated: Secondary | ICD-10-CM | POA: Diagnosis present

## 2014-07-01 DIAGNOSIS — I951 Orthostatic hypotension: Secondary | ICD-10-CM

## 2014-07-01 DIAGNOSIS — F1010 Alcohol abuse, uncomplicated: Secondary | ICD-10-CM | POA: Diagnosis present

## 2014-07-01 DIAGNOSIS — N179 Acute kidney failure, unspecified: Secondary | ICD-10-CM

## 2014-07-01 DIAGNOSIS — H5500 Unspecified nystagmus: Secondary | ICD-10-CM

## 2014-07-01 DIAGNOSIS — R740 Nonspecific elevation of levels of transaminase and lactic acid dehydrogenase [LDH]: Secondary | ICD-10-CM

## 2014-07-01 DIAGNOSIS — E876 Hypokalemia: Secondary | ICD-10-CM

## 2014-07-01 DIAGNOSIS — R2681 Unsteadiness on feet: Secondary | ICD-10-CM | POA: Diagnosis present

## 2014-07-01 DIAGNOSIS — F10239 Alcohol dependence with withdrawal, unspecified: Secondary | ICD-10-CM

## 2014-07-01 DIAGNOSIS — F10230 Alcohol dependence with withdrawal, uncomplicated: Secondary | ICD-10-CM

## 2014-07-01 DIAGNOSIS — IMO0001 Reserved for inherently not codable concepts without codable children: Secondary | ICD-10-CM

## 2014-07-01 DIAGNOSIS — E860 Dehydration: Secondary | ICD-10-CM | POA: Diagnosis present

## 2014-07-01 DIAGNOSIS — F10930 Alcohol use, unspecified with withdrawal, uncomplicated: Secondary | ICD-10-CM

## 2014-07-01 DIAGNOSIS — F17210 Nicotine dependence, cigarettes, uncomplicated: Secondary | ICD-10-CM

## 2014-07-01 LAB — COMPREHENSIVE METABOLIC PANEL
ALT: 44 U/L (ref 17–63)
AST: 77 U/L — AB (ref 15–41)
Albumin: 4.2 g/dL (ref 3.5–5.0)
Alkaline Phosphatase: 155 U/L — ABNORMAL HIGH (ref 38–126)
Anion gap: 17 — ABNORMAL HIGH (ref 5–15)
BUN: 45 mg/dL — AB (ref 6–20)
CALCIUM: 9.9 mg/dL (ref 8.9–10.3)
CO2: 22 mmol/L (ref 22–32)
CREATININE: 6.42 mg/dL — AB (ref 0.61–1.24)
Chloride: 91 mmol/L — ABNORMAL LOW (ref 101–111)
GFR calc Af Amer: 11 mL/min — ABNORMAL LOW (ref >60)
GFR calc non Af Amer: 9 mL/min — ABNORMAL LOW (ref >60)
Glucose, Bld: 92 mg/dL (ref 65–99)
Potassium: 4.3 mmol/L (ref 3.5–5.1)
Sodium: 130 mmol/L — ABNORMAL LOW (ref 135–145)
Total Bilirubin: 0.9 mg/dL (ref 0.3–1.2)
Total Protein: 8 g/dL (ref 6.5–8.1)

## 2014-07-01 LAB — PHENYTOIN LEVEL, TOTAL: Phenytoin Lvl: 4.8 ug/mL — ABNORMAL LOW (ref 10.0–20.0)

## 2014-07-01 LAB — CBC
HCT: 37.7 % — ABNORMAL LOW (ref 39.0–52.0)
HEMOGLOBIN: 13.5 g/dL (ref 13.0–17.0)
MCH: 33.9 pg (ref 26.0–34.0)
MCHC: 35.8 g/dL (ref 30.0–36.0)
MCV: 94.7 fL (ref 78.0–100.0)
Platelets: 195 10*3/uL (ref 150–400)
RBC: 3.98 MIL/uL — AB (ref 4.22–5.81)
RDW: 14 % (ref 11.5–15.5)
WBC: 9.8 10*3/uL (ref 4.0–10.5)

## 2014-07-01 LAB — MAGNESIUM: MAGNESIUM: 1.7 mg/dL (ref 1.7–2.4)

## 2014-07-01 MED ORDER — LORAZEPAM 1 MG PO TABS: 1.0000 mg | ORAL_TABLET | Freq: Four times a day (QID) | ORAL | Status: DC | PRN

## 2014-07-01 MED ORDER — SODIUM CHLORIDE 0.9 % IV SOLN: 250.0000 mL | INTRAVENOUS | Status: DC | PRN

## 2014-07-01 MED ORDER — ACETAMINOPHEN 650 MG RE SUPP: 650.0000 mg | Freq: Four times a day (QID) | RECTAL | Status: DC | PRN

## 2014-07-01 MED ORDER — SODIUM CHLORIDE 0.9 % IJ SOLN
3.0000 mL | Freq: Two times a day (BID) | INTRAMUSCULAR | Status: DC
  Administered 2014-07-01 – 2014-07-02 (×3): 3 mL via INTRAVENOUS

## 2014-07-01 MED ORDER — FOLIC ACID 1 MG PO TABS
1.0000 mg | ORAL_TABLET | Freq: Every day | ORAL | Status: DC
  Administered 2014-07-01 – 2014-07-03 (×3): 1 mg via ORAL
  Filled 2014-07-01 (×3): qty 1

## 2014-07-01 MED ORDER — THIAMINE HCL 100 MG/ML IJ SOLN: 100.0000 mg | Freq: Every day | INTRAMUSCULAR | Status: DC

## 2014-07-01 MED ORDER — HEPARIN SODIUM (PORCINE) 5000 UNIT/ML IJ SOLN
5000.0000 [IU] | Freq: Three times a day (TID) | INTRAMUSCULAR | Status: DC
  Administered 2014-07-01 – 2014-07-03 (×6): 5000 [IU] via SUBCUTANEOUS
  Filled 2014-07-01 (×6): qty 1

## 2014-07-01 MED ORDER — ACETAMINOPHEN 325 MG PO TABS: 650.0000 mg | ORAL_TABLET | Freq: Four times a day (QID) | ORAL | Status: DC | PRN

## 2014-07-01 MED ORDER — ONDANSETRON HCL 4 MG PO TABS: 4.0000 mg | ORAL_TABLET | Freq: Four times a day (QID) | ORAL | Status: DC | PRN

## 2014-07-01 MED ORDER — ONDANSETRON HCL 4 MG/2ML IJ SOLN: 4.0000 mg | Freq: Four times a day (QID) | INTRAMUSCULAR | Status: DC | PRN

## 2014-07-01 MED ORDER — VITAMIN B-1 100 MG PO TABS
100.0000 mg | ORAL_TABLET | Freq: Every day | ORAL | Status: DC
  Administered 2014-07-01 – 2014-07-03 (×3): 100 mg via ORAL
  Filled 2014-07-01 (×3): qty 1

## 2014-07-01 MED ORDER — SODIUM CHLORIDE 0.9 % IV SOLN
INTRAVENOUS | Status: AC
  Administered 2014-07-01 – 2014-07-02 (×2): via INTRAVENOUS

## 2014-07-01 MED ORDER — ADULT MULTIVITAMIN W/MINERALS CH
1.0000 | ORAL_TABLET | Freq: Every day | ORAL | Status: DC
  Administered 2014-07-01 – 2014-07-03 (×3): 1 via ORAL
  Filled 2014-07-01 (×3): qty 1

## 2014-07-01 MED ORDER — SODIUM CHLORIDE 0.9 % IJ SOLN
3.0000 mL | INTRAMUSCULAR | Status: DC | PRN
Start: 2014-07-01 — End: 2014-07-03

## 2014-07-01 MED ORDER — SODIUM CHLORIDE 0.9 % IV BOLUS (SEPSIS)
500.0000 mL | Freq: Once | INTRAVENOUS | Status: AC
  Administered 2014-07-01: 500 mL via INTRAVENOUS

## 2014-07-01 MED ORDER — LORAZEPAM 2 MG/ML IJ SOLN: 1.0000 mg | Freq: Four times a day (QID) | INTRAMUSCULAR | Status: DC | PRN

## 2014-07-01 NOTE — Assessment & Plan Note (Signed)
Reports that he was drinking 1 pint of gin every other day until 6 days ago, when alcohol did not sound appealing. States that he is not otherwise ready to give up drinking alcohol.  - Continue to offer cessation support

## 2014-07-01 NOTE — Assessment & Plan Note (Signed)
Patient is tremulous, but not diaphoretic on exam. No asterixis. States that his last drink was 6 days ago. No recent seizures.   - Admit to IMTS; not likely to be in withdrawal currently, but monitor closely

## 2014-07-01 NOTE — Assessment & Plan Note (Signed)
Resolved today on supplementation at home. - Continue to follow

## 2014-07-01 NOTE — Assessment & Plan Note (Signed)
Currently has orthostatic hypotension in context of recent GI losses (diarrhea and vomiting). Likely contributing to his dizziness. Alcohol use may also be contributing to his dehydration. Magnesium has been corrected since last admission (1.7 today).  - CMP, Mg and CBC collected in clinic - Given 500 cc bolus NS in clinic - Admission for AKI, persistent dizziness

## 2014-07-01 NOTE — Assessment & Plan Note (Signed)
BP Readings from Last 3 Encounters:  07/01/14 89/61  06/13/14 165/93  05/25/14 173/83    Lab Results  Component Value Date   NA 130* 07/01/2014   K 4.3 07/01/2014   CREATININE 6.42* 07/01/2014    Assessment: Blood pressure control: patient's BP is very low today and he has orthostatic hypotension; acutely ill.    Plan: Medications:  Holding current medications given acute illness and hypotension (and orthostatic hypotension)   - Suggest that admitting team hold patient's anti-hypertensives; can restart as blood pressures and orthostatic status normalize

## 2014-07-01 NOTE — Assessment & Plan Note (Signed)
Patient continues to have gait instability (which he calls dizziness). He has orthostatic hypotension on exam today, which could certainly be contributing. His cerebellar signs are normal (FNF), but he has horizontal nystagmus; it is possible that he has early signs of Wernicke's encephalopathy. Wernicke's encephalopathy can be accompanied by vestibular dysfunction. The patient has been compliant with his thiamine at home. He has an otherwise non-focal exam.   - Admission for AKI and persistent dizziness - Admitting team to consider high dose thiamine - Fluid resuscitation

## 2014-07-01 NOTE — Progress Notes (Signed)
Subjective:    Patient ID: Joseph Underwood, male    DOB: [birth year], 49 y.o.   MRN: [id number]  HPI  Joseph Underwood is a 49 yo man with a history of hypertension and alcohol abuse with multiple hospitalizations, including several in the ICU, for withdrawal and alcohol-related seizures. He presents today for assessment of his several month history of dizziness and "feeling like he is going to fall". In fact, he has fallen several times. He has not hit his head. His dizziness is brought on by standing up or getting up from lying down. It is accompanied by a slight headache, diarrhea, nausea and vomiting; his last episodes of emesis and diarrhea were this morning. He is not on diuretics.  He reports that his last alcoholic beverage was on 0/86/57 (6 days ago). When asked why he stopped, he states that alcohol did not sound good to him starting then. He had been drinking 1 pint of gin every other day prior to that. He has been taking his thiamine, anti-epileptic, magnesium, KCl and anti-hypertensive medications as prescribed. He has been eating and drinking water at home.   He had a recent hospitalization 05/22/14-05/25/14 for orthostatic hypotension with elevated blood pressure. He was admitted from clinic for hypomagnesemia and orthostatic hypotension. His orthostatic hypotension resolved with fluid rehydration and was thought to be due to volume depletion from chronic alcohol abuse. He was started on amlodipine at that time, his magnesium dose was increased and volume intake and alcohol cessation were encouraged. He left this hospitalization AMA.   On his last clinic visit on 06/13/14, he was complaining of difficulty with balance and described his walk as "wobbly". At that time, he was still hypertensive. Lisinopril was added to his regimen of amlodipine and an MRI was discussed; however, the patient was worried about the cost of the imaging. He does not feel like anything has changed in these symptoms  since his last visit.   His most recent head CT was 02/2014 and was significant for mild cerebral atrophy without acute abnormalities.  Other than stopping his drinking last week when it did not appeal to him, he states that he has not thought about alcohol cessation yet. He is currently a half pack a day smoker and has a 37-pack-year history. He denies any use of illicit drugs.  He is accompanied by his mother, who confirms that the history is accurate.   Review of Systems  Constitutional: Positive for diaphoresis and activity change. Negative for fever, chills and appetite change.  Eyes: Negative.   Respiratory: Negative.  Negative for shortness of breath.   Cardiovascular: Negative.  Negative for chest pain and palpitations.  Gastrointestinal: Positive for nausea, vomiting and diarrhea.  Endocrine: Negative.   Musculoskeletal: Negative.   Skin: Negative.   Neurological: Positive for dizziness and headaches. Negative for syncope.  Psychiatric/Behavioral: Negative for hallucinations. The patient is nervous/anxious.        Objective:   Physical Exam  Constitutional: He is oriented to person, place, and time. He appears distressed.  HENT:  Head: Normocephalic and atraumatic.  Eyes: Conjunctivae and EOM are normal. Pupils are equal, round, and reactive to light.  Steady horizontal nystagmus on horizontal gaze in both directions in both eyes  Neck: Normal range of motion. Neck supple.  Cardiovascular: Regular rhythm and normal heart sounds.   tachycardic  Pulmonary/Chest: Effort normal and breath sounds normal. No respiratory distress. He has no wheezes.  Abdominal: Soft. He exhibits no distension. There  is no tenderness.  No asterixis  Musculoskeletal: Normal range of motion. He exhibits no edema.  Neurological: He is alert and oriented to person, place, and time. Coordination normal.  Finger-nose-finger intact on both sides  Skin: Skin is warm and dry. He is not diaphoretic.    Psychiatric:  anxious  Vitals reviewed.         Assessment & Plan:   Please see problem oriented charting for assessment & plan  Venita Lick, MD

## 2014-07-01 NOTE — Assessment & Plan Note (Signed)
Resolved on supplementation KCl at home.  - Continue to follow

## 2014-07-01 NOTE — Assessment & Plan Note (Signed)
Patient's creatinine rose from 0.65 two weeks ago to 6.42 today. This was in the context of GI loss (diarrhea and vomiting about 3x/day). He reports good food and water intake during this time. He is not on any diuretics.   - 500 cc bolus NS in clinic - Admission for AKI and persistent dizziness

## 2014-07-01 NOTE — H&P (Signed)
Date: 07/01/2014               Patient Name:  Joseph Underwood MRN: [id number]  DOB: February 08, 1966 Age / Sex: 49 y.o., male   PCP: Karlene Einstein, MD         Medical Service: Internal Medicine Teaching Service         Attending Physician: Dr. Annia Belt, MD    First Contact: Dr. Albin Felling Pager: [phone]  Second Contact: Dr. Joni Reining  Pager: [phone]       After Hours (After 5p/  First Contact Pager: [phone]  weekends / holidays): Second Contact Pager: [phone]   Chief Complaint: Dizziness, nausea/vomiting, diarrhea   History of Present Illness: [name] is a 49yo man with PMHx of HTN and alcohol abuse with multiple hospitalizations for withdrawal and alcohol-related seizures, who was admitted from the IM clinic for dizziness, nausea, vomiting, and diarrhea. Labs were performed in clinic and patient was found to have an elevated Cr of 6.42, previously 0.65 two weeks ago. Patient reports a several month history of dizziness and gait instability which resolved for a short period of time but then restarted a few weeks ago. He notes he feels lightheaded and dizzy, especially upon standing up. He denies any vertigo. He notes about 4 falls in the past few weeks. He denies hitting his head.  He reports about 4-5 days ago he started to have nausea, vomiting, and diarrhea. He notes up to 10 bowel movements a day for the past 3 days. He describes the BMs as watery and non-bloody. He also reports associated bilateral flank pain for the past 4-5 days as well. He denies any fever, chills, abdominal pain, difficulty urinating, or hematuria. He denies any NSAID use or herbal supplement use. Of note, he was started on Lisinopril 5 mg daily two weeks ago for his hypertension.   Home Meds: Amlodipine 10 mg daily Lisinopril 5 mg daily Mag64 535 (64 mg), 3 tablets BID Phenytoin 100 mg ER QHS Potassium chloride 20 mEq daily Visine eye drops Thiamine 100 mg daily   Allergies: Allergies as of  07/01/2014  . (No Known Allergies)   Past Medical History  Diagnosis Date  . Alcohol abuse   . Hiccups   . HTN (hypertension)   . Alcohol related seizure    Past Surgical History  Procedure Laterality Date  . No past surgeries     Family History  Problem Relation Age of Onset  . Hypertension Mother    History   Social History  . Marital Status: Single    Spouse Name: N/A  . Number of Children: N/A  . Years of Education: N/A   Occupational History  . Not on file.   Social History Main Topics  . Smoking status: Current Every Day Smoker -- 0.50 packs/day for 37 years    Types: Cigarettes  . Smokeless tobacco: Never Used     Comment: 1/2PKD  . Alcohol Use: 52.8 oz/week    14 Cans of beer, 74 Shots of liquor per week     Comment: 12/16/2013 "~ 1 pint of liquor per day"  . Drug Use: No  . Sexual Activity: No   Other Topics Concern  . Not on file   Social History Narrative    Review of Systems: General: Denies night sweats, changes in weight HEENT: Denies headaches, ear pain, changes in vision, rhinorrhea, sore throat CV: Denies CP, palpitations, SOB, orthopnea Pulm: Denies SOB, cough, wheezing GI:  Denies diarrhea, constipation, melena, hematochezia GU: Denies dysuria, frequency Msk: Denies muscle cramps, joint pains Neuro: Denies weakness, numbness, tingling Skin: Denies rashes, bruising  Physical Exam: BP 89/61   HR 120  RR 12 General: middle aged man sitting up in bed, NAD HEENT: [place]/AT, EOMI, horizontal nystagmus on horizontal gaze in both directions, mucus membranes moist CV: tachycardic, no m/g/r Pulm: CTA bilaterally, breaths non-labored Abd: BS+, soft, non-tender, non-distended, no CVA tenderness Ext: warm, no edema, moves all Neuro: alert and oriented x 3, no focal deficits  Lab results: Basic Metabolic Panel:  Recent Labs  07/01/14 1222  NA 130*  K 4.3  CL 91*  CO2 22  GLUCOSE 92  BUN 45*  CREATININE 6.42*  CALCIUM 9.9  MG 1.7    Liver Function Tests:  Recent Labs  07/01/14 1222  AST 77*  ALT 44  ALKPHOS 155*  BILITOT 0.9  PROT 8.0  ALBUMIN 4.2   CBC:  Recent Labs  07/01/14 1222  WBC 9.8  HGB 13.5  HCT 37.7*  MCV 94.7  PLT 195    Assessment & Plan by Problem:  Acute Renal  Failure: Patient presented with symptoms of dizziness, nausea, vomiting, and diarrhea found to have an elevated Cr of 6.42 which was previously normal at 0.65 two weeks ago. He denies any difficulty urinating. He noted bilateral flank pain for past 4-5 days but no tenderness on exam. His BUN is only slightly elevated at 45 compared with his significant Cr increase making a prerenal cause less likely even with his hx of volume losses. Denies recent NSAID use or herbal supplement use. Obstruction in differential but would not expect such an elevated Cr if only one-sided and bilateral rare. He was started on Lisinopril 5 mg daily two weeks ago which raises suspicion for possible renal artery stenosis with such an elevated Cr when previously normal. Although would expect higher BPs but possible volume depletion contributing to lower BP. Will start with renal US to rule out obstruction and consider duplex if normal.  - UA - Urine sodium, urine Cr - Renal US - Consider renal duplex US if no obstruction seen and to rule out RAS - NS @ 125 - Renal function panel in AM - Strict I&Os - Daily weights   Nausea/Vomiting/Diarrhea: Patient presented with 4-5 day history of nausea, vomiting, and diarrhea. He describes having multiple watery stools for the past few days and has had C diff infection in the past. WBC count is normal at 9.8 making C diff less likely. Likely that his nausea/vomiting is related to his acute renal failure vs dizziness?. Surprisingly he does not appear volume depleted on exam despite several day history of volume losses.  - Check C diff PCR - Enteric precautions - NS @ 125 - Zofran PRN nausea   Dizziness: Patient with  previous history of dizziness/lightheadedness that has worsened over the last few weeks. He describes orthostatic hypotension with increased dizziness upon standing. His orthostatics are positive. He also has horizontal nystagmus (vestibular dysfunction) on exam which is concerning for possible Wernicke's encephalopathy given his alcohol abuse history. 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 - Consider high dose thiamine once renal failure improves  - Thiamine 100 mg daily for now   Hyponatremia: Na 130 on admission, down from 139 two weeks ago. Patient has history of hyponatremia likely secondary to alcohol use. Now could be decreased due to renal failure. - Continue  to monitor  - NS @ 125   Elevated AST: AST 77 on admission and ALT 44, approximately 2:1 ratio suggestive of alcoholic liver disease. Consistent with prior labs. He had a CT abdomen in January 2016 which showed mild diffuse hepatic steatosis and an 8 mm hypodensity in the lateral left lobe of the liver. An abdominal US was performed in Feb 2016 that suggested the hypodensity was a hemangioma but he should have a follow up US in 6 months.  - Will need outpatient abdominal US next month   HTN: BP 89/61 on admission. Much lower than usual BPs in 549I-264B systolic. He takes amlodipine 10 mg daily and was started on lisinopril 5 mg daily recently as noted above.  - Hold all BP meds as normotensive now   Hx Alcohol Abuse/Alcohol-related Seizures: Hx of multiple hospitalizations due to alcohol withdrawal/seizures. Patient states his last drink was 6 days ago. He denies any tremors or hallucinations. He is on Phenytoin 300 mg ER QHS at home but reports he has not taken recently due to running out of this medication.  - Place on CIWA protocol - Check dilantin level  - Thiamine, folic acid, MVM  Diet: Heart healthy  VTE PPx: Heparin SQ Dispo: Disposition is deferred at this time,  awaiting improvement of current medical problems. Anticipated discharge in approximately 2-3 day(s).   The patient does have a current PCP Karlene Einstein, MD) and does need an [facility] hospital follow-up appointment after discharge.  The patient does not have transportation limitations that hinder transportation to clinic appointments.  Signed: Juliet Rude, MD 07/01/2014, 5:14 PM

## 2014-07-02 LAB — RAPID URINE DRUG SCREEN, HOSP PERFORMED
Amphetamines: NOT DETECTED
BENZODIAZEPINES: NOT DETECTED
Barbiturates: NOT DETECTED
Cocaine: NOT DETECTED
OPIATES: NOT DETECTED
Tetrahydrocannabinol: NOT DETECTED

## 2014-07-02 LAB — URINE MICROSCOPIC-ADD ON

## 2014-07-02 LAB — RENAL FUNCTION PANEL
Albumin: 3.1 g/dL — ABNORMAL LOW (ref 3.5–5.0)
Anion gap: 11 (ref 5–15)
BUN: 52 mg/dL — AB (ref 6–20)
CALCIUM: 8.5 mg/dL — AB (ref 8.9–10.3)
CHLORIDE: 98 mmol/L — AB (ref 101–111)
CO2: 21 mmol/L — ABNORMAL LOW (ref 22–32)
Creatinine, Ser: 4.4 mg/dL — ABNORMAL HIGH (ref 0.61–1.24)
GFR calc Af Amer: 17 mL/min — ABNORMAL LOW (ref >60)
GFR calc non Af Amer: 15 mL/min — ABNORMAL LOW (ref >60)
GLUCOSE: 89 mg/dL (ref 65–99)
PHOSPHORUS: 5.5 mg/dL — AB (ref 2.5–4.6)
POTASSIUM: 4.2 mmol/L (ref 3.5–5.1)
Sodium: 130 mmol/L — ABNORMAL LOW (ref 135–145)

## 2014-07-02 LAB — URINALYSIS, ROUTINE W REFLEX MICROSCOPIC
GLUCOSE, UA: NEGATIVE mg/dL
Hgb urine dipstick: NEGATIVE
Ketones, ur: NEGATIVE mg/dL
Leukocytes, UA: NEGATIVE
Nitrite: NEGATIVE
Protein, ur: 30 mg/dL — AB
Specific Gravity, Urine: 1.017 (ref 1.005–1.030)
UROBILINOGEN UA: 0.2 mg/dL (ref 0.0–1.0)
pH: 5 (ref 5.0–8.0)

## 2014-07-02 LAB — HIV ANTIBODY (ROUTINE TESTING W REFLEX): HIV Screen 4th Generation wRfx: NONREACTIVE

## 2014-07-02 LAB — CLOSTRIDIUM DIFFICILE BY PCR: Toxigenic C. Difficile by PCR: POSITIVE — AB

## 2014-07-02 LAB — SODIUM, URINE, RANDOM: SODIUM UR: 29 mmol/L

## 2014-07-02 LAB — CREATININE, URINE, RANDOM: CREATININE, URINE: 334.65 mg/dL

## 2014-07-02 MED ORDER — PRO-STAT SUGAR FREE PO LIQD
30.0000 mL | Freq: Two times a day (BID) | ORAL | Status: DC
  Administered 2014-07-02 – 2014-07-03 (×3): 30 mL via ORAL
  Filled 2014-07-02 (×3): qty 30

## 2014-07-02 MED ORDER — METRONIDAZOLE 500 MG PO TABS
500.0000 mg | ORAL_TABLET | Freq: Three times a day (TID) | ORAL | Status: DC
  Administered 2014-07-02 – 2014-07-03 (×4): 500 mg via ORAL
  Filled 2014-07-02 (×4): qty 1

## 2014-07-02 MED ORDER — VANCOMYCIN 50 MG/ML ORAL SOLUTION
125.0000 mg | Freq: Four times a day (QID) | ORAL | Status: DC
  Administered 2014-07-02: 125 mg via ORAL
  Filled 2014-07-02 (×3): qty 2.5

## 2014-07-02 NOTE — Progress Notes (Signed)
Subjective: Patient reports his diarrhea and dizziness are getting better. His Cr has improved with IVF to 4.4 this AM. He denies any difficulties urinating.   Objective: Vital signs in last 24 hours: Filed Vitals:   07/01/14 2123 07/02/14 0142 07/02/14 0513  BP: 92/52 121/72 128/75  Pulse: 105 103 103  Temp: 99 F (37.2 C) 98.6 F (37 C) 99 F (37.2 C)  TempSrc: Oral Oral Oral  Resp: 17 17 16   Weight:   113 lb 14.4 oz (51.665 kg)  SpO2: 98% 100% 100%   Weight change:   Intake/Output Summary (Last 24 hours) at 07/02/14 1341 Last data filed at 07/02/14 0518  Gross per 24 hour  Intake 1479.17 ml  Output    200 ml  Net 1279.17 ml   Physical Exam General: middle aged man sitting up in bed, NAD HEENT: [place]/AT, EOMI, mucus membranes moist CV: tachycardic, no m/g/r Pulm: CTA bilaterally, breaths non-labored Abd: BS+, soft, non-tender, non-distended Ext: warm, no edema, moves all Neuro: alert and oriented x 3, no focal deficits  Lab Results: Basic Metabolic Panel:  Recent Labs Lab 07/01/14 1222 07/02/14 0410  NA 130* 130*  K 4.3 4.2  CL 91* 98*  CO2 22 21*  GLUCOSE 92 89  BUN 45* 52*  CREATININE 6.42* 4.40*  CALCIUM 9.9 8.5*  MG 1.7  --   PHOS  --  5.5*   Liver Function Tests:  Recent Labs Lab 07/01/14 1222 07/02/14 0410  AST 77*  --   ALT 44  --   ALKPHOS 155*  --   BILITOT 0.9  --   PROT 8.0  --   ALBUMIN 4.2 3.1*   CBC:  Recent Labs Lab 07/01/14 1222  WBC 9.8  HGB 13.5  HCT 37.7*  MCV 94.7  PLT 195   Urine Drug Screen: Drugs of Abuse     Component Value Date/Time   LABOPIA NONE DETECTED 07/02/2014 1230   COCAINSCRNUR NONE DETECTED 07/02/2014 1230   LABBENZ NONE DETECTED 07/02/2014 1230   AMPHETMU NONE DETECTED 07/02/2014 1230   THCU NONE DETECTED 07/02/2014 1230   LABBARB NONE DETECTED 07/02/2014 1230    Urinalysis:  Recent Labs Lab 07/02/14 0540  COLORURINE AMBER*  LABSPEC 1.017  PHURINE 5.0  GLUCOSEU NEGATIVE  HGBUR  NEGATIVE  BILIRUBINUR SMALL*  KETONESUR NEGATIVE  PROTEINUR 30*  UROBILINOGEN 0.2  NITRITE NEGATIVE  LEUKOCYTESUR NEGATIVE   Studies/Results: US Renal  07/01/2014   CLINICAL DATA:  Acute renal failure.  EXAM: RENAL / URINARY TRACT ULTRASOUND COMPLETE  COMPARISON:  CT scan of the abdomen dated 03/13/2014  FINDINGS: Right Kidney:  Length: 10.5 cm. Echogenicity within normal limits. 7 mm cyst on the medial aspect of the lower pole. No solid mass or hydronephrosis visualized.  Left Kidney:  Length: 11.1 cm. Echogenicity within normal limits. No mass or hydronephrosis visualized.  Bladder:  Empty.  IMPRESSION: No significant abnormality. Tiny cyst on the lower pole of the right kidney, unchanged.   Electronically Signed   By: Lorriane Shire M.D.   On: 07/01/2014 21:20   Medications: I have reviewed the patient's current medications.  Assessment/Plan:  Acute Renal Failure: Cr improved this morning to 4.4, down from 6.42 on admission. UA with amorphous urates/phosphates and small amt of protein but otherwise negative for infection. FENa calculated to be 0.3% which suggests a prerenal etiology consistent with patient's history of volume losses from vomiting and diarrhea. Renal US negative for obstruction. His C diff PCR was found to  be positive today. This could be the cause of his acute renal failure. Will start oral Vanc as described below. Will continue to monitor Cr and if does not continue to trend in right direction then will consider renal duplex US to evaluate for RAS.  - Oral vanc  - NS @ 125 - Check UDS>> negative - Check heavy metals>> pending  - Renal function panel in AM - Strict I&Os - Daily weights   C Diff Infection: C diff PCR came back positive. Surprisingly patient reported improvement in his diarrhea before treatment was started. His C diff infection could possibly be the cause of his ARF. He was positive for C diff in January this year. Will start oral vanc.  - Start oral Vanc  125 mg 4 times daily  - Enteric precautions - NS @ 125 - Zofran PRN nausea   Dizziness: Likely volume depletion contributing to possible underlying Wernicke's encephalopathy. He noted improvement in his dizziness after IVF hydration.  - Consider high dose thiamine once renal failure improves  - Thiamine 100 mg daily for now   Hyponatremia: Na 130 on admission, stable this morning at 130. Patient has history of hyponatremia likely secondary to alcohol use. Now could be decreased due to renal failure. - Continue to monitor  - NS @ 125   Elevated AST: AST 77 on admission and ALT 44, approximately 2:1 ratio suggestive of alcoholic liver disease. Consistent with prior labs. He had a CT abdomen in January 2016 which showed mild diffuse hepatic steatosis and an 8 mm hypodensity in the lateral left lobe of the liver. An abdominal US was performed in Feb 2016 that suggested the hypodensity was a hemangioma but he should have a follow up US in 6 months.  - Will need outpatient abdominal US next month   HTN: BP 89/61 on admission. Now stable in 076K systolic. He takes amlodipine 10 mg daily and was started on lisinopril 5 mg daily recently as noted above.  - Hold all BP meds as normotensive now   Hx Alcohol Abuse/Alcohol-related Seizures: Hx of multiple hospitalizations due to alcohol withdrawal/seizures. Patient states his last drink was on 5/12. No evidence of withdrawal on exam. He is on Phenytoin 300 mg ER QHS at home but reports he has not taken recently due to running out of this medication.  - Continue CIWA protocol - Check dilantin level >> subtherapeutic at 4.8 - Thiamine, folic acid, MVM  Diet: Heart healthy  VTE PPx: Heparin SQ Dispo: Disposition is deferred at this time, awaiting improvement of current medical problems.  Anticipated discharge in approximately 2-3 day(s).   The patient does have a current PCP Karlene Einstein, MD) and does need an [facility] hospital follow-up appointment  after discharge.  The patient does not have transportation limitations that hinder transportation to clinic appointments.  .Services Needed at time of discharge: Y = Yes, Blank = No PT:   OT:   RN:   Equipment:   Other:     LOS: 1 day   Juliet Rude, MD 07/02/2014, 1:41 PM

## 2014-07-02 NOTE — Care Management Note (Signed)
Case Management Note  Patient Details  Name: Joseph Underwood MRN: [id number] Date of Birth: 08/10/1965  Subjective/Objective:                    Action/Plan: UR completed   Expected Discharge Date:        07-05-14           Expected Discharge Plan:  Home/Self Care  In-House Referral:     Discharge planning Services     Post Acute Care Choice:    Choice offered to:     DME Arranged:    DME Agency:     HH Arranged:    [place] Agency:     Status of Service:  In process, will continue to follow  Medicare Important Message Given:    Date Medicare IM Given:    Medicare IM give by:    Date Additional Medicare IM Given:    Additional Medicare Important Message give by:     If discussed at [place] of Stay Meetings, dates discussed:    Additional Comments:  Marilu Favre, RN 07/02/2014, 8:03 AM

## 2014-07-02 NOTE — Progress Notes (Signed)
Initial Nutrition Assessment  DOCUMENTATION CODES:  Severe malnutrition in context of chronic illness  INTERVENTION:  Prostat, Snacks  NUTRITION DIAGNOSIS:  Malnutrition related to chronic illness as evidenced by estimated needs, severe depletion of muscle mass, severe depletion of body fat.  GOAL:  Patient will meet greater than or equal to 90% of their needs  MONITOR:  PO intake, Supplement acceptance, Labs, Weight trends, I & O's  REASON FOR ASSESSMENT:  Malnutrition Screening Tool    ASSESSMENT: Pt is a 49yo man with PMHx of HTN and alcohol abuse with multiple hospitalizations for withdrawal and alcohol-related seizures, who was admitted from the IM clinic for dizziness, nausea, vomiting, and diarrhea.   Pt just finished lunch at time of visit, consumed 75% of his meal. Pt states his appetite varies from one day to another, and he usually eats 2 meals and maybe a snack or two every day (pork rinds, chips). Encouraged pt to eat at least 3 meals and couple of snacks daily, pt agreed to take Prostat BID and one snack while at the hospital to help meet his needs, since he dislikes Boost and like products. Encouraged pt to order additional items on the trays to consume later and ask nurses for snacks if hungry.  Pt states his usual weight is about 130 Lb, however, pt is unable to remember when was the last time he weighed that ("could have been couple years" per pt). Will continue to monitor. Labs reviewed: Na 130, BUN 45, Cr 6.42    Height:  Ht Readings from Last 1 Encounters:  07/02/14 5\' 5"  (1.651 m)    Weight:  Wt Readings from Last 1 Encounters:  07/02/14 113 lb 14.4 oz (51.665 kg)    Ideal Body Weight:  62 kg  Wt Readings from Last 10 Encounters:  07/02/14 113 lb 14.4 oz (51.665 kg)  07/01/14 107 lb 11.2 oz (48.852 kg)  05/24/14 103 lb 2.4 oz (46.788 kg)  05/22/14 107 lb 4.8 oz (48.671 kg)  04/02/14 110 lb (49.896 kg)  03/28/14 110 lb 7.2 oz (50.1 kg)   03/28/14 110 lb (49.896 kg)  03/21/14 112 lb 3.2 oz (50.894 kg)  03/10/14 106 lb 0.7 oz (48.1 kg)  12/18/13 107 lb 9.4 oz (48.8 kg)    BMI:  Body mass index is 18.95 kg/(m^2).  Estimated Nutritional Needs:  Kcal:  1600 - 1850  Protein:  80 - 90 g  Fluid:  2.0 L  Skin:  Reviewed, no issues  Diet Order:  Diet Heart Room service appropriate?: Yes; Fluid consistency:: Thin  EDUCATION NEEDS:  No education needs identified at this time   Intake/Output Summary (Last 24 hours) at 07/02/14 1431 Last data filed at 07/02/14 0518  Gross per 24 hour  Intake 1479.17 ml  Output    200 ml  Net 1279.17 ml    Last BM:  5/18  Kaya Pottenger [facility] Dietetic Intern Pager: [phone] 07/02/2014 2:42 PM

## 2014-07-03 ENCOUNTER — Encounter (HOSPITAL_COMMUNITY): Payer: Self-pay | Admitting: *Deleted

## 2014-07-03 LAB — RENAL FUNCTION PANEL
ALBUMIN: 3.3 g/dL — AB (ref 3.5–5.0)
Anion gap: 7 (ref 5–15)
BUN: 32 mg/dL — AB (ref 6–20)
CALCIUM: 9.2 mg/dL (ref 8.9–10.3)
CHLORIDE: 104 mmol/L (ref 101–111)
CO2: 24 mmol/L (ref 22–32)
Creatinine, Ser: 1.01 mg/dL (ref 0.61–1.24)
GFR calc Af Amer: >60 mL/min (ref >60)
GFR calc non Af Amer: >60 mL/min (ref >60)
Glucose, Bld: 95 mg/dL (ref 65–99)
Phosphorus: 3.2 mg/dL (ref 2.5–4.6)
Potassium: 3.6 mmol/L (ref 3.5–5.1)
Sodium: 135 mmol/L (ref 135–145)

## 2014-07-03 MED ORDER — METRONIDAZOLE 500 MG PO TABS: 500.0000 mg | ORAL_TABLET | Freq: Three times a day (TID) | ORAL | Status: DC

## 2014-07-03 MED ORDER — PHENYTOIN SODIUM EXTENDED 300 MG PO CAPS: 300.0000 mg | ORAL_CAPSULE | Freq: Every day | ORAL | Status: DC

## 2014-07-03 MED ORDER — PHENYTOIN SODIUM EXTENDED 100 MG PO CAPS: 300.0000 mg | ORAL_CAPSULE | Freq: Every day | ORAL | Status: DC

## 2014-07-03 MED ORDER — ADULT MULTIVITAMIN W/MINERALS CH: 1.0000 | ORAL_TABLET | Freq: Every day | ORAL | Status: DC

## 2014-07-03 MED ORDER — AMLODIPINE BESYLATE 10 MG PO TABS
10.0000 mg | ORAL_TABLET | Freq: Every day | ORAL | Status: DC
  Administered 2014-07-03: 10 mg via ORAL
  Filled 2014-07-03: qty 1

## 2014-07-03 MED ORDER — FOLIC ACID 1 MG PO TABS: 1.0000 mg | ORAL_TABLET | Freq: Every day | ORAL | Status: DC

## 2014-07-03 MED ORDER — SODIUM CHLORIDE 0.9 % IV SOLN
INTRAVENOUS | Status: DC
  Administered 2014-07-03: 08:00:00 via INTRAVENOUS

## 2014-07-03 NOTE — Progress Notes (Signed)
Subjective: No acute events overnight. Patient reports his dizziness and diarrhea have improved. Patient states he had 3 bowel movements yesterday and none so far today.   Objective: Vital signs in last 24 hours: Filed Vitals:   07/02/14 2217 07/03/14 0143 07/03/14 0500 07/03/14 0608  BP: 165/93 144/96  157/96  Pulse: 103 96  98  Temp: 98.2 F (36.8 C) 98.8 F (37.1 C)  98.2 F (36.8 C)  TempSrc: Oral Oral  Oral  Resp: 18 17  16   Height:      Weight:   119 lb 3.2 oz (54.069 kg) 119 lb 4.8 oz (54.114 kg)  SpO2: 98% 100%  100%   Weight change: 5 lb 4.8 oz (2.404 kg)  Intake/Output Summary (Last 24 hours) at 07/03/14 0833 Last data filed at 07/02/14 1500  Gross per 24 hour  Intake    240 ml  Output    240 ml  Net      0 ml   Physical Exam General: middle aged man sitting up in bed, NAD HEENT: [place]/AT, EOMI, mucus membranes moist CV: RRR, no m/g/r Pulm: CTA bilaterally, breaths non-labored Abd: BS+, soft, non-tender, non-distended Ext: warm, no edema, moves all Neuro: alert and oriented x 3, no focal deficits  Lab Results: Basic Metabolic Panel:  Recent Labs Lab 07/01/14 1222 07/02/14 0410  NA 130* 130*  K 4.3 4.2  CL 91* 98*  CO2 22 21*  GLUCOSE 92 89  BUN 45* 52*  CREATININE 6.42* 4.40*  CALCIUM 9.9 8.5*  MG 1.7  --   PHOS  --  5.5*   Liver Function Tests:  Recent Labs Lab 07/01/14 1222 07/02/14 0410  AST 77*  --   ALT 44  --   ALKPHOS 155*  --   BILITOT 0.9  --   PROT 8.0  --   ALBUMIN 4.2 3.1*   CBC:  Recent Labs Lab 07/01/14 1222  WBC 9.8  HGB 13.5  HCT 37.7*  MCV 94.7  PLT 195   Urine Drug Screen: Drugs of Abuse     Component Value Date/Time   LABOPIA NONE DETECTED 07/02/2014 1230   COCAINSCRNUR NONE DETECTED 07/02/2014 1230   LABBENZ NONE DETECTED 07/02/2014 1230   AMPHETMU NONE DETECTED 07/02/2014 1230   THCU NONE DETECTED 07/02/2014 1230   LABBARB NONE DETECTED 07/02/2014 1230    Urinalysis:  Recent Labs Lab  07/02/14 0540  COLORURINE AMBER*  LABSPEC 1.017  PHURINE 5.0  GLUCOSEU NEGATIVE  HGBUR NEGATIVE  BILIRUBINUR SMALL*  KETONESUR NEGATIVE  PROTEINUR 30*  UROBILINOGEN 0.2  NITRITE NEGATIVE  LEUKOCYTESUR NEGATIVE    Studies/Results: US Renal  07/01/2014   CLINICAL DATA:  Acute renal failure.  EXAM: RENAL / URINARY TRACT ULTRASOUND COMPLETE  COMPARISON:  CT scan of the abdomen dated 03/13/2014  FINDINGS: Right Kidney:  Length: 10.5 cm. Echogenicity within normal limits. 7 mm cyst on the medial aspect of the lower pole. No solid mass or hydronephrosis visualized.  Left Kidney:  Length: 11.1 cm. Echogenicity within normal limits. No mass or hydronephrosis visualized.  Bladder:  Empty.  IMPRESSION: No significant abnormality. Tiny cyst on the lower pole of the right kidney, unchanged.   Electronically Signed   By: Lorriane Shire M.D.   On: 07/01/2014 21:20   Medications: I have reviewed the patient's current medications.  Assessment/Plan:  Acute Renal Failure: Likely secondary to volume depletion from C diff infection with diarrhea and vomiting. FENa 3% suggesting prerenal etiology. Renal US with no obstruction.  UDS negative. Cr improved to 1.1 this morning! Will follow up in IM clinic and need a repeat bmet at that time.  - Continue C diff tx with Flagyl - NS @ 125 - Check heavy metals>> pending  - Strict I&Os - Daily weights   C Diff Infection: C diff PCR positive. Patient notes his diarrhea is already improving. He was positive for C diff in January this year. Switched from oral vanc to flagyl as he is only having mild symptoms, is afebrile, and no leukocytosis.  - Continue Flagyl 500 mg Q8H - Enteric precautions - NS @ 125 - Zofran PRN nausea   Dizziness: Likely volume depletion contributing to possible underlying Wernicke's encephalopathy. He noted improvement in his dizziness after IVF hydration.  - Consider high dose thiamine once renal failure improves  - Thiamine 100 mg  daily for now   Hyponatremia: Na 130 on admission. Improved to 135 today. Patient has history of hyponatremia likely secondary to alcohol use. His acute renal failure could have been contributing as well. - NS @ 125   Elevated AST: AST 77 on admission and ALT 44, approximately 2:1 ratio suggestive of alcoholic liver disease. Consistent with prior labs. He had a CT abdomen in January 2016 which showed mild diffuse hepatic steatosis and an 8 mm hypodensity in the lateral left lobe of the liver. An abdominal US was performed in Feb 2016 that suggested the hypodensity was a hemangioma but he should have a follow up US in 6 months.  - Will need outpatient abdominal US next month   HTN: BP mildly elevated at 157/96 today. He takes amlodipine 10 mg daily and was started on lisinopril 5 mg daily recently as noted above.  - Restart home amlodipine - Hold lisinopril due to AKI  Hx Alcohol Abuse/Alcohol-related Seizures: Hx of multiple hospitalizations due to alcohol withdrawal/seizures. Patient states his last drink was on 5/12. No evidence of withdrawal on exam. He is on Phenytoin 300 mg ER QHS at home but reports he has not taken recently due to running out of this medication. Dilantin level subtherapeutic at 4.8.  - Continue CIWA protocol - Restart dilantin 300 mg QHS - Thiamine, folic acid, MVM  Diet: Heart healthy VTE PPx: Heparin SQ Dispo: Disposition is deferred at this time, awaiting improvement of current medical problems.  Anticipated discharge in approximately 1-2 day(s).   The patient does have a current PCP Karlene Einstein, MD) and does need an [facility] hospital follow-up appointment after discharge.  The patient does not have transportation limitations that hinder transportation to clinic appointments.  .Services Needed at time of discharge: Y = Yes, Blank = No PT:   OT:   RN:   Equipment:   Other:     LOS: 2 days   Juliet Rude, MD 07/03/2014, 8:33 AM

## 2014-07-03 NOTE — Progress Notes (Signed)
Internal Medicine Clinic Attending  Case discussed with Dr. Sherrine Maples at the time of the visit.  We reviewed the resident's history and exam and pertinent patient test results.  I agree with the assessment, diagnosis, and plan of care documented in the resident's note.  AGree with admission for orthostasis and AKI.

## 2014-07-03 NOTE — Care Management Note (Signed)
Case Management Note  Patient Details  Name: Gaurav E Martinique MRN: [id number] Date of Birth: 06-Mar-1965  Subjective/Objective:                    Action/Plan: [place] letter given and explained to patient. Patient voiced understanding.   Expected Discharge Date:  07/03/14               Expected Discharge Plan:  Home/Self Care  In-House Referral:     Discharge planning Services  CM Consult, [place] Program, Medication Assistance  Post Acute Care Choice:    Choice offered to:     DME Arranged:    DME Agency:     HH Arranged:    HH Agency:     Status of Service:  Completed, signed off  Medicare Important Message Given:    Date Medicare IM Given:    Medicare IM give by:    Date Additional Medicare IM Given:    Additional Medicare Important Message give by:     If discussed at [place] of Stay Meetings, dates discussed:    Additional Comments:  Marilu Favre, RN 07/03/2014, 11:50 AM

## 2014-07-03 NOTE — Discharge Instructions (Signed)
It was a pleasure taking care of you, Joseph Underwood.  - Take Metronidazole (Flagyl) 500 mg every 8 hours (three times daily). Your next dose will be at [facility] today and then continue 1 pill every 8 hours until you complete the entire prescription.  - DO NOT drink alcohol with Flagyl as this will make your stomach upset - Avoid drinking alcohol in general as many of your medical problems are related to alcohol - You have an appointment on May 26th at 2:15 PM in the Internal Medicine Clinic. Please make this appointment.   Take care, Dr. Arcelia Jew

## 2014-07-03 NOTE — Progress Notes (Signed)
Discharge instructions/prescription given and explained to pt.  Pt verbalized understanding of all orders/instructions and denies any questions at this time.  IV removed and site CDI.  Pt in no s/s of distress.  Waiting for ride. Graceann Congress

## 2014-07-03 NOTE — Progress Notes (Signed)
Pt discharged to home with mom and all belongings. Graceann Congress

## 2014-07-03 NOTE — Discharge Summary (Signed)
Name: Joseph Underwood MRN: [id number] DOB: 03-Aug-1965 49 y.o. PCP: Karlene Einstein, MD  Date of Admission: 07/01/2014  4:36 PM Date of Discharge: 07/03/2014 Attending Physician: Annia Belt, MD  Discharge Diagnosis: Principal Problem Acute Renal Failure Active Problems C Diff Infection Dizziness Hyponatremia Elevated AST HTN Hx Alcohol Abuse/Alcohol-related Seizures  Discharge Medications:   Medication List    STOP taking these medications        lisinopril 5 MG tablet  Commonly known as:  PRINIVIL,ZESTRIL     Potassium Chloride ER 20 MEQ Tbcr      TAKE these medications        amLODipine 10 MG tablet  Commonly known as:  NORVASC  Take 1 tablet (10 mg total) by mouth daily.     folic acid 1 MG tablet  Commonly known as:  FOLVITE  Take 1 tablet (1 mg total) by mouth daily.     MAG64 535 (64 MG) MG Tbcr  Generic drug:  Magnesium Chloride  TAKE 3 TABLETS (192 MG TOTAL) BY MOUTH 2 (TWO) TIMES DAILY.     metroNIDAZOLE 500 MG tablet  Commonly known as:  FLAGYL  Take 1 tablet (500 mg total) by mouth every 8 (eight) hours. Take next dose at [facility] today (07/03/14) and then every 8 hours for next 13 days.     multivitamin with minerals Tabs tablet  Take 1 tablet by mouth daily.     phenytoin 300 MG ER capsule  Commonly known as:  DILANTIN  Take 1 capsule (300 mg total) by mouth at bedtime.     thiamine 100 MG tablet  Commonly known as:  VITAMIN B-1  Take 1 tablet (100 mg total) by mouth daily.     VISINE OP  Place 1 drop into both eyes daily as needed (dry eyes).        Disposition and follow-up:   JosephKeaundre E Underwood was discharged from [facility] in Good condition.  At the hospital follow up visit please address:  1.  ARF- Cr improved to 1.1 by time of discharge. Check repeat bmet. Encourage patient to continue drinking plenty of fluids.  C Diff- Is patient taking his Flagyl?  Hyponatremia- Na 135 by time of discharge. Check a repeat  bmet. Elevated AST and Liver Hypodensity- Patient needs to have repeat abdominal US for his liver hypodensity next month (June).  HTN- BP recheck. May require another agent since Lisinopril was stopped. Home K-Dur was also stopped so please re-evaluate if this needs to be restarted.  2.  Labs / imaging needed at time of follow-up: bmet  3.  Pending labs/ test needing follow-up: Heavy metals profile   Follow-up Appointments:     Follow-up Information    Follow up with Drucilla Schmidt, MD.   Specialty:  Internal Medicine   Why:  Appointment on May 26th at 2:15PM   Contact information:   [place] [id number] [phone]       Discharge Instructions: Discharge Instructions    Diet - low sodium heart healthy    Complete by:  As directed      Increase activity slowly    Complete by:  As directed            Consultations:    Procedures Performed:  US Renal  07/01/2014   CLINICAL DATA:  Acute renal failure.  EXAM: RENAL / URINARY TRACT ULTRASOUND COMPLETE  COMPARISON:  CT scan of the abdomen dated 03/13/2014  FINDINGS: Right Kidney:  Length: 10.5 cm. Echogenicity within normal limits. 7 mm cyst on the medial aspect of the lower pole. No solid mass or hydronephrosis visualized.  Left Kidney:  Length: 11.1 cm. Echogenicity within normal limits. No mass or hydronephrosis visualized.  Bladder:  Empty.  IMPRESSION: No significant abnormality. Tiny cyst on the lower pole of the right kidney, unchanged.   Electronically Signed   By: Lorriane Shire M.D.   On: 07/01/2014 21:20    Admission HPI: Joseph Underwood is a 49yo man with PMHx of HTN and alcohol abuse with multiple hospitalizations for withdrawal and alcohol-related seizures, who was admitted from the IM clinic for dizziness, nausea, vomiting, and diarrhea. Labs were performed in clinic and patient was found to have an elevated Cr of 6.42, previously 0.65 two weeks ago. Patient reports a several month history of dizziness and  gait instability which resolved for a short period of time but then restarted a few weeks ago. He notes he feels lightheaded and dizzy, especially upon standing up. He denies any vertigo. He notes about 4 falls in the past few weeks. He denies hitting his head. He reports about 4-5 days ago he started to have nausea, vomiting, and diarrhea. He notes up to 10 bowel movements a day for the past 3 days. He describes the BMs as watery and non-bloody. He also reports associated bilateral flank pain for the past 4-5 days as well. He denies any fever, chills, abdominal pain, difficulty urinating, or hematuria. He denies any NSAID use or herbal supplement use. Of note, he was started on Lisinopril 5 mg daily two weeks ago for his hypertension.   Hospital Course by problem list:  Acute Renal Failure: Patient presented with symptoms of dizziness, nausea, vomiting, and diarrhea found to have an elevated Cr of 6.42 and BUN 45 with previously normal Cr at 0.65 two weeks ago. He denied any difficulty urinating. A Renal US was performed and showed no obstruction. His UDS was negative. He was started on IVF and his Cr improved to 4.4 by the following morning. His FENa was calculated to be 0.3% consistent with a prerenal etiology. His ARF was determined to be due to volume depletion secondary to his C diff infection. A heavy metals profile was ordered and results pending by time of discharge.  Patient was continued on IVF for an additional day and by time of discharge his Cr had improved significantly to 1.1. He was encouraged to drink plenty of fluids at home and complete Flagyl for his C diff infection. He has follow up in the IM clinic next week.   C Diff Infection: Patient presented with 4-5 day history of nausea, vomiting, and diarrhea. He described having multiple watery stools for the past few days and has had C diff infection in the past. His WBC count was 9.8 and he was afebrile. He was found to have a positive C  diff PCR and placed on Flagyl 500 mg Q8H for a total of 14 days (end date 07/16/14). By time of discharge, patient reported his diarrhea had improved and was only having a few bowel movements. He was educated on the importance of completing his Flagyl course and avoiding alcohol while on antibiotics.   Dizziness: Patient with previous history of dizziness/lightheadedness that had worsened over the last few weeks. He described orthostatic hypotension with increased dizziness upon standing. His orthostatics were positive. He also had horizontal nystagmus (vestibular dysfunction) on exam which is concerning for possible Wernicke's  encephalopathy given his alcohol abuse history. His dizziness improved after IVF were administered. He was continued on thiamine 100 mg daily.   Hyponatremia: Na 130 on admission, down from 139 two weeks ago. His Na improved to 135 by time of discharge. He likely had hyponatremia in setting of chronic alcohol abuse and acute renal failure. He will need a repeat bmet at his outpatient appointment.   Elevated AST: AST 77 on admission and ALT 44, approximately 2:1 ratio suggestive of alcoholic liver disease. Consistent with prior labs. He had a CT abdomen in January 2016 which showed mild diffuse hepatic steatosis and an 8 mm hypodensity in the lateral left lobe of the liver. An abdominal US was performed in Feb 2016 that suggested the hypodensity was a hemangioma but he should have a follow up US in 6 months. He will need to have a repeat abdominal US scheduled as an outpatient in June this year.   HTN: BP was 89/61 on admission. His BP improved to the 016W-109N systolic after IVF administration. He was then resumed on his home Amlodipine 10 mg daily once his ARF had improved, but his Lisinopril was held.   Hx Alcohol Abuse/Alcohol-related Seizures: Hx of multiple hospitalizations due to alcohol withdrawal/seizures. Patient reported his last drink was 6 days prior to admission. He was  continued to abstain from alcohol, especially while taking Flagyl for his C diff infection. He was resumed on his home Phenytoin 300 mg QHS. A Dilantin level was checked and was subtherapeutic at 4.8 as expected given patient's history that he had stopped taking his Phenytoin a few days prior to admission as he ran out. He was given a new prescription for Phenytoin upon discharge.   Discharge Vitals:   BP 138/88 mmHg  Pulse 98  Temp(Src) 98.2 F (36.8 C) (Oral)  Resp 16  Ht 5\' 5"  (1.651 m)  Wt 119 lb 4.8 oz (54.114 kg)  BMI 19.85 kg/m2  SpO2 100% Physical Exam General: middle aged man sitting up in bed, NAD HEENT: [place]/AT, EOMI, mucus membranes moist CV: RRR, no m/g/r Pulm: CTA bilaterally, breaths non-labored Abd: BS+, soft, non-tender, non-distended Ext: warm, no edema, moves all Neuro: alert and oriented x 3, no focal deficits  Discharge Labs:  Results for orders placed or performed during the hospital encounter of 07/01/14 (from the past 24 hour(s))  Urine rapid drug screen (hosp performed)     Status: None   Collection Time: 07/02/14 12:30 PM  Result Value Ref Range   Opiates NONE DETECTED NONE DETECTED   Cocaine NONE DETECTED NONE DETECTED   Benzodiazepines NONE DETECTED NONE DETECTED   Amphetamines NONE DETECTED NONE DETECTED   Tetrahydrocannabinol NONE DETECTED NONE DETECTED   Barbiturates NONE DETECTED NONE DETECTED  Renal function panel     Status: Abnormal   Collection Time: 07/03/14  8:39 AM  Result Value Ref Range   Sodium 135 135 - 145 mmol/L   Potassium 3.6 3.5 - 5.1 mmol/L   Chloride 104 101 - 111 mmol/L   CO2 24 22 - 32 mmol/L   Glucose, Bld 95 65 - 99 mg/dL   BUN 32 (H) 6 - 20 mg/dL   Creatinine, Ser 1.01 0.61 - 1.24 mg/dL   Calcium 9.2 8.9 - 10.3 mg/dL   Phosphorus 3.2 2.5 - 4.6 mg/dL   Albumin 3.3 (L) 3.5 - 5.0 g/dL   GFR calc non Af Amer >60 >60 mL/min   GFR calc Af Amer >60 >60 mL/min   Anion gap  7 5 - 15    Signed: Juliet Rude, MD 07/03/2014,  11:03 AM    Services Ordered on Discharge: None  Equipment Ordered on Discharge: None

## 2014-07-04 LAB — HEAVY METALS PROFILE, URINE
ARSENIC(INORGANIC), U: NOT DETECTED ug/L (ref 0–19)
Arsenic (Total),U: NOT DETECTED ug/L (ref 0–50)
CREATININE(CRT), U: 2.25 g/L (ref 0.30–3.00)
LEAD RANDOM URINE: NOT DETECTED ug/L (ref 0–49)
Mercury, Ur: NOT DETECTED ug/L (ref 0–19)

## 2014-07-09 ENCOUNTER — Telehealth: Payer: Self-pay | Admitting: Internal Medicine

## 2014-07-09 NOTE — Telephone Encounter (Signed)
Call to patient to confirm appointment for 07/10/14 at 2:15 mail boxes are full

## 2014-07-10 ENCOUNTER — Ambulatory Visit (INDEPENDENT_AMBULATORY_CARE_PROVIDER_SITE_OTHER): Payer: Self-pay | Admitting: Internal Medicine

## 2014-07-10 ENCOUNTER — Encounter: Payer: Self-pay | Admitting: Internal Medicine

## 2014-07-10 VITALS — BP 124/61 | HR 107 | Temp 99.0°F

## 2014-07-10 DIAGNOSIS — E871 Hypo-osmolality and hyponatremia: Secondary | ICD-10-CM

## 2014-07-10 DIAGNOSIS — N179 Acute kidney failure, unspecified: Secondary | ICD-10-CM

## 2014-07-10 DIAGNOSIS — Z599 Problem related to housing and economic circumstances, unspecified: Secondary | ICD-10-CM

## 2014-07-10 DIAGNOSIS — A0472 Enterocolitis due to Clostridium difficile, not specified as recurrent: Secondary | ICD-10-CM

## 2014-07-10 DIAGNOSIS — A047 Enterocolitis due to Clostridium difficile: Secondary | ICD-10-CM

## 2014-07-10 DIAGNOSIS — R569 Unspecified convulsions: Secondary | ICD-10-CM

## 2014-07-10 DIAGNOSIS — Z598 Other problems related to housing and economic circumstances: Secondary | ICD-10-CM

## 2014-07-10 NOTE — Patient Instructions (Signed)
Golden Hurter, our Education officer, museum, will be calling you. Please answer her call! She can either speak over the phone or set up a meeting in person to discuss options for financial assistance, disability etc.  Please STOP taking your potassium supplement. I will call you if your labs show that you need any other medication changes.  General Instructions:   Thank you for bringing your medicines today. This helps [place] keep you safe from mistakes.   Progress Toward Treatment Goals:  Treatment Goal 06/13/2014  Blood pressure -  Stop smoking smoking the same amount  Prevent falls -    Self Care Goals & Plans:  Self Care Goal 07/01/2014  Manage my medications take my medicines as prescribed; bring my medications to every visit; refill my medications on time  Monitor my health -  Eat healthy foods eat more vegetables; eat foods that are low in salt; eat baked foods instead of fried foods; drink diet soda or water instead of juice or soda  Be physically active find an activity I enjoy  Prevent falls -  Meeting treatment goals -    No flowsheet data found.   Care Management & Community Referrals:  Referral 02/01/2013  Referrals made for care management support none needed

## 2014-07-10 NOTE — Progress Notes (Signed)
[facility] Subjective:   Patient ID: Joseph Underwood male   DOB: Jun 11, 1965 49 y.o.   MRN: [id number]  HPI: Joseph Underwood is a 49 y.o. male with a history of HTN and alcohol abuse with multiple hospitalizations for withdrawal and alcohol-related seizures who presents to clinic for hospital follow up. He was recently admitted from clinic for dizziness, nausea and diarrhea when he was found to be in acute renal failure with hyponatremia. He has been doing very well since discharge and is no longer bothered by any of these symptoms.   Dizziness He was initially orthostatic in the hospital, but this resolved with hydration and discontinuation of his lisinopril (which had been started recently). He had horizontal nystagmus prior to his admission last week, together all concerning for Wernicke's encephalopathy, given his alcohol abuse history. He has been taking his thiamine 100 mg by mouth daily.  Nausea/Diarrhea Patient was found to have c difficile on his initial hospital labs. It is thought that this was causing his pre-hospitalization diarrhea. He is nearly finished with his course of metronidazole. His bowel movements continue to be slightly softer than his baseline, but he reports that the last time he had watery diarrhea was prior to hospital discharge.   Acute Renal Failure Patient's creatinine was 6.42 in clinic prior to his admission last week. It was thought to be prerenal and due to volume depletion (c diff infection). His creatinine fell to 1.01 with hydration in the hospital. He has been drinking plenty of fluids at home and has been eating a normal diet.  Hyponatremia Sodium was 130 last week, but was corrected to 135 by discharge. This was likely in the setting of alcohol abuse and ARF.   Past Medical History  Diagnosis Date  . Alcohol abuse   . Hiccups   . HTN (hypertension)   . Alcohol related seizure    Current Outpatient Prescriptions  Medication  Sig Dispense Refill  . amLODipine (NORVASC) 10 MG tablet Take 1 tablet (10 mg total) by mouth daily. 30 tablet 2  . folic acid (FOLVITE) 1 MG tablet Take 1 tablet (1 mg total) by mouth daily. 30 tablet 2  . MAG64 535 (64 MG) MG TBCR TAKE 3 TABLETS (192 MG TOTAL) BY MOUTH 2 (TWO) TIMES DAILY. (Patient taking differently: TAKE 3 TABLETS (192 MG TOTAL) BY MOUTH 3 (three) TIMES DAILY.) 180 tablet 0  . metroNIDAZOLE (FLAGYL) 500 MG tablet Take 1 tablet (500 mg total) by mouth every 8 (eight) hours. Take next dose at [facility] today (07/03/14) and then every 8 hours for next 13 days. 39 tablet 0  . Multiple Vitamin (MULTIVITAMIN WITH MINERALS) TABS tablet Take 1 tablet by mouth daily. 30 tablet 2  . phenytoin (DILANTIN) 300 MG ER capsule Take 1 capsule (300 mg total) by mouth at bedtime. 30 capsule 3  . Tetrahydrozoline HCl (VISINE OP) Place 1 drop into both eyes daily as needed (dry eyes).    . thiamine (VITAMIN B-1) 100 MG tablet Take 1 tablet (100 mg total) by mouth daily. 30 tablet 6   No current facility-administered medications for this visit.   Family History  Problem Relation Age of Onset  . Hypertension Mother    History   Social History  . Marital Status: Single    Spouse Name: N/A  . Number of Children: N/A  . Years of Education: N/A   Social History Main Topics  . Smoking status: Current Every Day Smoker -- 0.50  packs/day for 37 years    Types: Cigarettes  . Smokeless tobacco: Never Used     Comment: 1/2PKD  . Alcohol Use: 52.8 oz/week    14 Cans of beer, 74 Shots of liquor per week     Comment: 12/16/2013 "~ 1 pint of liquor per day"  . Drug Use: No  . Sexual Activity: No   Other Topics Concern  . None   Social History Narrative   Review of Systems: General: recent hospitalization Skin: no rashes or lesions HEENT: no headaches, no blurred vision Cardiac: no chest pain or palpitations Respiratory: no shortness of breath GI: much improved (see HPI) Urinary: no  changes Msk: no joint pain or swelling Psychiatric: not currently depressed  Objective:  Physical Exam: Filed Vitals:   07/10/14 1437  BP: 124/61  Pulse: 107  Temp: 99 F (37.2 C)  TempSrc: Oral  SpO2: 100%   Appearance: in NAD, in examining room with mother HEENT: AT/[place], PERRL, EOMi Heart: RRR, normal S1S2 Lungs: CTAB, no wheezes Abdomen: BS+, soft, nontender Extremities: no edema Neurologic: A&Ox3, grossly intact, baseline slight tremor present, appears calm Skin: no rashes or lesions  Assessment & Plan:  Case discussed with Dr. Lynnae January   Acute renal failure ARF during recent hospitalization with creatinine peaking at 6.42. Creatinine has now normalized to 0.72 with hydration and resolution of his GI symptoms.   - Encouraged patient to continue without alcohol and continue with normal diet and plenty of fluids   Hyponatremia Patient was found to be hyponatremic to 130 last week. However, his sodium has now normalized to 138 with resolution of his acute renal failure.  - Patient was encouraged to continue to abstain from alcohol   C. difficile colitis Patient's diarrhea has resolved; occasional loose BMs now (1-2 per day).   - Continuing course of metronidazole (3 days remain)   Financial difficulties Patient is currently not working. His mother is requesting help with finances (food stamps, social security).  - Social work referral placed; requested that [facility] call patient to set up meeting.   Patient also requested a meeting with social work for financial assistance - placed  Medications Ordered No orders of the defined types were placed in this encounter.   Other Orders Orders Placed This Encounter  Procedures  . Basic metabolic panel  . Phenytoin (Dilantin)  . Ambulatory referral to Social Work    Referral Priority:  Routine    Referral Type:  Consultation    Referral Reason:  Specialty Services Required    Number of Visits Requested:  1

## 2014-07-11 ENCOUNTER — Telehealth: Payer: Self-pay | Admitting: Licensed Clinical Social Worker

## 2014-07-11 DIAGNOSIS — E871 Hypo-osmolality and hyponatremia: Secondary | ICD-10-CM | POA: Insufficient documentation

## 2014-07-11 LAB — BASIC METABOLIC PANEL
BUN: 9 mg/dL (ref 6–23)
CO2: 28 mEq/L (ref 19–32)
CREATININE: 0.72 mg/dL (ref 0.50–1.35)
Calcium: 9.4 mg/dL (ref 8.4–10.5)
Chloride: 103 mEq/L (ref 96–112)
Glucose, Bld: 120 mg/dL — ABNORMAL HIGH (ref 70–99)
POTASSIUM: 4.2 meq/L (ref 3.5–5.3)
Sodium: 138 mEq/L (ref 135–145)

## 2014-07-11 LAB — PHENYTOIN LEVEL, TOTAL: PHENYTOIN LVL: 4.1 ug/mL — AB (ref 10.0–20.0)

## 2014-07-11 NOTE — Telephone Encounter (Signed)
CSW placed call to Mr. Martinique, but the cell phone dropped call.  Mr. Martinique returned call to [place] from the home number.  Pt states he is currently out of work and unable to work because he has decreased strength and is unable climb d/t to dizziness.  CSW inquired if pt was looking into applying for disability, pt states "yes, but I don't know how"  CSW briefly explained process and will send additional contact information in the mail to Mr. Martinique.  Pt is currently not insured and does not receive any gov't assistance.  During appointment pt/family mentioned applying for food stamps.  Pt states he would like to apply but again does not know how to access resources.  CSW informed pt of agency for food stamps and will send this information as well as information on [place].  CSW will send information on community resources in the mail to Mr. Martinique. Mr. Martinique aware CSW is available to assist as needed.

## 2014-07-11 NOTE — Assessment & Plan Note (Signed)
Patient's diarrhea has resolved; occasional loose BMs now (1-2 per day).   - Continuing course of metronidazole (3 days remain)

## 2014-07-11 NOTE — Assessment & Plan Note (Signed)
ARF during recent hospitalization with creatinine peaking at 6.42. Creatinine has now normalized to 0.72 with hydration and resolution of his GI symptoms.   - Encouraged patient to continue without alcohol and continue with normal diet and plenty of fluids

## 2014-07-11 NOTE — Assessment & Plan Note (Signed)
Patient was found to be hyponatremic to 130 last week. However, his sodium has now normalized to 138 with resolution of his acute renal failure.  - Patient was encouraged to continue to abstain from alcohol

## 2014-07-12 DIAGNOSIS — Z598 Other problems related to housing and economic circumstances: Secondary | ICD-10-CM | POA: Insufficient documentation

## 2014-07-12 DIAGNOSIS — Z599 Problem related to housing and economic circumstances, unspecified: Secondary | ICD-10-CM | POA: Insufficient documentation

## 2014-07-12 NOTE — Assessment & Plan Note (Addendum)
Patient is currently not working. His mother is requesting help with finances (food stamps, social security).  - Social work referral placed; requested that [facility] call patient to set up meeting.

## 2014-07-13 NOTE — Progress Notes (Signed)
Internal Medicine Clinic Attending  Case discussed with Dr. Mallory at the time of the visit.  We reviewed the resident's history and exam and pertinent patient test results.  I agree with the assessment, diagnosis, and plan of care documented in the resident's note. 

## 2014-08-21 ENCOUNTER — Emergency Department (HOSPITAL_COMMUNITY)
Admission: EM | Admit: 2014-08-21 | Discharge: 2014-08-21 | Disposition: A | Discharge status: Home / Self Care | Payer: Self-pay | Attending: Emergency Medicine | Admitting: Emergency Medicine

## 2014-08-21 ENCOUNTER — Encounter (HOSPITAL_COMMUNITY): Payer: Self-pay | Admitting: *Deleted

## 2014-08-21 DIAGNOSIS — G629 Polyneuropathy, unspecified: Secondary | ICD-10-CM | POA: Insufficient documentation

## 2014-08-21 DIAGNOSIS — Z720 Tobacco use: Secondary | ICD-10-CM | POA: Insufficient documentation

## 2014-08-21 DIAGNOSIS — I10 Essential (primary) hypertension: Secondary | ICD-10-CM | POA: Insufficient documentation

## 2014-08-21 DIAGNOSIS — F1010 Alcohol abuse, uncomplicated: Secondary | ICD-10-CM | POA: Insufficient documentation

## 2014-08-21 DIAGNOSIS — R200 Anesthesia of skin: Secondary | ICD-10-CM | POA: Insufficient documentation

## 2014-08-21 DIAGNOSIS — Z9119 Patient's noncompliance with other medical treatment and regimen: Secondary | ICD-10-CM | POA: Insufficient documentation

## 2014-08-21 DIAGNOSIS — G8929 Other chronic pain: Secondary | ICD-10-CM | POA: Insufficient documentation

## 2014-08-21 DIAGNOSIS — M545 Low back pain: Secondary | ICD-10-CM | POA: Insufficient documentation

## 2014-08-21 DIAGNOSIS — R000 Tachycardia, unspecified: Secondary | ICD-10-CM | POA: Insufficient documentation

## 2014-08-21 DIAGNOSIS — Z79899 Other long term (current) drug therapy: Secondary | ICD-10-CM | POA: Insufficient documentation

## 2014-08-21 DIAGNOSIS — Z9114 Patient's other noncompliance with medication regimen: Secondary | ICD-10-CM

## 2014-08-21 DIAGNOSIS — M79606 Pain in leg, unspecified: Secondary | ICD-10-CM | POA: Insufficient documentation

## 2014-08-21 DIAGNOSIS — M791 Myalgia, unspecified site: Secondary | ICD-10-CM

## 2014-08-21 LAB — BASIC METABOLIC PANEL
Anion gap: 14 (ref 5–15)
BUN: 16 mg/dL (ref 6–20)
CO2: 26 mmol/L (ref 22–32)
Calcium: 9 mg/dL (ref 8.9–10.3)
Chloride: 99 mmol/L — ABNORMAL LOW (ref 101–111)
Creatinine, Ser: 0.81 mg/dL (ref 0.61–1.24)
GFR calc non Af Amer: >60 mL/min (ref >60)
GLUCOSE: 78 mg/dL (ref 65–99)
POTASSIUM: 4.1 mmol/L (ref 3.5–5.1)
SODIUM: 139 mmol/L (ref 135–145)

## 2014-08-21 LAB — PHENYTOIN LEVEL, TOTAL: Phenytoin Lvl: <2.5 ug/mL — ABNORMAL LOW (ref 10.0–20.0)

## 2014-08-21 LAB — CBC
HCT: 36.2 % — ABNORMAL LOW (ref 39.0–52.0)
Hemoglobin: 12.1 g/dL — ABNORMAL LOW (ref 13.0–17.0)
MCH: 30.3 pg (ref 26.0–34.0)
MCHC: 33.4 g/dL (ref 30.0–36.0)
MCV: 90.5 fL (ref 78.0–100.0)
Platelets: 340 10*3/uL (ref 150–400)
RBC: 4 MIL/uL — ABNORMAL LOW (ref 4.22–5.81)
RDW: 13.2 % (ref 11.5–15.5)
WBC: 7.8 10*3/uL (ref 4.0–10.5)

## 2014-08-21 LAB — ETHANOL: ALCOHOL ETHYL (B): 314 mg/dL — AB (ref <5)

## 2014-08-21 MED ORDER — CHLORDIAZEPOXIDE HCL 25 MG PO CAPS: ORAL_CAPSULE | ORAL | Status: DC

## 2014-08-21 MED ORDER — PHENYTOIN SODIUM EXTENDED 100 MG PO CAPS: 300.0000 mg | ORAL_CAPSULE | Freq: Every day | ORAL | Status: DC

## 2014-08-21 MED ORDER — PHENYTOIN SODIUM 50 MG/ML IJ SOLN
100.0000 mg | Freq: Once | INTRAMUSCULAR | Status: AC
  Administered 2014-08-21: 100 mg via INTRAVENOUS
  Filled 2014-08-21: qty 2

## 2014-08-21 MED ORDER — LABETALOL HCL 5 MG/ML IV SOLN
10.0000 mg | Freq: Once | INTRAVENOUS | Status: AC
  Administered 2014-08-21: 10 mg via INTRAVENOUS
  Filled 2014-08-21: qty 4

## 2014-08-21 MED ORDER — SODIUM CHLORIDE 0.9 % IV SOLN
400.0000 mg | Freq: Once | INTRAVENOUS | Status: AC
  Administered 2014-08-21: 400 mg via INTRAVENOUS
  Filled 2014-08-21: qty 8

## 2014-08-21 MED ORDER — AMLODIPINE BESYLATE 10 MG PO TABS
10.0000 mg | ORAL_TABLET | Freq: Once | ORAL | Status: AC
  Administered 2014-08-21: 10 mg via ORAL
  Filled 2014-08-21: qty 1

## 2014-08-21 MED ORDER — LABETALOL HCL 5 MG/ML IV SOLN: 5.0000 mg | Freq: Once | INTRAVENOUS | Status: DC

## 2014-08-21 MED ORDER — AMLODIPINE BESYLATE 10 MG PO TABS: 10.0000 mg | ORAL_TABLET | Freq: Every day | ORAL | Status: DC

## 2014-08-21 NOTE — ED Notes (Addendum)
Per EMS, pt complains of generalized chronic leg/back pain and alcohol intoxication/detox. Pt states he had a half pint of liquor today. Pt was tachy and hypertensive upon EMS arrival, pt states he ran out of his regular medications. CBG 116. Pt dressed in his work clothes, but states he has not worked in 2 months. Pt is A&Ox4.

## 2014-08-21 NOTE — ED Notes (Signed)
PA at bedside.

## 2014-08-21 NOTE — ED Provider Notes (Signed)
Care assumed from [facility], PA-C at shift change. He complains of wanting alcohol detox. Is being given his dilantin now and plan is to await pt to be clinically sober then d/c home with his home dilantin and norvasc doses (written by Lucien Mons).   Physical Exam  BP 127/92 mmHg  Pulse 102  Temp(Src) 97.5 F (36.4 C) (Oral)  Resp 18  Wt 119 lb (53.978 kg)  SpO2 94%  Physical Exam Gen: afebrile, VSS, NAD HEENT: EOMI, MMM Resp: no resp distress CV: rate mildly tachycardic Abd: appearance normal, nondistended MsK: moving all extremities with ease, ambulatory without difficulty Neuro: A&O x4  ED Course  Procedures Results for orders placed or performed during the hospital encounter of 08/21/14  CBC  Result Value Ref Range   WBC 7.8 4.0 - 10.5 K/uL   RBC 4.00 (L) 4.22 - 5.81 MIL/uL   Hemoglobin 12.1 (L) 13.0 - 17.0 g/dL   HCT 36.2 (L) 39.0 - 52.0 %   MCV 90.5 78.0 - 100.0 fL   MCH 30.3 26.0 - 34.0 pg   MCHC 33.4 30.0 - 36.0 g/dL   RDW 13.2 11.5 - 15.5 %   Platelets 340 150 - 400 K/uL  Basic metabolic panel  Result Value Ref Range   Sodium 139 135 - 145 mmol/L   Potassium 4.1 3.5 - 5.1 mmol/L   Chloride 99 (L) 101 - 111 mmol/L   CO2 26 22 - 32 mmol/L   Glucose, Bld 78 65 - 99 mg/dL   BUN 16 6 - 20 mg/dL   Creatinine, Ser 0.81 0.61 - 1.24 mg/dL   Calcium 9.0 8.9 - 10.3 mg/dL   GFR calc non Af Amer >60 >60 mL/min   GFR calc Af Amer >60 >60 mL/min   Anion gap 14 5 - 15  Ethanol  Result Value Ref Range   Alcohol, Ethyl (B) 314 (HH) <5 mg/dL  Phenytoin level, total  Result Value Ref Range   Phenytoin Lvl <2.5 (L) 10.0 - 20.0 ug/mL   No results found.   Meds ordered this encounter  Medications  . amLODipine (NORVASC) tablet 10 mg    Sig:   . DISCONTD: labetalol (NORMODYNE,TRANDATE) injection 5 mg    Sig:   . labetalol (NORMODYNE,TRANDATE) injection 10 mg    Sig:   . phenytoin (DILANTIN) 400 mg in sodium chloride 0.9 % 100 mL IVPB    Sig:   . phenytoin  (DILANTIN) injection 100 mg    Sig:   . amLODipine (NORVASC) 10 MG tablet    Sig: Take 1 tablet (10 mg total) by mouth daily.    Dispense:  30 tablet    Refill:  0    Order Specific Question:  Supervising Provider    Answer:  MILLER, BRIAN [[id number]]  . phenytoin (DILANTIN) 100 MG ER capsule    Sig: Take 3 capsules (300 mg total) by mouth at bedtime.    Dispense:  30 capsule    Refill:  0    Order Specific Question:  Supervising Provider    Answer:  MILLER, BRIAN [[id number]]  . chlordiazePOXIDE (LIBRIUM) 25 MG capsule    Sig: 50mg  PO TID x 1D, then 25 mg PO BID X 1D, then 25 mg PO QD X 1D    Dispense:  10 capsule    Refill:  0    Order Specific Question:  Supervising Provider    Answer:  Noemi Chapel [[id number]]     MDM:   ICD-9-CM ICD-10-CM  1. Alcohol abuse 305.00 F10.10   2. Non compliance w medication regimen V15.81 Z91.14   3. Myalgia 729.1 M79.1   4. Peripheral neuropathy 356.9 G62.9   5. HTN (hypertension), benign 401.1 I10     6:38 PM Pt ambulatory, clinically sober, stable for discharge. Discussed f/up with PCP, and using resource guide to find substance abuse help. Agreed to give librium taper to help with withdrawal. Norvasc and dilantin rx written by Bailey Mech. I explained the diagnosis and have given explicit precautions to return to the ER including for any other new or worsening symptoms. The patient understands and accepts the medical plan as it's been dictated and I have answered their questions. Discharge instructions concerning home care and prescriptions have been given. The patient is STABLE and is discharged to home in good condition.  BP 127/92 mmHg  Pulse 101  Temp(Src) 98.7 F (37.1 C) (Oral)  Resp 17  Wt 119 lb (53.978 kg)  SpO2 97%   Joesph Marcy Camprubi-Soms, PA-C 08/21/14 1839  Serita Grit, MD 08/23/14 1030

## 2014-08-21 NOTE — ED Notes (Signed)
Pt c/o pain at 9/10 in abdomen, back, and feet and states that his feet feel tingly.  He reports ingesting at least 1/2 pint of vodka today and acknowledges that his drinking is in excess.  He is calm and pleasant, and his mother is at the bedside.

## 2014-08-21 NOTE — ED Notes (Signed)
Bed: XQ82 Expected date:  Expected time:  Means of arrival:  Comments: Ems- weakness/ ETOH

## 2014-08-21 NOTE — Discharge Instructions (Signed)
You need to stop drinking alcohol as this is affecting your health. Use the librium medication to help the withdrawal symptoms from alcohol but do NOT COMBINE WITH ALCOHOL. Refer to the resource guide below to find a detox and substance abuse center. Take your home medications as directed. Follow up with your regular doctor for recheck of your symptoms. Return to the ER for changes or worsening symptoms.  Alcohol Intoxication Alcohol intoxication occurs when the amount of alcohol that a person has consumed impairs his or her ability to mentally and physically function. Alcohol directly impairs the normal chemical activity of the brain. Drinking large amounts of alcohol can lead to changes in mental function and behavior, and it can cause many physical effects that can be harmful.  Alcohol intoxication can range in severity from mild to very severe. Various factors can affect the level of intoxication that occurs, such as the person's age, gender, weight, frequency of alcohol consumption, and the presence of other medical conditions (such as diabetes, seizures, or heart conditions). Dangerous levels of alcohol intoxication may occur when people drink large amounts of alcohol in a short period (binge drinking). Alcohol can also be especially dangerous when combined with certain prescription medicines or "recreational" drugs. SIGNS AND SYMPTOMS Some common signs and symptoms of mild alcohol intoxication include:  Loss of coordination.  Changes in mood and behavior.  Impaired judgment.  Slurred speech. As alcohol intoxication progresses to more severe levels, other signs and symptoms will appear. These may include:  Vomiting.  Confusion and impaired memory.  Slowed breathing.  Seizures.  Loss of consciousness. DIAGNOSIS  Your health care provider will take a medical history and perform a physical exam. You will be asked about the amount and type of alcohol you have consumed. Blood tests will  be done to measure the concentration of alcohol in your blood. In many places, your blood alcohol level must be lower than 80 mg/dL (0.08%) to legally drive. However, many dangerous effects of alcohol can occur at much lower levels.  TREATMENT  People with alcohol intoxication often do not require treatment. Most of the effects of alcohol intoxication are temporary, and they go away as the alcohol naturally leaves the body. Your health care provider will monitor your condition until you are stable enough to go home. Fluids are sometimes given through an IV access tube to help prevent dehydration.  HOME CARE INSTRUCTIONS  Do not drive after drinking alcohol.  Stay hydrated. Drink enough water and fluids to keep your urine clear or pale yellow. Avoid caffeine.   Only take over-the-counter or prescription medicines as directed by your health care provider.  SEEK MEDICAL CARE IF:   You have persistent vomiting.   You do not feel better after a few days.  You have frequent alcohol intoxication. Your health care provider can help determine if you should see a substance use treatment counselor. SEEK IMMEDIATE MEDICAL CARE IF:   You become shaky or tremble when you try to stop drinking.   You shake uncontrollably (seizure).   You throw up (vomit) blood. This may be bright red or may look like black coffee grounds.   You have blood in your stool. This may be bright red or may appear as a black, tarry, bad smelling stool.   You become lightheaded or faint.  MAKE SURE YOU:   Understand these instructions.  Will watch your condition.  Will get help right away if you are not doing well or get worse. Document  Released: 11/10/2004 Document Revised: 10/03/2012 Document Reviewed: 07/06/2012 [facility] Patient Information 2015 [place] [address], [place]. This information is not intended to replace advice given to you by your health care provider. Make sure you discuss any questions you have with your  health care provider.  Alcohol Use Disorder Alcohol use disorder is a mental disorder. It is not a one-time incident of heavy drinking. Alcohol use disorder is the excessive and uncontrollable use of alcohol over time that leads to problems with functioning in one or more areas of daily living. People with this disorder risk harming themselves and others when they drink to excess. Alcohol use disorder also can cause other mental disorders, such as mood and anxiety disorders, and serious physical problems. People with alcohol use disorder often misuse other drugs.  Alcohol use disorder is common and widespread. Some people with this disorder drink alcohol to cope with or escape from negative life events. Others drink to relieve chronic pain or symptoms of mental illness. People with a family history of alcohol use disorder are at higher risk of losing control and using alcohol to excess.  SYMPTOMS  Signs and symptoms of alcohol use disorder may include the following:   Consumption ofalcohol inlarger amounts or over a longer period of time than intended.  Multiple unsuccessful attempts to cutdown or control alcohol use.   A great deal of time spent obtaining alcohol, using alcohol, or recovering from the effects of alcohol (hangover).  A strong desire or urge to use alcohol (cravings).   Continued use of alcohol despite problems at work, school, or home because of alcohol use.   Continued use of alcohol despite problems in relationships because of alcohol use.  Continued use of alcohol in situations when it is physically hazardous, such as driving a car.  Continued use of alcohol despite awareness of a physical or psychological problem that is likely related to alcohol use. Physical problems related to alcohol use can involve the brain, heart, liver, stomach, and intestines. Psychological problems related to alcohol use include intoxication, depression, anxiety, psychosis, delirium, and  dementia.   The need for increased amounts of alcohol to achieve the same desired effect, or a decreased effect from the consumption of the same amount of alcohol (tolerance).  Withdrawal symptoms upon reducing or stopping alcohol use, or alcohol use to reduce or avoid withdrawal symptoms. Withdrawal symptoms include:  Racing heart.  Hand tremor.  Difficulty sleeping.  Nausea.  Vomiting.  Hallucinations.  Restlessness.  Seizures. DIAGNOSIS Alcohol use disorder is diagnosed through an assessment by your health care provider. Your health care provider may start by asking three or four questions to screen for excessive or problematic alcohol use. To confirm a diagnosis of alcohol use disorder, at least two symptoms must be present within a 73-month period. The severity of alcohol use disorder depends on the number of symptoms:  Mild--two or three.  Moderate--four or five.  Severe--six or more. Your health care provider may perform a physical exam or use results from lab tests to see if you have physical problems resulting from alcohol use. Your health care provider may refer you to a mental health professional for evaluation. TREATMENT  Some people with alcohol use disorder are able to reduce their alcohol use to low-risk levels. Some people with alcohol use disorder need to quit drinking alcohol. When necessary, mental health professionals with specialized training in substance use treatment can help. Your health care provider can help you decide how severe your alcohol use disorder is  and what type of treatment you need. The following forms of treatment are available:   Detoxification. Detoxification involves the use of prescription medicines to prevent alcohol withdrawal symptoms in the first week after quitting. This is important for people with a history of symptoms of withdrawal and for heavy drinkers who are likely to have withdrawal symptoms. Alcohol withdrawal can be dangerous  and, in severe cases, cause death. Detoxification is usually provided in a hospital or in-patient substance use treatment facility.  Counseling or talk therapy. Talk therapy is provided by substance use treatment counselors. It addresses the reasons people use alcohol and ways to keep them from drinking again. The goals of talk therapy are to help people with alcohol use disorder find healthy activities and ways to cope with life stress, to identify and avoid triggers for alcohol use, and to handle cravings, which can cause relapse.  Medicines.Different medicines can help treat alcohol use disorder through the following actions:  Decrease alcohol cravings.  Decrease the positive reward response felt from alcohol use.  Produce an uncomfortable physical reaction when alcohol is used (aversion therapy).  Support groups. Support groups are run by people who have quit drinking. They provide emotional support, advice, and guidance. These forms of treatment are often combined. Some people with alcohol use disorder benefit from intensive combination treatment provided by specialized substance use treatment centers. Both inpatient and outpatient treatment programs are available. Document Released: 03/10/2004 Document Revised: 06/17/2013 Document Reviewed: 05/10/2012 [facility] Patient Information 2015 [place], [place]. This information is not intended to replace advice given to you by your health care provider. Make sure you discuss any questions you have with your health care provider.  Alcohol Withdrawal Anytime drug use is interfering with normal living activities it has become abuse. This includes problems with family and friends. Psychological dependence has developed when your mind tells you that the drug is needed. This is usually followed by physical dependence when a continuing increase of drugs are required to get the same feeling or "high." This is known as addiction or chemical dependency. A  person's risk is much higher if there is a history of chemical dependency in the family. Mild Withdrawal Following Stopping Alcohol, When Addiction or Chemical Dependency Has Developed When a person has developed tolerance to alcohol, any sudden stopping of alcohol can cause uncomfortable physical symptoms. Most of the time these are mild and consist of tremors in the hands and increases in heart rate, breathing, and temperature. Sometimes these symptoms are associated with anxiety, panic attacks, and bad dreams. There may also be stomach upset. Normal sleep patterns are often interrupted with periods of inability to sleep (insomnia). This may last for 6 months. Because of this discomfort, many people choose to continue drinking to get rid of this discomfort and to try to feel normal. Severe Withdrawal with Decreased or No Alcohol Intake, When Addiction or Chemical Dependency Has Developed About five percent of alcoholics will develop signs of severe withdrawal when they stop using alcohol. One sign of this is development of generalized seizures (convulsions). Other signs of this are severe agitation and confusion. This may be associated with believing in things which are not real or seeing things which are not really there (delusions and hallucinations). Vitamin deficiencies are usually present if alcohol intake has been long-term. Treatment for this most often requires hospitalization and close observation. Addiction can only be helped by stopping use of all chemicals. This is hard but may save your life. With continual alcohol use,  possible outcomes are usually loss of self respect and esteem, violence, and death. Addiction cannot be cured but it can be stopped. This often requires outside help and the care of professionals. Treatment centers are listed in the yellow pages under Cocaine, Narcotics, and Alcoholics Anonymous. Most hospitals and clinics can refer you to a specialized care center. It is not  necessary for you to go through the uncomfortable symptoms of withdrawal. Your caregiver can provide you with medicines that will help you through this difficult period. Try to avoid situations, friends, or drugs that made it possible for you to keep using alcohol in the past. Learn how to say no. It takes a long period of time to overcome addictions to all drugs, including alcohol. There may be many times when you feel as though you want a drink. After getting rid of the physical addiction and withdrawal, you will have a lessening of the craving which tells you that you need alcohol to feel normal. Call your caregiver if more support is needed. Learn who to talk to in your family and among your friends so that during these periods you can receive outside help. Alcoholics Anonymous (AA) has helped many people over the years. To get further help, contact AA or call your caregiver, counselor, or clergyperson. Al-Anon and Alateen are support groups for friends and family members of an alcoholic. The people who love and care for an alcoholic often need help, too. For information about these organizations, check your phone directory or call a local alcoholism treatment center.  SEEK IMMEDIATE MEDICAL CARE IF:   You have a seizure.  You have a fever.  You experience uncontrolled vomiting or you vomit up blood. This may be bright red or look like black coffee grounds.  You have blood in the stool. This may be bright red or appear as a black, tarry, bad-smelling stool.  You become lightheaded or faint. Do not drive if you feel this way. Have someone else drive you or call 017 for help.  You become more agitated or confused.  You develop uncontrolled anxiety.  You begin to see things that are not really there (hallucinate). Your caregiver has determined that you completely understand your medical condition, and that your mental state is back to normal. You understand that you have been treated for alcohol  withdrawal, have agreed not to drink any alcohol for a minimum of 1 day, will not operate a car or other machinery for 24 hours, and have had an opportunity to ask any questions about your condition. Document Released: 11/10/2004 Document Revised: 04/25/2011 Document Reviewed: 09/19/2007 [facility] Patient Information 2015 [place], [place]. This information is not intended to replace advice given to you by your health care provider. Make sure you discuss any questions you have with your health care provider.  Emergency Department Resource Guide 1) Find a Doctor and Pay Out of Pocket Although you won't have to find out who is covered by your insurance plan, it is a good idea to ask around and get recommendations. You will then need to call the office and see if the doctor you have chosen will accept you as a new patient and what types of options they offer for patients who are self-pay. Some doctors offer discounts or will set up payment plans for their patients who do not have insurance, but you will need to ask so you aren't surprised when you get to your appointment.  2) Contact Your Local Health Department Not all health  departments have doctors that can see patients for sick visits, but many do, so it is worth a call to see if yours does. If you don't know where your local health department is, you can check in your phone book. The CDC also has a tool to help you locate your state's health department, and many state websites also have listings of all of their local health departments.  3) Find a [place] [facility] If your illness is not likely to be very severe or complicated, you may want to try a walk in clinic. These are popping up all over the country in pharmacies, drugstores, and shopping centers. They're usually staffed by nurse practitioners or physician assistants that have been trained to treat common illnesses and complaints. They're usually fairly quick and inexpensive. However, if you have  serious medical issues or chronic medical problems, these are probably not your best option.  No Primary Care Doctor: - Call Health Connect at  [phone] - they can help you locate a primary care doctor that  accepts your insurance, provides certain services, etc. - Physician Referral Service- [phone]  Chronic Pain Problems: Organization         Address  Phone   Notes  [facility]  [phone] Patients need to be referred by their primary care doctor.   Medication Assistance: Organization         Address  Phone   Notes  [facility] Medication [facility] [place]., [place], [place] [address] [phone] --Must be a resident of [facility] -- Must have NO insurance coverage whatsoever (no Medicaid/ Medicare, etc.) -- The pt. MUST have a primary care doctor that directs their care regularly and follows them in the community   MedAssist  [phone]   [employer]  [phone]    [place] in the Community: Intensive Outpatient Programs Organization         Address  Phone  Notes  [place] Viola. [address], [place], [place] [phone]   [facility] Outpatient [address] [place], [place]   ADS: Alcohol & Drug Svcs [address], [place], [place]   [place] [address]  [place], [place] or [phone]   Substance Abuse Resources Organization         Address  Phone  Notes  Alcohol and Drug Services  [phone]   [place]  [phone]   [facility] [place]   [place]  [phone]   Residential & Outpatient Substance Abuse Program  [phone]   Psychological Services Organization         Address  Phone  Notes  [facility] [place]  [place]  [phone]   [place] [address], [place] or [phone]    Mobile Crisis Teams Organization         Address  Phone  Notes  [facility], Mobile Crisis Care Unit  [phone]   [facility]  [address] [place], [place]   [place] [address] [place] [phone]    Self-Help/Support Groups Organization         Address  Phone             Notes  [place]. of [place] - variety of support groups  [place] Call for  more information  Narcotics Anonymous (NA), [facility] [address], [employer] [place]  2 meetings at this location   Residential Facilities manager         Address  Phone  Notes  [facility]idential Treatment [place],    [employer]  [phone]   [facility]  [address], [place] [id number], [place], [place]   [place], [place] [phone] Admissions: 8am-3pm M-F  Incentives Substance [place] [address],    [place], [place] [phone]   [facility] [address] [place], [place], [place]   [facility] [place] [address]  [place], [place]   [facility] - Intensive Outpatient [address], [place], [place]   [facility] ([place] [address] [place].,  [place], [place] [phone] or [phone]   [facility]idential Treatment Services (RTS) [address], [place], [place] Accepts Medicaid  [facility]ship [place] [address]  [place] [phone] Substance Abuse/Addiction Treatment   [facility] Organization         Address  Phone  Notes  [facility]  [phone]   Domenic Schwab, PhD [address] [place], [place]   [phone] or [phone]   [place], [place] [phone]   [facility]  [address], [place], [place] [phone] Insurance/Medicaid/sponsorship through [facility] and Families [address]                                    [place], [place] [phone] [place] [address], [place] [phone]    Dr. Adele Schilder  [phone]   [facility] Dept. 1) [address] [place], [place] 2) [place] 3)  [place] [address], [place] [phone]  [phone]   [place] [phone] or [phone] (After Hours)      [place] in the [facility]  Intensive Outpatient Programs: [facility]      [place]. [place], [place] Both a day and evening program       [facility] Outpatient     [address] [place] [address]        [place], [place] [address] [phone]         ADS: Alcohol & Drug Svcs [place]: [phone] or [phone] [address] [place], [place] [address] [email or web address]  Mobile Crisis Teams:                                        [facility]         Mobile Crisis Care Unit [phone]             [facility] [address] [place] [phone]  [place] [address] [place] [phone]  Self-Help/Support Groups: [facility] Health Assoc. of [employer] of support groups [phone] (call for more info)  Narcotics Anonymous (NA) [facility] [address] [place] - 2 meetings at this location  Residential Treatment Programs:  [place]       [place] [address], [place], [place]  [address] [place]  [address] [place], [place] [address] [phone] Admissions: 8am-3pm M-F  Incentives Substance [place]     [address] [place], [place] [address]       [phone]         [facility] [address] Jadene Pierini [place], [place]  [facility] [address] [place], [place]  [facility] - Intensive Outpatient      [address] [place] [address]     [place], [place]         [facility] ([place]     [place], [place] or [phone]  Residential Treatment Services (RTS)  [place], [place]  Fellowship [address]                                               [place]  [facility] Resources: [phone]               General Therapy                                                Domenic Schwab, PhD        [address] [place], [place] [address]         [facility]   [address] [place], [place] [address] [phone]  [facility] Recovery [address] [place], [place] [address] [phone] Insurance/Medicaid/sponsorship through [facility] and Families                                              [place]                                        [place], [place] [address]    Therapy/tele-psych/case         [place], [place]  [address]  Adolescent/group home/case management  [place] PhD       General therapy       Insurance   [phone]         Dr. Adele Schilder Insurance [phone] M-F  [place] [facility]/Residential Medicaid,  sponsorship [id number]

## 2014-08-21 NOTE — ED Provider Notes (Signed)
CSN: [id number]     Arrival date & time 08/21/14  1312 History   First MD Initiated Contact with Patient 08/21/14 1328     Chief Complaint  Patient presents with  . Leg Pain  . Back Pain  . Alcohol Problem     (Consider location/radiation/quality/duration/timing/severity/associated sxs/prior Treatment) HPI Comments: 49 year old male presenting to the EMS with an exacerbation of his chronic leg and back pain. States his thighs have been sore and he has numbness in both of his feet. This has been ongoing "for a while". No alleviating factors tried. States he has been outside and he and drinking alcohol. Back pain located in his lower back, same his chronic pain, no aggravating or alleviating factors, does not radiate into extremities. No loss of control bowels or bladder saddle anesthesia. He had a half a pint of liquor today. He drinks on a daily basis. History of DTs. He is requesting help for detox. States he has not taken his Dilantin in about 4 days and has been off his blood pressure medications for 3 days due to running out. Denies fever, chills, nausea, vomiting, chest pain or shortness of breath. His last seizure was about 7 months ago.  Patient is a 49 y.o. male presenting with leg pain, back pain, and alcohol problem. The history is provided by the patient and the EMS personnel.  Leg Pain Associated symptoms: back pain   Back Pain Associated symptoms: leg pain and numbness (BL feet)   Alcohol Problem Associated symptoms include arthralgias, myalgias and numbness (BL feet).    Past Medical History  Diagnosis Date  . Alcohol abuse   . Hiccups   . HTN (hypertension)   . Alcohol related seizure    Past Surgical History  Procedure Laterality Date  . No past surgeries     Family History  Problem Relation Age of Onset  . Hypertension Mother    History  Substance Use Topics  . Smoking status: Current Every Day Smoker -- 0.50 packs/day for 37 years    Types: Cigarettes  .  Smokeless tobacco: Never Used     Comment: 1/2PKD  . Alcohol Use: 52.8 oz/week    14 Cans of beer, 74 Shots of liquor per week     Comment: 12/16/2013 "~ 1 pint of liquor per day"    Review of Systems  Musculoskeletal: Positive for myalgias, back pain and arthralgias.  Neurological: Positive for numbness (BL feet).  Psychiatric/Behavioral: Positive for behavioral problems (ETOH abuse).  All other systems reviewed and are negative.     Allergies  Review of patient's allergies indicates no known allergies.  Home Medications   Prior to Admission medications   Medication Sig Start Date End Date Taking? Authorizing Provider  amLODipine (NORVASC) 10 MG tablet Take 1 tablet (10 mg total) by mouth daily. 03/21/14  Yes Wilber Oliphant, MD  folic acid (FOLVITE) 1 MG tablet Take 1 tablet (1 mg total) by mouth daily. 07/03/14  Yes Carly J Rivet, MD  MAG64 535 (64 MG) MG TBCR TAKE 3 TABLETS (192 MG TOTAL) BY MOUTH 2 (TWO) TIMES DAILY. 06/20/14  Yes Karlene Einstein, MD  Multiple Vitamin (MULTIVITAMIN WITH MINERALS) TABS tablet Take 1 tablet by mouth daily. 07/03/14  Yes Carly Montey Hora, MD  phenytoin (DILANTIN) 300 MG ER capsule Take 1 capsule (300 mg total) by mouth at bedtime. 07/03/14  Yes Carly Montey Hora, MD  Tetrahydrozoline HCl (VISINE OP) Place 1 drop into both eyes daily as needed (dry  eyes).   Yes Historical Provider, MD  thiamine (VITAMIN B-1) 100 MG tablet Take 1 tablet (100 mg total) by mouth daily. 04/15/14  Yes Otho Bellows, MD  metroNIDAZOLE (FLAGYL) 500 MG tablet Take 1 tablet (500 mg total) by mouth every 8 (eight) hours. Take next dose at [facility] today (07/03/14) and then every 8 hours for next 13 days. Patient not taking: Reported on 08/21/2014 07/03/14   Sindy Guadeloupe Rivet, MD   BP 152/95 mmHg  Pulse 100  Temp(Src) 97.5 F (36.4 C) (Oral)  Resp 18  SpO2 98% Physical Exam  Constitutional: He is oriented to person, place, and time. He appears well-developed and well-nourished. No distress.   HENT:  Head: Normocephalic and atraumatic.  Eyes: Conjunctivae and EOM are normal.  Neck: Normal range of motion. Neck supple.  Cardiovascular: Normal rate, regular rhythm and normal heart sounds.   Pulses:      Dorsalis pedis pulses are 2+ on the right side, and 2+ on the left side.       Posterior tibial pulses are 2+ on the right side, and 2+ on the left side.  Pulmonary/Chest: Effort normal and breath sounds normal.  Musculoskeletal: Normal range of motion. He exhibits no edema.  BL legs non-tender. No swelling, erythema or warmth.  Neurological: He is alert and oriented to person, place, and time.  Decreased sensation to light touch bilateral feet in no specific dermatomal pattern. Lower extremities warm with normal pulses.  Skin: Skin is warm and dry.  Cap refill < 2 seconds.  Psychiatric: He expresses no homicidal and no suicidal ideation.  Tangential speech.  Nursing note and vitals reviewed.   ED Course  Procedures (including critical care time) Labs Review Labs Reviewed  CBC - Abnormal; Notable for the following:    RBC 4.00 (*)    Hemoglobin 12.1 (*)    HCT 36.2 (*)    All other components within normal limits  BASIC METABOLIC PANEL - Abnormal; Notable for the following:    Chloride 99 (*)    All other components within normal limits  ETHANOL - Abnormal; Notable for the following:    Alcohol, Ethyl (B) 314 (*)    All other components within normal limits  PHENYTOIN LEVEL, TOTAL - Abnormal; Notable for the following:    Phenytoin Lvl <2.5 (*)    All other components within normal limits    Imaging Review No results found.   EKG Interpretation None      MDM   Final diagnoses:  Alcohol abuse  Non compliance w medication regimen  Myalgia  Peripheral neuropathy   Non-toxic appearing, NAD. Tachycardic and hypertensive on arrival. Has been out of his blood pressure medications at home. His home dose of Norvasc given in the ED along with labetalol with  improvement of his blood pressure and pulse. Pulse now 100 with a blood pressure of 152/95. Regarding leg cramping and bilateral foot numbness, his legs are nontender, has no swelling and good blood flow. This is most likely due to peripheral neuropathy. CBC, BMP within normal limits. Phenytoin level less than 2.5. Loading dose 500 mg given. After dosing of phenytoin, as long at pt is able to ambulate (which is he is able to do currently without any difficulty), and clinically sober, he can be d/c home with his home medications and outpatient resources for detox. Pt signed out to [employer], PA-Cat shfit change.  Discussed with attending Dr. Doy Mince who agrees with plan of care.  Bailey Mech  Cletus Gash, PA-C 08/21/14 1622  Serita Grit, MD 08/23/14 1029

## 2014-11-05 ENCOUNTER — Ambulatory Visit: Payer: Self-pay

## 2014-11-12 ENCOUNTER — Ambulatory Visit: Payer: Self-pay

## 2014-12-03 ENCOUNTER — Encounter: Payer: Self-pay | Admitting: Internal Medicine

## 2014-12-03 ENCOUNTER — Ambulatory Visit (INDEPENDENT_AMBULATORY_CARE_PROVIDER_SITE_OTHER): Payer: Self-pay | Admitting: Internal Medicine

## 2014-12-03 VITALS — BP 122/76 | HR 102 | Temp 98.9°F | Wt 117.4 lb

## 2014-12-03 DIAGNOSIS — G4700 Insomnia, unspecified: Secondary | ICD-10-CM

## 2014-12-03 DIAGNOSIS — M67431 Ganglion, right wrist: Secondary | ICD-10-CM

## 2014-12-03 DIAGNOSIS — F1020 Alcohol dependence, uncomplicated: Secondary | ICD-10-CM

## 2014-12-03 DIAGNOSIS — G621 Alcoholic polyneuropathy: Secondary | ICD-10-CM

## 2014-12-03 DIAGNOSIS — Z23 Encounter for immunization: Secondary | ICD-10-CM

## 2014-12-03 DIAGNOSIS — R569 Unspecified convulsions: Secondary | ICD-10-CM

## 2014-12-03 DIAGNOSIS — F1029 Alcohol dependence with unspecified alcohol-induced disorder: Secondary | ICD-10-CM

## 2014-12-03 DIAGNOSIS — K7689 Other specified diseases of liver: Secondary | ICD-10-CM

## 2014-12-03 DIAGNOSIS — M6740 Ganglion, unspecified site: Secondary | ICD-10-CM

## 2014-12-03 DIAGNOSIS — F1010 Alcohol abuse, uncomplicated: Secondary | ICD-10-CM

## 2014-12-03 DIAGNOSIS — G40909 Epilepsy, unspecified, not intractable, without status epilepticus: Secondary | ICD-10-CM

## 2014-12-03 MED ORDER — AMLODIPINE BESYLATE 10 MG PO TABS: 10.0000 mg | ORAL_TABLET | Freq: Every day | ORAL | Status: DC

## 2014-12-03 MED ORDER — GABAPENTIN 300 MG PO CAPS: 300.0000 mg | ORAL_CAPSULE | Freq: Three times a day (TID) | ORAL | Status: DC

## 2014-12-03 NOTE — Patient Instructions (Signed)
Mr. Martinique it was nice meeting you today.  -Start taking Gabapentin 300 mg: 1 capsule 3 times daily. You may start with a lower dose as this medication can make you sleepy. DO NOT STOP taking this medication all of a sudden as doing so can cause you to have a seizure.   -Please return to the clinic on 12/08/14 so that we can remove fluid from your cyst.

## 2014-12-05 DIAGNOSIS — G621 Alcoholic polyneuropathy: Secondary | ICD-10-CM | POA: Insufficient documentation

## 2014-12-05 DIAGNOSIS — G4700 Insomnia, unspecified: Secondary | ICD-10-CM | POA: Insufficient documentation

## 2014-12-05 DIAGNOSIS — M6740 Ganglion, unspecified site: Secondary | ICD-10-CM | POA: Insufficient documentation

## 2014-12-05 NOTE — Assessment & Plan Note (Signed)
Patient reports being unable to fall asleep at night and endorses daytime sleepiness. States he drinks soda throughout the day and with dinner. His insomnia is likely related to caffeine use and poor sleep hygiene. Daytime sleepiness not likely due to obstructive sleep apnea because patient is not overweight. -Educated him about sleep hygiene.  -Advised him to cut down on his caffeine intake. No sodas or other caffeinated beverages after lunchtime.

## 2014-12-05 NOTE — Assessment & Plan Note (Signed)
CT from 03/2014: IMPRESSION: Minimal gallbladder sludge is present. 8 mm hyperechoic focus is noted in left hepatic lobe corresponding to abnormality seen on CT scan. In the absence of any history of malignancy, this most likely represents hemangioma. Follow-up ultrasound in 6 months is recommended to ensure stability. If the patient does have a history of malignancy, then metastatic disease cannot be excluded and further evaluation with MRI would be Recommended.  -Order abdominal CT at next visit on 12/08/2014

## 2014-12-05 NOTE — Assessment & Plan Note (Signed)
Patient is currently taking phenytoin and denies having any episodes of seizures since he started taking his medication regularly. -Continue phenytoin 100 mg: 3 capsules daily at bedtime

## 2014-12-05 NOTE — Progress Notes (Signed)
Patient ID: Joseph Underwood, male   DOB: [birth year], 49 y.o.   MRN: [id number]   Subjective:   Patient ID: Joseph Underwood male   DOB: May 12, 1965 49 y.o.   MRN: [id number]  HPI: Joseph Underwood is a 49 y.o. male with a past medical history of alcohol abuse and alcohol withdrawal seizures, hiccups, hypertension presenting to the clinic with a chief complaint of pain in his right wrist. Patient is complaining of painful "knot" that that started on the dorsal aspect of his right wrist a few weeks ago. Denies engaging in any activities that require rapid wrist motion. Denies any other joint pains. Denies having any fevers, chills, or rashes. Also reports having a knot on the lateral aspect of the right wrist for the past 1 year which is not painful.    Past Medical History  Diagnosis Date  . Alcohol abuse   . Hiccups   . HTN (hypertension)   . Alcohol related seizure [facility])    Current Outpatient Prescriptions  Medication Sig Dispense Refill  . amLODipine (NORVASC) 10 MG tablet Take 1 tablet (10 mg total) by mouth daily. 90 tablet 3  . chlordiazePOXIDE (LIBRIUM) 25 MG capsule 50mg  PO TID x 1D, then 25 mg PO BID X 1D, then 25 mg PO QD X 1D 10 capsule 0  . folic acid (FOLVITE) 1 MG tablet Take 1 tablet (1 mg total) by mouth daily. 30 tablet 2  . gabapentin (NEURONTIN) 300 MG capsule Take 1 capsule (300 mg total) by mouth 3 (three) times daily. 90 capsule 2  . MAG64 535 (64 MG) MG TBCR TAKE 3 TABLETS (192 MG TOTAL) BY MOUTH 2 (TWO) TIMES DAILY. 180 tablet 0  . metroNIDAZOLE (FLAGYL) 500 MG tablet Take 1 tablet (500 mg total) by mouth every 8 (eight) hours. Take next dose at [facility] today (07/03/14) and then every 8 hours for next 13 days. (Patient not taking: Reported on 08/21/2014) 39 tablet 0  . Multiple Vitamin (MULTIVITAMIN WITH MINERALS) TABS tablet Take 1 tablet by mouth daily. 30 tablet 2  . phenytoin (DILANTIN) 100 MG ER capsule Take 3 capsules (300 mg total) by mouth at bedtime. 30 capsule 0  .  Tetrahydrozoline HCl (VISINE OP) Place 1 drop into both eyes daily as needed (dry eyes).    . thiamine (VITAMIN B-1) 100 MG tablet Take 1 tablet (100 mg total) by mouth daily. 30 tablet 6   No current facility-administered medications for this visit.   Family History  Problem Relation Age of Onset  . Hypertension Mother    Social History   Social History  . Marital Status: Single    Spouse Name: N/A  . Number of Children: N/A  . Years of Education: N/A   Social History Main Topics  . Smoking status: Current Every Day Smoker -- 0.50 packs/day for 37 years    Types: Cigarettes  . Smokeless tobacco: Never Used     Comment: 1/2PKD  . Alcohol Use: 52.8 oz/week    14 Cans of beer, 74 Shots of liquor per week     Comment: 12/16/2013 "~ 1 pint of liquor per day"  . Drug Use: No  . Sexual Activity: No   Other Topics Concern  . None   Social History Narrative   Review of Systems: Review of Systems  Constitutional: Negative for fever and chills.  HENT: Negative for ear pain.   Eyes: Negative for blurred vision and pain.  Respiratory: Negative for cough, shortness of  breath and wheezing.        Hiccups  Cardiovascular: Negative for chest pain and leg swelling.  Gastrointestinal: Positive for nausea and vomiting. Negative for abdominal pain, diarrhea, constipation, blood in stool and melena.  Genitourinary: Negative for dysuria, urgency and frequency.  Musculoskeletal: Negative for myalgias and joint pain.  Skin: Negative for itching and rash.  Neurological: Negative for dizziness, sensory change, focal weakness and headaches.   Objective:  Physical Exam: Filed Vitals:   12/03/14 1527  BP: 122/76  Pulse: 102  Temp: 98.9 F (37.2 C)  TempSrc: Oral  Weight: 117 lb 6.4 oz (53.252 kg)  SpO2: 100%   Physical Exam  Constitutional: He is oriented to person, place, and time. He appears well-developed and well-nourished. No distress.  HENT:  Head: Normocephalic and atraumatic.    Eyes: EOM are normal. Pupils are equal, round, and reactive to light.  Neck: Neck supple. No tracheal deviation present.  Cardiovascular: Normal rate, regular rhythm and intact distal pulses.   Pulmonary/Chest: Effort normal. No respiratory distress. He has no wheezes. He has no rales.  Abdominal: Soft. Bowel sounds are normal. He exhibits no distension. There is no tenderness. There is no rebound and no guarding.  Musculoskeletal: Normal range of motion. He exhibits tenderness. He exhibits no edema.  Right wrist: Ganglion cysts noted on the dorsal and lateral aspect of the wrist. Decreased range of motion due to pain. Pain worse with extension of the wrist.  Neurological: He is alert and oriented to person, place, and time.  Decreased sensation to pinprick and light touch in feet bilaterally.  Strength grossly intact in bilateral lower extremities.  Skin: Skin is warm and dry.   Assessment & Plan:

## 2014-12-05 NOTE — Assessment & Plan Note (Signed)
Patient reports having numbness and tingling in his feet. A1c 5.3 in June 2013. B12 normal in February 2016. 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 -Repeat A1c at next visit on 12/08/2014

## 2014-12-05 NOTE — Assessment & Plan Note (Addendum)
Patient states he still continues to drink alcohol but not as much as before. States he had 3 beers last Friday and a pint of liquor a few days prior to that. States he is taking magnesium but has stopped taking folic acid and thiamine. Reports having nausea and hiccups for the past 10 years. Denies any associated fevers, chills, or diarrhea. Denies having any symptoms of GERD. Patient's nausea and hiccups are likely related to his chronic alcohol use. -Advised him to cut down on his alcohol use. -Continue magnesium supplementation -Advised him to continue thiamine and folic acid

## 2014-12-05 NOTE — Assessment & Plan Note (Addendum)
2 ganglion cysts noted on the right wrist-one on the dorsal aspect of the wrist which is painful and another on the lateral aspect of the wrist which is not painful to palpation. Pt denies engaging in any activities that require rapid wrist motion. Denies any other joint pains. Denies having any fevers, chills, or rashes. -Patient will be returning to the clinic on 12/08/2014 for aspiration of the painful ganglion cyst

## 2014-12-08 ENCOUNTER — Ambulatory Visit (INDEPENDENT_AMBULATORY_CARE_PROVIDER_SITE_OTHER): Payer: Self-pay | Admitting: Internal Medicine

## 2014-12-08 ENCOUNTER — Telehealth: Payer: Self-pay

## 2014-12-08 ENCOUNTER — Encounter: Payer: Self-pay | Admitting: Internal Medicine

## 2014-12-08 VITALS — BP 126/79 | HR 102 | Temp 98.2°F | Ht 65.0 in | Wt 118.1 lb

## 2014-12-08 DIAGNOSIS — K7689 Other specified diseases of liver: Secondary | ICD-10-CM

## 2014-12-08 DIAGNOSIS — K769 Liver disease, unspecified: Secondary | ICD-10-CM

## 2014-12-08 DIAGNOSIS — M6740 Ganglion, unspecified site: Secondary | ICD-10-CM

## 2014-12-08 DIAGNOSIS — M67431 Ganglion, right wrist: Secondary | ICD-10-CM

## 2014-12-08 DIAGNOSIS — G621 Alcoholic polyneuropathy: Secondary | ICD-10-CM

## 2014-12-08 LAB — POCT GLYCOSYLATED HEMOGLOBIN (HGB A1C): HEMOGLOBIN A1C: 5.3

## 2014-12-08 NOTE — Progress Notes (Signed)
Patient ID: Joseph Underwood, male   DOB: 08-24-1965, 49 y.o.   MRN: [id number]   Subjective:   Patient ID: Joseph Underwood male   DOB: 1965/04/08 49 y.o.   MRN: [id number]  HPI: Joseph Underwood is a 49 y.o. M with a PMHx of alcohol abuse and HTN presenting to the clinic to get the ganglion cyst on the dorsum of his right wrist aspirated. Please see assessment and plan for further details about the procedure.    Past Medical History  Diagnosis Date  . Alcohol abuse   . Hiccups   . HTN (hypertension)   . Alcohol related seizure [facility])    Current Outpatient Prescriptions  Medication Sig Dispense Refill  . amLODipine (NORVASC) 10 MG tablet Take 1 tablet (10 mg total) by mouth daily. 90 tablet 3  . chlordiazePOXIDE (LIBRIUM) 25 MG capsule 50mg  PO TID x 1D, then 25 mg PO BID X 1D, then 25 mg PO QD X 1D 10 capsule 0  . folic acid (FOLVITE) 1 MG tablet Take 1 tablet (1 mg total) by mouth daily. 30 tablet 2  . gabapentin (NEURONTIN) 300 MG capsule Take 1 capsule (300 mg total) by mouth 3 (three) times daily. 90 capsule 2  . MAG64 535 (64 MG) MG TBCR TAKE 3 TABLETS (192 MG TOTAL) BY MOUTH 2 (TWO) TIMES DAILY. 180 tablet 0  . metroNIDAZOLE (FLAGYL) 500 MG tablet Take 1 tablet (500 mg total) by mouth every 8 (eight) hours. Take next dose at [facility] today (07/03/14) and then every 8 hours for next 13 days. (Patient not taking: Reported on 08/21/2014) 39 tablet 0  . Multiple Vitamin (MULTIVITAMIN WITH MINERALS) TABS tablet Take 1 tablet by mouth daily. 30 tablet 2  . phenytoin (DILANTIN) 100 MG ER capsule Take 3 capsules (300 mg total) by mouth at bedtime. 30 capsule 0  . Tetrahydrozoline HCl (VISINE OP) Place 1 drop into both eyes daily as needed (dry eyes).    . thiamine (VITAMIN B-1) 100 MG tablet Take 1 tablet (100 mg total) by mouth daily. 30 tablet 6   No current facility-administered medications for this visit.   Family History  Problem Relation Age of Onset  . Hypertension Mother    Social History     Social History  . Marital Status: Single    Spouse Name: N/A  . Number of Children: N/A  . Years of Education: N/A   Social History Main Topics  . Smoking status: Current Every Day Smoker -- 0.50 packs/day for 37 years    Types: Cigarettes  . Smokeless tobacco: Never Used     Comment: 1/2PKD  . Alcohol Use: 52.8 oz/week    14 Cans of beer, 74 Shots of liquor per week     Comment: 12/16/2013 "~ 1 pint of liquor per day"  . Drug Use: No  . Sexual Activity: No   Other Topics Concern  . None   Social History Narrative    ROS Not done at this visit.   Objective:   Filed Vitals:   12/08/14 1330  BP: 126/79  Pulse: 102  Temp: 98.2 F (36.8 C)  TempSrc: Oral  Height: 5\' 5"  (1.651 m)  Weight: 118 lb 1.6 oz (53.57 kg)  SpO2: 100%   Physical Exam  Constitutional: He is oriented to person, place, and time. He appears well-developed and well-nourished. No distress.  Cardiovascular: Normal rate, regular rhythm and intact distal pulses.   Pulmonary/Chest: Effort normal. No respiratory distress. He has  no wheezes. He has no rales.  Abdominal: Soft. Bowel sounds are normal. He exhibits no distension. There is no tenderness.  Musculoskeletal: He exhibits no edema.  Neurological: He is alert and oriented to person, place, and time.  Skin: Skin is warm and dry.   Assessment & Plan:

## 2014-12-08 NOTE — Patient Instructions (Signed)
Mr. Martinique it was nice seeing you today.  -I removed fluid from your cyst today.   -If the area does not heal or becomes painful/ red/ more swollen, please call the clinic immediately.  -Return for a follow-up visit on 12/16/14.     Ganglion Cyst A ganglion cyst is a noncancerous, fluid-filled lump that occurs near joints or tendons. The ganglion cyst grows out of a joint or the lining of a tendon. It most often develops in the hand or wrist, but it can also develop in the shoulder, elbow, hip, knee, ankle, or foot. The round or oval ganglion cyst can be the size of a pea or larger than a grape. Increased activity may enlarge the size of the cyst because more fluid starts to build up.  CAUSES It is not known what causes a ganglion cyst to grow. However, it may be related to:  Inflammation or irritation around the joint.  An injury.  Repetitive movements or overuse.  Arthritis. RISK FACTORS Risk factors include:  Being a woman.  Being age 52-50. SIGNS AND SYMPTOMS Symptoms may include:   A lump. This most often appears on the hand or wrist, but it can occur in other areas of the body.  Tingling.  Pain.  Numbness.  Muscle weakness.  Weak grip.  Less movement in a joint. DIAGNOSIS Ganglion cysts are most often diagnosed based on a physical exam. Your health care provider will feel the lump and may shine a light alongside it. If it is a ganglion cyst, a light often shines through it. Your health care provider may order an X-ray, ultrasound, or MRI to rule out other conditions. TREATMENT Ganglion cysts usually go away on their own without treatment. If pain or other symptoms are involved, treatment may be needed. Treatment is also needed if the ganglion cyst limits your movement or if it gets infected. Treatment may include:  Wearing a brace or splint on your wrist or finger.  Taking anti-inflammatory medicine.  Draining fluid from the lump with a needle  (aspiration).  Injecting a steroid into the joint.  Surgery to remove the ganglion cyst. HOME CARE INSTRUCTIONS  Do not press on the ganglion cyst, poke it with a needle, or hit it.  Take medicines only as directed by your health care provider.  Wear your brace or splint as directed by your health care provider.  Watch your ganglion cyst for any changes.  Keep all follow-up visits as directed by your health care provider. This is important. SEEK MEDICAL CARE IF:  Your ganglion cyst becomes larger or more painful.  You have increased redness, red streaks, or swelling.  You have pus coming from the lump.  You have weakness or numbness in the affected area.  You have a fever or chills.   This information is not intended to replace advice given to you by your health care provider. Make sure you discuss any questions you have with your health care provider.   Document Released: 01/29/2000 Document Revised: 02/21/2014 Document Reviewed: 07/16/2013 Elsevier Interactive Patient Education [employer].

## 2014-12-08 NOTE — Telephone Encounter (Signed)
Patient called in reference to pain and swelling in wrist of recent cyst aspiration.  Per discharge instructions I advised pt that swelling and tenderness greater than what he had prior to aspiration was to be expected for 1st 24 hours or so.  Pt will rest hand and call us if pain/swelling worsens. FYI

## 2014-12-08 NOTE — Telephone Encounter (Signed)
I agree. Thank you Ms. Creasey.

## 2014-12-09 NOTE — Assessment & Plan Note (Addendum)
12/03/2014 Office Visit Written 12/05/2014 5:17 PM by Shela Leff, MD   CT from 03/2014: IMPRESSION: Minimal gallbladder sludge is present. 8 mm hyperechoic focus is noted in left hepatic lobe corresponding to abnormality seen on CT scan. In the absence of any history of malignancy, this most likely represents hemangioma. Follow-up ultrasound in 6 months is recommended to ensure stability. If the patient does have a history of malignancy, then metastatic disease cannot be excluded and further evaluation with MRI would be Recommended.  -Order abdominal CT at next visit on 12/08/2014           12/08/14 - Abdominal RUQ ultrasound ordered. F/u results.

## 2014-12-09 NOTE — Progress Notes (Signed)
I saw and evaluated the patient.  I personally confirmed the key portions of Dr. Elon Jester history and exam and reviewed pertinent patient test results.  The assessment, diagnosis, and plan were formulated together and I agree with the documentation in the resident's note with the exception that we will order a RUQ ultrasound, not a CT of the abdomen, at the follow-up visit.

## 2014-12-09 NOTE — Assessment & Plan Note (Signed)
Indication Painful ganglion cyst  Procedure Right wrist dorsum ganglion cyst aspiration  -Informed consent was obtained from the patient. -Patient prepped in the usual sterile fashion. -Cold numbing spray used to anesthetize the area. -1% lidocaine injected subcutaneously to further anesthetize the area. -18-gauge needle injected into the ganglion cyst. -Aspirated 0.25 mL of thick light yellow jelly-like substance. -Approximately 40 mg of Kenalog injected into the cyst to decrease the likelihood of recurrence. -Patient tolerated the procedure well without any immediate complications.

## 2014-12-15 NOTE — Progress Notes (Signed)
I saw and evaluated the patient. I personally confirmed the key portions of Dr. Elon Jester history and exam and reviewed pertinent patient test results. The assessment, diagnosis, and plan were formulated together and I agree with the documentation in the resident's note.  I was present in the room for the entire ganglion cyst aspiration.

## 2014-12-31 ENCOUNTER — Telehealth: Payer: Self-pay | Admitting: *Deleted

## 2014-12-31 NOTE — Telephone Encounter (Signed)
CALLED PATIENT WITH U/S APPOINTMENT INFORMATION/ 11-28-016 ARRIVE 7:45AM AT [place]. DO NOT EAR OR DRINK ANYTHING AFTER 12 MIDNIGHT. TO CALL [phone] IF UNABLE TO KEEP THIS APPOINTMENT OR NEED TO CHANGE IT FOR ANY REASON.

## 2015-01-12 ENCOUNTER — Ambulatory Visit (HOSPITAL_COMMUNITY): Payer: Self-pay

## 2015-03-06 ENCOUNTER — Other Ambulatory Visit: Payer: Self-pay | Admitting: Internal Medicine

## 2015-03-06 MED ORDER — PHENYTOIN SODIUM EXTENDED 100 MG PO CAPS: 300.0000 mg | ORAL_CAPSULE | Freq: Every day | ORAL | Status: DC

## 2015-03-06 MED ORDER — AMLODIPINE BESYLATE 10 MG PO TABS: 10.0000 mg | ORAL_TABLET | Freq: Every day | ORAL | Status: DC

## 2015-03-06 NOTE — Telephone Encounter (Signed)
Called to pharm 

## 2015-03-06 NOTE — Telephone Encounter (Signed)
Patient requesting a refill on his Dilantin and his Norvasc.

## 2015-03-09 ENCOUNTER — Encounter: Payer: Self-pay | Admitting: Internal Medicine

## 2015-03-09 ENCOUNTER — Ambulatory Visit (INDEPENDENT_AMBULATORY_CARE_PROVIDER_SITE_OTHER): Payer: Self-pay | Admitting: Internal Medicine

## 2015-03-09 VITALS — BP 162/93 | HR 117 | Temp 98.7°F | Wt 114.8 lb

## 2015-03-09 DIAGNOSIS — Z9114 Patient's other noncompliance with medication regimen: Secondary | ICD-10-CM

## 2015-03-09 DIAGNOSIS — F1029 Alcohol dependence with unspecified alcohol-induced disorder: Secondary | ICD-10-CM

## 2015-03-09 DIAGNOSIS — R569 Unspecified convulsions: Secondary | ICD-10-CM

## 2015-03-09 DIAGNOSIS — G40909 Epilepsy, unspecified, not intractable, without status epilepticus: Secondary | ICD-10-CM

## 2015-03-09 DIAGNOSIS — F1020 Alcohol dependence, uncomplicated: Secondary | ICD-10-CM

## 2015-03-09 DIAGNOSIS — I10 Essential (primary) hypertension: Secondary | ICD-10-CM

## 2015-03-09 NOTE — Assessment & Plan Note (Signed)
He continues to drink 1+ pint liquor per week.  No signs of cirrhosis at this time.  Last abdominal US with normal liver echogenicity.  Will continue to monitor for signs of cirrhosis, as he will need further workup (abdominal US, EGD, etc).

## 2015-03-09 NOTE — Assessment & Plan Note (Signed)
Patient has been managed on Phenytoin for a long period.  He is not followed by a neurologist.  We may consider switching to Keppra at future visits for decreased side effect profile.

## 2015-03-09 NOTE — Patient Instructions (Signed)
1. Continue your medications. 2. As always, decrease your alcohol and tobacco use. 3. Return to clinic 1 month for BP recheck.  Amlodipine tablets What is this medicine? AMLODIPINE (am LOE di peen) is a calcium-channel blocker. It affects the amount of calcium found in your heart and muscle cells. This relaxes your blood vessels, which can reduce the amount of work the heart has to do. This medicine is used to lower high blood pressure. It is also used to prevent chest pain. This medicine may be used for other purposes; ask your health care provider or pharmacist if you have questions. What should I tell my health care provider before I take this medicine? They need to know if you have any of these conditions: -heart problems like heart failure or aortic stenosis -liver disease -an unusual or allergic reaction to amlodipine, other medicines, foods, dyes, or preservatives -pregnant or trying to get pregnant -breast-feeding How should I use this medicine? Take this medicine by mouth with a glass of water. Follow the directions on the prescription label. Take your medicine at regular intervals. Do not take more medicine than directed. Talk to your pediatrician regarding the use of this medicine in children. Special care may be needed. This medicine has been used in children as young as 6. Persons over 84 years old may have a stronger reaction to this medicine and need smaller doses. Overdosage: If you think you have taken too much of this medicine contact a poison control center or emergency room at once. NOTE: This medicine is only for you. Do not share this medicine with others. What if I miss a dose? If you miss a dose, take it as soon as you can. If it is almost time for your next dose, take only that dose. Do not take double or extra doses. What may interact with this medicine? -herbal or dietary supplements -local or general anesthetics -medicines for high blood pressure -medicines for  prostate problems -rifampin This list may not describe all possible interactions. Give your health care provider a list of all the medicines, herbs, non-prescription drugs, or dietary supplements you use. Also tell them if you smoke, drink alcohol, or use illegal drugs. Some items may interact with your medicine. What should I watch for while using this medicine? Visit your doctor or health care professional for regular check ups. Check your blood pressure and pulse rate regularly. Ask your health care professional what your blood pressure and pulse rate should be, and when you should contact him or her. This medicine may make you feel confused, dizzy or lightheaded. Do not drive, use machinery, or do anything that needs mental alertness until you know how this medicine affects you. To reduce the risk of dizzy or fainting spells, do not sit or stand up quickly, especially if you are an older patient. Avoid alcoholic drinks; they can make you more dizzy. Do not suddenly stop taking amlodipine. Ask your doctor or health care professional how you can gradually reduce the dose. What side effects may I notice from receiving this medicine? Side effects that you should report to your doctor or health care professional as soon as possible: -allergic reactions like skin rash, itching or hives, swelling of the face, lips, or tongue -breathing problems -changes in vision or hearing -chest pain -fast, irregular heartbeat -swelling of legs or ankles Side effects that usually do not require medical attention (report to your doctor or health care professional if they continue or are bothersome): -dry mouth -  facial flushing -nausea, vomiting -stomach gas, pain -tired, weak -trouble sleeping This list may not describe all possible side effects. Call your doctor for medical advice about side effects. You may report side effects to FDA at 1-800-FDA-1088. Where should I keep my medicine? Keep out of the reach of  children. Store at room temperature between 59 and 86 degrees F (15 and 30 degrees C). Protect from light. Keep container tightly closed. Throw away any unused medicine after the expiration date. NOTE: This sheet is a summary. It may not cover all possible information. If you have questions about this medicine, talk to your doctor, pharmacist, or health care provider.    2016, Elsevier/Gold Standard. (2011-12-30 11:40:58)

## 2015-03-09 NOTE — Assessment & Plan Note (Signed)
BP Readings from Last 3 Encounters:  03/09/15 162/93  12/08/14 126/79  12/03/14 122/76    Lab Results  Component Value Date   NA 139 08/21/2014   K 4.1 08/21/2014   CREATININE 0.81 08/21/2014    Assessment: Blood pressure control:  poor Progress toward BP goal:   deteriorating Comments: Worsened in the setting of not having taken his medication for the last month.  He has also been tachycardic in all of his recent clinic visits for unclear cause.  May consider starting beta blocker if further BP control is needed.  Plan: Medications:  continue current medications, Amlodipine 10 mg daily Educational resources provided:   Self management tools provided:   Other plans: recheck one month

## 2015-03-09 NOTE — Progress Notes (Signed)
Patient ID: Joseph Underwood, male   DOB: 30-Dec-1965, 50 y.o.   MRN: [id number]    Subjective:   Patient ID: Joseph Underwood male   DOB: July 16, 1965 50 y.o.   MRN: [id number]  HPI: Joseph Underwood is a 50 y.o. male with PMH as below, here for medication refill and f/u HTN.  Please see Problem-Based charting for the status of the patient's chronic medical issues.  He has been off of his Amlodipine 10 mg for a month.  He denies CP, SOB, or HA.  However, he experiences intermittent dizziness when he bends down.  He has also had intermittent blurry vision for the last month.  He denies palpitations.  He has a h/o seizure disorder.  He is not followed by a neurologist.  He has been off of his Phenytoin for a month.  He denies repeat seizure, LOC, or falls.  His last seizure was 7 months ago.  He is unable to describe a typical episode.   He continues to drink 1+ pints of liquor per week.  He has cut back to 1/3 ppd of tobacco.   Past Medical History  Diagnosis Date  . Alcohol abuse   . Hiccups   . HTN (hypertension)   . Alcohol related seizure [facility])    Current Outpatient Prescriptions  Medication Sig Dispense Refill  . amLODipine (NORVASC) 10 MG tablet Take 1 tablet (10 mg total) by mouth daily. 90 tablet 3  . chlordiazePOXIDE (LIBRIUM) 25 MG capsule 50mg  PO TID x 1D, then 25 mg PO BID X 1D, then 25 mg PO QD X 1D 10 capsule 0  . folic acid (FOLVITE) 1 MG tablet Take 1 tablet (1 mg total) by mouth daily. 30 tablet 2  . gabapentin (NEURONTIN) 300 MG capsule Take 1 capsule (300 mg total) by mouth 3 (three) times daily. 90 capsule 2  . MAG64 535 (64 MG) MG TBCR TAKE 3 TABLETS (192 MG TOTAL) BY MOUTH 2 (TWO) TIMES DAILY. 180 tablet 0  . metroNIDAZOLE (FLAGYL) 500 MG tablet Take 1 tablet (500 mg total) by mouth every 8 (eight) hours. Take next dose at [facility] today (07/03/14) and then every 8 hours for next 13 days. (Patient not taking: Reported on 08/21/2014) 39 tablet 0  . Multiple Vitamin (MULTIVITAMIN  WITH MINERALS) TABS tablet Take 1 tablet by mouth daily. 30 tablet 2  . phenytoin (DILANTIN) 100 MG ER capsule Take 3 capsules (300 mg total) by mouth at bedtime. 90 capsule 2  . Tetrahydrozoline HCl (VISINE OP) Place 1 drop into both eyes daily as needed (dry eyes).    . thiamine (VITAMIN B-1) 100 MG tablet Take 1 tablet (100 mg total) by mouth daily. 30 tablet 6   No current facility-administered medications for this visit.   Family History  Problem Relation Age of Onset  . Hypertension Mother    Social History   Social History  . Marital Status: Single    Spouse Name: N/A  . Number of Children: N/A  . Years of Education: N/A   Social History Main Topics  . Smoking status: Current Every Day Smoker -- 0.50 packs/day for 37 years    Types: Cigarettes  . Smokeless tobacco: Never Used     Comment: 1/2PKD  . Alcohol Use: 52.8 oz/week    14 Cans of beer, 74 Shots of liquor per week     Comment: 12/16/2013 "~ 1 pint of liquor per day"  . Drug Use: No  . Sexual Activity:  No   Other Topics Concern  . Not on file   Social History Narrative   Review of Systems: Pertinent items noted in HPI.  Objective:  Physical Exam: Filed Vitals:   03/09/15 1432  BP: 162/93  Pulse: 117  Temp: 98.7 F (37.1 C)  TempSrc: Oral  Weight: 114 lb 12.8 oz (52.073 kg)  SpO2: 100%   Physical Exam  Constitutional: He is oriented to person, place, and time and well-developed, well-nourished, and in no distress. No distress.  HENT:  Head: Normocephalic and atraumatic.  Eyes: EOM are normal. No scleral icterus.  Neck: No JVD present. No tracheal deviation present.  Cardiovascular: Regular rhythm and normal heart sounds.   Tachycardic.  Pulmonary/Chest: Effort normal and breath sounds normal. No stridor. No respiratory distress. He has no wheezes.  Neurological: He is alert and oriented to person, place, and time.  Skin: Skin is warm and dry. He is not diaphoretic.     Assessment & Plan:    Patient and case were discussed with Dr. Evette Doffing.  Please refer to Problem Based charting for further documentation.

## 2015-03-11 NOTE — Progress Notes (Signed)
Internal Medicine Clinic Attending  Case discussed with Dr. Taylor at the time of the visit.  We reviewed the resident's history and exam and pertinent patient test results.  I agree with the assessment, diagnosis, and plan of care documented in the resident's note. 

## 2015-04-23 ENCOUNTER — Encounter: Payer: Self-pay | Admitting: Internal Medicine

## 2015-05-08 ENCOUNTER — Ambulatory Visit: Payer: Self-pay

## 2015-07-27 ENCOUNTER — Other Ambulatory Visit: Payer: Self-pay | Admitting: Internal Medicine

## 2015-09-01 ENCOUNTER — Ambulatory Visit (INDEPENDENT_AMBULATORY_CARE_PROVIDER_SITE_OTHER): Payer: Self-pay | Admitting: Internal Medicine

## 2015-09-01 ENCOUNTER — Encounter: Payer: Self-pay | Admitting: Internal Medicine

## 2015-09-01 VITALS — BP 157/94 | HR 94 | Temp 98.4°F | Ht 65.0 in | Wt 123.3 lb

## 2015-09-01 DIAGNOSIS — F1020 Alcohol dependence, uncomplicated: Secondary | ICD-10-CM

## 2015-09-01 DIAGNOSIS — R569 Unspecified convulsions: Secondary | ICD-10-CM

## 2015-09-01 DIAGNOSIS — K7689 Other specified diseases of liver: Secondary | ICD-10-CM

## 2015-09-01 DIAGNOSIS — F10939 Alcohol use, unspecified with withdrawal, unspecified: Secondary | ICD-10-CM

## 2015-09-01 DIAGNOSIS — I10 Essential (primary) hypertension: Secondary | ICD-10-CM

## 2015-09-01 DIAGNOSIS — F10239 Alcohol dependence with withdrawal, unspecified: Secondary | ICD-10-CM

## 2015-09-01 DIAGNOSIS — Z862 Personal history of diseases of the blood and blood-forming organs and certain disorders involving the immune mechanism: Secondary | ICD-10-CM

## 2015-09-01 DIAGNOSIS — F1029 Alcohol dependence with unspecified alcohol-induced disorder: Secondary | ICD-10-CM

## 2015-09-01 MED ORDER — VERAPAMIL HCL 80 MG PO TABS: 80.0000 mg | ORAL_TABLET | Freq: Three times a day (TID) | ORAL | Status: DC

## 2015-09-01 NOTE — Patient Instructions (Signed)
Return to the clinic for a follow-up visit in 1 month.  Take Verapamil as instructed.

## 2015-09-02 NOTE — Assessment & Plan Note (Signed)
BP Readings from Last 3 Encounters:  09/01/15 157/94  03/09/15 162/93  12/08/14 126/79    Lab Results  Component Value Date   NA 139 08/21/2014   K 4.1 08/21/2014   CREATININE 0.81 08/21/2014    Assessment: Blood pressure control:  uncontrolled  Comments: Patient states he stopped taking Amlodipine 4 months ago because of the cost of the medication. Reports having intermittent headaches and blurry vision. Denies any CP or SOB. Neuro exam non-focal.   Plan: Medications: D/c Amlodipine. Start on verapamil 80 mg TID which would help with both his BP and tachycardia. Verapamil is on the [facility] 4 dollar list.  Educational resources provided:  Educated about healthy eating and exercise.  Other plans: -RTC in 1 month

## 2015-09-02 NOTE — Progress Notes (Signed)
Patient ID: Joseph Underwood, male   DOB: 1965-06-22, 50 y.o.   MRN: [id number]   CC: "i am here for a regular visit"  HPI:  Joseph Underwood is a 50 y.o. M with a PMHx of HTN, seizures, alcohol abuse presenting to the clinic to discuss his HTN, alcohol abuse, and history of seizures. Please see problem based charting for details.   Past Medical History  Diagnosis Date  . Alcohol abuse   . Hiccups   . HTN (hypertension)   . Alcohol related seizure ([place])     Review of Systems:  Pertinent positives mentioned in HPI. All other ROS negative.   Physical Exam:  Filed Vitals:   09/01/15 1532  BP: 157/94  Pulse: 94  Temp: 98.4 F (36.9 C)  TempSrc: Oral  Height: 5\' 5"  (1.651 m)  Weight: 123 lb 4.8 oz (55.929 kg)  SpO2: 100%   Physical Exam  Constitutional: He is oriented to person, place, and time. He appears well-developed and well-nourished. No distress.  HENT:  Head: Normocephalic and atraumatic.  Mouth/Throat: Oropharynx is clear and moist.  Eyes: EOM are normal. Pupils are equal, round, and reactive to light.  Neck: Neck supple. No tracheal deviation present.  Cardiovascular: Intact distal pulses.  Exam reveals no gallop and no friction rub.   No murmur heard. Tachycardic   Pulmonary/Chest: Effort normal and breath sounds normal. No respiratory distress. He has no wheezes. He has no rales.  Abdominal: Soft. Bowel sounds are normal. He exhibits no distension. There is no tenderness. There is no guarding.  Musculoskeletal: Normal range of motion. He exhibits no edema.  Neurological: He is alert and oriented to person, place, and time. No cranial nerve deficit. Coordination normal.  Strength 5/5 and sensation grossly intact in b/l UEs and LEs.   Skin: Skin is warm and dry.    Assessment & Plan:   See encounters tab for problem based medical decision making.   Patient discussed with Dr. Lynnae January

## 2015-09-02 NOTE — Assessment & Plan Note (Addendum)
HPI Reports drinking 3-4 shots of liquor every 4 days.   A Continues to have alcohol dependence. Last abdominal US showing 8 mm hyperechoic focus in the left hepatic lobe.   P -Repeat RUQ abdominal ultrasound -CMP -Anemia panel (history of anemia)   Addendum 09/12/15: Labs showing mildly elevated AST (45) and ALT (50). Alk phos normal.  Hgb (13.1) is normal. Iron saturation (50%) normal. Ferritin slightly elevated at 448. B12 normal. Folate level is low (2.0) -Spoke to patient over the phone and advised him to take his folic acid supplement daily.

## 2015-09-02 NOTE — Assessment & Plan Note (Addendum)
HPI Patient reports have an episode of seizure activity about a month ago. States his whole body was shaking and he bit his tongue a little. Reports being confused afterwards. States he stopped taking phenytoin a few months ago and then restarted the medication after this episode. He has a history of alcohol dependence and reports drinking 3-4 shots of liquor every 4 days.   A Possible tonic clonic seizures. It is not clear whether his seizure is secondary to medication non-compliance vs. alcohol withdrawal.   P -check phenytoin level -continue Phenytoin  Addendum 09/11/15: Phenytoin level checked at this visit is normal.

## 2015-09-03 NOTE — Progress Notes (Signed)
Internal Medicine Clinic Attending  Case discussed with Dr. Rathoreat the time of the visit. We reviewed the resident's history and exam and pertinent patient test results. I agree with the assessment, diagnosis, and plan of care documented in the resident's note.  

## 2015-09-07 ENCOUNTER — Ambulatory Visit (HOSPITAL_COMMUNITY)
Admission: RE | Admit: 2015-09-07 | Discharge: 2015-09-07 | Disposition: A | Discharge status: Home / Self Care | Payer: Self-pay | Source: Ambulatory Visit | Attending: Internal Medicine | Admitting: Internal Medicine

## 2015-09-07 DIAGNOSIS — R932 Abnormal findings on diagnostic imaging of liver and biliary tract: Secondary | ICD-10-CM | POA: Insufficient documentation

## 2015-09-07 DIAGNOSIS — F1029 Alcohol dependence with unspecified alcohol-induced disorder: Secondary | ICD-10-CM | POA: Insufficient documentation

## 2015-09-07 NOTE — Addendum Note (Signed)
Addended by: Shela Leff on: 09/07/2015 03:18 PM   Modules accepted: Orders

## 2015-09-08 ENCOUNTER — Encounter: Payer: Self-pay | Admitting: Internal Medicine

## 2015-09-08 ENCOUNTER — Other Ambulatory Visit (INDEPENDENT_AMBULATORY_CARE_PROVIDER_SITE_OTHER): Payer: Self-pay

## 2015-09-08 DIAGNOSIS — F1029 Alcohol dependence with unspecified alcohol-induced disorder: Secondary | ICD-10-CM

## 2015-09-08 NOTE — Addendum Note (Signed)
Addended by: Truddie Crumble on: 09/08/2015 11:08 AM   Modules accepted: Orders

## 2015-09-09 LAB — ANEMIA PROFILE B
Basophils Absolute: 0.1 10*3/uL (ref 0.0–0.2)
Basos: 2 %
EOS (ABSOLUTE): 0.2 10*3/uL (ref 0.0–0.4)
EOS: 3 %
FOLATE: 2 ng/mL — AB (ref >3.0)
Ferritin: 448 ng/mL — ABNORMAL HIGH (ref 30–400)
HEMATOCRIT: 38.1 % (ref 37.5–51.0)
Hemoglobin: 13.1 g/dL (ref 12.6–17.7)
IRON SATURATION: 50 % (ref 15–55)
Immature Grans (Abs): 0.1 10*3/uL (ref 0.0–0.1)
Immature Granulocytes: 1 %
Iron: 177 ug/dL — ABNORMAL HIGH (ref 38–169)
LYMPHS: 50 %
Lymphocytes Absolute: 3.2 10*3/uL — ABNORMAL HIGH (ref 0.7–3.1)
MCH: 32 pg (ref 26.6–33.0)
MCHC: 34.4 g/dL (ref 31.5–35.7)
MCV: 93 fL (ref 79–97)
MONOCYTES: 11 %
Monocytes Absolute: 0.7 10*3/uL (ref 0.1–0.9)
Neutrophils Absolute: 2 10*3/uL (ref 1.4–7.0)
Neutrophils: 33 %
Platelets: 444 10*3/uL — ABNORMAL HIGH (ref 150–379)
RBC: 4.09 x10E6/uL — ABNORMAL LOW (ref 4.14–5.80)
RDW: 13.8 % (ref 12.3–15.4)
RETIC CT PCT: 1 % (ref 0.6–2.6)
TIBC: 353 ug/dL (ref 250–450)
UIBC: 176 ug/dL (ref 111–343)
Vitamin B-12: 350 pg/mL (ref 211–946)
WBC: 6.1 10*3/uL (ref 3.4–10.8)

## 2015-09-09 LAB — CMP14 + ANION GAP
ALK PHOS: 87 IU/L (ref 39–117)
ALT: 50 IU/L — AB (ref 0–44)
AST: 45 IU/L — AB (ref 0–40)
Albumin/Globulin Ratio: 1.7 (ref 1.2–2.2)
Albumin: 4.6 g/dL (ref 3.5–5.5)
Anion Gap: 19 mmol/L — ABNORMAL HIGH (ref 10.0–18.0)
BUN/Creatinine Ratio: 16 (ref 9–20)
BUN: 12 mg/dL (ref 6–24)
CHLORIDE: 99 mmol/L (ref 96–106)
CO2: 26 mmol/L (ref 18–29)
Calcium: 9.1 mg/dL (ref 8.7–10.2)
Creatinine, Ser: 0.77 mg/dL (ref 0.76–1.27)
GFR calc non Af Amer: 107 mL/min/{1.73_m2} (ref >59)
GFR, EST AFRICAN AMERICAN: 123 mL/min/{1.73_m2} (ref >59)
Globulin, Total: 2.7 g/dL (ref 1.5–4.5)
Glucose: 93 mg/dL (ref 65–99)
POTASSIUM: 4.9 mmol/L (ref 3.5–5.2)
Sodium: 144 mmol/L (ref 134–144)
TOTAL PROTEIN: 7.3 g/dL (ref 6.0–8.5)

## 2015-09-09 LAB — PHENYTOIN LEVEL, TOTAL: PHENYTOIN (DILANTIN), SERUM: 15.3 ug/mL (ref 10.0–20.0)

## 2015-09-11 MED ORDER — FOLIC ACID 1 MG PO TABS: 1.0000 mg | ORAL_TABLET | Freq: Every day | ORAL | 3 refills | Status: DC

## 2015-09-11 NOTE — Addendum Note (Signed)
Addended by: Shela Leff on: 09/11/2015 03:57 PM   Modules accepted: Orders

## 2015-09-12 ENCOUNTER — Inpatient Hospital Stay (HOSPITAL_COMMUNITY): Admission: RE | Admit: 2015-09-12 | Payer: Self-pay | Source: Ambulatory Visit

## 2015-09-12 ENCOUNTER — Ambulatory Visit (HOSPITAL_COMMUNITY): Payer: Self-pay

## 2015-09-14 ENCOUNTER — Ambulatory Visit (HOSPITAL_COMMUNITY)
Admission: RE | Admit: 2015-09-14 | Discharge: 2015-09-14 | Disposition: A | Discharge status: Home / Self Care | Payer: Self-pay | Source: Ambulatory Visit | Attending: Internal Medicine | Admitting: Internal Medicine

## 2015-09-14 ENCOUNTER — Other Ambulatory Visit: Payer: Self-pay

## 2015-09-14 DIAGNOSIS — K7460 Unspecified cirrhosis of liver: Secondary | ICD-10-CM | POA: Insufficient documentation

## 2015-09-14 DIAGNOSIS — F1029 Alcohol dependence with unspecified alcohol-induced disorder: Secondary | ICD-10-CM | POA: Insufficient documentation

## 2015-09-14 MED ORDER — VERAPAMIL HCL 80 MG PO TABS: 80.0000 mg | ORAL_TABLET | Freq: Three times a day (TID) | ORAL | 1 refills | Status: DC

## 2015-09-14 MED ORDER — GADOBENATE DIMEGLUMINE 529 MG/ML IV SOLN
10.0000 mL | Freq: Once | INTRAVENOUS | Status: AC | PRN
Start: 2015-09-14 — End: 2015-09-14
  Administered 2015-09-14: 10 mL via INTRAVENOUS

## 2015-09-14 NOTE — Telephone Encounter (Signed)
[facility]

## 2015-09-15 NOTE — Telephone Encounter (Signed)
[facility] pharmacy does not carry verapamil. The only thing that they have that is comparable is diltiazem 120mg . Please advise.

## 2015-09-21 ENCOUNTER — Telehealth: Payer: Self-pay | Admitting: *Deleted

## 2015-09-21 MED ORDER — DILTIAZEM HCL ER COATED BEADS 120 MG PO CP24: 120.0000 mg | ORAL_CAPSULE | Freq: Every day | ORAL | 1 refills | Status: DC

## 2015-09-21 NOTE — Telephone Encounter (Signed)
[facility] clarifying that patient was supposed to stop Verapamil and start Cardizem. Had [facility] RX as [facility]. Attempted to call [facility] and transfer RX but no answer. Left VM to return call.

## 2015-09-23 NOTE — Telephone Encounter (Signed)
Christian to transfer [employer] [facility] from [facility] to [facility].

## 2015-09-29 ENCOUNTER — Other Ambulatory Visit: Payer: Self-pay

## 2015-10-13 ENCOUNTER — Encounter: Payer: Self-pay | Admitting: Internal Medicine

## 2015-10-13 ENCOUNTER — Ambulatory Visit (INDEPENDENT_AMBULATORY_CARE_PROVIDER_SITE_OTHER): Payer: Self-pay | Admitting: Internal Medicine

## 2015-10-13 VITALS — BP 140/90 | HR 119 | Temp 98.1°F | Ht 65.0 in | Wt 122.8 lb

## 2015-10-13 DIAGNOSIS — F10239 Alcohol dependence with withdrawal, unspecified: Secondary | ICD-10-CM

## 2015-10-13 DIAGNOSIS — Z79899 Other long term (current) drug therapy: Secondary | ICD-10-CM

## 2015-10-13 DIAGNOSIS — E781 Pure hyperglyceridemia: Secondary | ICD-10-CM

## 2015-10-13 DIAGNOSIS — I10 Essential (primary) hypertension: Secondary | ICD-10-CM

## 2015-10-13 DIAGNOSIS — R569 Unspecified convulsions: Secondary | ICD-10-CM

## 2015-10-13 DIAGNOSIS — F17210 Nicotine dependence, cigarettes, uncomplicated: Secondary | ICD-10-CM

## 2015-10-13 DIAGNOSIS — F17200 Nicotine dependence, unspecified, uncomplicated: Secondary | ICD-10-CM

## 2015-10-13 DIAGNOSIS — F10939 Alcohol use, unspecified with withdrawal, unspecified: Secondary | ICD-10-CM

## 2015-10-13 DIAGNOSIS — F1029 Alcohol dependence with unspecified alcohol-induced disorder: Secondary | ICD-10-CM

## 2015-10-13 DIAGNOSIS — K769 Liver disease, unspecified: Secondary | ICD-10-CM

## 2015-10-13 DIAGNOSIS — G4089 Other seizures: Secondary | ICD-10-CM

## 2015-10-13 MED ORDER — VERAPAMIL HCL 80 MG PO TABS: 80.0000 mg | ORAL_TABLET | Freq: Three times a day (TID) | ORAL | 3 refills | Status: DC

## 2015-10-13 NOTE — Assessment & Plan Note (Addendum)
BP Readings from Last 3 Encounters:  10/13/15 140/90  09/01/15 (!) 157/94  03/09/15 (!) 162/93    Lab Results  Component Value Date   NA 144 09/08/2015   K 4.9 09/08/2015   CREATININE 0.77 09/08/2015    Assessment: Comments: Initial blood pressure at this visit 143/100, repeat was 140/90. However, he continues to be tachycardic; initial pulse 121, repeat 119. Patient is chronically tachycardic as per review of vital signs from previous visits, likely in the setting of alcohol withdrawal. Echo from June 2013 showing moderate LVH and normal left ventricular ejection fraction (55-60%). He was previously prescribed verapamil but states he never picked up the prescription from the pharmacy.   Plan: Medications: Continue verapamil 80 mg 3 times a day. This medication will be cost effective for the patient has it is on the [facility] $4 list. Educational resources provided:   Educated patient about healthy eating and exercise.  Other plans:  -Patient has been advised to return to the clinic in 2 weeks for recheck of vital signs.

## 2015-10-13 NOTE — Progress Notes (Signed)
   CC: Patient is here to discuss his chronic medical conditions.  HPI:  [name] is a 50 y.o. male with a past medical history of conditions listed below presenting to the clinic to discuss his chronic medical conditions including alcohol dependence, alcohol withdrawal seizures, tobacco use, hypertension, and hypertriglyceridemia. Please see problem based charting for the status of the patient's chronic medical conditions.  Past Medical History:  Diagnosis Date  . Alcohol abuse   . Alcohol related seizure ([place])   . Hiccups   . HTN (hypertension)     Review of Systems:  Pertinent positives mentioned in history of present illness. Remainder of all review of systems negative.  Physical Exam:  Vitals:   10/13/15 1610  BP: (!) 143/100  Pulse: (!) 121  Temp: 98.1 F (36.7 C)  TempSrc: Oral  SpO2: 99%  Weight: 122 lb 12.8 oz (55.7 kg)  Height: 5\' 5"  (1.651 m)   Physical Exam  Constitutional: He is oriented to person, place, and time. He appears well-developed and well-nourished. No distress.  HENT:  Head: Normocephalic and atraumatic.  Mouth/Throat: Oropharynx is clear and moist.  Eyes: EOM are normal. Pupils are equal, round, and reactive to light.  Neck: Neck supple. No tracheal deviation present.  Cardiovascular: Regular rhythm and intact distal pulses.  Exam reveals no gallop and no friction rub.   No murmur heard. Tachycardic  Pulmonary/Chest: Effort normal and breath sounds normal. No respiratory distress. He has no wheezes. He has no rales.  Abdominal: Soft. Bowel sounds are normal. He exhibits no distension. There is no tenderness. There is no guarding.  Musculoskeletal: Normal range of motion. He exhibits no edema.  Neurological: He is alert and oriented to person, place, and time.  Skin: Skin is warm and dry.    Assessment & Plan:   See Encounters Tab for problem based charting.  Patient discussed with Dr. Eppie Gibson

## 2015-10-13 NOTE — Assessment & Plan Note (Signed)
Assessment Patient states he continues to drink beer on a regular basis and is not drinking liquor anymore. Score 3 out of 4 on [place] questionnaire. He has declined any help from the clinic/social work at this time stating he has been to 3 classes in the past and they did not help because he lacks personal motivation. States his father used to drink alcohol and smoke cigarettes and these habits have been passed on to him.  Plan -Discuss again at next visit.

## 2015-10-13 NOTE — Assessment & Plan Note (Addendum)
Assessment Patient is not currently on any medication.  Plan -Lipid panel ordered  Addendum (10/15/15): Lipid panel showing chol 259, trig 1444, HDL 38. LDL could not be calculated due to high triglyceride level. Spoke to the patient over the phone to discuss the results. -Start Atorvastatin 20 mg daily -Start Fenofibrate 145 mg daily  -Repeat lipid panel in 3 months. May increase dose of Atorvastatin if patient does not experience any symptoms of myopathy.

## 2015-10-13 NOTE — Assessment & Plan Note (Addendum)
Assessment Patient denies having any abdominal pain, nausea, vomiting, jaundice, or diarrhea/loose stools. Report from MRI of abdomen done 09/14/2015 mentions no focal lesions in the liver, no ascites or evidence portal hypertension. 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  Plan -Emphasized importance of abstaining from alcohol use. Please read note on alcohol dependence.

## 2015-10-13 NOTE — Assessment & Plan Note (Signed)
Assessment Patient states he has cut down his smoking to 4 cigarettes per day; used to smoke up to one pack per day previously. I spent a dedicated 3-4 minutes discussing smoking cessation at this visit. He seems to be in the pre-contemplative stage at this time and is not ready to quit.  Plan -Discuss again at next visit

## 2015-10-13 NOTE — Patient Instructions (Signed)
Joseph Underwood, it was nice to see you today.  It is very important for you to start taking Verapamil as instructed.   Please return for a follow-up visit in 2 weeks.

## 2015-10-13 NOTE — Assessment & Plan Note (Signed)
Assessment Patient denies having any seizures since his last visit. Currently taking phenytoin 300 mg daily. Phenytoin level checked during previous visit was normal. Seizures in the past were likely in the setting of alcohol withdrawal.  Plan -Continue to monitor

## 2015-10-14 LAB — LIPID PANEL
CHOL/HDL RATIO: 6.8 ratio — AB (ref 0.0–5.0)
Cholesterol, Total: 259 mg/dL — ABNORMAL HIGH (ref 100–199)
HDL: 38 mg/dL — ABNORMAL LOW (ref >39)
Triglycerides: 1444 mg/dL (ref 0–149)

## 2015-10-15 MED ORDER — FENOFIBRATE 145 MG PO TABS: 145.0000 mg | ORAL_TABLET | Freq: Every day | ORAL | 1 refills | Status: DC

## 2015-10-15 MED ORDER — ATORVASTATIN CALCIUM 40 MG PO TABS
40.0000 mg | ORAL_TABLET | Freq: Every day | ORAL | 3 refills | Status: DC
Start: 2015-10-15 — End: 2015-10-15

## 2015-10-15 MED ORDER — ATORVASTATIN CALCIUM 20 MG PO TABS: 20.0000 mg | ORAL_TABLET | Freq: Every day | ORAL | 1 refills | Status: DC

## 2015-10-15 MED ORDER — FENOFIBRATE 145 MG PO TABS: 145.0000 mg | ORAL_TABLET | Freq: Every day | ORAL | 3 refills | Status: DC

## 2015-10-15 NOTE — Progress Notes (Signed)
Case discussed with Dr. Rathore at the time of the visit.  We reviewed the resident's history and exam and pertinent patient test results.  I agree with the assessment, diagnosis, and plan of care documented in the resident's note. 

## 2015-10-15 NOTE — Addendum Note (Signed)
Addended by: Shela Leff on: 10/15/2015 04:55 PM   Modules accepted: Orders

## 2015-10-16 ENCOUNTER — Telehealth: Payer: Self-pay

## 2015-10-16 NOTE — Telephone Encounter (Signed)
Call pt - who had questions about his medications .States he was started on a BP pill which is Verapamil,told him correct. He said he needed to pick up a med from our pharmacy - told him, "Yes"; 2 cholesterol meds and gave him direction to the pharmacy.. I asked him if he needed to talk to our pharmacist;states "No". I wasn't quite sure if I answered his question??

## 2015-10-16 NOTE — Telephone Encounter (Signed)
Questions about verapmil . Please call pt back.

## 2015-10-21 NOTE — Telephone Encounter (Signed)
Thanks Glenda! 

## 2015-10-26 ENCOUNTER — Telehealth: Payer: Self-pay | Admitting: *Deleted

## 2015-10-26 MED ORDER — FENOFIBRATE 145 MG PO TABS: 145.0000 mg | ORAL_TABLET | Freq: Every day | ORAL | 1 refills | Status: DC

## 2015-10-26 MED ORDER — ATORVASTATIN CALCIUM 20 MG PO TABS: 20.0000 mg | ORAL_TABLET | Freq: Every day | ORAL | 1 refills | Status: DC

## 2015-10-26 NOTE — Addendum Note (Signed)
Addended by: Forde Dandy on: 10/26/2015 04:34 PM   Modules accepted: Orders

## 2015-10-26 NOTE — Telephone Encounter (Signed)
Pt here at the clinic -went to pick Tricor and Atorvastatin rxs at [facility]; they did not have anything ready. I called them - they weren't sure if the meds needed to be filled under the special funds since it was not written on the rxs. Pt states no insurance. Need to be re-ordered. Has appt tomorrow w/Dr Marlowe Sax. Thanks

## 2015-10-27 ENCOUNTER — Encounter: Payer: Self-pay | Admitting: Internal Medicine

## 2015-10-27 NOTE — Telephone Encounter (Signed)
Completed by Mannie Stabile.

## 2015-10-28 NOTE — Telephone Encounter (Signed)
Prescriptions re-sent. Thank you!

## 2015-10-29 ENCOUNTER — Ambulatory Visit: Payer: Self-pay | Admitting: Pharmacist

## 2015-11-05 ENCOUNTER — Ambulatory Visit (INDEPENDENT_AMBULATORY_CARE_PROVIDER_SITE_OTHER): Payer: Self-pay | Admitting: Internal Medicine

## 2015-11-05 VITALS — BP 151/94 | HR 104 | Temp 98.8°F | Ht 65.0 in | Wt 120.7 lb

## 2015-11-05 DIAGNOSIS — R000 Tachycardia, unspecified: Secondary | ICD-10-CM

## 2015-11-05 DIAGNOSIS — F17210 Nicotine dependence, cigarettes, uncomplicated: Secondary | ICD-10-CM

## 2015-11-05 DIAGNOSIS — F10288 Alcohol dependence with other alcohol-induced disorder: Secondary | ICD-10-CM

## 2015-11-05 DIAGNOSIS — F1029 Alcohol dependence with unspecified alcohol-induced disorder: Secondary | ICD-10-CM

## 2015-11-05 DIAGNOSIS — E781 Pure hyperglyceridemia: Secondary | ICD-10-CM

## 2015-11-05 DIAGNOSIS — Z79899 Other long term (current) drug therapy: Secondary | ICD-10-CM

## 2015-11-05 NOTE — Assessment & Plan Note (Signed)
Did not receive or pick up Atorvastatin and Fenofibrate from pharmacy, called and confirmed that they are available and advised he pick these up at his earliest convenience.

## 2015-11-05 NOTE — Patient Instructions (Addendum)
Please continue to take your medications as prescribed.  Please continue to cut down the amount of alcohol you are drinking, as well as the amount of cigarettes smoke.  Consider calling the number provided on the [facility] pamphlet we have provided you today [phone]), or consider revisiting alcoholics anonymous as soon as possible.   Thank you for visiting [place] today!

## 2015-11-05 NOTE — Progress Notes (Signed)
   CC: Medication refill  HPI:  Mr.Gaius E Martinique is a 50 y.o. male with PMHx detailed below presenting with no acute complaints. For some reason he could not get his Atorvastatin and Fenofibrate that were prescribed last visit from the pharmacy. Called [facility] outpatient pharmacy and confirmed his medications are available for pickup by the patient. Mr. Martinique continues to be tachycardic and this this is likely due to his continued alcohol abuse (endorses drinking a fifth of hard liquor every 3 days).   See problem based assessment and plan below for additional details.  Past Medical History:  Diagnosis Date  . Alcohol abuse   . Alcohol related seizure ([place])   . Hiccups   . HTN (hypertension)     Review of Systems: Review of Systems  Constitutional: Negative for chills and fever.  Respiratory: Negative for shortness of breath.   Cardiovascular: Negative for chest pain.  Gastrointestinal: Negative for abdominal pain.  Neurological: Positive for tremors. Negative for tingling, speech change and seizures.    Physical Exam: Vitals:   11/05/15 1545  BP: (!) 151/94  Pulse: (!) 104  Temp: 98.8 F (37.1 C)  TempSrc: Oral  SpO2: 100%  Weight: 120 lb 11.2 oz (54.7 kg)  Height: 5\' 5"  (1.651 m)   Body mass index is 20.09 kg/m. GENERAL- Quiet gentleman sitting comfortably in exam room chair, alert, in no distress CARDIAC- Tachycardic rate and regular rhythm, no murmurs, rubs or gallops. RESP- Clear to ascultation bilaterally, no wheezing or crackles, normal work of breathing ABDOMEN- Normoactive bowel sounds, soft, nontender, nondistended SKIN- Warm, dry, intact, without visible rash PSYCH- Appropriate affect, clear speech, thoughts linear and goal-directed  Assessment & Plan:   See encounters tab for problem based medical decision making.  Patient seen with Dr. Angelia Mould

## 2015-11-05 NOTE — Assessment & Plan Note (Signed)
Endorses continues alcohol abuse, drinking fifth of liquor every 3 days. Remains tachycardic and "gets the shakes" about two days after last drink. History of withdrawal seizures in the past. Has tried AA but did not like it in the past.   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

## 2015-11-06 NOTE — Progress Notes (Signed)
Internal Medicine Clinic Attending  I saw and evaluated the patient.  I personally confirmed the key portions of the history and exam documented by Dr. Johnson and I reviewed pertinent patient test results.  The assessment, diagnosis, and plan were formulated together and I agree with the documentation in the resident's note.  

## 2015-11-11 ENCOUNTER — Ambulatory Visit: Payer: Self-pay

## 2015-11-17 ENCOUNTER — Ambulatory Visit: Payer: Self-pay

## 2015-11-25 ENCOUNTER — Ambulatory Visit: Payer: Self-pay

## 2015-11-30 ENCOUNTER — Ambulatory Visit: Payer: Self-pay

## 2015-12-23 IMAGING — CR DG CHEST 2V
2 series · 2 of 2 positions shown · non-contrast
Comparison: 07/27/2011

CLINICAL DATA: Chest pain

EXAM:
CHEST  2 VIEW

[w chest pa]
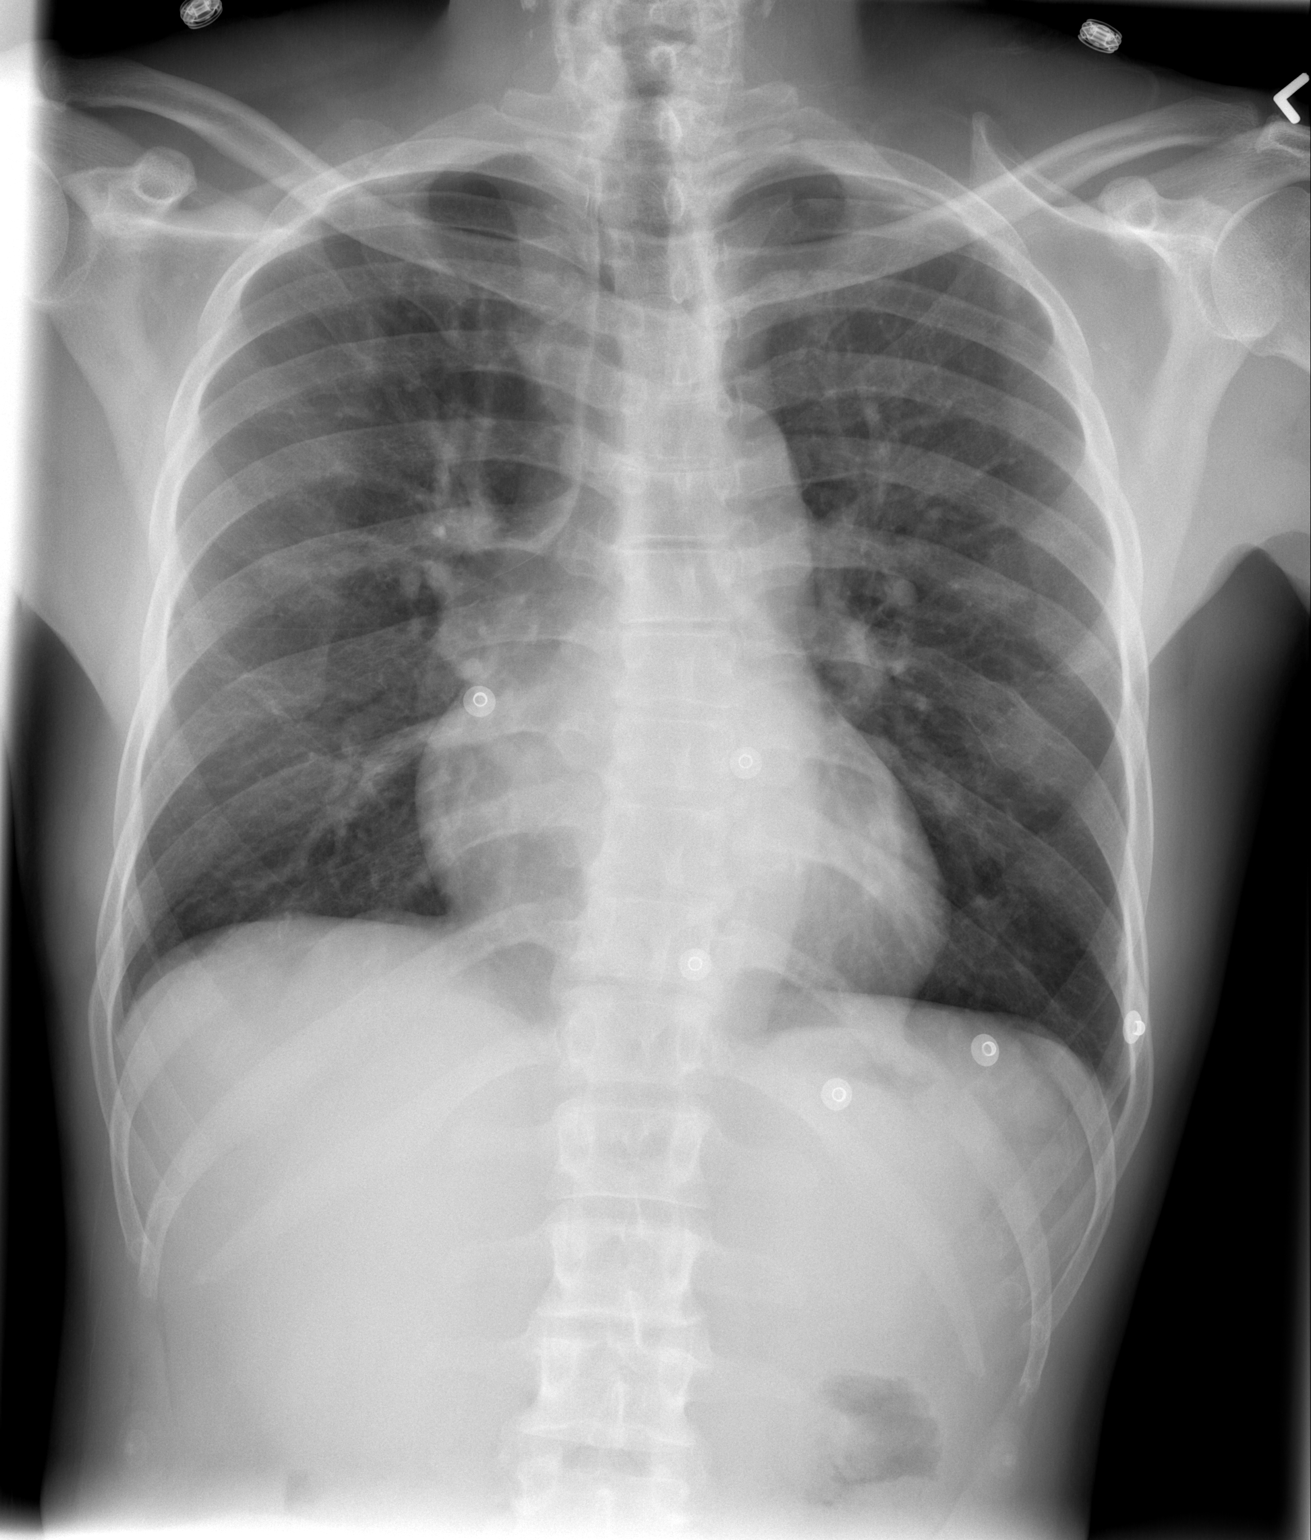

[w chest lat]
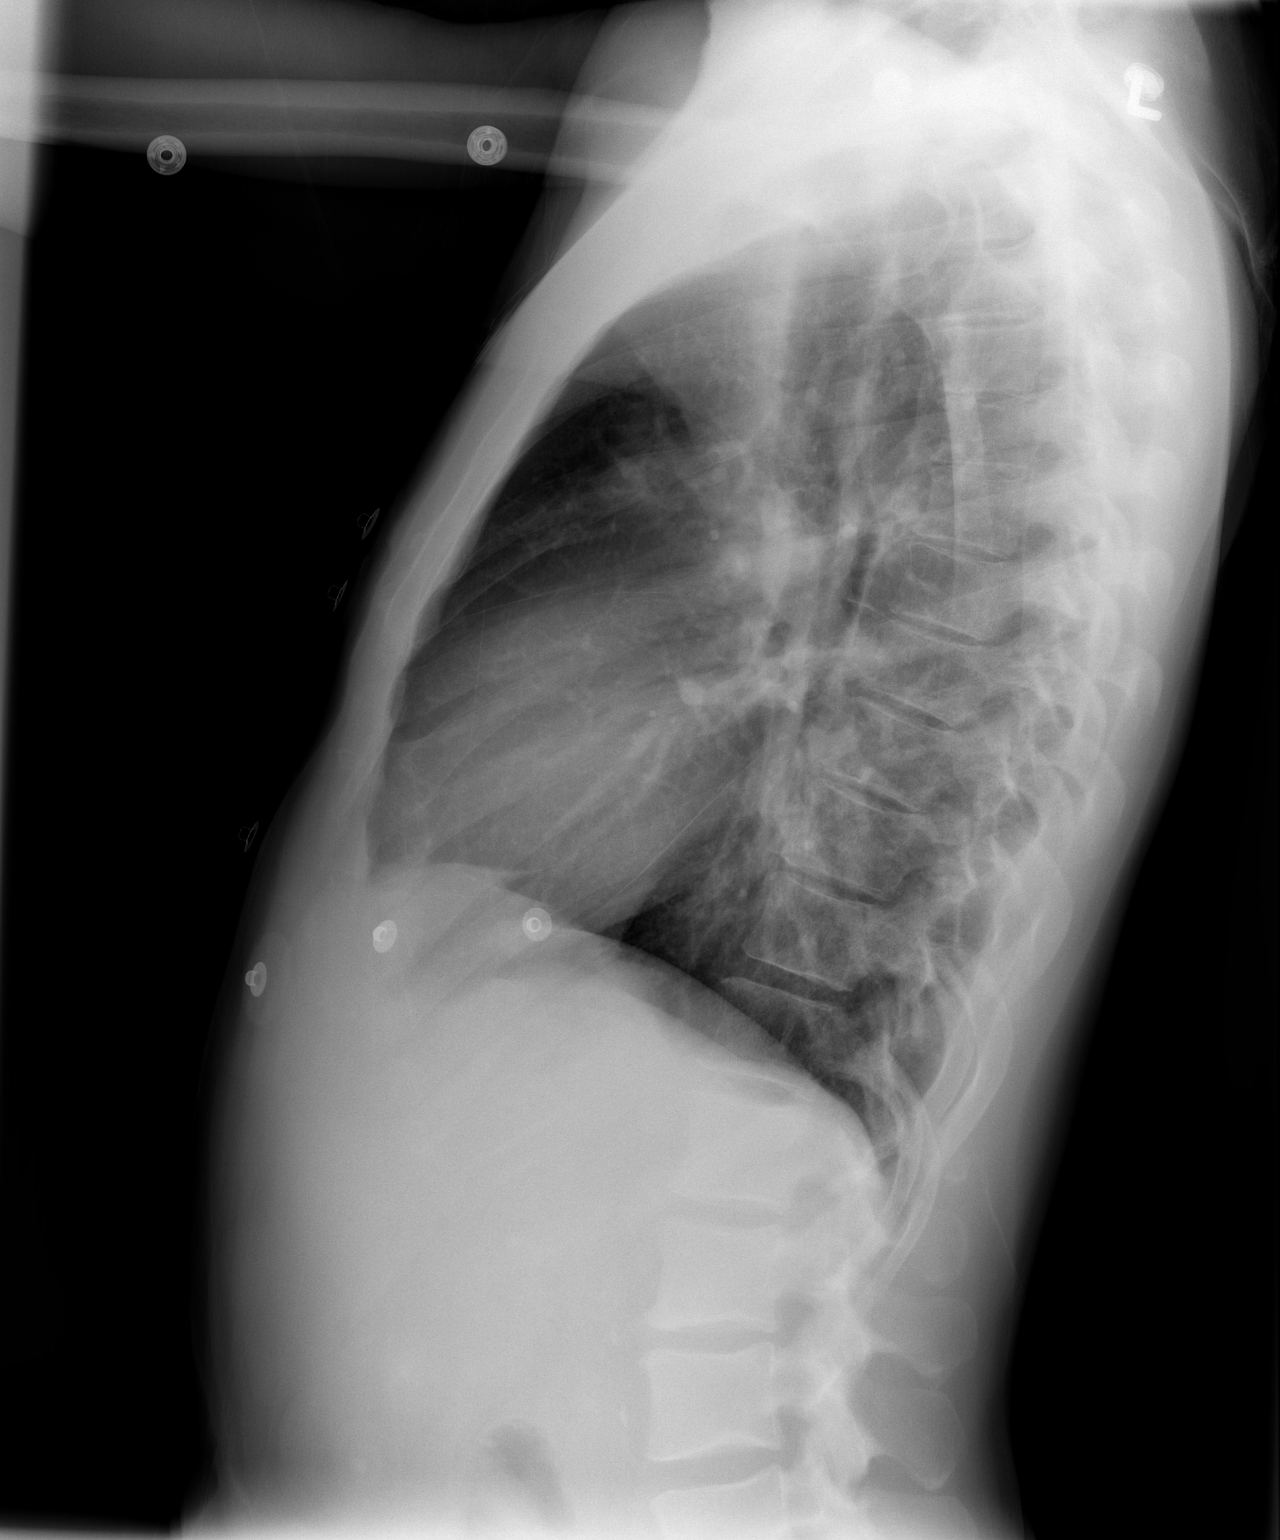

[2 of 2 positions shown; findings below may reference images not displayed]

FINDINGS: The heart size and mediastinal contours are within normal limits.
Both lungs are clear. The visualized skeletal structures are
unremarkable. Minimal prominence of the central hilar vasculature is
reidentified, which may indicate mild central vascular congestion.
IMPRESSION: No active cardiopulmonary disease.

## 2015-12-23 MED FILL — FENOFIBRATE 145 MG TABLET: 145 | 30 days supply | Qty: 30 | Fill #0

## 2015-12-23 MED FILL — ATORVASTATIN 20 MG TABLET: 20 | 30 days supply | Qty: 30 | Fill #0

## 2015-12-29 ENCOUNTER — Ambulatory Visit (INDEPENDENT_AMBULATORY_CARE_PROVIDER_SITE_OTHER): Payer: Self-pay | Admitting: Internal Medicine

## 2015-12-29 ENCOUNTER — Encounter: Payer: Self-pay | Admitting: Internal Medicine

## 2015-12-29 VITALS — BP 135/86 | HR 114 | Temp 98.5°F | Ht 66.0 in | Wt 113.2 lb

## 2015-12-29 DIAGNOSIS — F17210 Nicotine dependence, cigarettes, uncomplicated: Secondary | ICD-10-CM

## 2015-12-29 DIAGNOSIS — R569 Unspecified convulsions: Secondary | ICD-10-CM

## 2015-12-29 DIAGNOSIS — F1029 Alcohol dependence with unspecified alcohol-induced disorder: Secondary | ICD-10-CM

## 2015-12-29 DIAGNOSIS — I10 Essential (primary) hypertension: Secondary | ICD-10-CM

## 2015-12-29 DIAGNOSIS — E781 Pure hyperglyceridemia: Secondary | ICD-10-CM

## 2015-12-29 DIAGNOSIS — F1020 Alcohol dependence, uncomplicated: Secondary | ICD-10-CM

## 2015-12-29 DIAGNOSIS — F17200 Nicotine dependence, unspecified, uncomplicated: Secondary | ICD-10-CM

## 2015-12-29 DIAGNOSIS — G40909 Epilepsy, unspecified, not intractable, without status epilepticus: Secondary | ICD-10-CM

## 2015-12-29 DIAGNOSIS — Z79899 Other long term (current) drug therapy: Secondary | ICD-10-CM

## 2015-12-29 MED ORDER — FOLIC ACID 1 MG PO TABS: 1.0000 mg | ORAL_TABLET | Freq: Every day | ORAL | 3 refills | Status: DC

## 2015-12-29 MED ORDER — VITAMIN B-1 100 MG PO TABS: 100.0000 mg | ORAL_TABLET | Freq: Every day | ORAL | 3 refills | Status: DC

## 2015-12-29 MED ORDER — PHENYTOIN SODIUM EXTENDED 300 MG PO CAPS: 300.0000 mg | ORAL_CAPSULE | Freq: Every day | ORAL | 3 refills | Status: DC

## 2015-12-29 MED FILL — FOLIC ACID 1 MG TABLET: 1 | 30 days supply | Qty: 30 | Fill #0

## 2015-12-29 MED FILL — PHENYTOIN SOD EXT 300 MG CA: 300 | 30 days supply | Qty: 30 | Fill #0

## 2015-12-29 NOTE — Patient Instructions (Addendum)
Mr. Martinique it was nice seeing you today.  -Continue taking Verapamil  -Continue taking Dilantin  -Start taking Folic acid as instructed  -Start taking Thiamine as instructed   -I have ordered some labs and will call you when I get the results.

## 2015-12-30 LAB — LIPID PANEL
CHOLESTEROL TOTAL: 274 mg/dL — AB (ref 100–199)
Chol/HDL Ratio: 3.1 ratio units (ref 0.0–5.0)
HDL: 88 mg/dL (ref >39)
LDL Calculated: 151 mg/dL — ABNORMAL HIGH (ref 0–99)
TRIGLYCERIDES: 175 mg/dL — AB (ref 0–149)
VLDL CHOLESTEROL CAL: 35 mg/dL (ref 5–40)

## 2015-12-30 LAB — MAGNESIUM: MAGNESIUM: 1.6 mg/dL (ref 1.6–2.3)

## 2015-12-30 MED ORDER — MAGNESIUM CHLORIDE ER 535 (64 MG) MG PO TBCR: 128.0000 mg | EXTENDED_RELEASE_TABLET | Freq: Every day | ORAL | 3 refills | Status: DC

## 2015-12-30 MED ORDER — MAGNESIUM CHLORIDE ER 535 (64 MG) MG PO TBCR: 128.0000 mg | EXTENDED_RELEASE_TABLET | Freq: Every day | ORAL | 1 refills | Status: DC

## 2015-12-30 MED FILL — MAG64 DR 64 MG TABLET: 535 (64 MG) | 30 days supply | Qty: 60 | Fill #0

## 2015-12-30 NOTE — Progress Notes (Signed)
   CC: Patient is here to discuss his chronic medical conditions including tobacco use, alcohol dependence, hypertriglyceridemia, hypertension, and seizures.  HPI:  [name] is a 50 y.o. male with a past medical history of conditions listed below presenting to the clinic to discuss his chronic medical conditions including tobacco use, alcohol dependence, hypertriglyceridemia, hypertension, and seizures. Please see problem based charting for the status of the patient's current and chronic medical conditions.   Past Medical History:  Diagnosis Date  . Alcohol abuse   . Alcohol related seizure (Joseph Underwood)   . Hiccups   . HTN (hypertension)     Review of Systems:   Review of Systems  Constitutional: Negative for chills and fever.  Respiratory: Negative for shortness of breath and wheezing.   Cardiovascular: Negative for chest pain and leg swelling.  Gastrointestinal: Negative for abdominal pain, nausea and vomiting.    Physical Exam:  Vitals:   12/29/15 1507  BP: 135/86  Pulse: (!) 114  Temp: 98.5 F (36.9 C)  TempSrc: Oral  SpO2: 99%  Weight: 113 lb 3.2 oz (51.3 kg)  Height: 5\' 6"  (1.676 m)   Physical Exam  Constitutional: He is oriented to person, place, and time. No distress.  Noted to have hiccups and hand tremors (chronic).  HENT:  Head: Normocephalic and atraumatic.  Mouth/Throat: Oropharynx is clear and moist.  Eyes: EOM are normal.  Neck: Neck supple. No tracheal deviation present.  Cardiovascular: Normal rate, regular rhythm and intact distal pulses.   Pulmonary/Chest: Effort normal and breath sounds normal. No respiratory distress. He has no wheezes. He has no rales.  Abdominal: Soft. Bowel sounds are normal. He exhibits no distension. There is no tenderness. There is no guarding.  Musculoskeletal: Normal range of motion. He exhibits no edema.  Neurological: He is alert and oriented to person, place, and time.  Skin: Skin is warm and dry.    Assessment &  Plan:   See Encounters Tab for problem based charting.  Patient discussed with Dr. Eppie Gibson

## 2015-12-30 NOTE — Assessment & Plan Note (Signed)
Assessment Patient continues to drink liquor 3 times a week and is not interested in quitting. Continues to be tachycardic. Continues to have hiccups and hand tremors. States he has been to [facility] in the past and they did not help him. I offered help from social work and discussed other treatment options but patient declined any help. He has a history of alcohol withdrawal seizures in the past. Denies having any seizures recently. States he is not taking his folic acid supplement and not taking thiamine. Magnesium level checked at this visit is low (1.6).  Plan -Advised patient to start taking folic acid and thiamine -Start magnesium supplement -Discuss again at next visit

## 2015-12-30 NOTE — Assessment & Plan Note (Signed)
Assessment Patient is currently taking atorvastatin 20 mg daily and fenofibrate 145 mg daily. Triglyceride level was 1444 in August 2017. Lipid panel at this visit showing triglyceride level of 175. Hypertriglyceridemia likely in the setting of excess alcohol consumption.  Plan -Continue current management

## 2015-12-30 NOTE — Assessment & Plan Note (Signed)
Assessment Patient is smoking one half pack per day and continues to be in the pre-contemplative stage of quitting.   Plan -Discuss again at next visit

## 2015-12-30 NOTE — Assessment & Plan Note (Signed)
Assessment Patient reports compliance with Dilantin and denies having any seizures since his previous visit.  Plan -Continue Dilantin

## 2015-12-30 NOTE — Assessment & Plan Note (Addendum)
BP Readings from Last 3 Encounters:  12/29/15 135/86  11/05/15 (!) 151/94  10/13/15 140/90    Lab Results  Component Value Date   NA 144 09/08/2015   K 4.9 09/08/2015   CREATININE 0.77 09/08/2015    Assessment: Blood pressure control:  below goal Comments: Current medication regimen includes verapamil 80 mg 3 times a day. Continues to be tachycardic, likely in the setting of inconsistent alcohol use and possible withdrawal (drinks liquor 3 times a week). No symptoms of tachycardic myopathy. Echo done in June 2013 not suggestive of dilated cardiomyopathy, left ventricular ejection fraction 55-60%.  Plan: Medications:  continue current medications Educational resources provided:   Educated patient about healthy eating and exercise.

## 2015-12-31 NOTE — Progress Notes (Signed)
Case discussed with Dr. Rathore at the time of the visit.  We reviewed the resident's history and exam and pertinent patient test results.  I agree with the assessment, diagnosis, and plan of care documented in the resident's note. 

## 2016-04-12 ENCOUNTER — Encounter: Payer: Self-pay | Admitting: Internal Medicine

## 2016-04-12 ENCOUNTER — Ambulatory Visit (INDEPENDENT_AMBULATORY_CARE_PROVIDER_SITE_OTHER): Payer: Self-pay | Admitting: Internal Medicine

## 2016-04-12 VITALS — BP 164/97 | HR 113 | Temp 98.5°F | Ht 66.0 in | Wt 125.9 lb

## 2016-04-12 DIAGNOSIS — F1029 Alcohol dependence with unspecified alcohol-induced disorder: Secondary | ICD-10-CM

## 2016-04-12 DIAGNOSIS — I10 Essential (primary) hypertension: Secondary | ICD-10-CM

## 2016-04-12 DIAGNOSIS — Z79899 Other long term (current) drug therapy: Secondary | ICD-10-CM

## 2016-04-12 DIAGNOSIS — Z9181 History of falling: Secondary | ICD-10-CM

## 2016-04-12 DIAGNOSIS — Z0000 Encounter for general adult medical examination without abnormal findings: Secondary | ICD-10-CM

## 2016-04-12 DIAGNOSIS — F10939 Alcohol use, unspecified with withdrawal, unspecified: Secondary | ICD-10-CM

## 2016-04-12 DIAGNOSIS — E781 Pure hyperglyceridemia: Secondary | ICD-10-CM

## 2016-04-12 DIAGNOSIS — R569 Unspecified convulsions: Secondary | ICD-10-CM

## 2016-04-12 DIAGNOSIS — F10239 Alcohol dependence with withdrawal, unspecified: Secondary | ICD-10-CM

## 2016-04-12 DIAGNOSIS — G40509 Epileptic seizures related to external causes, not intractable, without status epilepticus: Secondary | ICD-10-CM

## 2016-04-12 DIAGNOSIS — F17200 Nicotine dependence, unspecified, uncomplicated: Secondary | ICD-10-CM

## 2016-04-12 DIAGNOSIS — F17210 Nicotine dependence, cigarettes, uncomplicated: Secondary | ICD-10-CM

## 2016-04-12 DIAGNOSIS — Z7189 Other specified counseling: Secondary | ICD-10-CM | POA: Insufficient documentation

## 2016-04-12 DIAGNOSIS — N529 Male erectile dysfunction, unspecified: Secondary | ICD-10-CM

## 2016-04-12 MED ORDER — FENOFIBRATE 145 MG PO TABS: 145.0000 mg | ORAL_TABLET | Freq: Every day | ORAL | 1 refills | Status: DC

## 2016-04-12 MED ORDER — ATORVASTATIN CALCIUM 20 MG PO TABS: 20.0000 mg | ORAL_TABLET | Freq: Every day | ORAL | 1 refills | Status: DC

## 2016-04-12 MED ORDER — NALTREXONE HCL 50 MG PO TABS: 50.0000 mg | ORAL_TABLET | Freq: Every day | ORAL | 1 refills | Status: DC

## 2016-04-12 MED ORDER — SILDENAFIL CITRATE 20 MG PO TABS: 20.0000 mg | ORAL_TABLET | Freq: Every day | ORAL | 0 refills | Status: DC | PRN

## 2016-04-12 NOTE — Assessment & Plan Note (Addendum)
Office visit (04/13/2016): Reports having a seizure 2 weeks ago. States he was sitting on his couch and then woke up drooling and confused. Denies having any trauma to his head or any other injuries. States he lost control of his bladder during this episode. Patient is currently taking phenytoin 300 mg daily. Reports missing 2 doses of phenytoin prior to his seizure and 3 doses after. Phenytoin level was in the therapeutic range in July 2017. Differentials for his seizure include alcohol withdrawal versus medication noncompliance. -Counseled patient on alcohol cessation. He has finally agreed to starting pharmacotherapy. -Emphasized the importance of medication compliance    Office Visit (10/13/2015)  - Shela Leff, MD    Assessment Patient denies having any seizures since his last visit. Currently taking phenytoin 300 mg daily. Phenytoin level checked during previous visit was normal. Seizures in the past were likely in the setting of alcohol withdrawal.  Plan -Continue to monitor    Office Visit (09/01/2015)  - Shela Leff, MD    HPI Patient reports have an episode of seizure activity about a month ago. States his whole body was shaking and he bit his tongue a little. Reports being confused afterwards. States he stopped taking phenytoin a few months ago and then restarted the medication after this episode. He has a history of alcohol dependence and reports drinking 3-4 shots of liquor every 4 days.   A Possible tonic clonic seizures. It is not clear whether his seizure is secondary to medication non-compliance vs. alcohol withdrawal.   P -check phenytoin level -continue Phenytoin  Addendum 09/11/15: Phenytoin level checked at this visit is normal.     Previous Version  Office Visit (03/21/2014)  - Wilber Oliphant, MD    No more seizures since hospital admission, but continues to drink alcohol. Delirium resolved during admission.  1/5th of gin he says lasts him 2  days. He does not wish to stop drinking and refused social work consultation during the visit at this time. I continued to encouraged alcohol cessation safely and consider detox program for assistance. He refused but will hopefully consider it. He does unfortunately continue to drive, which I have again counseled him to NOT drive and he arranged for someone to pick him up from clinic today. He is not to drive for at least 6 months and will need to be reassessed by PCP. He voices understanding and says he will arrange for transportation otherwise.

## 2016-04-12 NOTE — Assessment & Plan Note (Addendum)
Office visit 04/12/2016: Patient states he has been unemployed and not paid child support since May 2016. States he has a court hearing this Friday. He is requesting paperwork to take to the court stating he is not able to work. States otherwise he will be put in jail.  Patient has a history of alcohol dependence and experiences intermittent dizziness and confusion. He has a history of falls likely secondary to his alcohol use. In addition, he has a history of seizures likely related to alcohol withdrawal; most recent episode 2 weeks ago as reported by patient. 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 Based on his symptoms, it is not safe for the patient to engage in physical labor. He has been noted to be tremulous and tachycardic during his clinic visits. Patient told me at this visit he is drinking a total of 2-3 beers and 1 shot of hard liquor per week, however, his breath was smelling of alcohol. He has previously not been interested in quitting despite counseling from multiple providers including myself. Patient has been counseled on alcohol cessation by multiple providers at this clinic since 2012 and has been declining help in the past. I had another discussion with him at this visit and he has finally agreed to starting pharmacotherapy to help with alcohol cessation.   -Start Naltrexone 50 mg daily -F/u visit in 3 months    Office Visit (07/01/2014)  - Karlene Einstein, MD    Reports that he was drinking 1 pint of gin every other day until 6 days ago, when alcohol did not sound appealing. States that he is not otherwise ready to give up drinking alcohol.  - Continue to offer cessation support    Office Visit (05/22/2014)  - Francesca Oman, DO    When questioned he still reports occasional dizziness when walking around and from seated to standing.  He describes feeling as if he is about to pass out and not the sensation of spinning.  He says it  happens occasionally but every day.  Says it "does not get too bad."  He continues to drink 4 days out of the week.   He drinks 1 pint of gin on days he drinks.  He says he has been drinking for 20 years but increased his drinking 3 years ago.  He does not know why he increased his drinking at that time.  He last drank yesterday evening.  He does not relate the dizziness to drinking.  He denies recurrent seizure since last admission.  He sometimes feels he needs a drink in the AM but denies interference with personal or work life.  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  When asked if he wants to quit drinking he says "Yes and No."   He fell last week and hit his head on the bumper of a car he was working on.  He denies lightheadedness at that time but says the pressure from the machine he was using pushed back against him and caused him to fall.  He denies LOC or change in vision.  He says he was not drinking on the day this occurred. The pain at the back of his head is improving.  Today the patient appears in withdrawal - he is sweaty, tremulous, tachycardic and hypertensive.   - he is ambivalent about quitting despite being informed about the negative consequences of drinking upon his health and despite multiple admissions for EtOH  related issues and withdrawal - admission to inpatient today for severe hypomagnesemia (Mg 0.8) and need for IV replacement given concern for cardiac and neuro consequences (especially given seizure hx) with this level of depletion - CIWA     Previous Version  Office Visit (04/15/2014)  - Otho Bellows, MD    Pt continues to drink approx 4x week, and drinks 1/2 pint of gin when he drinks. Today he is tremulous and tachycardic. His last drink was on Sunday. He is not interested in quitting drinking, despite counseling from multiple providers including myself. He is having dizziness and lightheadedness at times, which seems to be related to his EtOH use. Possible that he has  Wernicke encephalopathy resulting in the gait ataxia and dizzy feeling, so will prescribe thiamine. - Continue to encourage cessation - Thiamine 100mg  po daily     Previous Version  Office Visit (03/21/2014)  - Wilber Oliphant, MD    Continues to drink alcohol. LFTs trending up. No intention to quit at this time. Hepatin lesion on CT during admission that will need to be further investigated. Hopefully, he will consider cessation

## 2016-04-12 NOTE — Patient Instructions (Addendum)
Joseph Underwood it was nice seeing you today.  -Start taking Naltrexone 50 mg daily to help you quit drinking alcohol.  -Take Sildenafil 20 mg once daily as needed for erectile dysfunction.   -Take Verapamil 80 mg 3 times a day for your high blood pressure.   -Return for a follow-up visit in 3 months.

## 2016-04-13 ENCOUNTER — Encounter: Payer: Self-pay | Admitting: Internal Medicine

## 2016-04-13 DIAGNOSIS — N529 Male erectile dysfunction, unspecified: Secondary | ICD-10-CM | POA: Insufficient documentation

## 2016-04-13 MED FILL — SILDENAFIL 20 MG TABLET: 20 | 30 days supply | Qty: 30 | Fill #0

## 2016-04-13 MED FILL — ATORVASTATIN 20 MG TABLET: 20 | 30 days supply | Qty: 30 | Fill #0

## 2016-04-13 MED FILL — FENOFIBRATE 145 MG TABLET: 145 | 30 days supply | Qty: 30 | Fill #0

## 2016-04-13 MED FILL — NALTREXONE 50 MG TABLET: 50 | 30 days supply | Qty: 30 | Fill #0

## 2016-04-13 NOTE — Assessment & Plan Note (Signed)
Fenofibrate and Lipitor refilled per patient request. -He will need a repeat lipid panel in 6 months.

## 2016-04-13 NOTE — Assessment & Plan Note (Signed)
BP Readings from Last 3 Encounters:  04/12/16 (!) 164/97  12/29/15 135/86  11/05/15 (!) 151/94    Lab Results  Component Value Date   NA 144 09/08/2015   K 4.9 09/08/2015   CREATININE 0.77 09/08/2015    Assessment: Blood pressure control:  above goal Comments: Current medication regimen includes verapamil 80 mg 3 times a day. Patient states he is taking this medication only once a day.  Plan: Medications:  continue current medications. Explained to him the importance of taking this medication 3 times a day as prescribed. Educational resources provided:   Educated patient about healthy eating and exercise.  Other: -F/u in 3 months

## 2016-04-13 NOTE — Assessment & Plan Note (Signed)
Referral to GI for screening colonoscopy 

## 2016-04-13 NOTE — Progress Notes (Signed)
   CC: Patient is here to get a letter for the court. He is also complaining of not being able to "perform."  HPI:  Joseph Underwood is a 51 y.o. male with a past medical history of conditions listed below presenting to the clinic to get a letter for the court. Erectile dysfunction, hypertension, alcohol abuse, alcohol withdrawal seizures, and tobacco abuse were also discussed during this visit. Pertinent positives mentioned in HPI. Remainder of all ROS negative. Please see problem based charting for the status of the patient's current and chronic medical conditions.    Past Medical History:  Diagnosis Date  . Alcohol abuse   . Alcohol related seizure ([place])   . Hiccups   . HTN (hypertension)     Review of Systems:  Pertinent positives mentioned in HPI. Remainder of all ROS negative.   Physical Exam:  Vitals:   04/12/16 1604 04/12/16 1651  BP: (!) 165/90 (!) 164/97  Pulse: (!) 114 (!) 113  Temp: 98.5 F (36.9 C)   TempSrc: Oral   SpO2: 100%   Weight: 125 lb 14.4 oz (57.1 kg)   Height: 5\' 6"  (1.676 m)    Physical Exam  Constitutional: He is oriented to person, place, and time. No distress.  Breath smells of alcohol  Tremulous, hiccups (chronic)  HENT:  Head: Normocephalic and atraumatic.  Mouth/Throat: Oropharynx is clear and moist.  Eyes: EOM are normal.  Neck: Neck supple. No tracheal deviation present.  Cardiovascular: Tachycardic (chronic). Regular rhythm and intact distal pulses.   Pulmonary/Chest: Effort normal and breath sounds normal. No respiratory distress. He has no wheezes. He has no rales.  Abdominal: Soft. Bowel sounds are normal. He exhibits no distension. There is no tenderness. There is no guarding.  Musculoskeletal: Normal range of motion. He exhibits no edema.  Neurological: He is alert and oriented to person, place, and time.  Skin: Skin is warm and dry.    Assessment & Plan:   See Encounters Tab for problem based charting.  Patient discussed  with Dr. Lynnae January

## 2016-04-13 NOTE — Assessment & Plan Note (Addendum)
Patient is complaining of not being able to "perform" with his girlfriend. States he has been worried about his upcoming court case. He is requesting a medication to help with his erectile dysfunction. It is difficult to assess whether the patient's erectile dysfunction is organic in nature as he is not able to tell whether he continues to have morning erections. His alcohol dependence could certainly be playing a role.  -Sildenafil 20 mg once daily as needed. This is the dose that will be available to the patient at the cheapest price ($17.65 for 30 tablets at [facility]).  -Emphasized the importance of alcohol cessation. She has finally agreed to starting pharmacotherapy.

## 2016-04-13 NOTE — Assessment & Plan Note (Signed)
Assessment Patient continues to smoke 4-5 cigarettes per day. I discussed options for smoking cessation and he appears to be in the pre-contemplative stage at this time.  Plan -Discuss again at next visit

## 2016-04-14 ENCOUNTER — Encounter: Payer: Self-pay | Admitting: *Deleted

## 2016-04-14 NOTE — Progress Notes (Signed)
Internal Medicine Clinic Attending  Case discussed with Dr. Rathoreat the time of the visit. We reviewed the resident's history and exam and pertinent patient test results. I agree with the assessment, diagnosis, and plan of care documented in the resident's note.  

## 2016-04-18 ENCOUNTER — Encounter: Payer: Self-pay | Admitting: Gastroenterology

## 2016-05-24 ENCOUNTER — Telehealth: Payer: Self-pay

## 2016-05-24 NOTE — Telephone Encounter (Signed)
I do not need to see him if anesthesia feels comfortable having his procedure done in the [place]. I have copied this to Osvaldo Angst to inquire.

## 2016-05-24 NOTE — Telephone Encounter (Signed)
Dr. Danis,  Rahul Martinique MR # [id number] is scheduled for a colonoscopy 06/14/16 @ 10:30 Am. He is a direct screening patient. In reviewing his chart, I see that the patient had a seizure a last July. Primary care office note dated 09/01/15 states patient has not been taking Phenytoin for a few months. He started back after he has a seizure a couple of weeks prior to the office visit on 09/01/15. It is not clear if the seizure is secondary to medication non-compliance vs. Alcohol withdrawal.   Do you want to see this patient in the office prior to the procedure? [facility]'s pre-visit is scheduled for Tuesday 05/31/16. Please advise. Thanks.    Riki Sheer, LPN

## 2016-05-24 NOTE — Telephone Encounter (Signed)
Joseph Underwood and Dr Loletha Carrow,  This pt is cleared for anesthetic care at [facility].  Thanks.  Joseph Underwood

## 2016-05-31 ENCOUNTER — Ambulatory Visit (AMBULATORY_SURGERY_CENTER): Payer: Self-pay

## 2016-05-31 VITALS — Ht 65.0 in | Wt 122.3 lb

## 2016-05-31 DIAGNOSIS — Z1211 Encounter for screening for malignant neoplasm of colon: Secondary | ICD-10-CM

## 2016-05-31 MED ORDER — NA SULFATE-K SULFATE-MG SULF 17.5-3.13-1.6 GM/177ML PO SOLN: 1.0000 | Freq: Once | ORAL | 0 refills | Status: AC

## 2016-05-31 NOTE — Progress Notes (Signed)
Denies allergies to eggs or soy products. Denies complication of anesthesia or sedation. Denies use of weight loss medication. Denies use of O2.   Emmi instructions declined patient does not have computer.

## 2016-06-08 ENCOUNTER — Ambulatory Visit: Payer: Self-pay

## 2016-06-14 ENCOUNTER — Ambulatory Visit (AMBULATORY_SURGERY_CENTER): Payer: Self-pay | Admitting: Gastroenterology

## 2016-06-14 ENCOUNTER — Encounter: Payer: Self-pay | Admitting: Gastroenterology

## 2016-06-14 VITALS — BP 156/97 | HR 92 | Temp 97.8°F | Resp 13 | Ht 65.0 in | Wt 122.0 lb

## 2016-06-14 DIAGNOSIS — K635 Polyp of colon: Secondary | ICD-10-CM

## 2016-06-14 DIAGNOSIS — Z1211 Encounter for screening for malignant neoplasm of colon: Secondary | ICD-10-CM

## 2016-06-14 DIAGNOSIS — Z1212 Encounter for screening for malignant neoplasm of rectum: Secondary | ICD-10-CM

## 2016-06-14 DIAGNOSIS — D125 Benign neoplasm of sigmoid colon: Secondary | ICD-10-CM

## 2016-06-14 DIAGNOSIS — D123 Benign neoplasm of transverse colon: Secondary | ICD-10-CM

## 2016-06-14 HISTORY — DX: Polyp of colon: K63.5

## 2016-06-14 HISTORY — PX: COLONOSCOPY W/ POLYPECTOMY: SHX1380

## 2016-06-14 MED ORDER — SODIUM CHLORIDE 0.9 % IV SOLN: 500.0000 mL | INTRAVENOUS | Status: DC

## 2016-06-14 NOTE — Progress Notes (Signed)
Called to room to assist during endoscopic procedure.  Patient ID and intended procedure confirmed with present staff. Received instructions for my participation in the procedure from the performing physician.  

## 2016-06-14 NOTE — Patient Instructions (Signed)
Discharge instructions given. Handouts on polyps and diverticulosis. No aspirin,ibuprofen,naproxen, or other non-steroidal anti-inflammatory drugs for 7 days. Resume previous medications. YOU HAD AN ENDOSCOPIC PROCEDURE TODAY AT [facility]:   Refer to the procedure report that was given to you for any specific questions about what was found during the examination.  If the procedure report does not answer your questions, please call your gastroenterologist to clarify.  If you requested that your care partner not be given the details of your procedure findings, then the procedure report has been included in a sealed envelope for you to review at your convenience later.  YOU SHOULD EXPECT: Some feelings of bloating in the abdomen. Passage of more gas than usual.  Walking can help get rid of the air that was put into your GI tract during the procedure and reduce the bloating. If you had a lower endoscopy (such as a colonoscopy or flexible sigmoidoscopy) you may notice spotting of blood in your stool or on the toilet paper. If you underwent a bowel prep for your procedure, you may not have a normal bowel movement for a few days.  Please Note:  You might notice some irritation and congestion in your nose or some drainage.  This is from the oxygen used during your procedure.  There is no need for concern and it should clear up in a day or so.  SYMPTOMS TO REPORT IMMEDIATELY:   Following lower endoscopy (colonoscopy or flexible sigmoidoscopy):  Excessive amounts of blood in the stool  Significant tenderness or worsening of abdominal pains  Swelling of the abdomen that is new, acute  Fever of 100F or higher    For urgent or emergent issues, a gastroenterologist can be reached at any hour by calling [phone].   DIET:  We do recommend a small meal at first, but then you may proceed to your regular diet.  Drink plenty of fluids but you should avoid alcoholic beverages for 24  hours.  ACTIVITY:  You should plan to take it easy for the rest of today and you should NOT DRIVE or use heavy machinery until tomorrow (because of the sedation medicines used during the test).    FOLLOW UP: Our staff will call the number listed on your records the next business day following your procedure to check on you and address any questions or concerns that you may have regarding the information given to you following your procedure. If we do not reach you, we will leave a message.  However, if you are feeling well and you are not experiencing any problems, there is no need to return our call.  We will assume that you have returned to your regular daily activities without incident.  If any biopsies were taken you will be contacted by phone or by letter within the next 1-3 weeks.  Please call us at [phone] if you have not heard about the biopsies in 3 weeks.    SIGNATURES/CONFIDENTIALITY: You and/or your care partner have signed paperwork which will be entered into your electronic medical record.  These signatures attest to the fact that that the information above on your After Visit Summary has been reviewed and is understood.  Full responsibility of the confidentiality of this discharge information lies with you and/or your care-partner.

## 2016-06-14 NOTE — Op Note (Signed)
[place] Patient Name: Joseph Underwood Procedure Date: 06/14/2016 10:37 AM MRN: [id number] Endoscopist: Mallie Mussel L. Loletha Carrow , MD Age: 51 Referring MD:  Date of Birth: 1965/06/21 Gender: Male Account #: [id number] Procedure:                Colonoscopy Indications:              Screening for colorectal malignant neoplasm, This                            is the patient's first colonoscopy Medicines:                Monitored Anesthesia Care Procedure:                Pre-Anesthesia Assessment:                           - Prior to the procedure, a History and Physical                            was performed, and patient medications and                            allergies were reviewed. The patient's tolerance of                            previous anesthesia was also reviewed. The risks                            and benefits of the procedure and the sedation                            options and risks were discussed with the patient.                            All questions were answered, and informed consent                            was obtained. Prior Anticoagulants: The patient has                            taken no previous anticoagulant or antiplatelet                            agents. ASA Grade Assessment: II - A patient with                            mild systemic disease. After reviewing the risks                            and benefits, the patient was deemed in                            satisfactory condition to undergo the procedure.  After obtaining informed consent, the colonoscope                            was passed under direct vision. Throughout the                            procedure, the patient's blood pressure, pulse, and                            oxygen saturations were monitored continuously. The                            Colonoscope was introduced through the anus and                            advanced to the the cecum,  identified by                            appendiceal orifice and ileocecal valve. The                            colonoscopy was performed without difficulty. The                            patient tolerated the procedure well. The quality                            of the bowel preparation was good. The ileocecal                            valve, appendiceal orifice, and rectum were                            photographed. The quality of the bowel preparation                            was evaluated using the BBPS [facility] Bowel                            Preparation Scale) with scores of: Right Colon = 2,                            Transverse Colon = 2 and Left Colon = 2. The total                            BBPS score equals 6. The bowel preparation used was                            SUPREP. Scope In: 10:54:44 AM Scope Out: 11:14:54 AM Total Procedure Duration: 0 hours 20 minutes 10 seconds  Findings:                 The perianal and digital rectal examinations were  normal.                           Two sessile polyps were found in the proximal                            transverse colon. The polyps were 4 mm in size.                            These polyps were removed with a cold snare.                            Resection and retrieval were complete.                           A 12 mm polyp was found in the proximal transverse                            colon. The polyp was pedunculated. The polyp was                            removed with a hot snare. Resection and retrieval                            were complete.                           A 10 mm polyp was found in the distal sigmoid                            colon. The polyp was pedunculated. The polyp was                            removed with a hot snare. Resection and retrieval                            were complete.                           Multiple diverticula were found in the entire  colon.                           The exam was otherwise without abnormality on                            direct and retroflexion views. Complications:            No immediate complications. Estimated Blood Loss:     Estimated blood loss: none. Impression:               - Two 4 mm polyps in the proximal transverse colon,                            removed with a cold snare. Resected and retrieved.                           -  One 12 mm polyp in the proximal transverse colon,                            removed with a hot snare. Resected and retrieved.                           - One 10 mm polyp in the distal sigmoid colon,                            removed with a hot snare. Resected and retrieved.                           - Diverticulosis in the entire examined colon.                           - The examination was otherwise normal on direct                            and retroflexion views. Recommendation:           - Patient has a contact number available for                            emergencies. The signs and symptoms of potential                            delayed complications were discussed with the                            patient. Return to normal activities tomorrow.                            Written discharge instructions were provided to the                            patient.                           - Resume previous diet.                           - Continue present medications.                           - No aspirin, ibuprofen, naproxen, or other                            non-steroidal anti-inflammatory drugs for 7 days                            after polyp removal.                           - Await pathology results.                           -  Repeat colonoscopy is recommended for                            surveillance. The colonoscopy date will be                            determined after pathology results from today's                            exam become  available for review. Satara Virella L. Loletha Carrow, MD 06/14/2016 11:19:36 AM This report has been signed electronically.

## 2016-06-14 NOTE — Progress Notes (Signed)
Spontaneous respirations throughout. VSS. Resting comfortably. To PACU on room air. Report to  Celia RN. 

## 2016-06-15 ENCOUNTER — Telehealth: Payer: Self-pay | Admitting: *Deleted

## 2016-06-15 NOTE — Telephone Encounter (Signed)
  Follow up Call-  Call back number 06/14/2016  Post procedure Call Back phone  # [phone] Sister [place] to talk with her, [phone] [place] to talk with her  Permission to leave phone message No  Some recent data might be hidden     Patient questions:  Do you have a fever, pain , or abdominal swelling? No. Pain Score  0 *  Have you tolerated food without any problems? Yes.    Have you been able to return to your normal activities? Yes.    Do you have any questions about your discharge instructions: Diet   No. Medications  No. Follow up visit  No.  Do you have questions or concerns about your Care? Yes.  PAtient noticed a small amount of blood on the toilet tissue after his first bowel movement yesterday evening but went a second time and it was normal. Instructed the patient that this was normal after the procedure but if he noticed a large amount of bleeding to notify us. He verbalized understanding. SM  Actions: * If pain score is 4 or above: No action needed, pain <4.

## 2016-06-20 ENCOUNTER — Encounter: Payer: Self-pay | Admitting: Gastroenterology

## 2016-07-05 ENCOUNTER — Ambulatory Visit (HOSPITAL_COMMUNITY)
Admission: RE | Admit: 2016-07-05 | Discharge: 2016-07-05 | Disposition: A | Discharge status: Home / Self Care | Payer: Self-pay | Source: Ambulatory Visit | Attending: Family Medicine | Admitting: Family Medicine

## 2016-07-05 ENCOUNTER — Ambulatory Visit (INDEPENDENT_AMBULATORY_CARE_PROVIDER_SITE_OTHER): Payer: Self-pay | Admitting: Internal Medicine

## 2016-07-05 ENCOUNTER — Encounter: Payer: Self-pay | Admitting: Internal Medicine

## 2016-07-05 VITALS — BP 118/75 | HR 110 | Temp 98.3°F | Ht 66.0 in | Wt 126.4 lb

## 2016-07-05 DIAGNOSIS — N529 Male erectile dysfunction, unspecified: Secondary | ICD-10-CM

## 2016-07-05 DIAGNOSIS — R000 Tachycardia, unspecified: Secondary | ICD-10-CM

## 2016-07-05 DIAGNOSIS — Z5181 Encounter for therapeutic drug level monitoring: Secondary | ICD-10-CM

## 2016-07-05 DIAGNOSIS — G40509 Epileptic seizures related to external causes, not intractable, without status epilepticus: Secondary | ICD-10-CM

## 2016-07-05 DIAGNOSIS — Z79899 Other long term (current) drug therapy: Secondary | ICD-10-CM

## 2016-07-05 DIAGNOSIS — F17210 Nicotine dependence, cigarettes, uncomplicated: Secondary | ICD-10-CM

## 2016-07-05 DIAGNOSIS — F10939 Alcohol use, unspecified with withdrawal, unspecified: Secondary | ICD-10-CM

## 2016-07-05 DIAGNOSIS — R569 Unspecified convulsions: Secondary | ICD-10-CM

## 2016-07-05 DIAGNOSIS — K7030 Alcoholic cirrhosis of liver without ascites: Secondary | ICD-10-CM

## 2016-07-05 DIAGNOSIS — Z720 Tobacco use: Secondary | ICD-10-CM

## 2016-07-05 DIAGNOSIS — F10239 Alcohol dependence with withdrawal, unspecified: Secondary | ICD-10-CM

## 2016-07-05 DIAGNOSIS — I10 Essential (primary) hypertension: Secondary | ICD-10-CM

## 2016-07-05 DIAGNOSIS — IMO0002 Reserved for concepts with insufficient information to code with codable children: Secondary | ICD-10-CM

## 2016-07-05 DIAGNOSIS — F10288 Alcohol dependence with other alcohol-induced disorder: Secondary | ICD-10-CM

## 2016-07-05 LAB — PROTIME-INR
INR: 1.05
PROTHROMBIN TIME: 13.7 s (ref 11.4–15.2)

## 2016-07-05 MED ORDER — SILDENAFIL CITRATE 20 MG PO TABS: 40.0000 mg | ORAL_TABLET | Freq: Every day | ORAL | 0 refills | Status: DC | PRN

## 2016-07-05 MED ORDER — DILTIAZEM HCL ER COATED BEADS 120 MG PO CP24: 120.0000 mg | ORAL_CAPSULE | Freq: Every day | ORAL | 0 refills | Status: DC

## 2016-07-05 MED FILL — SILDENAFIL 20 MG TABLET: 20 | 15 days supply | Qty: 30 | Fill #0

## 2016-07-05 MED FILL — CARTIA XT 120 MG CAPSULE SA: 120 | 30 days supply | Qty: 30 | Fill #0

## 2016-07-05 NOTE — Patient Instructions (Signed)
STOP taking verapamil  Start taking Cardizem as instructed for your blood pressure.   Please take Natrexone daily as instructed to help you quit drinking.   Continue taking Phenytoin as instructed to prevent seizures.   You may take 2 tablets of Sildenafil 20 mg daily once daily as needed for erectile dysfunction.   I have referred you to cardiology for a stress test.   Please return to the clinic in 1 month for blood pressure re-check.

## 2016-07-06 LAB — CMP14 + ANION GAP
ALBUMIN: 4.4 g/dL (ref 3.5–5.5)
ALK PHOS: 55 IU/L (ref 39–117)
ALT: 19 IU/L (ref 0–44)
AST: 33 IU/L (ref 0–40)
Albumin/Globulin Ratio: 1.5 (ref 1.2–2.2)
Anion Gap: 15 mmol/L (ref 10.0–18.0)
BILIRUBIN TOTAL: 0.3 mg/dL (ref 0.0–1.2)
BUN / CREAT RATIO: 13 (ref 9–20)
BUN: 13 mg/dL (ref 6–24)
CHLORIDE: 98 mmol/L (ref 96–106)
CO2: 27 mmol/L (ref 18–29)
Calcium: 9.8 mg/dL (ref 8.7–10.2)
Creatinine, Ser: 1.02 mg/dL (ref 0.76–1.27)
GFR calc Af Amer: 99 mL/min/{1.73_m2} (ref >59)
GFR calc non Af Amer: 85 mL/min/{1.73_m2} (ref >59)
GLOBULIN, TOTAL: 2.9 g/dL (ref 1.5–4.5)
Glucose: 74 mg/dL (ref 65–99)
POTASSIUM: 4.9 mmol/L (ref 3.5–5.2)
SODIUM: 140 mmol/L (ref 134–144)
Total Protein: 7.3 g/dL (ref 6.0–8.5)

## 2016-07-06 LAB — CBC
Hematocrit: 40.8 % (ref 37.5–51.0)
Hemoglobin: 13.7 g/dL (ref 13.0–17.7)
MCH: 31.4 pg (ref 26.6–33.0)
MCHC: 33.6 g/dL (ref 31.5–35.7)
MCV: 94 fL (ref 79–97)
PLATELETS: 255 10*3/uL (ref 150–379)
RBC: 4.36 x10E6/uL (ref 4.14–5.80)
RDW: 14.3 % (ref 12.3–15.4)
WBC: 8.7 10*3/uL (ref 3.4–10.8)

## 2016-07-06 NOTE — Progress Notes (Signed)
Internal Medicine Clinic Attending  Case discussed with Dr. Rathoreat the time of the visit. We reviewed the resident's history and exam and pertinent patient test results. I agree with the assessment, diagnosis, and plan of care documented in the resident's note.  

## 2016-07-06 NOTE — Assessment & Plan Note (Signed)
BP Readings from Last 3 Encounters:  07/05/16 118/75  06/14/16 (!) 156/97  04/12/16 (!) 164/97    Lab Results  Component Value Date   NA 140 07/05/2016   K 4.9 07/05/2016   CREATININE 1.02 07/05/2016    Assessment: Blood pressure control:  well controlled  Progress toward BP goal:   improved  Comments: Current regimen includes Verapamil 80 mg TID. Patient is taking this medication only once daily in the morning and may not be getting prolonged drug coverage. 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    Plan: Medications:  Will d/c Verapamil. Start long acting Cardizem XR 120 mg daily.  Educational resources provided:   Educated patient about healthy eating and exercise. Other plans: -RTC in 1 month for BP recheck

## 2016-07-06 NOTE — Assessment & Plan Note (Signed)
A Patient continues to smoke 5-6 cigarettes per day and is not ready to quit at this time.  P -Continue to counsel at future visits

## 2016-07-06 NOTE — Assessment & Plan Note (Addendum)
A Patient reports having a seizure 3 weeks ago despite being compliant with Phenytoin at that time. He does report missing a few doses of the medication but states that was after the seizure. He continues to drink a 6 pack of "light beer" per week.   P -Continue Phenytoin  -Check Phenytoin level   Addendum (07/08/2016 at 3 pm):  Dilantin level subtherapeutic (<2.5). Tried calling the patient but could not reach him over the phone. Will try again.   Addendum (07/12/2016 at 4:25 pm): Tried calling the patient again (both home and mobile numbers) and could not reach him. He does not have voicemail setup.   Addendum (07/13/2016 at 10:40 am): Tried calling the patient (both home and mobile numbers) and could not reach him. He does not have voicemail setup.   Addendum 07/18/2016 at 10:20 am: I was finally able to reach the patient over the phone. Discussed the lab result with him. Patient told me he ran out of Phenytoin a few days before his appointment. This would explain the low level.  -Advised him to continue taking this medication at the current dose  -Consider re-checking phenytoin level at a future visit

## 2016-07-06 NOTE — Assessment & Plan Note (Addendum)
A Patient is noted to be tachycardic during this visit and most of his previous visits. EKG at this visit showing sinus tachycardia with HR 102, Q waves in inferior leads, and T wave inversions in lateral leads. Patent is currently asymptomatic - no CP or dyspnea. Although he does mention having occasional DOE and diaphoresis while doing activities such as yardwork at home. Denies having any wheezing or cough during these episodes. He does have risk factors for CAD including age, gender, HLD, and smoking.   P -Cardiology referral for stess test -Continue Lipitor 20 mg daily. Will not increase dose of statin as he is also on Fenofibrate which increases the risk of myopathy.  -Continue to counsel him on smoking cessation

## 2016-07-06 NOTE — Progress Notes (Signed)
   CC: Pt is here for a regular checkup. HTN, alcohol use disorder, alcohol withdrawal seizures, liver cirrhosis, tachycardia, and erectile dysfunction were discussed during this visit.   HPI:  [name] is a 51 y.o. M with a PMHx of conditions listed below presenting to the clinic for a regular checkup. HTN, alcohol use disorder, alcohol withdrawal seizures, liver cirrhosis, tachycardia, and erectile dysfunction were discussed during this visit. Please see problem based charting for the status of the patient's current and chronic medical conditions.   Past Medical History:  Diagnosis Date  . Alcohol abuse   . Alcohol related seizure ([place])   . Allergy   . Hiccups   . HTN (hypertension)   . Hyperlipidemia     Review of Systems:   Review of Systems  Constitutional: Negative for chills and fever.  Respiratory: Negative for cough and wheezing.        Dyspnea on exertion   Cardiovascular: Negative for chest pain and leg swelling.  Gastrointestinal: Negative for constipation, diarrhea, nausea and vomiting.       Mild intermittent RUQ abdominal pain (chronic)  Skin: Negative for itching and rash.    Physical Exam:  Vitals:   07/05/16 1327  BP: 118/75  Pulse: (!) 110  Temp: 98.3 F (36.8 C)  TempSrc: Oral  SpO2: 98%  Weight: 126 lb 6.4 oz (57.3 kg)  Height: 5\' 6"  (1.676 m)   Physical Exam  Constitutional: He is oriented to person, place, and time. No distress.  HENT:  Head: Normocephalic and atraumatic.  Eyes: Right eye exhibits no discharge. Left eye exhibits no discharge.  Neck: Neck supple. No tracheal deviation present.  Cardiovascular: Regular rhythm and intact distal pulses.  Exam reveals no gallop and no friction rub.   Tachycardic  Pulmonary/Chest: Effort normal and breath sounds normal. No respiratory distress. He has no wheezes. He has no rales.  Abdominal: Soft. Bowel sounds are normal. He exhibits no distension. There is no rebound and no guarding.  RUQ  mildly TTP  Musculoskeletal: He exhibits no edema.  Neurological: He is alert and oriented to person, place, and time.  Skin: Skin is warm and dry.    Assessment & Plan:   See Encounters Tab for problem based charting.  Patient discussed with Dr. Evette Doffing

## 2016-07-06 NOTE — Assessment & Plan Note (Deleted)
A Patient is not taking Naltrexone prescribed to him during his previous visit. He has now stopped drinking liquor for the past 2 months is drinking a 6 pack of "light beer" per week. Patient thought he couldn't use Natrexone if he was still drinking. I explained to him how the drug works and that he should start taking it despite consuming alcohol as it may help reduce the amount of alcohol he consumes. Patient agreed.  P -Continue Natrexone 50 mg daily

## 2016-07-06 NOTE — Assessment & Plan Note (Signed)
A Patient continues to consume alcohol and continues to complain of mild intermittent RUQ abdominal pain which is not associated with eating. 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   P -Advised Joseph Underwood to take Nalrexone and work toward reducing his alcohol intake. The goal is to abstain completely.

## 2016-07-06 NOTE — Assessment & Plan Note (Signed)
A Patient is not taking Naltrexone prescribed to him during his previous visit. He has now stopped drinking liquor for the past 2 months is drinking a 6 pack of "light beer" per week. Patient thought he couldn't use Natrexone if he was still drinking. I explained to him how the drug works and that he should start taking it despite consuming alcohol as it may help reduce the amount of alcohol he consumes. Patient agreed.  P -Continue Natrexone 50 mg daily

## 2016-07-06 NOTE — Assessment & Plan Note (Signed)
A Patient states Sildenafil 20 mg dose is not helping him. I explained to him that his ED is likely related to continued ethanol use.   P -Advised him to take Naltrexone and work toward cutting down his drinking -Increase dose of Sildenafil to 40 mg once daily prn

## 2016-07-07 ENCOUNTER — Telehealth: Payer: Self-pay | Admitting: Internal Medicine

## 2016-07-07 NOTE — Telephone Encounter (Signed)
CALLED PATIENT, HE SAID HE MAILED HIS PAPER WORK, BUT I NEVER RECEIVED IT, HE WILL MAIL AGAIN

## 2016-07-08 ENCOUNTER — Telehealth: Payer: Self-pay | Admitting: *Deleted

## 2016-07-08 ENCOUNTER — Other Ambulatory Visit (INDEPENDENT_AMBULATORY_CARE_PROVIDER_SITE_OTHER): Payer: Self-pay

## 2016-07-08 DIAGNOSIS — F10239 Alcohol dependence with withdrawal, unspecified: Secondary | ICD-10-CM

## 2016-07-08 DIAGNOSIS — Z79899 Other long term (current) drug therapy: Secondary | ICD-10-CM

## 2016-07-08 DIAGNOSIS — G40509 Epileptic seizures related to external causes, not intractable, without status epilepticus: Secondary | ICD-10-CM

## 2016-07-08 DIAGNOSIS — Z5181 Encounter for therapeutic drug level monitoring: Secondary | ICD-10-CM

## 2016-07-08 DIAGNOSIS — R569 Unspecified convulsions: Principal | ICD-10-CM

## 2016-07-08 LAB — PHENYTOIN LEVEL, TOTAL: Phenytoin Lvl: <2.5 ug/mL — ABNORMAL LOW (ref 10.0–20.0)

## 2016-07-08 MED FILL — PHENYTOIN SOD EXT 300 MG CA: 300 | 30 days supply | Qty: 30 | Fill #1

## 2016-07-08 NOTE — Addendum Note (Signed)
Addended by: Truddie Crumble on: 07/08/2016 01:44 PM   Modules accepted: Orders

## 2016-07-08 NOTE — Telephone Encounter (Signed)
Stat Dilantin level drawn today - <2.5- called to Dr Marlowe Sax. Stated she will call and talk to pt.

## 2016-07-08 NOTE — Addendum Note (Signed)
Addended by: Truddie Crumble on: 07/08/2016 01:43 PM   Modules accepted: Orders

## 2016-07-22 NOTE — Telephone Encounter (Signed)
REVIEW FOR MEDICATIONS

## 2016-07-25 ENCOUNTER — Encounter: Payer: Self-pay | Admitting: *Deleted

## 2016-08-09 ENCOUNTER — Encounter: Payer: Self-pay | Admitting: Internal Medicine

## 2016-08-16 ENCOUNTER — Ambulatory Visit (INDEPENDENT_AMBULATORY_CARE_PROVIDER_SITE_OTHER): Payer: Self-pay | Admitting: Internal Medicine

## 2016-08-16 ENCOUNTER — Encounter: Payer: Self-pay | Admitting: Internal Medicine

## 2016-08-16 VITALS — BP 158/99 | HR 90 | Temp 98.5°F | Ht 66.0 in | Wt 123.3 lb

## 2016-08-16 DIAGNOSIS — Z79899 Other long term (current) drug therapy: Secondary | ICD-10-CM

## 2016-08-16 DIAGNOSIS — Z720 Tobacco use: Secondary | ICD-10-CM

## 2016-08-16 DIAGNOSIS — R000 Tachycardia, unspecified: Secondary | ICD-10-CM

## 2016-08-16 DIAGNOSIS — F1099 Alcohol use, unspecified with unspecified alcohol-induced disorder: Secondary | ICD-10-CM

## 2016-08-16 DIAGNOSIS — IMO0002 Reserved for concepts with insufficient information to code with codable children: Secondary | ICD-10-CM

## 2016-08-16 DIAGNOSIS — I10 Essential (primary) hypertension: Secondary | ICD-10-CM

## 2016-08-16 DIAGNOSIS — F17210 Nicotine dependence, cigarettes, uncomplicated: Secondary | ICD-10-CM

## 2016-08-16 MED ORDER — DILTIAZEM HCL ER COATED BEADS 180 MG PO CP24: 180.0000 mg | ORAL_CAPSULE | Freq: Every day | ORAL | 0 refills | Status: DC

## 2016-08-16 NOTE — Patient Instructions (Addendum)
Dose of Cardizem CD has been increased: take 180 mg once daily   Continue taking Naltrexone  Return for a follow-up visit in 1 month.

## 2016-08-17 NOTE — Assessment & Plan Note (Signed)
Assessment Patient states he was previously smoking one half pack per day but has now cut down to 5 cigarettes per day. Discussed smoking cessation at this visit and he continues to be in the pre-contemplative stage at this time.  Plan -Discuss again at next visit

## 2016-08-17 NOTE — Progress Notes (Signed)
   CC: Patient is here to discuss hypertension, alcohol use disorder, and tobacco use. Chronic tachycardia was also discussed during this visit.  HPI:  [name] is a 51 y.o. male with a past medical history of conditions listed below presenting to the clinic to discuss hypertension, alcohol use disorder, and tobacco use. Chronic tachycardia was also discussed this visit. Please see problem based charting for the status of the patient's current and chronic medical conditions.   Past Medical History:  Diagnosis Date  . Alcohol abuse   . Alcohol related seizure ([place])   . Allergy   . Hiccups   . HTN (hypertension)   . Hyperlipidemia    Review of Systems: Pertinent positives mentioned in HPI. Remainder of all ROS negative.   Physical Exam:  Vitals:   08/16/16 1511 08/16/16 1546  BP: (!) 143/88 (!) 158/99  Pulse: 94 90  Temp: 98.5 F (36.9 C)   TempSrc: Oral   SpO2: 100%   Weight: 123 lb 4.8 oz (55.9 kg)   Height: 5\' 6"  (1.676 m)    Physical Exam  Constitutional: He is oriented to person, place, and time. He appears well-developed and well-nourished. No distress.  HENT:  Head: Normocephalic and atraumatic.  Eyes: Right eye exhibits no discharge. Left eye exhibits no discharge.  Cardiovascular: Regular rhythm and intact distal pulses.   Tachycardic  Pulmonary/Chest: Effort normal and breath sounds normal. No respiratory distress. He has no wheezes. He has no rales.  Abdominal: Soft. Bowel sounds are normal. He exhibits no distension. There is no tenderness.  Musculoskeletal: He exhibits no edema.  Neurological: He is alert and oriented to person, place, and time.  Skin: Skin is warm and dry.    Assessment & Plan:   See Encounters Tab for problem based charting.  Patient discussed with Dr. Evette Doffing

## 2016-08-17 NOTE — Assessment & Plan Note (Signed)
BP Readings from Last 3 Encounters:  08/16/16 (!) 158/99  07/05/16 118/75  06/14/16 (!) 156/97    Lab Results  Component Value Date   NA 140 07/05/2016   K 4.9 07/05/2016   CREATININE 1.02 07/05/2016    Assessment: Blood pressure control:  above goal Comments: Initial blood pressure at this visit 143/88; repeat 158/99. Patient is currently taking Cardizem CD 120 mg daily and reports compliance.  Plan: Medications: Increase dose of Cardizem CD to 180 mg daily. Educational resources provided: brochure (denies need ) Other plans: Return to clinic in 1 month for blood pressure recheck.

## 2016-08-17 NOTE — Assessment & Plan Note (Signed)
Assessment Pulse in the 90s at this visit. Patient is asymptomatic-no chest pain or dyspnea. EKG during prior visit showing sinus tachycardia with Q waves in inferior leads and T-wave inversions in lateral leads. He was referred to cardiology and states he has an appointment with them on July 6.  Plan -Encouraged him to follow-up with cardiology

## 2016-08-17 NOTE — Assessment & Plan Note (Signed)
Assessment Current medication regimen includes naltrexone 50 mg daily. Patient states he was taking this medication regularly previously but has not taken it for the past 3-4 days. Currently drinking a sixpack of beer per week. He appeared better at this visit-no hiccups or tremors.  Plan -Continue naltrexone

## 2016-08-18 NOTE — Progress Notes (Signed)
Internal Medicine Clinic Attending  Case discussed with Dr. Rathoreat the time of the visit. We reviewed the resident's history and exam and pertinent patient test results. I agree with the assessment, diagnosis, and plan of care documented in the resident's note.  

## 2016-08-19 ENCOUNTER — Ambulatory Visit: Payer: Self-pay | Admitting: Internal Medicine

## 2016-09-22 ENCOUNTER — Encounter: Payer: Self-pay | Admitting: Internal Medicine

## 2016-09-22 ENCOUNTER — Ambulatory Visit (INDEPENDENT_AMBULATORY_CARE_PROVIDER_SITE_OTHER): Payer: Self-pay | Admitting: Internal Medicine

## 2016-09-22 ENCOUNTER — Telehealth: Payer: Self-pay | Admitting: *Deleted

## 2016-09-22 VITALS — BP 170/96 | HR 103 | Temp 98.5°F | Ht 66.0 in | Wt 119.8 lb

## 2016-09-22 DIAGNOSIS — IMO0002 Reserved for concepts with insufficient information to code with codable children: Secondary | ICD-10-CM

## 2016-09-22 DIAGNOSIS — E781 Pure hyperglyceridemia: Secondary | ICD-10-CM

## 2016-09-22 DIAGNOSIS — F1090 Alcohol use, unspecified, uncomplicated: Secondary | ICD-10-CM

## 2016-09-22 DIAGNOSIS — F10239 Alcohol dependence with withdrawal, unspecified: Secondary | ICD-10-CM

## 2016-09-22 DIAGNOSIS — R569 Unspecified convulsions: Secondary | ICD-10-CM

## 2016-09-22 DIAGNOSIS — F10939 Alcohol use, unspecified with withdrawal, unspecified: Secondary | ICD-10-CM

## 2016-09-22 DIAGNOSIS — I10 Essential (primary) hypertension: Secondary | ICD-10-CM

## 2016-09-22 MED FILL — CARTIA XT 180 MG CAPSULE SA: 180 | 30 days supply | Qty: 30 | Fill #0

## 2016-09-22 MED FILL — ATORVASTATIN 20 MG TABLET: 20 | 30 days supply | Qty: 30 | Fill #1

## 2016-09-22 MED FILL — FOLIC ACID 1 MG TABLET: 1 | 30 days supply | Qty: 30 | Fill #1

## 2016-09-22 MED FILL — FENOFIBRATE 145 MG TAB: 145 | 30 days supply | Qty: 30 | Fill #1

## 2016-09-22 MED FILL — NALTREXONE 50 MG TABLET: 50 | 30 days supply | Qty: 30 | Fill #1

## 2016-09-22 MED FILL — PHENYTOIN SOD EXT 300 MG CA: 300 | 30 days supply | Qty: 30 | Fill #2

## 2016-09-22 NOTE — Assessment & Plan Note (Signed)
Assessment Patient ran out of phenytoin 3 days ago. Denies having any seizures.  Plan -Continue phenytoin -Dr. Maudie Mercury spoke to the staff that the [facility] outpatient pharmacy. They have agreed to refill patient's medications for a 1 month supply under the hope fund. Dr. Maudie Mercury is also be working toward enrolling the patient for the [facility] med assist program. Greatly appreciate her assistance.

## 2016-09-22 NOTE — Telephone Encounter (Signed)
resolved 

## 2016-09-22 NOTE — Assessment & Plan Note (Signed)
Assessment Patient ran out of his statin and fenofibrate.  Plan -Continue statin -Continue fenofibrate -Recheck lipid panel at next visit -Dr. Maudie Mercury spoke to the staff that the [facility] outpatient pharmacy. They have agreed to refill patient's medications for a 1 month supply under the hope fund. Dr. Maudie Mercury is also be working toward enrolling the patient for the [facility] med assist program. Greatly appreciate her assistance.

## 2016-09-22 NOTE — Progress Notes (Signed)
   CC: Patient is here because he ran out of his medications and does not have money to pay for them.  HPI:  Joseph Underwood is a 51 y.o. male with a past medical history of conditions listed below presenting to the clinic as he ran out of his medications and does not have money to pay for them. Hypertension, alcohol withdrawal seizures, alcohol use disorder, and hyperlipidemia were discussed during this visit. Please see problem based charting for the status of the patient's current and chronic medical conditions.   Past Medical History:  Diagnosis Date  . Alcohol abuse   . Alcohol related seizure ([place])   . Allergy   . Hiccups   . HTN (hypertension)   . Hyperlipidemia    Review of Systems: Pertinent positives mentioned in HPI. Remainder of all ROS negative.   Physical Exam:  Vitals:   09/22/16 1049  BP: (!) 170/96  Pulse: (!) 103  Temp: 98.5 F (36.9 C)  TempSrc: Oral  SpO2: 99%  Weight: 119 lb 12.8 oz (54.3 kg)  Height: 5\' 6"  (1.676 m)   Physical Exam  Constitutional: He is oriented to person, place, and time. He appears well-developed and well-nourished.  Noted to have hand tremors.  HENT:  Head: Normocephalic and atraumatic.  Mouth/Throat: Oropharynx is clear and moist.  Eyes: Right eye exhibits no discharge. Left eye exhibits no discharge.  Cardiovascular: Regular rhythm and intact distal pulses.  Exam reveals no friction rub.   Tachycardic  Pulmonary/Chest: Effort normal and breath sounds normal. No respiratory distress. He has no wheezes. He has no rales.  Abdominal: Soft. Bowel sounds are normal. He exhibits no distension. There is no tenderness.  Musculoskeletal: He exhibits no edema.  Neurological: He is alert and oriented to person, place, and time. No cranial nerve deficit.  Strength 5 out of 5 and sensation intact to light touch in bilateral upper and lower extremities.  Skin: Skin is warm and dry.    Assessment & Plan:   See Encounters Tab for problem  based charting.  Patient discussed with Dr. Daryll Drown

## 2016-09-22 NOTE — Assessment & Plan Note (Addendum)
Assessment Uncontrolled hypertension. Patient ran out of Cardizem a week ago and does not have money to pay for refill. He has been receiving this medicine via the $4 list at [facility]. Initial blood pressure 189/101 and repeat 170/96 at this visit. Patient continues to be tachycardic with pulse in the low 100s. Reports having intermittent headaches in the right frontoparietal region for the past 3 days. Denies having any nausea, vomiting, vision changes, or focal weakness/numbness. Denies any chest pain, shortness of breath, or abdominal pain. Neuro exam nonfocal.  Plan -Continue Cardizem CD 180 mg daily -Tylenol as needed for headaches. Received tablets from the clinic. -Return to the clinic in 2 weeks for blood pressure recheck -Dr. Maudie Mercury spoke to the staff that the [facility] outpatient pharmacy. They have agreed to refill patient's medications for a 1 month supply under the hope fund. Dr. Maudie Mercury is also be working toward enrolling the patient for the [facility] med assist program. Greatly appreciate her assistance.

## 2016-09-22 NOTE — Patient Instructions (Signed)
Mr. Martinique it was nice seeing you today.  -We spoke to the [facility]. They will give you free medications for a 1 month supply. Our pharmacist Dr. Maudie Mercury is also working on getting you enrolled in a medication assistance program.  -Continue taking your medications as prescribed. Do not miss any doses.  -Take Tylenol as needed for headaches  -Please seek immediate medical attention if your headaches become worse or you start experiencing any numbness/ weakness in your body or changes in your vision.

## 2016-09-22 NOTE — Telephone Encounter (Signed)
Pt called c/o being told his meds were free and then they weren't please call him asap

## 2016-09-22 NOTE — Assessment & Plan Note (Addendum)
Assessment Patient ran out of naltrexone. He is now drinking two 12 ounce beers 3-4 times a week. Noted to have hand tremors.  Plan -Continue naltrexone -Continue magnesium supplement -Continue folic acid -Dr. Maudie Mercury spoke to the staff that the [facility] outpatient pharmacy. They have agreed to refill patient's medications for a 1 month supply under the hope fund. Dr. Maudie Mercury is also be working toward enrolling the patient for the [facility] med assist program. Greatly appreciate her assistance.

## 2016-09-23 NOTE — Progress Notes (Signed)
Internal Medicine Clinic Attending  Case discussed with Dr. Rathoreat the time of the visit. We reviewed the resident's history and exam and pertinent patient test results. I agree with the assessment, diagnosis, and plan of care documented in the resident's note.  

## 2016-09-30 ENCOUNTER — Ambulatory Visit: Payer: Self-pay | Admitting: Physician Assistant

## 2016-09-30 ENCOUNTER — Ambulatory Visit: Payer: Self-pay | Admitting: Internal Medicine

## 2016-09-30 NOTE — Progress Notes (Deleted)
Cardiology Office Note:    Date:  09/30/2016   ID:  [name], DOB 05/26/1965, MRN [id number]  PCP:  Shela Leff, MD  Cardiologist:  Marland Kitchen  Referring MD: Shela Leff, MD   No chief complaint on file. ***  History of Present Illness:    Joseph Underwood is a 51 y.o. male with a hx of HTN, HL, alcohol abuse, alcohol related seizures, prior subdural hematoma, tobacco abuse who is being seen today for the evaluation of tachycardia at the request of Shela Leff, MD.   [name] was evaluated by Dr. Loralie Champagne in 2012 during an admission for recurrent seizure with assoc chest pain.  He has early repolarization on his ECG and a code STEMI was called at first but then canceled.  He ruled out for MI.  An echocardiogram in 2013 demonstrated mod LVH and normal ejection fraction.  ***  No flowsheet data found.  Prior CV studies:   The following studies were reviewed today:  Echo 07/2011 Moderate LVH, EF 55-60, normal wall motion, normal diastolic function   Past Medical History:  Diagnosis Date  . Alcohol abuse   . Alcohol related seizure ([place])   . Allergy   . Hiccups   . HTN (hypertension)   . Hyperlipidemia     Past Surgical History:  Procedure Laterality Date  . NO PAST SURGERIES      Current Medications: No outpatient prescriptions have been marked as taking for the 09/30/16 encounter (Appointment) with Richardson Dopp T, PA-C.     Allergies:   Patient has no known allergies.   Social History   Social History  . Marital status: Single    Spouse name: N/A  . Number of children: N/A  . Years of education: N/A   Social History Main Topics  . Smoking status: Current Every Day Smoker    Packs/day: 0.50    Years: 37.00    Types: Cigarettes  . Smokeless tobacco: Never Used     Comment: 3 cigs per day  . Alcohol use 52.8 oz/week    14 Cans of beer, 74 Shots of liquor per week     Comment: 12/16/2013 "~ 1 pint of liquor per day"  . Drug use:  No  . Sexual activity: No   Other Topics Concern  . Not on file   Social History Narrative  . No narrative on file     Family Hx: The patient's family history includes Hypertension in his mother. There is no history of Colon cancer, Esophageal cancer, Rectal cancer, or Stomach cancer.  ROS:   Please see the history of present illness.    ROS All other systems reviewed and are negative.   EKGs/Labs/Other Test Reviewed:    EKG:  EKG is *** ordered today.  The ekg ordered today demonstrates ***  ECG from primary care 07/05/16 - Personally reviewed:  Sinus tachycardia, HR 102, normal axis, biatrial enlargement, poor R-wave progression, J-point elevation, T-wave inversions in V4-V6, QTc 466 (? Borderline LVH)  Recent Labs: 12/29/2015: Magnesium 1.6 07/05/2016: ALT 19; BUN 13; Creatinine, Ser 1.02; Hemoglobin 13.7; Platelets 255; Potassium 4.9; Sodium 140   Recent Lipid Panel Lab Results  Component Value Date/Time   CHOL 274 (H) 12/29/2015 04:52 PM   TRIG 175 (H) 12/29/2015 04:52 PM   HDL 88 12/29/2015 04:52 PM   CHOLHDL 3.1 12/29/2015 04:52 PM   CHOLHDL 6.5 03/28/2014 09:12 PM   LDLCALC 151 (H) 12/29/2015 04:52 PM   LDLDIRECT  130 (H) 03/29/2014 06:00 AM    Physical Exam:    VS:  There were no vitals taken for this visit.    Wt Readings from Last 3 Encounters:  09/22/16 119 lb 12.8 oz (54.3 kg)  08/16/16 123 lb 4.8 oz (55.9 kg)  07/05/16 126 lb 6.4 oz (57.3 kg)     ***Physical Exam  ASSESSMENT:    No diagnosis found. PLAN:    In order of problems listed above:  No diagnosis found.***  Dispo:  No Follow-up on file.   Medication Adjustments/Labs and Tests Ordered: Current medicines are reviewed at length with the patient today.  Concerns regarding medicines are outlined above.  Orders/Tests:  No orders of the defined types were placed in this encounter.  Medication changes: No orders of the defined types were placed in this encounter.  Signed, Richardson Dopp, PA-C  09/30/2016 8:04 AM    [facility] [address], [place], [place]  [address] Phone: [phone]; Fax: [phone]

## 2016-10-04 NOTE — Addendum Note (Signed)
Addended by: Hulan Fray on: 10/04/2016 07:06 PM   Modules accepted: Orders

## 2016-10-12 ENCOUNTER — Ambulatory Visit: Payer: Self-pay

## 2016-10-19 ENCOUNTER — Ambulatory Visit (INDEPENDENT_AMBULATORY_CARE_PROVIDER_SITE_OTHER): Payer: Self-pay | Admitting: Internal Medicine

## 2016-10-19 ENCOUNTER — Encounter: Payer: Self-pay | Admitting: Internal Medicine

## 2016-10-19 DIAGNOSIS — I10 Essential (primary) hypertension: Secondary | ICD-10-CM

## 2016-10-19 MED ORDER — DILTIAZEM HCL ER COATED BEADS 240 MG PO CP24: 240.0000 mg | ORAL_CAPSULE | Freq: Every day | ORAL | 0 refills | Status: DC

## 2016-10-19 MED ORDER — MAGNESIUM CHLORIDE 64 MG PO TBEC: 2.0000 | DELAYED_RELEASE_TABLET | Freq: Every day | ORAL | 0 refills | Status: DC

## 2016-10-19 NOTE — Assessment & Plan Note (Signed)
Patient is here for follow up from last month for his hypertension BP Readings from Last 3 Encounters:  10/19/16 (!) 145/75  09/22/16 (!) 170/96  08/16/16 (!) 158/99   Patient is still slightly hypertensive today on the cardiazem 180 CD. He reports compliance to the cardiazem and says he has 2 more weeks of the supply.  He does have chronic sinus tachycardia and HR was 100 today.  He says that he is working on the [employer] paperwork that Dr Yong Channel.  I explained to him that he needs to promptly finish this as we are not sure how long he may be able to get free cost medicine through the outpt pharmacy.  Plan -Increased cardiazem to 240 mg- sent new prescription to outpt pharmacy -follow up next month

## 2016-10-19 NOTE — Progress Notes (Signed)
    CC: HTN  HPI: [name] is a 51 y.o. man with PMH noted below here for hypertension follow up  Please see Problem List/A&P for the status of the patient's chronic medical problems   Past Medical History:  Diagnosis Date  . Alcohol abuse   . Alcohol related seizure ([place])   . Allergy   . Hiccups   . HTN (hypertension)   . Hyperlipidemia     Review of Systems:  Constitutional: Negative for fever, chills, weight loss and malaise/fatigue.  HEENT: No headaches Respiratory: Negative for cough, shortness of breath and wheezing.  Cardiovascular: Negative for chest pain, orthopnea, or PND    Physical Exam: Vitals:   10/19/16 1433  BP: (!) 145/75  Pulse: 100  Temp: 98.5 F (36.9 C)  TempSrc: Oral  SpO2: 100%  Weight: 118 lb 1.6 oz (53.6 kg)  Height: 5\' 6"  (1.676 m)    General: A&O, in NAD Neck: supple, midline trachea, CV: RRR, normal s1, s2, no m/r/g,  Resp: equal and symmetric breath sounds, no wheezing or crackles  Abdomen: soft, nontender, nondistended, +BS   Assessment & Plan:   See encounters tab for problem based medical decision making. Patient discussed with Dr. Dareen Piano

## 2016-10-19 NOTE — Patient Instructions (Signed)
Thank you for your visit today  We have changed your dose of the blood pressure medicine (increased)- please pick it up from the pharmacy.  Please follow up with your PCP

## 2016-10-21 ENCOUNTER — Encounter: Payer: Self-pay | Admitting: Physician Assistant

## 2016-10-21 NOTE — Progress Notes (Signed)
Internal Medicine Clinic Attending  Case discussed with Dr. Saraiya at the time of the visit.  We reviewed the resident's history and exam and pertinent patient test results.  I agree with the assessment, diagnosis, and plan of care documented in the resident's note.  

## 2016-10-31 ENCOUNTER — Encounter: Payer: Self-pay | Admitting: Physician Assistant

## 2016-11-01 ENCOUNTER — Encounter: Payer: Self-pay | Admitting: Physician Assistant

## 2016-11-01 ENCOUNTER — Other Ambulatory Visit: Payer: Self-pay | Admitting: Pharmacist

## 2016-11-01 DIAGNOSIS — F1029 Alcohol dependence with unspecified alcohol-induced disorder: Secondary | ICD-10-CM

## 2016-11-01 DIAGNOSIS — E781 Pure hyperglyceridemia: Secondary | ICD-10-CM

## 2016-11-01 DIAGNOSIS — R569 Unspecified convulsions: Secondary | ICD-10-CM

## 2016-11-01 MED ORDER — FOLIC ACID 1 MG PO TABS: 1.0000 mg | ORAL_TABLET | Freq: Every day | ORAL | 3 refills | Status: DC

## 2016-11-01 MED ORDER — PHENYTOIN SODIUM EXTENDED 300 MG PO CAPS: 300.0000 mg | ORAL_CAPSULE | Freq: Every day | ORAL | 3 refills | Status: DC

## 2016-11-01 MED ORDER — ATORVASTATIN CALCIUM 20 MG PO TABS: 20.0000 mg | ORAL_TABLET | Freq: Every day | ORAL | 1 refills | Status: DC

## 2016-11-01 NOTE — Progress Notes (Signed)
Patient was approved for [facility] [employer]. Prescriptions were sent for atorvastatin, folic acid, and phenytoin. I referred patient to  outpatient or other local pharmacy for continued access to diltiazem, fenofibrate, magnesium chloride, and naltrexone due to not being listed on [facility] medassist pharmacy formulary.

## 2017-01-17 ENCOUNTER — Ambulatory Visit: Payer: Self-pay

## 2017-04-25 ENCOUNTER — Ambulatory Visit (INDEPENDENT_AMBULATORY_CARE_PROVIDER_SITE_OTHER): Payer: Self-pay | Admitting: Internal Medicine

## 2017-04-25 ENCOUNTER — Other Ambulatory Visit: Payer: Self-pay

## 2017-04-25 ENCOUNTER — Encounter: Payer: Self-pay | Admitting: Internal Medicine

## 2017-04-25 VITALS — BP 133/72 | HR 113 | Temp 98.0°F | Ht 66.0 in | Wt 123.2 lb

## 2017-04-25 DIAGNOSIS — Z8719 Personal history of other diseases of the digestive system: Secondary | ICD-10-CM

## 2017-04-25 DIAGNOSIS — Z79899 Other long term (current) drug therapy: Secondary | ICD-10-CM

## 2017-04-25 DIAGNOSIS — R000 Tachycardia, unspecified: Secondary | ICD-10-CM

## 2017-04-25 DIAGNOSIS — F10239 Alcohol dependence with withdrawal, unspecified: Secondary | ICD-10-CM

## 2017-04-25 DIAGNOSIS — R10811 Right upper quadrant abdominal tenderness: Secondary | ICD-10-CM

## 2017-04-25 DIAGNOSIS — Z23 Encounter for immunization: Secondary | ICD-10-CM

## 2017-04-25 DIAGNOSIS — K7030 Alcoholic cirrhosis of liver without ascites: Secondary | ICD-10-CM

## 2017-04-25 DIAGNOSIS — G40509 Epileptic seizures related to external causes, not intractable, without status epilepticus: Secondary | ICD-10-CM

## 2017-04-25 DIAGNOSIS — F10939 Alcohol use, unspecified with withdrawal, unspecified: Secondary | ICD-10-CM

## 2017-04-25 DIAGNOSIS — E781 Pure hyperglyceridemia: Secondary | ICD-10-CM

## 2017-04-25 DIAGNOSIS — F1020 Alcohol dependence, uncomplicated: Secondary | ICD-10-CM

## 2017-04-25 DIAGNOSIS — Z720 Tobacco use: Secondary | ICD-10-CM

## 2017-04-25 DIAGNOSIS — Z87311 Personal history of (healed) other pathological fracture: Secondary | ICD-10-CM

## 2017-04-25 DIAGNOSIS — R569 Unspecified convulsions: Secondary | ICD-10-CM

## 2017-04-25 DIAGNOSIS — R0781 Pleurodynia: Secondary | ICD-10-CM

## 2017-04-25 DIAGNOSIS — Z9114 Patient's other noncompliance with medication regimen: Secondary | ICD-10-CM

## 2017-04-25 DIAGNOSIS — I10 Essential (primary) hypertension: Secondary | ICD-10-CM

## 2017-04-25 DIAGNOSIS — F10288 Alcohol dependence with other alcohol-induced disorder: Secondary | ICD-10-CM

## 2017-04-25 DIAGNOSIS — Z0000 Encounter for general adult medical examination without abnormal findings: Secondary | ICD-10-CM

## 2017-04-25 MED ORDER — FENOFIBRATE 145 MG PO TABS: 145.0000 mg | ORAL_TABLET | Freq: Every day | ORAL | 1 refills | Status: DC

## 2017-04-25 MED ORDER — DILTIAZEM HCL ER COATED BEADS 120 MG PO CP24: 120.0000 mg | ORAL_CAPSULE | Freq: Every day | ORAL | 0 refills | Status: DC

## 2017-04-25 MED ORDER — ATORVASTATIN CALCIUM 20 MG PO TABS: 20.0000 mg | ORAL_TABLET | Freq: Every day | ORAL | 1 refills | Status: DC

## 2017-04-25 MED FILL — CARTIA XT 120 MG CAPSULE SA: 120 | 30 days supply | Qty: 30 | Fill #0

## 2017-04-25 MED FILL — ATORVASTATIN 20 MG TABLET: 20 | 30 days supply | Qty: 30 | Fill #0

## 2017-04-25 MED FILL — FENOFIBRATE 145 MG TAB: 145 | 30 days supply | Qty: 30 | Fill #0

## 2017-04-25 NOTE — Patient Instructions (Signed)
[name] it was nice seeing you today.  -Continue taking medications as instructed.  -Our office will get you schedule for a visit with cardiology.  -Please return for a follow-up in 1 month.

## 2017-04-26 LAB — HEPATIC FUNCTION PANEL
ALBUMIN: 4.7 g/dL (ref 3.5–5.5)
ALT: 25 IU/L (ref 0–44)
AST: 56 IU/L — ABNORMAL HIGH (ref 0–40)
Alkaline Phosphatase: 76 IU/L (ref 39–117)
BILIRUBIN TOTAL: 0.5 mg/dL (ref 0.0–1.2)
Bilirubin, Direct: 0.13 mg/dL (ref 0.00–0.40)
TOTAL PROTEIN: 7.4 g/dL (ref 6.0–8.5)

## 2017-04-27 ENCOUNTER — Ambulatory Visit: Payer: Self-pay

## 2017-04-27 NOTE — Assessment & Plan Note (Signed)
Blood pressure currently well controlled.  Patient is not taking any medications.  He was previously on Cardizem 240 mg daily but has not been taking this medication.  Plan -Advised him to take Cardizem 120 mg daily due to his chronic tachycardia which is likely related to ethanol use

## 2017-04-27 NOTE — Assessment & Plan Note (Signed)
Patient has not been taking his statin and fenofibrate.  He did not have any explanation for why he is not taking his medications but did express interest in restarting of his medications.  Plan -Continue statin and fenofibrate -Repeat lipid panel at follow-up visit in 1 month

## 2017-04-27 NOTE — Assessment & Plan Note (Signed)
Influenza vaccine administered at this visit. 

## 2017-04-27 NOTE — Assessment & Plan Note (Addendum)
Patient is currently drinking 1 pint of liquor and a 6 pack of beer in a week.  States 2 days prior to this appointment he had 3 shots of liquor and a day prior he had beer.  He continues to feel shaky, has hiccups, stumbles, and has frequent falls.  He stopped taking naltrexone after his previous visit in August 2018.  States cost is not a barrier because he receives all of his medications for free through a mail order pharmacy.  I had a discussion with the patient about his alcohol dependence.  He expressed interest in taking naltrexone again.  Plan -Advised him to start taking Naltrexone 50 mg daily -1 month follow-up

## 2017-04-27 NOTE — Assessment & Plan Note (Signed)
He continues to have seizures in the setting of phenytoin noncompliance.  His last seizure was 2 months ago. Cost of the medication is not a barrier as he receives all of his medications for free.  Plan -Continue phenytoin -Emphasized the importance of medication compliance -Check phenytoin level at follow-up visit in 1 month

## 2017-04-27 NOTE — Assessment & Plan Note (Addendum)
Patient continues to drink alcohol and is again tachycardic at this visit.  EKG done during previous visit showed sinus tachycardia with Q waves in the inferior leads and T wave inversions in lateral leads.  He was referred to cardiology at that time.  States he missed his cardiology appointment.  He was previously on Cardizem but has not been taking it.  He denies having any chest pain but does report having dyspnea with exertion.  Plan -Advised him to make an appointment with cardiology for ischemic workup.  Our office will help him reschedule his appointment.  -Cardizem 120 mg daily.

## 2017-04-27 NOTE — Progress Notes (Signed)
   CC: Pain in the rib area on the right.  HPI:  [name] is a 52 y.o. male with a past medical history of conditions listed below presenting to the clinic complaining of pain in the rib area on the right. Hypertension, alcohol use disorder, alcohol withdrawal seizures, tobacco use, and tachycardia were also discussed. Please see problem based charting for the status of the patient's current and chronic medical conditions.   Past Medical History:  Diagnosis Date  . Alcohol abuse   . Alcohol related seizure ([place])   . Allergy   . Hiccups   . HTN (hypertension)   . Hyperlipidemia    Review of Systems: Pertinent positives mentioned in HPI. Remainder of all ROS negative.   Physical Exam:  Vitals:   04/25/17 1353  BP: 133/72  Pulse: (!) 113  Temp: 98 F (36.7 C)  TempSrc: Oral  SpO2: 96%  Weight: 123 lb 3.2 oz (55.9 kg)  Height: 5\' 6"  (1.676 m)   Physical Exam  Constitutional: He is oriented to person, place, and time. He appears well-developed and well-nourished. No distress.  HENT:  Head: Normocephalic and atraumatic.  Mouth/Throat: Oropharynx is clear and moist.  Eyes: Right eye exhibits no discharge. Left eye exhibits no discharge.  Cardiovascular: Normal rate, regular rhythm and intact distal pulses.  Pulmonary/Chest: Effort normal and breath sounds normal. No respiratory distress. He has no wheezes. He has no rales.  Abdominal: Soft. Bowel sounds are normal. He exhibits no distension. There is tenderness. There is guarding. There is no rebound.  Right upper quadrant tender to palpation.  Musculoskeletal: He exhibits no edema or deformity.  Ribs non-tender to palpation.  Neurological: He is alert and oriented to person, place, and time.  Skin: Skin is warm and dry.  Psychiatric: He has a normal mood and affect. His behavior is normal.    Assessment & Plan:   See Encounters Tab for problem based charting.  Patient discussed with Dr. Rebeca Alert

## 2017-04-27 NOTE — Assessment & Plan Note (Addendum)
Patient is complaining of pain in the rib area on the right.  States the pain has been present for several years.  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  Denies having any numbness, burning, or tingling sensation.  Does report having history of right-sided rib fracture about 12-13 years ago.  Also reports noticing a "knot" in the area which moves.  He denies having any fevers, chills, chest pain, cough, or shortness of breath.  States he tries Aleve on occasion but it does not help with the pain.  He does not believe the pain is related to eating.  Denies having any nausea or vomiting.  On exam, ribs on the right side nontender to palpation.  He did have tenderness on palpation of his right upper quadrant of the abdomen.  No mass palpated in the area.  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  Prior ultrasound done in July 2017 did not show gallstones.  Hep C testing has been negative in the past.  LFTs last checked in May 2018 were normal.  LFTs ordered at this visit showing mild elevation in AST (56).  Plan -Right upper quadrant ultrasound has been ordered

## 2017-04-27 NOTE — Progress Notes (Signed)
Internal Medicine Clinic Attending  Case discussed with Dr. Marlowe Sax  at the time of the visit.  We reviewed the resident's history and exam and pertinent patient test results.  I agree with the assessment, diagnosis, and plan of care documented in the resident's note.  Discussed with Dr. Marlowe Sax, he clearly is very reluctant to take his medicines. Will need to spend time over multiple visits to try to understand his perspective better and work on joint decision-making with him to determine a medication regimen that addresses his major medical problems and his primary concerns.   Oda Kilts, MD

## 2017-04-27 NOTE — Assessment & Plan Note (Signed)
States he was previously smoking 1/2 pack/day but has now cut down to 1 cigarette/day.  Plan -Congratulated patient and encouraged him to continue working toward complete cessation of tobacco use.

## 2017-05-05 ENCOUNTER — Ambulatory Visit: Payer: Self-pay

## 2017-05-16 ENCOUNTER — Ambulatory Visit (HOSPITAL_COMMUNITY): Admission: RE | Admit: 2017-05-16 | Payer: Self-pay | Source: Ambulatory Visit

## 2017-05-23 ENCOUNTER — Ambulatory Visit (INDEPENDENT_AMBULATORY_CARE_PROVIDER_SITE_OTHER): Payer: Self-pay | Admitting: Internal Medicine

## 2017-05-23 ENCOUNTER — Encounter: Payer: Self-pay | Admitting: Internal Medicine

## 2017-05-23 DIAGNOSIS — F10239 Alcohol dependence with withdrawal, unspecified: Secondary | ICD-10-CM

## 2017-05-23 DIAGNOSIS — G40509 Epileptic seizures related to external causes, not intractable, without status epilepticus: Secondary | ICD-10-CM

## 2017-05-23 DIAGNOSIS — F1020 Alcohol dependence, uncomplicated: Secondary | ICD-10-CM

## 2017-05-23 DIAGNOSIS — Z79899 Other long term (current) drug therapy: Secondary | ICD-10-CM

## 2017-05-23 DIAGNOSIS — R278 Other lack of coordination: Secondary | ICD-10-CM

## 2017-05-23 DIAGNOSIS — F10288 Alcohol dependence with other alcohol-induced disorder: Secondary | ICD-10-CM

## 2017-05-23 DIAGNOSIS — Z9181 History of falling: Secondary | ICD-10-CM

## 2017-05-23 DIAGNOSIS — E781 Pure hyperglyceridemia: Secondary | ICD-10-CM

## 2017-05-23 DIAGNOSIS — M25512 Pain in left shoulder: Secondary | ICD-10-CM

## 2017-05-23 DIAGNOSIS — R569 Unspecified convulsions: Principal | ICD-10-CM

## 2017-05-23 NOTE — Assessment & Plan Note (Signed)
He reports left shoulder pain x 1 day.  No precipitating trauma or injury.  He does not recall any fall on to his shoulder and does not sleep on his left side.  He reports some decreased ROM with flexion and abduction.  There is no deformity or joint tenderness.  Strength is intact and 5/5 bilaterally.  He did have some posterior discomfort with strength testing but otherwise exam was unremarkable.  Suspect possible muscle strain or tendinopathy but given the duration of symptoms have just recommended conservative care for now with activity modifcation and ice/heat as tolerated.  Advised against NSAIDs given his EtOH use and risk for PUD.  Plan: - Conservative care as above - RTC 1-2 weeks if not improving for further evaluation.

## 2017-05-23 NOTE — Patient Instructions (Signed)
FOLLOW-UP INSTRUCTIONS When: 3 months with Dr Marlowe Sax or other provider For: lipids, alcohol use disorder What to bring: current medications  I encourage you to take your medications daily as prescribed to help control your seizures, alcohol intake, and cholesterol levels.  For your shoulder, try to modify your activities for the next few days, rest as needed, and use ice and a heating pad if needed.

## 2017-05-23 NOTE — Progress Notes (Signed)
CC: here for f/u of lipids, EtOH use disorder, and complaint of left shoulder pain  HPI:  Joseph Underwood is a 52 y.o. man with a past medical history listed below here today for follow up of his f/u of his lipids, EtOH disorder.  He also has complaint of left shoulder pain x 1 day.   For details of today's visit and the status of his chronic medical issues please refer to the assessment and plan.   Past Medical History:  Diagnosis Date  . Alcohol abuse   . Alcohol related seizure ([place])   . Allergy   . Hiccups   . HTN (hypertension)   . Hyperlipidemia    Review of Systems:  Review of Systems  Constitutional: Negative for chills and fever.  Respiratory: Negative for cough and shortness of breath.   Cardiovascular: Negative for chest pain.  Gastrointestinal: Negative for abdominal pain, constipation, diarrhea, nausea and vomiting.  Genitourinary: Negative for dysuria, frequency and urgency.  Musculoskeletal: Positive for falls and joint pain.  Psychiatric/Behavioral:       Continued EtOH use.      Physical Exam:  Vitals:   05/23/17 1506  BP: 118/77  Pulse: (!) 110  Temp: 98.4 F (36.9 C)  TempSrc: Oral  SpO2: 96%  Weight: 120 lb 12.8 oz (54.8 kg)   Physical Exam  Constitutional: He is oriented to person, place, and time and well-developed, well-nourished, and in no distress.  HENT:  Head: Normocephalic and atraumatic.  Eyes: Conjunctivae and EOM are normal.  Cardiovascular: Regular rhythm and normal heart sounds.  Tachycardic rate.   Pulmonary/Chest: Effort normal and breath sounds normal. He has no wheezes. He has no rales.  Abdominal: Soft. Bowel sounds are normal. He exhibits no distension. There is no tenderness.  Musculoskeletal: He exhibits no edema, tenderness or deformity.  Decreased ROM with left shoulder flexion and abduction.  No notable loss of strength or tenderness to palpation of the shoulder joint or surrounding structure.   Neurological: He is  alert and oriented to person, place, and time.  Mild asterixis noted.   Skin: Skin is warm and dry.  Psychiatric: Mood and affect normal.     Assessment & Plan:   See Encounters Tab for problem based charting.  Patient discussed with Dr. Angelia Mould.  Alcohol withdrawal seizure [facility]) Patient reports he has not had a seizure since last follow up.  He has had a couple of falls but no seizure activity.  He has not been taking his phenytoin on a consistent basis.  Plan: - Continue phenytoin - RTC 3 months - I did not check a level today due to his inconsistently taking his medications.   Alcohol use disorder, moderate, dependence ([place]) Patient reports he is still actively drinking alcohol and drank this morning.  He is averaging about 3 beers per day but is drinking less hard liquor in the past month.  He has been taking Naltrexone 50mg  but does not do so daily.  He reports taking this about every 2 days or so.  He is not interested at this time in any sort of counseling for his EtOH use disorder.  I expressed to him that he may continue to benefit from Naltrexone taken daily as prescribed as his decreased liquor consumption may be indicative that he is actually benefitting from this medication.  Plan: - Encouraged daily Naltrexone 50mg  daily - RTC 3 months.   Hypertriglyceridemia He is inconsistently taking his statin and fenofibrate as he  is doing with his other medications.  He did not have any specific reason as to why this was the case.  He is not experiencing any adverse effect from the medications.  He states he will try to improve upon his adherence.  Plan: - Continue statin and fenofibrate.  Labs not checked today due to inconsistently taking his medications - RTC 3 months.  Can repeat labs then if he has been adherent.   Left shoulder pain He reports left shoulder pain x 1 day.  No precipitating trauma or injury.  He does not recall any fall on to his shoulder and does not  sleep on his left side.  He reports some decreased ROM with flexion and abduction.  There is no deformity or joint tenderness.  Strength is intact and 5/5 bilaterally.  He did have some posterior discomfort with strength testing but otherwise exam was unremarkable.  Suspect possible muscle strain or tendinopathy but given the duration of symptoms have just recommended conservative care for now with activity modifcation and ice/heat as tolerated.  Advised against NSAIDs given his EtOH use and risk for PUD.  Plan: - Conservative care as above - RTC 1-2 weeks if not improving for further evaluation.

## 2017-05-23 NOTE — Assessment & Plan Note (Addendum)
Patient reports he has not had a seizure since last follow up.  He has had a couple of falls but no seizure activity.  He has not been taking his phenytoin on a consistent basis.  Plan: - Continue phenytoin - RTC 3 months - I did not check a level today due to his inconsistently taking his medications.

## 2017-05-23 NOTE — Assessment & Plan Note (Signed)
Patient reports he is still actively drinking alcohol and drank this morning.  He is averaging about 3 beers per day but is drinking less hard liquor in the past month.  He has been taking Naltrexone 50mg  but does not do so daily.  He reports taking this about every 2 days or so.  He is not interested at this time in any sort of counseling for his EtOH use disorder.  I expressed to him that he may continue to benefit from Naltrexone taken daily as prescribed as his decreased liquor consumption may be indicative that he is actually benefitting from this medication.  Plan: - Encouraged daily Naltrexone 50mg  daily - RTC 3 months.

## 2017-05-23 NOTE — Assessment & Plan Note (Signed)
He is inconsistently taking his statin and fenofibrate as he is doing with his other medications.  He did not have any specific reason as to why this was the case.  He is not experiencing any adverse effect from the medications.  He states he will try to improve upon his adherence.  Plan: - Continue statin and fenofibrate.  Labs not checked today due to inconsistently taking his medications - RTC 3 months.  Can repeat labs then if he has been adherent.

## 2017-05-29 NOTE — Progress Notes (Signed)
Internal Medicine Clinic Attending  Case discussed with Dr. Wallace at the time of the visit.  We reviewed the resident's history and exam and pertinent patient test results.  I agree with the assessment, diagnosis, and plan of care documented in the resident's note.  

## 2017-08-01 NOTE — Addendum Note (Signed)
Addended by: Shela Leff on: 08/01/2017 10:10 AM   Modules accepted: Orders

## 2017-08-09 ENCOUNTER — Encounter: Payer: Self-pay | Admitting: *Deleted

## 2017-10-30 ENCOUNTER — Emergency Department (HOSPITAL_COMMUNITY)
Admission: EM | Admit: 2017-10-30 | Discharge: 2017-10-31 | Disposition: A | Discharge status: Home / Self Care | Payer: Self-pay | Attending: Emergency Medicine | Admitting: Emergency Medicine

## 2017-10-30 ENCOUNTER — Encounter (HOSPITAL_COMMUNITY): Payer: Self-pay | Admitting: Emergency Medicine

## 2017-10-30 ENCOUNTER — Other Ambulatory Visit: Payer: Self-pay

## 2017-10-30 DIAGNOSIS — F17210 Nicotine dependence, cigarettes, uncomplicated: Secondary | ICD-10-CM | POA: Insufficient documentation

## 2017-10-30 DIAGNOSIS — F1010 Alcohol abuse, uncomplicated: Secondary | ICD-10-CM | POA: Insufficient documentation

## 2017-10-30 DIAGNOSIS — R1010 Upper abdominal pain, unspecified: Secondary | ICD-10-CM

## 2017-10-30 DIAGNOSIS — R112 Nausea with vomiting, unspecified: Secondary | ICD-10-CM | POA: Insufficient documentation

## 2017-10-30 DIAGNOSIS — I10 Essential (primary) hypertension: Secondary | ICD-10-CM | POA: Insufficient documentation

## 2017-10-30 DIAGNOSIS — R1011 Right upper quadrant pain: Secondary | ICD-10-CM | POA: Insufficient documentation

## 2017-10-30 DIAGNOSIS — Z79899 Other long term (current) drug therapy: Secondary | ICD-10-CM | POA: Insufficient documentation

## 2017-10-30 LAB — CBC
HCT: 39.7 % (ref 39.0–52.0)
HEMOGLOBIN: 13.9 g/dL (ref 13.0–17.0)
MCH: 32.5 pg (ref 26.0–34.0)
MCHC: 35 g/dL (ref 30.0–36.0)
MCV: 92.8 fL (ref 78.0–100.0)
PLATELETS: 230 10*3/uL (ref 150–400)
RBC: 4.28 MIL/uL (ref 4.22–5.81)
RDW: 14.6 % (ref 11.5–15.5)
WBC: 6.5 10*3/uL (ref 4.0–10.5)

## 2017-10-30 MED ORDER — ONDANSETRON 4 MG PO TBDP: 4.0000 mg | ORAL_TABLET | Freq: Once | ORAL | Status: DC | PRN

## 2017-10-30 NOTE — ED Triage Notes (Signed)
Pt presents by EMS for evaluation of abdominal pain for the last several months that MD advised he needed an Ultrasound but has not been done yet. BP 172/116, Pulse 98, SPO298%, CBG 70  Pt reports having pain to right upper abdomen with the feeling of a knot in abdomen for several months. Pt reports having vomiting.

## 2017-10-30 NOTE — ED Provider Notes (Signed)
TIME SEEN: 11:37 PM  CHIEF COMPLAINT: Abdominal pain  HPI: Patient is a 52 year old male with history of hypertension, hyperlipidemia, alcohol abuse who presents to the emergency department with several months of right upper quadrant abdominal pain worse over the past several days.  Has had nausea, vomiting.  No diarrhea.  No bloody stool or melena.  Last drink was today.  Reports he does not drink every day.  States he thinks he was told he has cirrhosis.  Denies history of abdominal surgery.  ROS: See HPI Constitutional: no fever  Eyes: no drainage  ENT: no runny nose   Cardiovascular:  no chest pain  Resp: no SOB  GI:  vomiting GU: no dysuria Integumentary: no rash  Allergy: no hives  Musculoskeletal: no leg swelling  Neurological: no slurred speech ROS otherwise negative  PAST MEDICAL HISTORY/PAST SURGICAL HISTORY:  Past Medical History:  Diagnosis Date  . Alcohol abuse   . Alcohol related seizure ([place])   . Allergy   . Hiccups   . HTN (hypertension)   . Hyperlipidemia     MEDICATIONS:  Prior to Admission medications   Medication Sig Start Date End Date Taking? Authorizing Provider  atorvastatin (LIPITOR) 20 MG tablet Take 1 tablet (20 mg total) by mouth daily. IM program. 04/25/17  Yes Shela Leff, MD  folic acid (FOLVITE) 1 MG tablet Take 1 tablet (1 mg total) by mouth daily. 11/01/16  Yes Shela Leff, MD  phenytoin (DILANTIN) 300 MG ER capsule Take 1 capsule (300 mg total) by mouth daily. 11/01/16  Yes Shela Leff, MD  diltiazem (CARDIZEM CD) 120 MG 24 hr capsule Take 1 capsule (120 mg total) by mouth daily. IM program [facility] Patient not taking: Reported on 10/30/2017 04/25/17 04/25/18  Shela Leff, MD  fenofibrate (TRICOR) 145 MG tablet Take 1 tablet (145 mg total) by mouth daily. IM program Patient not taking: Reported on 10/30/2017 04/25/17   Shela Leff, MD  magnesium chloride (SLOW-MAG) 64 MG TBEC SR tablet Take 2 tablets (128 mg  total) by mouth daily. [facility] Patient not taking: Reported on 10/30/2017 10/19/16   Burgess Estelle, MD  naltrexone (DEPADE) 50 MG tablet Take 1 tablet (50 mg total) by mouth daily. IM Program. Patient not taking: Reported on 10/30/2017 04/12/16   Shela Leff, MD  sildenafil (REVATIO) 20 MG tablet Take 2 tablets (40 mg total) by mouth daily as needed. IM program. Patient not taking: Reported on 10/30/2017 07/05/16   Shela Leff, MD    ALLERGIES:  No Known Allergies  SOCIAL HISTORY:  Social History   Tobacco Use  . Smoking status: Current Every Day Smoker    Packs/day: 0.01    Years: 37.00    Pack years: 0.37    Types: Cigarettes  . Smokeless tobacco: Never Used  . Tobacco comment: 1 cigs per day  Substance Use Topics  . Alcohol use: Yes    Alcohol/week: 88.0 standard drinks    Types: 14 Cans of beer, 74 Shots of liquor per week    Comment: Beer 40oz 4 days a week    FAMILY HISTORY: Family History  Problem Relation Age of Onset  . Hypertension Mother   . Colon cancer Neg Hx   . Esophageal cancer Neg Hx   . Rectal cancer Neg Hx   . Stomach cancer Neg Hx     EXAM: BP (!) 162/104 (BP Location: Left Arm)   Pulse (!) 106   Temp 98.7 F (37.1 C) (Oral)  Resp 14   Ht 5\' 5"  (1.651 m)   Wt 59 kg   SpO2 95%   BMI 21.63 kg/m  CONSTITUTIONAL: Alert and oriented and responds appropriately to questions. Well-appearing; well-nourished HEAD: Normocephalic EYES: Conjunctivae clear, pupils appear equal, EOMI ENT: normal nose; moist mucous membranes NECK: Supple, no meningismus, no nuchal rigidity, no LAD  CARD: RRR; S1 and S2 appreciated; no murmurs, no clicks, no rubs, no gallops RESP: Normal chest excursion without splinting or tachypnea; breath sounds clear and equal bilaterally; no wheezes, no rhonchi, no rales, no hypoxia or respiratory distress, speaking full sentences ABD/GI: Normal bowel sounds; non-distended; soft, tender in the upper abdomen and  right upper quadrant with negative Murphy sign, no rebound, no guarding, no peritoneal signs, no hepatosplenomegaly BACK:  The back appears normal and is non-tender to palpation, there is no CVA tenderness EXT: Normal ROM in all joints; non-tender to palpation; no edema; normal capillary refill; no cyanosis, no calf tenderness or swelling    SKIN: Normal color for age and race; warm; no rash NEURO: Moves all extremities equally PSYCH: The patient's mood and manner are appropriate. Grooming and personal hygiene are appropriate.  MEDICAL DECISION MAKING: Patient here with abdominal pain.  Differential includes cirrhosis, biliary colic, less likely cholecystitis given chronicity of symptoms, pancreatitis.  Will obtain labs, urine and a ultrasound of his abdomen.  He declines pain medicine at this time.  ED PROGRESS: Ultrasound shows gallbladder sludge.  No sign of acute cholecystitis or cholelithiasis.  Recommended that he stop drinking alcohol.  We will start him on PPI for possible alcoholic gastritis as well.  Will give outpatient GI follow-up.  I do not feel he needs emergent CT imaging at this time.  Doubt appendicitis, colitis, diverticulitis, bowel obstruction.  Patient comfortable with this plan.   At this time, I do not feel there is any life-threatening condition present. I have reviewed and discussed all results (EKG, imaging, lab, urine as appropriate) and exam findings with patient/family. I have reviewed nursing notes and appropriate previous records.  I feel the patient is safe to be discharged home without further emergent workup and can continue workup as an outpatient as needed. Discussed usual and customary return precautions. Patient/family verbalize understanding and are comfortable with this plan.  Outpatient follow-up has been provided if needed. All questions have been answered.      Ward, Delice Bison, DO 10/31/17 [phone]

## 2017-10-31 ENCOUNTER — Emergency Department (HOSPITAL_COMMUNITY): Payer: Self-pay

## 2017-10-31 LAB — URINALYSIS, ROUTINE W REFLEX MICROSCOPIC
BILIRUBIN URINE: NEGATIVE
Bacteria, UA: NONE SEEN
Glucose, UA: NEGATIVE mg/dL
Ketones, ur: NEGATIVE mg/dL
Leukocytes, UA: NEGATIVE
NITRITE: NEGATIVE
PH: 5 (ref 5.0–8.0)
Protein, ur: 30 mg/dL — AB
Specific Gravity, Urine: 1.003 — ABNORMAL LOW (ref 1.005–1.030)

## 2017-10-31 LAB — COMPREHENSIVE METABOLIC PANEL
ALT: 37 U/L (ref 0–44)
AST: 72 U/L — ABNORMAL HIGH (ref 15–41)
Albumin: 4.5 g/dL (ref 3.5–5.0)
Alkaline Phosphatase: 72 U/L (ref 38–126)
Anion gap: 18 — ABNORMAL HIGH (ref 5–15)
BUN: 14 mg/dL (ref 6–20)
CHLORIDE: 96 mmol/L — AB (ref 98–111)
CO2: 25 mmol/L (ref 22–32)
Calcium: 8.9 mg/dL (ref 8.9–10.3)
Creatinine, Ser: 0.6 mg/dL — ABNORMAL LOW (ref 0.61–1.24)
Glucose, Bld: 70 mg/dL (ref 70–99)
POTASSIUM: 3.6 mmol/L (ref 3.5–5.1)
Sodium: 139 mmol/L (ref 135–145)
Total Bilirubin: 0.9 mg/dL (ref 0.3–1.2)
Total Protein: 8 g/dL (ref 6.5–8.1)

## 2017-10-31 LAB — LIPASE, BLOOD: LIPASE: 36 U/L (ref 11–51)

## 2017-10-31 MED ORDER — ONDANSETRON 4 MG PO TBDP: 4.0000 mg | ORAL_TABLET | Freq: Four times a day (QID) | ORAL | 0 refills | Status: DC | PRN

## 2017-10-31 MED ORDER — PANTOPRAZOLE SODIUM 40 MG PO TBEC: 40.0000 mg | DELAYED_RELEASE_TABLET | Freq: Every day | ORAL | 1 refills | Status: DC

## 2017-11-02 MED FILL — ONDANSETRON ODT 4 MG TABLET: 4 | 5 days supply | Qty: 20 | Fill #0

## 2017-11-02 MED FILL — PANTOPRAZOLE SOD DR 40 MG T: 40 | 30 days supply | Qty: 30 | Fill #0

## 2017-11-02 MED FILL — CARTIA XT 120 MG CAPSULE SA: 120 | 30 days supply | Qty: 30 | Fill #1

## 2018-01-10 ENCOUNTER — Encounter (HOSPITAL_COMMUNITY)
Admission: EM | Disposition: A | Discharge status: Home / Self Care | Payer: Self-pay | Source: Home / Self Care | Attending: Emergency Medicine

## 2018-01-10 ENCOUNTER — Observation Stay (HOSPITAL_COMMUNITY)
Admission: EM | Admit: 2018-01-10 | Discharge: 2018-01-11 | Disposition: A | Discharge status: Home / Self Care | Payer: Self-pay | Attending: Internal Medicine | Admitting: Internal Medicine

## 2018-01-10 ENCOUNTER — Encounter (HOSPITAL_COMMUNITY): Payer: Self-pay | Admitting: Emergency Medicine

## 2018-01-10 ENCOUNTER — Ambulatory Visit (HOSPITAL_COMMUNITY): Admit: 2018-01-10 | Payer: Self-pay | Admitting: Interventional Cardiology

## 2018-01-10 ENCOUNTER — Emergency Department (HOSPITAL_COMMUNITY): Payer: Self-pay

## 2018-01-10 ENCOUNTER — Other Ambulatory Visit: Payer: Self-pay

## 2018-01-10 DIAGNOSIS — F10230 Alcohol dependence with withdrawal, uncomplicated: Secondary | ICD-10-CM

## 2018-01-10 DIAGNOSIS — Z79899 Other long term (current) drug therapy: Secondary | ICD-10-CM

## 2018-01-10 DIAGNOSIS — F10930 Alcohol use, unspecified with withdrawal, uncomplicated: Secondary | ICD-10-CM

## 2018-01-10 DIAGNOSIS — E872 Acidosis: Secondary | ICD-10-CM | POA: Insufficient documentation

## 2018-01-10 DIAGNOSIS — E785 Hyperlipidemia, unspecified: Secondary | ICD-10-CM

## 2018-01-10 DIAGNOSIS — Z9114 Patient's other noncompliance with medication regimen: Secondary | ICD-10-CM

## 2018-01-10 DIAGNOSIS — E876 Hypokalemia: Secondary | ICD-10-CM | POA: Insufficient documentation

## 2018-01-10 DIAGNOSIS — R252 Cramp and spasm: Secondary | ICD-10-CM | POA: Insufficient documentation

## 2018-01-10 DIAGNOSIS — F10939 Alcohol use, unspecified with withdrawal, unspecified: Secondary | ICD-10-CM | POA: Diagnosis present

## 2018-01-10 DIAGNOSIS — E878 Other disorders of electrolyte and fluid balance, not elsewhere classified: Secondary | ICD-10-CM

## 2018-01-10 DIAGNOSIS — E871 Hypo-osmolality and hyponatremia: Secondary | ICD-10-CM | POA: Insufficient documentation

## 2018-01-10 DIAGNOSIS — I10 Essential (primary) hypertension: Secondary | ICD-10-CM | POA: Insufficient documentation

## 2018-01-10 DIAGNOSIS — F10239 Alcohol dependence with withdrawal, unspecified: Principal | ICD-10-CM | POA: Insufficient documentation

## 2018-01-10 DIAGNOSIS — F17210 Nicotine dependence, cigarettes, uncomplicated: Secondary | ICD-10-CM | POA: Insufficient documentation

## 2018-01-10 DIAGNOSIS — E860 Dehydration: Secondary | ICD-10-CM | POA: Insufficient documentation

## 2018-01-10 DIAGNOSIS — R634 Abnormal weight loss: Secondary | ICD-10-CM | POA: Insufficient documentation

## 2018-01-10 DIAGNOSIS — R825 Elevated urine levels of drugs, medicaments and biological substances: Secondary | ICD-10-CM

## 2018-01-10 LAB — I-STAT CHEM 8, ED
BUN: 17 mg/dL (ref 6–20)
CALCIUM ION: 0.83 mmol/L — AB (ref 1.15–1.40)
CHLORIDE: 94 mmol/L — AB (ref 98–111)
Creatinine, Ser: 1.4 mg/dL — ABNORMAL HIGH (ref 0.61–1.24)
Glucose, Bld: 167 mg/dL — ABNORMAL HIGH (ref 70–99)
HEMATOCRIT: 54 % — AB (ref 39.0–52.0)
Hemoglobin: 18.4 g/dL — ABNORMAL HIGH (ref 13.0–17.0)
Potassium: 5 mmol/L (ref 3.5–5.1)
Sodium: 130 mmol/L — ABNORMAL LOW (ref 135–145)
TCO2: 30 mmol/L (ref 22–32)

## 2018-01-10 LAB — COMPREHENSIVE METABOLIC PANEL
ALBUMIN: 4.9 g/dL (ref 3.5–5.0)
ALT: 25 U/L (ref 0–44)
AST: 59 U/L — AB (ref 15–41)
Alkaline Phosphatase: 79 U/L (ref 38–126)
Anion gap: 20 — ABNORMAL HIGH (ref 5–15)
BUN: 12 mg/dL (ref 6–20)
CHLORIDE: 89 mmol/L — AB (ref 98–111)
CO2: 25 mmol/L (ref 22–32)
Calcium: 9.5 mg/dL (ref 8.9–10.3)
Creatinine, Ser: 1.44 mg/dL — ABNORMAL HIGH (ref 0.61–1.24)
GFR calc Af Amer: >60 mL/min (ref >60)
GFR calc non Af Amer: 55 mL/min — ABNORMAL LOW (ref >60)
GLUCOSE: 149 mg/dL — AB (ref 70–99)
POTASSIUM: 3.3 mmol/L — AB (ref 3.5–5.1)
Sodium: 134 mmol/L — ABNORMAL LOW (ref 135–145)
Total Bilirubin: 1.3 mg/dL — ABNORMAL HIGH (ref 0.3–1.2)
Total Protein: 8.9 g/dL — ABNORMAL HIGH (ref 6.5–8.1)

## 2018-01-10 LAB — CBC
HEMATOCRIT: 46.3 % (ref 39.0–52.0)
Hemoglobin: 15.5 g/dL (ref 13.0–17.0)
MCH: 31.6 pg (ref 26.0–34.0)
MCHC: 33.5 g/dL (ref 30.0–36.0)
MCV: 94.3 fL (ref 80.0–100.0)
Platelets: 246 10*3/uL (ref 150–400)
RBC: 4.91 MIL/uL (ref 4.22–5.81)
RDW: 12.3 % (ref 11.5–15.5)
WBC: 7.2 10*3/uL (ref 4.0–10.5)
nRBC: 0 % (ref 0.0–0.2)

## 2018-01-10 LAB — APTT: aPTT: 29 seconds (ref 24–36)

## 2018-01-10 LAB — RAPID URINE DRUG SCREEN, HOSP PERFORMED
Amphetamines: NOT DETECTED
BARBITURATES: NOT DETECTED
Benzodiazepines: NOT DETECTED
Cocaine: NOT DETECTED
OPIATES: NOT DETECTED
TETRAHYDROCANNABINOL: NOT DETECTED

## 2018-01-10 LAB — I-STAT TROPONIN, ED: Troponin i, poc: 0.04 ng/mL (ref 0.00–0.08)

## 2018-01-10 LAB — MAGNESIUM
MAGNESIUM: 0.7 mg/dL — AB (ref 1.7–2.4)
Magnesium: 2.6 mg/dL — ABNORMAL HIGH (ref 1.7–2.4)

## 2018-01-10 LAB — TROPONIN I
TROPONIN I: 0.09 ng/mL — AB (ref <0.03)
TROPONIN I: 0.07 ng/mL — AB (ref <0.03)

## 2018-01-10 LAB — PROTIME-INR
INR: 0.91
Prothrombin Time: 12.2 seconds (ref 11.4–15.2)

## 2018-01-10 LAB — MRSA PCR SCREENING: MRSA BY PCR: NEGATIVE

## 2018-01-10 LAB — PHENYTOIN LEVEL, TOTAL

## 2018-01-10 LAB — PHOSPHORUS: Phosphorus: 4.1 mg/dL (ref 2.5–4.6)

## 2018-01-10 SURGERY — LEFT HEART CATH AND CORONARY ANGIOGRAPHY
Anesthesia: LOCAL

## 2018-01-10 MED ORDER — ACETAMINOPHEN 325 MG PO TABS: 650.0000 mg | ORAL_TABLET | Freq: Four times a day (QID) | ORAL | Status: DC | PRN

## 2018-01-10 MED ORDER — POTASSIUM CHLORIDE IN NACL 20-0.9 MEQ/L-% IV SOLN
INTRAVENOUS | Status: AC
  Administered 2018-01-10 – 2018-01-11 (×2): via INTRAVENOUS
  Filled 2018-01-10 (×2): qty 1000

## 2018-01-10 MED ORDER — FOLIC ACID 1 MG PO TABS: 1.0000 mg | ORAL_TABLET | Freq: Every day | ORAL | Status: DC

## 2018-01-10 MED ORDER — INSULIN ASPART 100 UNIT/ML ~~LOC~~ SOLN: 0.0000 [IU] | Freq: Three times a day (TID) | SUBCUTANEOUS | Status: DC

## 2018-01-10 MED ORDER — POTASSIUM CHLORIDE CRYS ER 20 MEQ PO TBCR
40.0000 meq | EXTENDED_RELEASE_TABLET | Freq: Once | ORAL | Status: AC
  Administered 2018-01-10: 40 meq via ORAL
  Filled 2018-01-10: qty 2

## 2018-01-10 MED ORDER — SODIUM CHLORIDE 0.9% FLUSH
3.0000 mL | Freq: Two times a day (BID) | INTRAVENOUS | Status: DC
  Administered 2018-01-11: 3 mL via INTRAVENOUS

## 2018-01-10 MED ORDER — ENOXAPARIN SODIUM 40 MG/0.4ML ~~LOC~~ SOLN
40.0000 mg | SUBCUTANEOUS | Status: DC
  Administered 2018-01-10: 40 mg via SUBCUTANEOUS
  Filled 2018-01-10: qty 0.4

## 2018-01-10 MED ORDER — LORAZEPAM 2 MG/ML IJ SOLN: 2.0000 mg | INTRAMUSCULAR | Status: DC | PRN

## 2018-01-10 MED ORDER — LORAZEPAM 2 MG/ML IJ SOLN
1.0000 mg | Freq: Once | INTRAMUSCULAR | Status: AC
  Administered 2018-01-10: 1 mg via INTRAVENOUS
  Filled 2018-01-10: qty 1

## 2018-01-10 MED ORDER — ACETAMINOPHEN 650 MG RE SUPP: 650.0000 mg | Freq: Four times a day (QID) | RECTAL | Status: DC | PRN

## 2018-01-10 MED ORDER — FOLIC ACID 1 MG PO TABS
1.0000 mg | ORAL_TABLET | Freq: Every day | ORAL | Status: DC
  Administered 2018-01-11: 1 mg via ORAL
  Filled 2018-01-10: qty 1

## 2018-01-10 MED ORDER — LABETALOL HCL 5 MG/ML IV SOLN
10.0000 mg | Freq: Four times a day (QID) | INTRAVENOUS | Status: DC | PRN
  Administered 2018-01-10: 10 mg via INTRAVENOUS
  Filled 2018-01-10: qty 4

## 2018-01-10 MED ORDER — ASPIRIN 81 MG PO CHEW: 324.0000 mg | CHEWABLE_TABLET | Freq: Once | ORAL | Status: DC

## 2018-01-10 MED ORDER — SODIUM CHLORIDE 0.9 % IV SOLN
1000.0000 mg | Freq: Once | INTRAVENOUS | Status: DC
  Filled 2018-01-10: qty 20

## 2018-01-10 MED ORDER — VITAMIN B-1 100 MG PO TABS: 100.0000 mg | ORAL_TABLET | Freq: Every day | ORAL | Status: DC

## 2018-01-10 MED ORDER — VITAMIN B-1 100 MG PO TABS
100.0000 mg | ORAL_TABLET | Freq: Every day | ORAL | Status: DC
  Administered 2018-01-11: 100 mg via ORAL
  Filled 2018-01-10: qty 1

## 2018-01-10 MED ORDER — ADULT MULTIVITAMIN W/MINERALS CH
1.0000 | ORAL_TABLET | Freq: Every day | ORAL | Status: DC
  Administered 2018-01-11: 1 via ORAL
  Filled 2018-01-10: qty 1

## 2018-01-10 MED ORDER — VITAMIN B-1 100 MG PO TABS
100.0000 mg | ORAL_TABLET | Freq: Once | ORAL | Status: AC
  Administered 2018-01-10: 100 mg via ORAL
  Filled 2018-01-10: qty 1

## 2018-01-10 MED ORDER — ADULT MULTIVITAMIN W/MINERALS CH: 1.0000 | ORAL_TABLET | Freq: Every day | ORAL | Status: DC

## 2018-01-10 MED ORDER — SODIUM CHLORIDE 0.9 % IV BOLUS
1000.0000 mL | Freq: Once | INTRAVENOUS | Status: AC
  Administered 2018-01-10: 1000 mL via INTRAVENOUS

## 2018-01-10 MED ORDER — MAGNESIUM SULFATE 4 GM/100ML IV SOLN
4.0000 g | Freq: Once | INTRAVENOUS | Status: AC
  Administered 2018-01-10: 4 g via INTRAVENOUS
  Filled 2018-01-10: qty 100

## 2018-01-10 MED ORDER — THIAMINE HCL 100 MG/ML IJ SOLN
Freq: Once | INTRAVENOUS | Status: AC
  Administered 2018-01-10: 14:00:00 via INTRAVENOUS
  Filled 2018-01-10: qty 1000

## 2018-01-10 MED ORDER — ENSURE ENLIVE PO LIQD: 237.0000 mL | Freq: Two times a day (BID) | ORAL | Status: DC

## 2018-01-10 NOTE — Progress Notes (Signed)
Notified Dr. Donne Hazel of the following critical lab values: Magnesium 0.7 and Troponin 0.09. New orders received and in Epic.

## 2018-01-10 NOTE — ED Provider Notes (Signed)
[facility]GENCY DEPART[facility] Provider Note   CSN: [id number] Arrival date & time: 01/10/18  1145     History   Chief Complaint Chief Complaint  Patient presents with  . Code STEMI    HPI Joseph Underwood is a 52 y.o. male.  HPI  Patient is a 52 year old male with a known history of alcohol abuse as well as a history of hypertension hyperlipidemia, currently taking Lipitor, Dilantin, as well as sildenafil and Cardizem.  He presents with a complaint of left arm cramping as well as left leg cramping.  He has been intermittently diaphoretic and generally weak however he has had not had any shortness of breath, cough or chest pain.  He denies any swelling of the legs.  Paramedics did a prehospital EKG and found the patient to be tachycardic with some abnormal ST segments in the anterior leads.  He was activated as a code STEMI however this was deactivated when it was realized that his prior EKG was very similar and these changes were thought to be rate related per Dr. Tamala Julian through the cardiology coordinator.  The patient is not having active chest pain on arrival.  Denies use of cocaine.  He does smoke every day.  Past Medical History:  Diagnosis Date  . Alcohol abuse   . 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 Allergy   . Hiccups   . HTN (hypertension)   . Hyperlipidemia     Patient Active Problem List   Diagnosis Date Noted  . Left shoulder pain 05/23/2017  . Erectile dysfunction 04/13/2016  . Preventative health care 04/12/2016  . 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 Tachycardia 03/28/2014  . Liver cirrhosis ([place]) 03/22/2014  . Hypertriglyceridemia 04/24/2013  . Tobacco use 04/24/2013  . Essential hypertension, benign 01/01/2013  . Alcohol use disorder, moderate, dependence ([place]) 04/13/2006  . Alcohol withdrawal seizure ([place]) 04/13/2006    Past Surgical History:  Procedure Laterality Date  . NO PAST SURGERIES          Home  Medications    Prior to Admission medications   Medication Sig Start Date End Date Taking? Authorizing Provider  diltiazem (CARDIZEM CD) 120 MG 24 hr capsule Take 1 capsule (120 mg total) by mouth daily. IM program [facility] 04/25/17 04/25/18 Yes Shela Leff, MD  atorvastatin (LIPITOR) 20 MG tablet Take 1 tablet (20 mg total) by mouth daily. IM program. Patient not taking: Reported on 01/10/2018 04/25/17   Shela Leff, MD  fenofibrate (TRICOR) 145 MG tablet Take 1 tablet (145 mg total) by mouth daily. IM program Patient not taking: Reported on 10/30/2017 04/25/17   Shela Leff, MD  folic acid (FOLVITE) 1 MG tablet Take 1 tablet (1 mg total) by mouth daily. Patient not taking: Reported on 01/10/2018 11/01/16   Shela Leff, MD  magnesium chloride (SLOW-MAG) 64 MG TBEC SR tablet Take 2 tablets (128 mg total) by mouth daily. [facility] IM [facility] Patient not taking: Reported on 01/10/2018 10/19/16   Burgess Estelle, MD  naltrexone (DEPADE) 50 MG tablet Take 1 tablet (50 mg total) by mouth daily. IM Program. Patient not taking: Reported on 10/30/2017 04/12/16   Shela Leff, MD  ondansetron (ZOFRAN ODT) 4 MG disintegrating tablet Take 1 tablet (4 mg total) by mouth every 6 (six) hours as needed for nausea or vomiting. Patient not taking: Reported on 01/10/2018 10/31/17   Ward, Delice Bison, DO  pantoprazole (PROTONIX) 40 MG tablet Take 1 tablet (40 mg total) by mouth  daily. Patient not taking: Reported on 01/10/2018 10/31/17   Ward, Delice Bison, DO  phenytoin (DILANTIN) 300 MG ER capsule Take 1 capsule (300 mg total) by mouth daily. Patient not taking: Reported on 01/10/2018 11/01/16   Shela Leff, MD  sildenafil (REVATIO) 20 MG tablet Take 2 tablets (40 mg total) by mouth daily as needed. IM program. Patient not taking: Reported on 01/10/2018 07/05/16   Shela Leff, MD    Family History Family History  Problem Relation Age of Onset  . Hypertension Mother   . Colon  cancer Neg Hx   . Esophageal cancer Neg Hx   . Rectal cancer Neg Hx   . Stomach cancer Neg Hx     Social History Social History   Tobacco Use  . Smoking status: Current Every Day Smoker    Packs/day: 0.01    Years: 37.00    Pack years: 0.37    Types: Cigarettes  . Smokeless tobacco: Never Used  . Tobacco comment: 1 cigs per day  Substance Use Topics  . Alcohol use: Yes    Alcohol/week: 88.0 standard drinks    Types: 14 Cans of beer, 74 Shots of liquor per week    Comment: Beer 40oz 4 days a week  . Drug use: No     Allergies   Patient has no known allergies.   Review of Systems Review of Systems  All other systems reviewed and are negative.    Physical Exam Updated Vital Signs BP (!) 171/117   Pulse (!) 111   Resp (!) 25   SpO2 96%   Physical Exam  Constitutional: He appears well-developed and well-nourished. He appears distressed.  HENT:  Head: Normocephalic and atraumatic.  Mouth/Throat: Oropharynx is clear and moist. No oropharyngeal exudate.  Eyes: Pupils are equal, round, and reactive to light. Conjunctivae and EOM are normal. Right eye exhibits no discharge. Left eye exhibits no discharge. No scleral icterus.  Neck: Normal range of motion. Neck supple. No JVD present. No thyromegaly present.  Cardiovascular: Regular rhythm, normal heart sounds and intact distal pulses. Exam reveals no gallop and no friction rub.  No murmur heard. tachycarida  Pulmonary/Chest: Effort normal and breath sounds normal. No respiratory distress. He has no wheezes. He has no rales.  Abdominal: Soft. Bowel sounds are normal. He exhibits no distension and no mass. There is no tenderness.  Musculoskeletal: Normal range of motion. He exhibits no edema or tenderness.  Lymphadenopathy:    He has no cervical adenopathy.  Neurological: He is alert. Coordination normal.  Skin: Skin is warm and dry. No rash noted. No erythema.  Psychiatric: He has a normal mood and affect. His  behavior is normal.  Nursing note and vitals reviewed.    ED Treatments / Results  Labs (all labs ordered are listed, but only abnormal results are displayed) Labs Reviewed  COMPREHENSIVE METABOLIC PANEL - Abnormal; Notable for the following components:      Result Value   Sodium 134 (*)    Potassium 3.3 (*)    Chloride 89 (*)    Glucose, Bld 149 (*)    Creatinine, Ser 1.44 (*)    Total Protein 8.9 (*)    AST 59 (*)    Total Bilirubin 1.3 (*)    GFR calc non Af Amer 55 (*)    Anion gap 20 (*)    All other components within normal limits  PHENYTOIN LEVEL, TOTAL - Abnormal; Notable for the following components:   Phenytoin Lvl <2.5 (*)  All other components within normal limits  I-STAT CHEM 8, ED - Abnormal; Notable for the following components:   Sodium 130 (*)    Chloride 94 (*)    Creatinine, Ser 1.40 (*)    Glucose, Bld 167 (*)    Calcium, Ion 0.83 (*)    Hemoglobin 18.4 (*)    HCT 54.0 (*)    All other components within normal limits  CBC  APTT  PROTIME-INR  URINALYSIS, ROUTINE W REFLEX MICROSCOPIC  CBG MONITORING, ED  I-STAT TROPONIN, ED    EKG EKG Interpretation  Date/Time:  Wednesday January 10 2018 11:51:37 EST Ventricular Rate:  127 PR Interval:    QRS Duration: 95 QT Interval:  364 QTC Calculation: 530 R Axis:   97 Text Interpretation:  Sinus tachycardia LAE, consider biatrial enlargement Left ventricular hypertrophy Probable lateral infarct, age indeterminate Anterior infarct, acute (LAD) Prolonged QT interval Baseline wander in lead(s) II aVR compared with prior the ST elevation inthe anterior leads appears more prominent, no reciprocal changes Rate faster Confirmed by Noemi Chapel [phone]) on 01/10/2018 11:59:45 AM   Radiology Dg Chest Portable 1 View  Result Date: 01/10/2018 CLINICAL DATA:  Code STEMI. EXAM: PORTABLE CHEST 1 VIEW COMPARISON:  05/22/2014 FINDINGS: The heart size and mediastinal contours are within normal limits. Both lungs are  clear. The visualized skeletal structures are unremarkable. Bilateral carotid artery calcifications. IMPRESSION: No acute abnormalities.  Bilateral carotid artery calcifications. Electronically Signed   By: Lorriane Shire M.D.   On: 01/10/2018 12:03    Procedures Procedures (including critical care time)  Medications Ordered in ED Medications  aspirin chewable tablet 324 mg (324 mg Oral Not Given 01/10/18 1151)  sodium chloride 0.9 % 1,000 mL with thiamine 297 mg, folic acid 1 mg, multivitamins adult 10 mL infusion (has no administration in time range)  thiamine (VITAMIN B-1) tablet 100 mg (has no administration in time range)  LORazepam (ATIVAN) injection 1 mg (has no administration in time range)  sodium chloride 0.9 % bolus 1,000 mL (has no administration in time range)  phenytoin (DILANTIN) 1,000 mg in sodium chloride 0.9 % 250 mL IVPB (has no administration in time range)  LORazepam (ATIVAN) injection 1 mg (1 mg Intravenous Given 01/10/18 1230)  sodium chloride 0.9 % bolus 1,000 mL (0 mLs Intravenous Stopped 01/10/18 1350)     Initial Impression / Assessment and Plan / ED Course  I have reviewed the triage vital signs and the nursing notes.  Pertinent labs & imaging results that were available during my care of the patient were reviewed by me and considered in my medical decision making (see chart for details).  Clinical Course as of Jan 10 1401  Wed Jan 10, 2018  1316 Chemistry shows a creatinine which has risen from 0.8-1.4, glucose is normal, sodium is 130, Dilantin is undetectable, potassium is slightly low and bilirubin is slightly up.  He does have an anion gap however the CO2 is 25.  CBC shows no leukocytosis or significant anemia.  Troponin is normal.   [BM]  1323 The patient will be given Dilantin, thiamine, banana bag, consider admission to the hospital   [BM]    Clinical Course User Index [BM] Noemi Chapel, MD    The patient is mildly diaphoretic, tachycardic and  mildly tremulous.  He now reports that his last drink of alcohol was last night and reports it was less than he usually drinks at 312 ounce beers when usually he drinks a sixpack plus liquor.  He does report that he feels like he is probably an alcoholic.  His repeat EKG compared to prehospital shows that he has ongoing ST elevations in leads V2 V3, there is no reciprocal changes, he does have evidence of biatrial enlargement.  The cardiology team is aware, will proceed with troponin, Ativan, fluids  Discussed care with the inpatient team, the internal medicine resident team will admit the patient to the hospital.  Though he is still mildly tachycardic he has improved.  I believe the majority of his symptoms are related to alcohol withdrawal, though further inpatient stabilizing evaluation and care will be needed  Final Clinical Impressions(s) / ED Diagnoses   Final diagnoses:  Alcohol withdrawal syndrome without complication ([place])  Hyponatremia  Hypokalemia      Noemi Chapel, MD 01/10/18 1402

## 2018-01-10 NOTE — H&P (Signed)
Date: 01/10/2018               Patient Name:  Joseph Underwood MRN: [id number]  DOB: [birth year] Age / Sex: 52 y.o., male   PCP: Dorrell, Andree Elk, MD         Medical Service: Internal Medicine Teaching Service         Attending Physician: Dr. Annia Belt, MD    First Contact: Dr. Donne Hazel Pager: [phone]  Second Contact: Dr. Frederico Hamman Pager: [phone]       After Hours (After 5p/  First Contact Pager: [phone]  weekends / holidays): Second Contact Pager: [phone]   Chief Complaint: left arm cramping  History of Present Illness:  52yoM with alcohol abuse c/b withdrawal seizures, ?cirrhosis, HTN, HLD who presents with bilateral arm and left leg cramping while at work. He is accompanied by his mom.   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 He denied associated chest pain or shortness of breath. He reports similar episodes of arm/leg cramping in the past but this time was more severe. EMS was called and the cramping resolved without intervention.  Upon arrival to the ED, initial EKG was concerning for ST elevations in V2-V3 but after comparison to priors, the ST elevations were found to be unchanged from prior EKGs. He was noted to be diaphoretic, tachycardic, and hypertensive. No chest pain.    He has a history of alcohol use complicated by withdrawal seizures and states that his last drink was last night around 7:30pm. He usually drinks a 6-pack plus liquor daily but yesterday he only drank three 12 oz beers. He has not taken any of his medicines in the last 3 days. He has been prescribed phenytoin but has not taken this medicine in 3 days. His last seizure was years ago.  He denies history of alcohol withdrawal seizure but per chart review, he has a long history of alcohol abuse and withdrawal with multiple hospital admissions, one of  which required Precedex drip.   He denies decreased appetite, fevers, chills, vomiting, diarrhea.    Meds:  Current Meds  Medication Sig  . diltiazem (CARDIZEM CD) 120 MG 24 hr capsule Take 1 capsule (120 mg total) by mouth daily. IM program [facility]   Patient last took the below medicines three days ago: - Phenytoin 300mg  qd - naltrexone 50mg  qd - atorvastatin 20mg  daily - fenofibrate  145mg  qd   Allergies: Allergies as of 01/10/2018  . (No Known Allergies)   Past Medical History:  Diagnosis Date  . Alcohol abuse   . Alcohol related seizure ([place])   . Allergy   . Hiccups   . HTN (hypertension)   . Hyperlipidemia     Family History: mom HTN, brother seizures  Social History: reports significant alcohol use, "I drink more than I should." (40oz beers, 4x/week), 8 cigarettes/day for 35 years. Denies cocaine. Single. Lives with mother. Works as Sports coach.   Review of Systems: Review of Systems  Constitutional: Positive for diaphoresis. Negative for chills and fever.  HENT: Negative for sore throat.   Eyes: Positive for blurred vision.  Respiratory: Positive for cough. Negative for shortness of breath.   Cardiovascular: Negative for chest pain and leg swelling.  Gastrointestinal: Negative for abdominal pain, constipation and diarrhea.  Genitourinary: Negative for dysuria.  Neurological: Positive for dizziness and tremors.  Negative for seizures and loss of consciousness.  Psychiatric/Behavioral: Positive for substance abuse.     Physical Exam: Blood pressure (!) 202/121, pulse (!) 110, resp. rate 16, SpO2 96 %. Vitals:   01/10/18 1330 01/10/18 1400 01/10/18 1430 01/10/18 1500  BP: (!) 182/115 (!) 175/125 (!) 186/125 (!) 202/121  Pulse: (!) 119 (!) 110 (!) 102 (!) 110  Resp: 20 16 18 16   SpO2: 95% 91% 96% 96%   General: Tachycardic, hypertensive, diaphoretic, hiccups, appear uncomfortable but NAD Head: Normocephalic and atraumatic. Eyes: EOMI,+ scleral icterus.  PERRL Neck: Supple, trachea midline, normal ROM, no JVD, masses, thyromegaly,  Cardiovascular: Tachycardic, regular rhythm, S1 normal, S2 normal, no murmurs, gallops, or rubs. Pulmonary/Chest: Normal effort. Scant expiratory wheezing, no rales, or rhonchi. Abdominal: Soft, non-tender, non-distended, BS +, no masses, organomegaly, or guarding present.  Musculoskeletal: No joint deformities, erythema, or stiffness, ROM full and nontender. Extremities: No lower extremity edema bilaterally,  pulses symmetric and intact bilaterally. No cyanosis or clubbing. Neurological: A&O x3, Strength is normal and symmetric bilaterally, cranial nerve II-XII are grossly intact, no asterixis, no focal motor deficit, sensory intact to light touch bilaterally. Slight tremors with movement of extremities. Finger to nose testing in tact.  Skin: Warm, dry and intact. No rashes or erythema. Psychiatric: Normal mood. Anxious affect. Quiet speech. Normal behavior. Cognition and memory are normal. Little insight into alcohol use disorder   Labs: WBC 7.2 Hemoglobin 15.5  Sodium 134 Potassium 3.3 Glucose 149 Creatine 1.4 from baseline 0.8 BUN 12 AG 20 CO2 25  AST 59 ALT 25 T. Bili 1.3 INR 0.91  Troponin 0.04  Phenytoin level undetectable   EKG: personally reviewed my interpretation is sinus tachycardia, LVH, prolonged QT 530, ST elevations in V2-V3 are unchanged from prior   CXR: personally reviewed my interpretation is normal cardiac silhouette, no pulmonary opacities, effusions. No pneumothorax.    Assessment & Plan by Problem: Active Problems:   Alcohol withdrawal ([place])  52yoM with alcohol abuse c/b withdrawal seizures, ?cirrhosis, HTN, HLD who presents with left arm cramping while at work admitted for treatment of alcohol withdrawal.   1. Alcohol Withdrawal: History of alcohol withdrawal seizures, prescribed phenytoin but is not adherent. Per chart review, last seizure was Jan 2019. Also has history  of admission in 2016 during which he required a Precedex drip after symptoms did not respond to Ativan. He seems to have very poor insight into his alcohol use disorder or is just not very forthcoming with me. Last drink was 11/26 @ 7pm. Tachycardic, hypertensive, diaphoretic on presentation with labs notable for hypokalemia, slight hyponatremia, AST elevated twice that of the ALT. In the ED, he was given ativan 1mg  x 2, thiamine, 2L NS, and IV phenytoin. Signs and symptoms most consistent with alcohol withdrawal. Other differential includes ACS, infection, hyperglycemia, other substance intoxication. Denies chest pain. ST elevations on EKG are unchanged from prior. Initial troponin 0.04, will trend just to be safe. Afebrile, reports intermittent nonproductive cough but CXR clear, no dysuria, no abdominal pain, no leukocytosis. Low concern for infection. Glucose 149. No history of diabetes. - UDS  - trend troponins  - repeat EKG in the morning  - cardiac monitoring  - HIV - check Mg and Phos, monitor for refeeding syndrome  - CIWA, ativan prn - folate, thiamine  - NS + KCl 100cc/hr for 20 hours   2. ?Cirrhosis: Previous MR liver in 2017 showed mild morphologic changes of cirrhosis with lobular contour. Normal spleen. Normal portal vein flow.  Platelets and INR are within normal limits. T. Bili slightly elevated at 1.3. No abdominal pain. Hep A,B, and C were negative in 2016.  - Continue to monitor  3. HTN: Elevated 2/2 medication non compliance and alcohol withdrawal. Nonadherent with home diltiazem.  - labetalol IV prn   Dispo: Admit patient to Inpatient with expected length of stay greater than 2 midnights.  Signed: Isabelle Course, MD 01/10/2018, 4:28 PM  Pager: [phone]

## 2018-01-10 NOTE — ED Triage Notes (Signed)
Pt arrives as a code [facility] by [facility] but stopped in ED to confirm due to abnormal EKG but no currently chest pain. Pt called ems while at work for increase cramping in left arm that started at 11pm . Pt also diaphoretic.

## 2018-01-10 NOTE — ED Notes (Signed)
Verified ativan administration with MD and pharmacy d/t prolonged qt. Okay to administer.

## 2018-01-11 DIAGNOSIS — N179 Acute kidney failure, unspecified: Secondary | ICD-10-CM

## 2018-01-11 DIAGNOSIS — Z681 Body mass index (BMI) 19 or less, adult: Secondary | ICD-10-CM

## 2018-01-11 DIAGNOSIS — R7989 Other specified abnormal findings of blood chemistry: Secondary | ICD-10-CM

## 2018-01-11 DIAGNOSIS — R634 Abnormal weight loss: Secondary | ICD-10-CM

## 2018-01-11 LAB — CBC
HCT: 37.2 % — ABNORMAL LOW (ref 39.0–52.0)
HEMOGLOBIN: 12.7 g/dL — AB (ref 13.0–17.0)
MCH: 32.4 pg (ref 26.0–34.0)
MCHC: 34.1 g/dL (ref 30.0–36.0)
MCV: 94.9 fL (ref 80.0–100.0)
Platelets: 194 10*3/uL (ref 150–400)
RBC: 3.92 MIL/uL — ABNORMAL LOW (ref 4.22–5.81)
RDW: 12.7 % (ref 11.5–15.5)
WBC: 6.8 10*3/uL (ref 4.0–10.5)
nRBC: 0 % (ref 0.0–0.2)

## 2018-01-11 LAB — PHOSPHORUS: PHOSPHORUS: 3 mg/dL (ref 2.5–4.6)

## 2018-01-11 LAB — COMPREHENSIVE METABOLIC PANEL
ALBUMIN: 3.5 g/dL (ref 3.5–5.0)
ALT: 17 U/L (ref 0–44)
AST: 38 U/L (ref 15–41)
Alkaline Phosphatase: 51 U/L (ref 38–126)
Anion gap: 8 (ref 5–15)
BUN: 10 mg/dL (ref 6–20)
CO2: 24 mmol/L (ref 22–32)
Calcium: 7.7 mg/dL — ABNORMAL LOW (ref 8.9–10.3)
Chloride: 104 mmol/L (ref 98–111)
Creatinine, Ser: 1 mg/dL (ref 0.61–1.24)
GFR calc Af Amer: >60 mL/min (ref >60)
Glucose, Bld: 98 mg/dL (ref 70–99)
POTASSIUM: 3.9 mmol/L (ref 3.5–5.1)
Sodium: 136 mmol/L (ref 135–145)
TOTAL PROTEIN: 6.7 g/dL (ref 6.5–8.1)
Total Bilirubin: 1.1 mg/dL (ref 0.3–1.2)

## 2018-01-11 LAB — TSH: TSH: 0.64 u[IU]/mL (ref 0.350–4.500)

## 2018-01-11 LAB — TROPONIN I: Troponin I: 0.08 ng/mL (ref <0.03)

## 2018-01-11 LAB — HIV ANTIBODY (ROUTINE TESTING W REFLEX): HIV SCREEN 4TH GENERATION: NONREACTIVE

## 2018-01-11 LAB — MAGNESIUM: MAGNESIUM: 1.8 mg/dL (ref 1.7–2.4)

## 2018-01-11 MED ORDER — AMLODIPINE BESYLATE 10 MG PO TABS: 10.0000 mg | ORAL_TABLET | Freq: Every day | ORAL | 0 refills | Status: DC

## 2018-01-11 MED ORDER — AMLODIPINE BESYLATE 10 MG PO TABS: 10.0000 mg | ORAL_TABLET | Freq: Every day | ORAL | Status: DC

## 2018-01-11 MED ORDER — AMLODIPINE BESYLATE 5 MG PO TABS
5.0000 mg | ORAL_TABLET | Freq: Every day | ORAL | Status: DC
  Administered 2018-01-11: 5 mg via ORAL
  Filled 2018-01-11: qty 1

## 2018-01-11 NOTE — Progress Notes (Signed)
   Subjective: No acute events overnight.  His chest pain, shortness of breath, palpitations or feelings of anxiety.  He continues to be hypertensive, tachycardia is slightly improved.  He is not ready to stop drinking.  Objective:  Vital signs in last 24 hours: Vitals:   01/10/18 2353 01/11/18 0400 01/11/18 0435 01/11/18 0825  BP: (!) 159/107 (!) 160/104 (!) 146/102 (!) 175/117  Pulse: 93 95 98 100  Resp:   20 20  Temp: 97.8 F (36.6 C)  98.4 F (36.9 C) 98.4 F (36.9 C)  TempSrc: Oral  Oral Oral  SpO2: 99%  100%   Weight:   48.1 kg   Height:       General: Sitting up in bed eating breakfast, diaphoretic and slightly tremulous Cardiac: Tachycardic, regular rhythm, no murmurs rubs or gallops. Pulmonary: Clear to auscultation bilaterally Abdomen: Soft, nontender, nondistended Extremities: Slight hand tremor bilaterally  Assessment/Plan:  Active Problems:   Alcohol withdrawal ([facility])  1.  Alcohol withdrawal: Remains tachycardic and hypertensive.  Has not required any more Ativan overnight.  Did receive 1 dose of labetalol yesterday evening.  He denies feelings of anxiousness or shakiness.  While speaking with him he appears diaphoretic.  Discussed continuing inpatient hospitalization for alcohol detox versus sending him home if he is just going to keep drinking.  He is seen in our clinic and we have had multiple discussions about alcohol cessation.  He has been prescribed naltrexone and would like to continue taking this.  He states that he would like to quit drinking altogether, but he is not ready to quit drinking at this time. -Follow-up TSH  2.  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  Magnesium on admission was 0.7 and is the most likely cause of his cramping.  This is been repleted and is 1.8 this morning.  He denies any more arm or leg cramping.  3.  Elevated troponin: Troponin on admission was 0.09, likely secondary to demand  ischemia with his persistent tachycardia.  Troponins have down trended overnight.  Repeat EKGs are consistent with priors, no new ischemic changes.  4.  Hypertension: Uncontrolled.  Prescribed diltiazem for hypertension and tachycardia in the outpatient setting but is nonadherent.  Given African-American ethnicity, thiazide diuretic or calcium channel blocker would be first-line.  Would not recommend thiazide diuretic given previous electrolyte abnormalities and alcohol use. -Start amlodipine 10 mg daily  6.  Dehydration: Resolved.  Creatinine elevated to 1.4 on admission.  This has decreased to 1.0 after IV fluids.  Metabolic acidosis has resolved this morning with IV fluids.  Dispo: Anticipated discharge in approximately 0-1 day(s).  If patient remains stable this afternoon, he is safe to discharge.  He is not interested in detoxing from alcohol at this time so continued hospitalization for detox would serve little purpose since patient states that he is going to continue drinking once he gets home.  Isabelle Course, MD 01/11/2018, 11:37 AM Pager: [phone]

## 2018-01-11 NOTE — Evaluation (Signed)
Physical Therapy Evaluation Patient Details Name: Joseph Underwood MRN: [id number] DOB: 1965/06/18 Today's Date: 01/11/2018   History of Present Illness  Pt is a 52 y.o. M with significant PMH of alcohol abuse, withdrawal seizures, HTN, HLD, who presents with bilateral arm and left leg cramping at work. Admitted with early symptoms of alcohol withdrawal and HTN.  Clinical Impression  Pt admitted with above diagnosis. Pt currently with functional limitations due to the deficits listed below (see PT Problem List). Patient presenting with decreased functional mobility secondary to balance impairments. Ambulating 300 feet with min guard assist and no assistive device. Scoring 15/24 on Dynamic Gait Index, indicating pt is at high risk for falls. Suspect patient will progress quickly. Will follow acutely but no PT follow up recommended.  Prior to mobility: 164/113, HR 90s  Post mobility: 169/117 (RN aware), HR 120s     Follow Up Recommendations No PT follow up    Equipment Recommendations  None recommended by PT    Recommendations for Other Services       Precautions / Restrictions Precautions Precautions: Fall Precaution Comments: watch BP Restrictions Weight Bearing Restrictions: No      Mobility  Bed Mobility Overal bed mobility: Independent                Transfers Overall transfer level: Independent Equipment used: None                Ambulation/Gait Ambulation/Gait assistance: Min Conservator, museum/gallery (Feet): 300 Feet Assistive device: None Gait Pattern/deviations: Step-through pattern;Decreased dorsiflexion - right;Decreased dorsiflexion - left;Ataxic     General Gait Details: Pt with mildly ataxic gait and unsteadiness, requiring min guard assist for safety. Difficulty with dynamic balance challenges, compensating by slowing down or stopping  Financial trader Rankin (Stroke Patients Only)       Balance  Overall balance assessment: Needs assistance Sitting-balance support: Feet supported Sitting balance-Leahy Scale: Normal     Standing balance support: No upper extremity supported;During functional activity Standing balance-Leahy Scale: Good                   Standardized Balance Assessment Standardized Balance Assessment : Dynamic Gait Index   Dynamic Gait Index Level Surface: Mild Impairment Change in Gait Speed: Mild Impairment Gait with Horizontal Head Turns: Mild Impairment Gait with Vertical Head Turns: Mild Impairment Gait and Pivot Turn: Mild Impairment Step Over Obstacle: Moderate Impairment Step Around Obstacles: Mild Impairment Steps: Mild Impairment Total Score: 15       Pertinent Vitals/Pain Pain Assessment: No/denies pain    Home Living Family/patient expects to be discharged to:: Private residence Living Arrangements: Parent(mom) Available Help at Discharge: Family Type of Home: House Home Access: Stairs to enter   Technical brewer of Steps: 3 Home Layout: One level Home Equipment: None      Prior Function Level of Independence: Independent         Comments: Works in Electronics engineer   Dominant Hand: Left    Extremity/Trunk Assessment   Upper Extremity Assessment Upper Extremity Assessment: Overall WFL for tasks assessed    Lower Extremity Assessment Lower Extremity Assessment: Overall WFL for tasks assessed    Cervical / Trunk Assessment Cervical / Trunk Assessment: Normal  Communication   Communication: No difficulties  Cognition Arousal/Alertness: Awake/alert Behavior During Therapy: WFL for tasks assessed/performed Overall Cognitive Status: Within Functional Limits for  tasks assessed                                 General Comments: Pt becoming emotionally labile towards end of session, stating, "I'm not sure how to say this, but I feel like there's something more serious going on that  they aren't telling me." He then states, "The doctor told me it was related to alcohol but I feel like there's more they aren't telling me."       General Comments      Exercises     Assessment/Plan    PT Assessment Patient needs continued PT services  PT Problem List Decreased balance;Decreased mobility;Decreased coordination       PT Treatment Interventions Gait training;Stair training;Functional mobility training;Therapeutic activities;Therapeutic exercise;Balance training;Patient/family education    PT Goals (Current goals can be found in the Care Plan section)  Acute Rehab PT Goals Patient Stated Goal: "I want to know what's going on." PT Goal Formulation: With patient Time For Goal Achievement: 01/25/18 Potential to Achieve Goals: Good    Frequency Min 3X/week   Barriers to discharge        Co-evaluation               AM-PAC PT "6 Clicks" Mobility  Outcome Measure Help needed turning from your back to your side while in a flat bed without using bedrails?: None Help needed moving from lying on your back to sitting on the side of a flat bed without using bedrails?: None Help needed moving to and from a bed to a chair (including a wheelchair)?: None Help needed standing up from a chair using your arms (e.g., wheelchair or bedside chair)?: None Help needed to walk in hospital room?: A Little Help needed climbing 3-5 steps with a railing? : A Little 6 Click Score: 22    End of Session Equipment Utilized During Treatment: Gait belt Activity Tolerance: Patient tolerated treatment well Patient left: in bed;with call bell/phone within reach Nurse Communication: Mobility status PT Visit Diagnosis: Unsteadiness on feet (R26.81)    Time: 7106-2694 PT Time Calculation (min) (ACUTE ONLY): 15 min   Charges:   PT Evaluation $PT Eval Moderate Complexity: 1 Mod          Ellamae Sia, PT, DPT Acute Rehabilitation Services Pager [phone] Office  [phone]   Willy Eddy 01/11/2018, 10:29 AM

## 2018-01-12 MED FILL — AMLODIPINE BESYLATE 10 MG T: 10 | 30 days supply | Qty: 30 | Fill #0

## 2018-01-12 MED FILL — FENOFIBRATE 145 MG TABLET: 145 | 30 days supply | Qty: 30 | Fill #1

## 2018-01-12 NOTE — Discharge Summary (Signed)
Name: Joseph Underwood MRN: [id number] DOB: 12/18/1965 52 y.o. PCP: Corinne Ports, MD  Date of Admission: 01/10/2018 11:45 AM Date of Discharge: 01/11/2018 Attending Physician: No att. providers found  Discharge Diagnosis: 1.  Alcohol withdrawal 2.  Hypomagnesemia 3.  Hypertension 4. Unintentional weight loss  Discharge Medications: Allergies as of 01/11/2018   No Known Allergies     Medication List    STOP taking these medications   diltiazem 120 MG 24 hr capsule Commonly known as:  CARDIZEM CD   ondansetron 4 MG disintegrating tablet Commonly known as:  ZOFRAN-ODT     TAKE these medications   amLODipine 10 MG tablet Commonly known as:  NORVASC Take 1 tablet (10 mg total) by mouth daily.   atorvastatin 20 MG tablet Commonly known as:  LIPITOR Take 1 tablet (20 mg total) by mouth daily. IM program.   fenofibrate 145 MG tablet Commonly known as:  TRICOR Take 1 tablet (145 mg total) by mouth daily. IM program   folic acid 1 MG tablet Commonly known as:  FOLVITE Take 1 tablet (1 mg total) by mouth daily.   magnesium chloride 64 MG Tbec SR tablet Commonly known as:  SLOW-MAG Take 2 tablets (128 mg total) by mouth daily. [facility]   naltrexone 50 MG tablet Commonly known as:  DEPADE Take 1 tablet (50 mg total) by mouth daily. IM Program.   pantoprazole 40 MG tablet Commonly known as:  PROTONIX Take 1 tablet (40 mg total) by mouth daily.   phenytoin 300 MG ER capsule Commonly known as:  DILANTIN Take 1 capsule (300 mg total) by mouth daily.   sildenafil 20 MG tablet Commonly known as:  REVATIO Take 2 tablets (40 mg total) by mouth daily as needed. IM program.       Disposition and follow-up:   Joseph Underwood was discharged from [facility] in Stable condition.  At the hospital follow up visit please address:  1.  Please work up for unintentional weight loss, night sweats (Documented 15lb weight loss in 2 months).  See hospital course for more info.   #HTN: Nonadherent to outpatient medicines.  Previously prescribed Cardizem to help with hypertension and tachycardia.  We discontinued this and started him on amlodipine 10 mg daily.  EtOH abuse: Encourage alcohol cessation  2.  Labs / imaging needed at time of follow-up: free T4 (TSH was low end of normal), PSA (at patient's request)  3.  Pending labs/ test needing follow-up: none  Follow-up Appointments: Follow-up Information    [facility] Follow up.   Why:  Our clinic wil give you a call on Monday to schedule a hospital follow up appointment.  Contact information: [address] [place] Hospital Course by problem list: 75.  52 year old male with known alcohol use disorder and multiple prior admissions including admissions for withdrawal seizures.  He has been prescribed Dilantin but does not like to take it.  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  His magnesium was found to be severely low at 0.7, which was the likely cause of his cramps, and this was repleted.  On presentation he was tachycardic, hypertensive and diaphoretic.  Given his history of alcohol use and withdrawal seizures he was monitored overnight.  Vital signs slightly improved but  he remained diaphoretic.  He did not require additional doses of Ativan overnight and denied feeling anxious or tremulous.  We discussed the option to keep him in the hospital while he goes through withdrawal or symptom home if he is just going to continue drinking.  He stated that he would like to quit drinking but is not ready to at this time.  He has been taking naltrexone at home and would like to continue that.  He was discharged with instructions to take Dilantin.  #Unintentional weight loss: At time of discharge, patient expressed concern about a 15  pound weight loss over the last 4 months.  This is documented in our records.  Continued to be diaphoretic on discharge and states that his sweats are worse at night.  He denies family history of cancer.  He has a 15-pack-year smoking history.  He endorses symptoms of erectile dysfunction.  He inquired about checking a PSA and I will defer that to outpatient.  He denied urinary hesitancy or decreased stream.  We checked his TSH which was low end of normal.  #HTN: Nonadherent to outpatient medicines.  Previously prescribed Cardizem to help with hypertension and tachycardia.  We discontinued this and started him on amlodipine 10 mg daily.  Discharge Vitals:   BP (!) 158/109   Pulse 100   Temp 98.4 F (36.9 C) (Oral)   Resp 20   Ht 5\' 5"  (1.651 m)   Wt 48.1 kg   SpO2 100%   BMI 17.64 kg/m   Pertinent Labs, Studies, and Procedures:  Magnesium 0.7 on admission, 1.8 on discharge TSH 0.640  Discharge Instructions: Discharge Instructions    Diet - low sodium heart healthy   Complete by:  As directed    Discharge instructions   Complete by:  As directed    Joseph Underwood,  You were admitted to the hospital because your magnesium levels were really low, causing you to have muscle cramps.  And you were also showing signs of alcohol withdrawal with your fast heart rate and high blood pressure and sweating.  We were able to increase her magnesium levels by giving you medicine through your IV.  Your magnesium is now back to normal.  We also kept a close eye on you for concerns that you might have a seizure.  Thankfully this did not happen.  I do hope that you will one day decide to stop drinking.  I encourage you to continue taking naltrexone to help decrease your cravings for alcohol. When you are ready to stop drinking, we would be happy to help you!   For your high blood pressure, I would like to start you on a medicine called amlodipine.  I have sent this prescription to the [facility] outpatient  pharmacy.  Please stop taking your diltiazem.  And start taking amlodipine.  I have sent a message to the internal medicine clinic asking someone to call you and schedule a hospital follow-up appointment.  I enjoyed talking with you this afternoon. Please follow up in our Internal Medicine Clinic. I will write a nice note to the doctor in the clinic so they know what we talked about and what further testing needs to be done to work up your weight loss.   If you experience chest pain, palpitations, shortness of breath, increased tremors or sweating, seizures please go to the nearest emergency room.  It was a pleasure taking care of you. Happy Thanksgiving!  -Dr. Donne Hazel   Increase activity slowly  Complete by:  As directed       Signed: Isabelle Course, MD 01/12/2018, 10:40 AM   Pager: [phone]

## 2018-01-22 ENCOUNTER — Other Ambulatory Visit: Payer: Self-pay

## 2018-01-22 ENCOUNTER — Ambulatory Visit (INDEPENDENT_AMBULATORY_CARE_PROVIDER_SITE_OTHER): Payer: Self-pay | Admitting: Internal Medicine

## 2018-01-22 DIAGNOSIS — Z79899 Other long term (current) drug therapy: Secondary | ICD-10-CM

## 2018-01-22 DIAGNOSIS — I10 Essential (primary) hypertension: Secondary | ICD-10-CM

## 2018-01-22 MED ORDER — AMLODIPINE BESYLATE 10 MG PO TABS: 10.0000 mg | ORAL_TABLET | Freq: Every day | ORAL | 2 refills | Status: DC

## 2018-01-22 NOTE — Patient Instructions (Signed)
Joseph Underwood,  It was a pleasure meeting you today. Please continue to take your blood pressure medication daily. Please follow up with your primary care doctor at the end of January.

## 2018-01-22 NOTE — Progress Notes (Signed)
   CC: Follow-up on essential hypertension  HPI:  [name] is a 52 y.o. male with history noted below that presents to the acute care clinic for follow-up of essential hypertension. Please see problem based charting for the status of patient's chronic medical conditions.  Past Medical History:  Diagnosis Date  . Alcohol abuse   . Alcohol related seizure ([place])   . Allergy   . Hiccups   . HTN (hypertension)   . Hyperlipidemia     Review of Systems:  Review of Systems  Constitutional: Negative for chills and fever.  Respiratory: Negative for shortness of breath.   Cardiovascular: Negative for chest pain.  Musculoskeletal:       Denies leg and arm cramping     Physical Exam:  Vitals:   01/22/18 1025  BP: 125/71  Pulse: (!) 102  Temp: 98.4 F (36.9 C)  TempSrc: Oral  SpO2: 100%  Weight: 111 lb 11.2 oz (50.7 kg)  Height: 5\' 5"  (1.651 m)   Physical Exam  Constitutional: He is well-developed, well-nourished, and in no distress.  Cardiovascular: Normal rate, regular rhythm and normal heart sounds. Exam reveals no gallop and no friction rub.  No murmur heard. Pulmonary/Chest: Effort normal and breath sounds normal. No respiratory distress. He has no wheezes. He has no rales.     Assessment & Plan:   See encounters tab for problem based medical decision making.   Patient discussed with Dr. Lynnae January

## 2018-01-23 NOTE — Assessment & Plan Note (Addendum)
Assessment: Essential hypertension Patient was recently hospitalized and Cardizem was discontinued. He was started and amlodipine 10 mg daily. Today's blood pressure is 125/71 well controlled and at goal <130/80.  Denies side effects of the medication.  Plan -refill amlodipine 10mg  daily

## 2018-01-24 NOTE — Progress Notes (Signed)
Internal Medicine Clinic Attending  Case discussed with Dr. Hoffman at the time of the visit.  We reviewed the resident's history and exam and pertinent patient test results.  I agree with the assessment, diagnosis, and plan of care documented in the resident's note.  

## 2018-01-31 ENCOUNTER — Encounter: Payer: Self-pay | Admitting: Internal Medicine

## 2018-01-31 ENCOUNTER — Ambulatory Visit: Payer: Self-pay

## 2018-03-01 MED FILL — FENOFIBRATE 145 MG TABLET: 145 | 30 days supply | Qty: 30 | Fill #2

## 2018-03-01 MED FILL — ATORVASTATIN CALCIUM 20 MG: 20 | 30 days supply | Qty: 30 | Fill #1

## 2018-03-01 MED FILL — AMLODIPINE BESYLATE 10 MG T: 10 | 30 days supply | Qty: 30 | Fill #0

## 2018-03-01 MED FILL — CARTIA XT 120 MG CAPSULE: 120 | 30 days supply | Qty: 30 | Fill #2

## 2018-03-01 MED FILL — PANTOPRAZOLE SOD DR 40 MG T: 40 | 30 days supply | Qty: 30 | Fill #1

## 2018-03-08 ENCOUNTER — Other Ambulatory Visit: Payer: Self-pay | Admitting: *Deleted

## 2018-03-08 DIAGNOSIS — F1029 Alcohol dependence with unspecified alcohol-induced disorder: Secondary | ICD-10-CM

## 2018-03-08 MED ORDER — FOLIC ACID 1 MG PO TABS: 1.0000 mg | ORAL_TABLET | Freq: Every day | ORAL | 3 refills | Status: DC

## 2018-03-11 NOTE — Progress Notes (Deleted)
   CC: ***  HPI:   [name] is a 53 y.o.  Please see problem based charting for the history and status of the patient's current and chronic medical conditions.   Past Medical History:  Diagnosis Date  . Alcohol abuse   . Alcohol related seizure ([place])   . Allergy   . Hiccups   . HTN (hypertension)   . Hyperlipidemia     Review of Systems:   Pertinent positives mentioned in HPI. Remainder of all ROS negative.  Physical Exam: There were no vitals filed for this visit. *** Physical Exam  Constitutional: Well-developed, well-nourished, and in no distress.  Eyes: Pupils are equal, round, and reactive to light. EOM are normal.  Cardiovascular: Normal rate and regular rhythm. No murmurs, rubs, or gallops. Pulmonary/Chest: Effort normal. Clear to auscultation bilaterally. No wheezes, rales, or rhonchi. Abdominal: Bowel sounds present. Soft, non-distended, non-tender. Ext: No lower extremity edema. Skin: Warm and dry. No rashes or wounds.   Assessment & Plan:   See Encounters Tab for problem based charting.  Patient {GC/GE:3044014::"discussed with","seen with"} Dr. {NAMES:3044014::"Butcher","Granfortuna","E. Hoffman","Klima","Mullen","Narendra","Raines","Vincent"}   Hospitalized 12/2017 - unintentional weight loss (15lbs in 2 months) - weight at discharge on 01/11/2018: 48.1kg  - increased to 50.7kg at hospital f/u on 12/9  HTN - amlodipine 10mg  daily  etoh abuse  - naltrexone? - magnesium, folic acid  Free T4?  PSA?  Hypertriglyceridemia - repeat lipid panel if patient has been taking these meds  Phenytoin - check level  RUQ pain - [place] 10/31/17: stable 1.2cm diameter focal hyperechoic lesion in the left lateral segment of the liver - MRI liver: mild morphologic changes of cirrhosis

## 2018-03-13 ENCOUNTER — Encounter: Payer: Self-pay | Admitting: Internal Medicine

## 2018-03-20 ENCOUNTER — Emergency Department (HOSPITAL_COMMUNITY)
Admission: EM | Admit: 2018-03-20 | Discharge: 2018-03-20 | Disposition: A | Discharge status: Home / Self Care | Payer: Self-pay | Attending: Emergency Medicine | Admitting: Emergency Medicine

## 2018-03-20 ENCOUNTER — Emergency Department (HOSPITAL_COMMUNITY): Payer: Self-pay

## 2018-03-20 ENCOUNTER — Encounter (HOSPITAL_COMMUNITY): Payer: Self-pay | Admitting: Emergency Medicine

## 2018-03-20 ENCOUNTER — Other Ambulatory Visit: Payer: Self-pay

## 2018-03-20 DIAGNOSIS — Z79899 Other long term (current) drug therapy: Secondary | ICD-10-CM | POA: Insufficient documentation

## 2018-03-20 DIAGNOSIS — F17210 Nicotine dependence, cigarettes, uncomplicated: Secondary | ICD-10-CM | POA: Insufficient documentation

## 2018-03-20 DIAGNOSIS — I10 Essential (primary) hypertension: Secondary | ICD-10-CM | POA: Insufficient documentation

## 2018-03-20 DIAGNOSIS — E785 Hyperlipidemia, unspecified: Secondary | ICD-10-CM | POA: Insufficient documentation

## 2018-03-20 DIAGNOSIS — R519 Headache, unspecified: Secondary | ICD-10-CM

## 2018-03-20 DIAGNOSIS — R51 Headache: Secondary | ICD-10-CM | POA: Insufficient documentation

## 2018-03-20 DIAGNOSIS — E162 Hypoglycemia, unspecified: Secondary | ICD-10-CM | POA: Insufficient documentation

## 2018-03-20 DIAGNOSIS — F1010 Alcohol abuse, uncomplicated: Secondary | ICD-10-CM | POA: Insufficient documentation

## 2018-03-20 LAB — CBC WITH DIFFERENTIAL/PLATELET
Abs Immature Granulocytes: 0.04 10*3/uL (ref 0.00–0.07)
Basophils Absolute: 0.1 10*3/uL (ref 0.0–0.1)
Basophils Relative: 1 %
EOS PCT: 0 %
Eosinophils Absolute: 0 10*3/uL (ref 0.0–0.5)
HCT: 37.9 % — ABNORMAL LOW (ref 39.0–52.0)
HEMOGLOBIN: 12.7 g/dL — AB (ref 13.0–17.0)
Immature Granulocytes: 1 %
LYMPHS PCT: 29 %
Lymphs Abs: 2 10*3/uL (ref 0.7–4.0)
MCH: 31.2 pg (ref 26.0–34.0)
MCHC: 33.5 g/dL (ref 30.0–36.0)
MCV: 93.1 fL (ref 80.0–100.0)
Monocytes Absolute: 0.9 10*3/uL (ref 0.1–1.0)
Monocytes Relative: 14 %
NEUTROS PCT: 55 %
NRBC: 0 % (ref 0.0–0.2)
Neutro Abs: 3.8 10*3/uL (ref 1.7–7.7)
Platelets: 208 10*3/uL (ref 150–400)
RBC: 4.07 MIL/uL — ABNORMAL LOW (ref 4.22–5.81)
RDW: 13.2 % (ref 11.5–15.5)
WBC: 6.9 10*3/uL (ref 4.0–10.5)

## 2018-03-20 LAB — COMPREHENSIVE METABOLIC PANEL
ALT: 34 U/L (ref 0–44)
AST: 130 U/L — AB (ref 15–41)
Albumin: 4 g/dL (ref 3.5–5.0)
Alkaline Phosphatase: 50 U/L (ref 38–126)
Anion gap: 16 — ABNORMAL HIGH (ref 5–15)
BUN: 11 mg/dL (ref 6–20)
CHLORIDE: 100 mmol/L (ref 98–111)
CO2: 25 mmol/L (ref 22–32)
CREATININE: 0.88 mg/dL (ref 0.61–1.24)
Calcium: 8.8 mg/dL — ABNORMAL LOW (ref 8.9–10.3)
GFR calc Af Amer: >60 mL/min (ref >60)
GLUCOSE: 73 mg/dL (ref 70–99)
Potassium: 4 mmol/L (ref 3.5–5.1)
Sodium: 141 mmol/L (ref 135–145)
Total Bilirubin: 0.4 mg/dL (ref 0.3–1.2)
Total Protein: 7.4 g/dL (ref 6.5–8.1)

## 2018-03-20 LAB — ETHANOL: Alcohol, Ethyl (B): 339 mg/dL (ref <10)

## 2018-03-20 LAB — CBG MONITORING, ED
Glucose-Capillary: 59 mg/dL — ABNORMAL LOW (ref 70–99)
Glucose-Capillary: 66 mg/dL — ABNORMAL LOW (ref 70–99)
Glucose-Capillary: 113 mg/dL — ABNORMAL HIGH (ref 70–99)
Glucose-Capillary: 139 mg/dL — ABNORMAL HIGH (ref 70–99)
Glucose-Capillary: 67 mg/dL — ABNORMAL LOW (ref 70–99)
Glucose-Capillary: 98 mg/dL (ref 70–99)

## 2018-03-20 LAB — LIPASE, BLOOD: Lipase: 28 U/L (ref 11–51)

## 2018-03-20 LAB — RAPID URINE DRUG SCREEN, HOSP PERFORMED
Amphetamines: NOT DETECTED
Barbiturates: NOT DETECTED
Benzodiazepines: NOT DETECTED
Cocaine: NOT DETECTED
OPIATES: NOT DETECTED
Tetrahydrocannabinol: NOT DETECTED

## 2018-03-20 LAB — TROPONIN I: TROPONIN I: 0.07 ng/mL — AB (ref <0.03)

## 2018-03-20 MED ORDER — METOCLOPRAMIDE HCL 5 MG/ML IJ SOLN
10.0000 mg | Freq: Once | INTRAMUSCULAR | Status: AC
  Administered 2018-03-20: 10 mg via INTRAVENOUS
  Filled 2018-03-20: qty 2

## 2018-03-20 MED ORDER — DIPHENHYDRAMINE HCL 50 MG/ML IJ SOLN
25.0000 mg | Freq: Once | INTRAMUSCULAR | Status: AC
  Administered 2018-03-20: 25 mg via INTRAVENOUS
  Filled 2018-03-20: qty 1

## 2018-03-20 MED ORDER — LACTATED RINGERS IV BOLUS
1000.0000 mL | Freq: Once | INTRAVENOUS | Status: AC
  Administered 2018-03-20: 1000 mL via INTRAVENOUS

## 2018-03-20 MED ORDER — LORAZEPAM 2 MG/ML IJ SOLN: 2.0000 mg | Freq: Once | INTRAMUSCULAR | Status: DC

## 2018-03-20 MED ORDER — SODIUM CHLORIDE 0.9% FLUSH: 3.0000 mL | INTRAVENOUS | Status: DC | PRN

## 2018-03-20 MED ORDER — IOPAMIDOL (ISOVUE-370) INJECTION 76%
75.0000 mL | Freq: Once | INTRAVENOUS | Status: AC | PRN
  Administered 2018-03-20: 75 mL via INTRAVENOUS

## 2018-03-20 MED ORDER — CHLORDIAZEPOXIDE HCL 25 MG PO CAPS
50.0000 mg | ORAL_CAPSULE | Freq: Once | ORAL | Status: AC
  Administered 2018-03-20: 50 mg via ORAL
  Filled 2018-03-20: qty 2

## 2018-03-20 NOTE — ED Notes (Signed)
Pt. Given orange juice.

## 2018-03-20 NOTE — ED Triage Notes (Signed)
Per EMS: pt here with headache 10/10 pain that began Sunday.  Pt notes dizziness along with HA.  Pt reports alcohol use in the amounts of 2 beers daily along with 1 shot.  Pt on arrival sppears to have mild tremors.

## 2018-03-20 NOTE — ED Notes (Signed)
ED Provider at bedside. 

## 2018-03-20 NOTE — ED Triage Notes (Signed)
Pt given orange juice and a happy meal

## 2018-03-20 NOTE — ED Notes (Signed)
Pt. Transported to xray 

## 2018-03-20 NOTE — ED Provider Notes (Signed)
[facility] [facility] EMERGENCY DEPARTMENT Provider Note   CSN: [id number] Arrival date & time: 03/20/18  6962     History   Chief Complaint Chief Complaint  Patient presents with  . Tremors  . Headache  . Dizziness    HPI Joseph Underwood is a 53 y.o. male.  HPI  53 year old male presents with headache.  He states the headache started 2 days ago and has progressively worsened.  The patient has also been feeling dizzy but today the dizziness was much worse.  He felt like he was going to fall over.  He has chronic blurry vision for several years but no new blurry vision or double vision.  The headache is bitemporal and seems to go into his posterior neck.  No fevers or stiffness.  He states he is right side is always a little weaker than his left and he is left-handed.  He has noticed some chronic weakness in the right lower extremity that is not new today.  He vomited twice today.  He has not take anything for the headache.  He has a history of alcohol abuse and last drank last night.  No chest pain at this time.  He often gets hiccups which he describes as sometimes having shortness of breath associated but no new or worsening shortness of breath.  He has chronic upper abdominal pain that is unchanged.  Past Medical History:  Diagnosis Date  . Alcohol abuse   . Alcohol related seizure ([place])   . Allergy   . Hiccups   . HTN (hypertension)   . Hyperlipidemia     Patient Active Problem List   Diagnosis Date Noted  . Alcohol withdrawal ([place]) 01/10/2018  . Left shoulder pain 05/23/2017  . Erectile dysfunction 04/13/2016  . Preventative health care 04/12/2016  . 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 Tachycardia 03/28/2014  . Liver cirrhosis ([place]) 03/22/2014  . Hypertriglyceridemia 04/24/2013  . Tobacco use 04/24/2013  . Essential hypertension, benign 01/01/2013  . Alcohol use disorder, moderate, dependence ([place]) 04/13/2006  . Alcohol withdrawal seizure ([place])  04/13/2006    Past Surgical History:  Procedure Laterality Date  . NO PAST SURGERIES          Home Medications    Prior to Admission medications   Medication Sig Start Date End Date Taking? Authorizing Provider  amLODipine (NORVASC) 10 MG tablet Take 1 tablet (10 mg total) by mouth daily. 01/22/18   Kalman Shan Ratliff, DO  atorvastatin (LIPITOR) 20 MG tablet Take 1 tablet (20 mg total) by mouth daily. IM program. Patient not taking: Reported on 01/10/2018 04/25/17   Shela Leff, MD  fenofibrate (TRICOR) 145 MG tablet Take 1 tablet (145 mg total) by mouth daily. IM program Patient not taking: Reported on 10/30/2017 04/25/17   Shela Leff, MD  folic acid (FOLVITE) 1 MG tablet Take 1 tablet (1 mg total) by mouth daily. 03/08/18   Dorrell, Andree Elk, MD  magnesium chloride (SLOW-MAG) 64 MG TBEC SR tablet Take 2 tablets (128 mg total) by mouth daily. [facility] Program Patient not taking: Reported on 01/10/2018 10/19/16   Burgess Estelle, MD  naltrexone (DEPADE) 50 MG tablet Take 1 tablet (50 mg total) by mouth daily. IM Program. Patient not taking: Reported on 10/30/2017 04/12/16   Shela Leff, MD  pantoprazole (PROTONIX) 40 MG tablet Take 1 tablet (40 mg total) by mouth daily. Patient not taking: Reported on 01/10/2018 10/31/17   Ward, Delice Bison, DO  phenytoin (DILANTIN) 300  MG ER capsule Take 1 capsule (300 mg total) by mouth daily. Patient not taking: Reported on 01/10/2018 11/01/16   Shela Leff, MD  sildenafil (REVATIO) 20 MG tablet Take 2 tablets (40 mg total) by mouth daily as needed. IM program. Patient not taking: Reported on 01/10/2018 07/05/16   Shela Leff, MD    Family History Family History  Problem Relation Age of Onset  . Hypertension Mother   . Colon cancer Neg Hx   . Esophageal cancer Neg Hx   . Rectal cancer Neg Hx   . Stomach cancer Neg Hx     Social History Social History   Tobacco Use  . Smoking status: Current Every Day  Smoker    Packs/day: 0.01    Years: 37.00    Pack years: 0.37    Types: Cigarettes  . Smokeless tobacco: Never Used  . Tobacco comment: 1 cigs per day  Substance Use Topics  . Alcohol use: Yes    Alcohol/week: 88.0 standard drinks    Types: 14 Cans of beer, 74 Shots of liquor per week    Comment: Beer 40oz 4 days a week  . Drug use: No     Allergies   Patient has no known allergies.   Review of Systems Review of Systems  Constitutional: Negative for fever.  Eyes: Negative for visual disturbance.  Respiratory: Negative for shortness of breath.   Cardiovascular: Negative for chest pain.  Gastrointestinal: Positive for vomiting.  Musculoskeletal: Positive for neck pain. Negative for neck stiffness.  Neurological: Positive for dizziness and headaches. Negative for weakness and numbness.  All other systems reviewed and are negative.    Physical Exam Updated Vital Signs BP (!) 202/120   Pulse (!) 112   Temp 98.6 F (37 C) (Oral)   Resp 11   SpO2 96%   Physical Exam Vitals signs and nursing note reviewed.  Constitutional:      General: He is not in acute distress.    Appearance: He is well-developed. He is not ill-appearing or diaphoretic.  HENT:     Head: Normocephalic and atraumatic.     Right Ear: External ear normal.     Left Ear: External ear normal.     Nose: Nose normal.  Eyes:     General:        Right eye: No discharge.        Left eye: No discharge.     Extraocular Movements: Extraocular movements intact.     Pupils: Pupils are equal, round, and reactive to light.     Comments: Mild nystagmus that fatigues quickly  Neck:     Musculoskeletal: Neck supple.  Cardiovascular:     Rate and Rhythm: Regular rhythm. Tachycardia present.     Heart sounds: Normal heart sounds.  Pulmonary:     Effort: Pulmonary effort is normal.     Breath sounds: Normal breath sounds.  Abdominal:     Palpations: Abdomen is soft.     Tenderness: There is no abdominal  tenderness.  Skin:    General: Skin is warm and dry.  Neurological:     Mental Status: He is alert and oriented to person, place, and time.     Comments: CN 3-12 grossly intact. 5/5 strength in all 4 extremities but slightly weaker in RLE (patient feels this is baseline). Grossly normal sensation. Normal finger to nose.   Psychiatric:        Mood and Affect: Mood is not anxious.  ED Treatments / Results  Labs (all labs ordered are listed, but only abnormal results are displayed) Labs Reviewed  COMPREHENSIVE METABOLIC PANEL - Abnormal; Notable for the following components:      Result Value   Calcium 8.8 (*)    AST 130 (*)    Anion gap 16 (*)    All other components within normal limits  CBC WITH DIFFERENTIAL/PLATELET - Abnormal; Notable for the following components:   RBC 4.07 (*)    Hemoglobin 12.7 (*)    HCT 37.9 (*)    All other components within normal limits  TROPONIN I - Abnormal; Notable for the following components:   Troponin I 0.07 (*)    All other components within normal limits  ETHANOL - Abnormal; Notable for the following components:   Alcohol, Ethyl (B) 339 (*)    All other components within normal limits  CBG MONITORING, ED - Abnormal; Notable for the following components:   Glucose-Capillary 66 (*)    All other components within normal limits  CBG MONITORING, ED - Abnormal; Notable for the following components:   Glucose-Capillary 59 (*)    All other components within normal limits  CBG MONITORING, ED - Abnormal; Notable for the following components:   Glucose-Capillary 113 (*)    All other components within normal limits  CBG MONITORING, ED - Abnormal; Notable for the following components:   Glucose-Capillary 67 (*)    All other components within normal limits  CBG MONITORING, ED - Abnormal; Notable for the following components:   Glucose-Capillary 139 (*)    All other components within normal limits  LIPASE, BLOOD  RAPID URINE DRUG SCREEN, HOSP  PERFORMED  CBG MONITORING, ED    EKG EKG Interpretation  Date/Time:  Tuesday March 20 2018 09:23:56 EST Ventricular Rate:  102 PR Interval:    QRS Duration: 88 QT Interval:  351 QTC Calculation: 458 R Axis:   97 Text Interpretation:  Sinus tachycardia Right atrial enlargement anterior ST elevation and diffuse T wave flattening is similar to Nov 2019 Confirmed by Sherwood Gambler [phone]) on 03/20/2018 9:30:11 AM   Radiology Ct Angio Head W Or Wo Contrast  Result Date: 03/20/2018 CLINICAL DATA:  Severe acute headache. EXAM: CT ANGIOGRAPHY HEAD AND NECK TECHNIQUE: Multidetector CT imaging of the head and neck was performed using the standard protocol during bolus administration of intravenous contrast. Multiplanar CT image reconstructions and MIPs were obtained to evaluate the vascular anatomy. Carotid stenosis measurements (when applicable) are obtained utilizing NASCET criteria, using the distal internal carotid diameter as the denominator. CONTRAST:  39mL ISOVUE-370 IOPAMIDOL (ISOVUE-370) INJECTION 76% COMPARISON:  03/09/2014 FINDINGS: Head CT: Brain: The brain shows a normal appearance without evidence of malformation, atrophy, old or acute small or large vessel infarction, mass lesion, hemorrhage, hydrocephalus or extra-axial collection. Vascular: No hyperdense vessel. No evidence of atherosclerotic calcification. Skull: Normal.  No traumatic finding.  No focal bone lesion. Sinuses/Orbits: Sinuses are clear. Orbits appear normal. Mastoids are clear. Other: None significant CTA NECK FINDINGS Aortic arch: Aortic atherosclerosis.  No aneurysm or dissection. Right carotid system: Common carotid artery is widely patent to the bifurcation. There is calcified plaque of the distal common carotid artery, the carotid bifurcation in the ICA bulb. Minimal diameter of the proximal ICA is 3 mm. Compared to a more distal cervical ICA diameter of 5 mm, this indicates a 40% stenosis. Left carotid system: Common  carotid artery widely patent to the bifurcation region. There is calcified plaque at the distal common  carotid artery, carotid bifurcation and ICA bulb. Minimal diameter of the proximal ICA is 3 mm. Compared to a more distal cervical ICA diameter of 5 mm, this indicates a 40% stenosis. Vertebral arteries: Both vertebral artery origins are widely patent. Both vertebral arteries are widely patent through the cervical region to the foramen magnum. Skeleton: Ordinary cervical spondylosis. Other neck: No mass or lymphadenopathy. Upper chest: Dilated fluid-filled esophagus.  Upper lungs are clear. Review of the MIP images confirms the above findings CTA HEAD FINDINGS Anterior circulation: Both internal carotid arteries are patent through the skull base and siphon regions. There is atherosclerotic calcification in the carotid siphon regions but no stenosis greater than 30%. The anterior and middle cerebral vessels are patent without proximal stenosis, aneurysm or vascular malformation. Posterior circulation: Both vertebral arteries are patent through the foramen magnum to the basilar. No basilar stenosis. Posterior circulation branch vessels are patent. Patent posterior communicating arteries. Venous sinuses: Normal Anatomic variants: None Delayed phase: No abnormal enhancement. No sign of intracranial hemorrhage on this postcontrast exam. Review of the MIP images confirms the above findings IMPRESSION: No acute brain finding.  No evidence of aneurysm or hemorrhage. 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 Maximal stenosis measured at 40% at both internal carotid artery bulb regions. Electronically Signed   By: Nelson Chimes M.D.   On: 03/20/2018 12:39   Dg Chest 2 View  Result Date: 03/20/2018 CLINICAL DATA:  New onset of headache and dizziness for 2 days. EXAM: CHEST - 2 VIEW COMPARISON:  One-view chest x-ray 01/10/2018 FINDINGS: The heart size is upper limits of normal. There is no edema or  effusion. No focal airspace disease is present. Atherosclerotic calcifications are present at the carotid bifurcations and axillary arteries bilaterally. IMPRESSION: No acute cardiopulmonary disease or significant interval change. Electronically Signed   By: San Morelle M.D.   On: 03/20/2018 10:25   Ct Angio Neck W And/or Wo Contrast  Result Date: 03/20/2018 CLINICAL DATA:  Severe acute headache. EXAM: CT ANGIOGRAPHY HEAD AND NECK TECHNIQUE: Multidetector CT imaging of the head and neck was performed using the standard protocol during bolus administration of intravenous contrast. Multiplanar CT image reconstructions and MIPs were obtained to evaluate the vascular anatomy. Carotid stenosis measurements (when applicable) are obtained utilizing NASCET criteria, using the distal internal carotid diameter as the denominator. CONTRAST:  69mL ISOVUE-370 IOPAMIDOL (ISOVUE-370) INJECTION 76% COMPARISON:  03/09/2014 FINDINGS: Head CT: Brain: The brain shows a normal appearance without evidence of malformation, atrophy, old or acute small or large vessel infarction, mass lesion, hemorrhage, hydrocephalus or extra-axial collection. Vascular: No hyperdense vessel. No evidence of atherosclerotic calcification. Skull: Normal.  No traumatic finding.  No focal bone lesion. Sinuses/Orbits: Sinuses are clear. Orbits appear normal. Mastoids are clear. Other: None significant CTA NECK FINDINGS Aortic arch: Aortic atherosclerosis.  No aneurysm or dissection. Right carotid system: Common carotid artery is widely patent to the bifurcation. There is calcified plaque of the distal common carotid artery, the carotid bifurcation in the ICA bulb. Minimal diameter of the proximal ICA is 3 mm. Compared to a more distal cervical ICA diameter of 5 mm, this indicates a 40% stenosis. Left carotid system: Common carotid artery widely patent to the bifurcation region. There is calcified plaque at the distal common carotid artery, carotid  bifurcation and ICA bulb. Minimal diameter of the proximal ICA is 3 mm. Compared to a more distal cervical ICA diameter of 5 mm, this indicates a 40% stenosis. Vertebral arteries: Both vertebral artery origins  are widely patent. Both vertebral arteries are widely patent through the cervical region to the foramen magnum. Skeleton: Ordinary cervical spondylosis. Other neck: No mass or lymphadenopathy. Upper chest: Dilated fluid-filled esophagus.  Upper lungs are clear. Review of the MIP images confirms the above findings CTA HEAD FINDINGS Anterior circulation: Both internal carotid arteries are patent through the skull base and siphon regions. There is atherosclerotic calcification in the carotid siphon regions but no stenosis greater than 30%. The anterior and middle cerebral vessels are patent without proximal stenosis, aneurysm or vascular malformation. Posterior circulation: Both vertebral arteries are patent through the foramen magnum to the basilar. No basilar stenosis. Posterior circulation branch vessels are patent. Patent posterior communicating arteries. Venous sinuses: Normal Anatomic variants: None Delayed phase: No abnormal enhancement. No sign of intracranial hemorrhage on this postcontrast exam. Review of the MIP images confirms the above findings IMPRESSION: No acute brain finding.  No evidence of aneurysm or hemorrhage. 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 Maximal stenosis measured at 40% at both internal carotid artery bulb regions. Electronically Signed   By: Nelson Chimes M.D.   On: 03/20/2018 12:39    Procedures Procedures (including critical care time)  Medications Ordered in ED Medications  sodium chloride flush (NS) 0.9 % injection 3 mL (has no administration in time range)  lactated ringers bolus 1,000 mL (1,000 mLs Intravenous Incomplete 03/20/18 1458)  lactated ringers bolus 1,000 mL (0 mLs Intravenous Stopped 03/20/18 1424)  metoCLOPramide (REGLAN) injection 10  mg (10 mg Intravenous Given 03/20/18 1035)  diphenhydrAMINE (BENADRYL) injection 25 mg (25 mg Intravenous Given 03/20/18 1035)  iopamidol (ISOVUE-370) 76 % injection 75 mL (75 mLs Intravenous Contrast Given 03/20/18 1230)  chlordiazePOXIDE (LIBRIUM) capsule 50 mg (50 mg Oral Given 03/20/18 1601)     Initial Impression / Assessment and Plan / ED Course  I have reviewed the triage vital signs and the nursing notes.  Pertinent labs & imaging results that were available during my care of the patient were reviewed by me and considered in my medical decision making (see chart for details).     The patient initially presents with this headache and dizziness.  This has significantly improved with fluids, Reglan, Benadryl.  Labs are overall near baseline besides mild hyperglycemia and a significantly elevated EtOH.  However he does not clinically appear intoxicated.  Glucose has come up with p.o. fluids but then dropped back down again and then he was given sandwich and has remained up.  He feels much better from a dizziness standpoint and is able to get up and walk.  However he is noted to become more more tachycardic and hypertensive.  I question whether he is starting to develop withdrawal, especially if he truly did not drink since last night.  I discussed this with the patient and that he may need benzodiazepines and possible admission.  However, he strictly declines this and wants to go home.  He understands the possible serious complications of alcohol withdrawal including death.  He appears awake, alert, and capable of making medical decisions.  He will at least let me give him a dose of Librium before he goes. He will leave against medical advise. He is encouraged to return at any time and to otherwise follow up with his PCP. We discussed the danger of trying to detox himself without medical supervision, but at this time he has no intention of stopping ETOH.  Final Clinical Impressions(s) / ED Diagnoses    Final diagnoses:  Temporal headache  Alcohol abuse  Hypoglycemia    ED Discharge Orders    None       Sherwood Gambler, MD 03/20/18 [phone]

## 2018-03-20 NOTE — Discharge Instructions (Addendum)
We discussed that some of your symptoms today could be due to alcohol withdrawal.  You were offered medicine here but declined.  If you develop any new or worsening symptoms such as severe headache, vomiting, dizziness, seeing or feeling or hearing things that are not there, or any other new/concerning symptoms you should return to the hospital immediately or call 911.  Otherwise follow-up closely with your primary care physician.

## 2018-03-20 NOTE — ED Triage Notes (Signed)
PT ambulated in hall unassisted tol well.

## 2018-03-20 NOTE — ED Notes (Signed)
Pt aware we need urine for a specimen.  

## 2018-06-28 ENCOUNTER — Emergency Department (HOSPITAL_COMMUNITY): Payer: Self-pay

## 2018-06-28 ENCOUNTER — Encounter (HOSPITAL_COMMUNITY): Payer: Self-pay | Admitting: Emergency Medicine

## 2018-06-28 ENCOUNTER — Other Ambulatory Visit: Payer: Self-pay

## 2018-06-28 ENCOUNTER — Emergency Department (HOSPITAL_COMMUNITY)
Admission: EM | Admit: 2018-06-28 | Discharge: 2018-06-28 | Disposition: A | Discharge status: Home / Self Care | Payer: Self-pay | Attending: Emergency Medicine | Admitting: Emergency Medicine

## 2018-06-28 DIAGNOSIS — I10 Essential (primary) hypertension: Secondary | ICD-10-CM | POA: Insufficient documentation

## 2018-06-28 DIAGNOSIS — K2289 Other specified disease of esophagus: Secondary | ICD-10-CM

## 2018-06-28 DIAGNOSIS — I700 Atherosclerosis of aorta: Secondary | ICD-10-CM | POA: Insufficient documentation

## 2018-06-28 DIAGNOSIS — K611 Rectal abscess: Secondary | ICD-10-CM | POA: Insufficient documentation

## 2018-06-28 DIAGNOSIS — Z79899 Other long term (current) drug therapy: Secondary | ICD-10-CM | POA: Insufficient documentation

## 2018-06-28 DIAGNOSIS — F17210 Nicotine dependence, cigarettes, uncomplicated: Secondary | ICD-10-CM | POA: Insufficient documentation

## 2018-06-28 DIAGNOSIS — K228 Other specified diseases of esophagus: Secondary | ICD-10-CM | POA: Insufficient documentation

## 2018-06-28 DIAGNOSIS — K7460 Unspecified cirrhosis of liver: Secondary | ICD-10-CM | POA: Insufficient documentation

## 2018-06-28 LAB — CBC WITH DIFFERENTIAL/PLATELET
Abs Immature Granulocytes: 0.13 10*3/uL — ABNORMAL HIGH (ref 0.00–0.07)
Basophils Absolute: 0.1 10*3/uL (ref 0.0–0.1)
Basophils Relative: 0 %
Eosinophils Absolute: 0 10*3/uL (ref 0.0–0.5)
Eosinophils Relative: 0 %
HCT: 33.7 % — ABNORMAL LOW (ref 39.0–52.0)
Hemoglobin: 11.9 g/dL — ABNORMAL LOW (ref 13.0–17.0)
Immature Granulocytes: 1 %
Lymphocytes Relative: 17 %
Lymphs Abs: 2.5 10*3/uL (ref 0.7–4.0)
MCH: 33.1 pg (ref 26.0–34.0)
MCHC: 35.3 g/dL (ref 30.0–36.0)
MCV: 93.9 fL (ref 80.0–100.0)
Monocytes Absolute: 1.5 10*3/uL — ABNORMAL HIGH (ref 0.1–1.0)
Monocytes Relative: 10 %
Neutro Abs: 10.6 10*3/uL — ABNORMAL HIGH (ref 1.7–7.7)
Neutrophils Relative %: 72 %
Platelets: 186 10*3/uL (ref 150–400)
RBC: 3.59 MIL/uL — ABNORMAL LOW (ref 4.22–5.81)
RDW: 13.4 % (ref 11.5–15.5)
WBC: 14.8 10*3/uL — ABNORMAL HIGH (ref 4.0–10.5)
nRBC: 0 % (ref 0.0–0.2)

## 2018-06-28 LAB — BASIC METABOLIC PANEL
Anion gap: 15 (ref 5–15)
BUN: 11 mg/dL (ref 6–20)
CO2: 25 mmol/L (ref 22–32)
Calcium: 8.8 mg/dL — ABNORMAL LOW (ref 8.9–10.3)
Chloride: 93 mmol/L — ABNORMAL LOW (ref 98–111)
Creatinine, Ser: 0.77 mg/dL (ref 0.61–1.24)
GFR calc Af Amer: >60 mL/min (ref >60)
GFR calc non Af Amer: >60 mL/min (ref >60)
Glucose, Bld: 83 mg/dL (ref 70–99)
Potassium: 3.4 mmol/L — ABNORMAL LOW (ref 3.5–5.1)
Sodium: 133 mmol/L — ABNORMAL LOW (ref 135–145)

## 2018-06-28 MED ORDER — SODIUM CHLORIDE 0.9 % IV BOLUS
1000.0000 mL | Freq: Once | INTRAVENOUS | Status: AC
  Administered 2018-06-28: 1000 mL via INTRAVENOUS

## 2018-06-28 MED ORDER — LIDOCAINE-EPINEPHRINE-TETRACAINE (LET) SOLUTION
3.0000 mL | Freq: Once | NASAL | Status: AC
  Administered 2018-06-28: 3 mL via TOPICAL
  Filled 2018-06-28: qty 3

## 2018-06-28 MED ORDER — AMOXICILLIN-POT CLAVULANATE 875-125 MG PO TABS: 1.0000 | ORAL_TABLET | Freq: Two times a day (BID) | ORAL | 0 refills | Status: AC

## 2018-06-28 MED ORDER — IOHEXOL 300 MG/ML  SOLN
100.0000 mL | Freq: Once | INTRAMUSCULAR | Status: AC | PRN
  Administered 2018-06-28: 100 mL via INTRAVENOUS

## 2018-06-28 MED ORDER — LIDOCAINE-EPINEPHRINE (PF) 2 %-1:200000 IJ SOLN
10.0000 mL | Freq: Once | INTRAMUSCULAR | Status: AC
  Administered 2018-06-28: 10 mL via INTRADERMAL
  Filled 2018-06-28: qty 20

## 2018-06-28 MED ORDER — AMOXICILLIN-POT CLAVULANATE 875-125 MG PO TABS
1.0000 | ORAL_TABLET | Freq: Once | ORAL | Status: AC
  Administered 2018-06-28: 1 via ORAL
  Filled 2018-06-28: qty 1

## 2018-06-28 NOTE — ED Notes (Signed)
Discharge instructions and prescriptions discussed with Pt. Pt verbalized understanding. Pt stable and ambulatory.   

## 2018-06-28 NOTE — Discharge Instructions (Signed)
Your CT scan today showed a perirectal abscess.  It also showed some changes in your esophagus and part of your duodenum which she will need to follow-up with your primary care doctor about.  It also showed changes to your liver, you will need to follow-up with your primary care doctor about this as well.  You also had some calcification to your aorta.  You were given an antibiotic to help treat the abscess.  You were given a referral to general surgery to follow-up with on Monday, 07/02/2018.  If in the meantime you develop fevers, increased pain or any new or worsening symptoms any return to the emergency department immediately.

## 2018-06-28 NOTE — ED Triage Notes (Signed)
Pt arrives to ED from home with complaints of a cyst in the middle of his buttocks that appeared four days ago that has gotten bugger and more painful.

## 2018-06-28 NOTE — ED Notes (Signed)
Patient transported to CT 

## 2018-06-28 NOTE — ED Provider Notes (Signed)
[facility]ERG[facility] DEPARTMENT Provider Note   CSN: [id number] Arrival date & time: 06/28/18  9242    History   Chief Complaint Chief Complaint  Patient presents with  . Pilonidal Cyst    HPI Joseph Underwood is a 53 y.o. male.     HPI   Patient is a 53 year old male with a history of alcohol abuse, alcohol-related seizures, hypertension, hyperlipidemia, cirrhosis, who presents to the emergency department today for evaluation of abscess to the right lower buttocks.  Abscess has been present for the last 4 days.  States it has been getting bigger and more painful however today it started draining and now is somewhat smaller than it was previously.  Pain is constant and severe in nature.  It is worse with palpation.  He denies any fevers.  When asked about his elevated heart rate he states that he has a history of chronic tachycardia.  He denies any other symptoms at this time.    Past Medical History:  Diagnosis Date  . Alcohol abuse   . Alcohol related seizure ([place])   . Allergy   . Hiccups   . HTN (hypertension)   . Hyperlipidemia     Patient Active Problem List   Diagnosis Date Noted  . Alcohol withdrawal ([place]) 01/10/2018  . Left shoulder pain 05/23/2017  . Erectile dysfunction 04/13/2016  . Preventative health care 04/12/2016  . 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 Tachycardia 03/28/2014  . Liver cirrhosis ([place]) 03/22/2014  . Hypertriglyceridemia 04/24/2013  . Tobacco use 04/24/2013  . Essential hypertension, benign 01/01/2013  . Alcohol use disorder, moderate, dependence ([place]) 04/13/2006  . Alcohol withdrawal seizure ([place]) 04/13/2006    Past Surgical History:  Procedure Laterality Date  . NO PAST SURGERIES          Home Medications    Prior to Admission medications   Medication Sig Start Date End Date Taking? Authorizing Provider  amLODipine (NORVASC) 10 MG tablet Take 1 tablet (10 mg total) by mouth daily. 01/22/18    Kalman Shan Ratliff, DO  amoxicillin-clavulanate (AUGMENTIN) 875-125 MG tablet Take 1 tablet by mouth every 12 (twelve) hours for 7 days. 06/28/18 07/05/18  Shalah Estelle S, PA-C  atorvastatin (LIPITOR) 20 MG tablet Take 1 tablet (20 mg total) by mouth daily. IM program. Patient not taking: Reported on 01/10/2018 04/25/17   Shela Leff, MD  fenofibrate (TRICOR) 145 MG tablet Take 1 tablet (145 mg total) by mouth daily. IM program Patient not taking: Reported on 10/30/2017 04/25/17   Shela Leff, MD  folic acid (FOLVITE) 1 MG tablet Take 1 tablet (1 mg total) by mouth daily. 03/08/18   Dorrell, Andree Elk, MD  magnesium chloride (SLOW-MAG) 64 MG TBEC SR tablet Take 2 tablets (128 mg total) by mouth daily. [facility] Patient not taking: Reported on 01/10/2018 10/19/16   Burgess Estelle, MD  naltrexone (DEPADE) 50 MG tablet Take 1 tablet (50 mg total) by mouth daily. IM Program. Patient not taking: Reported on 10/30/2017 04/12/16   Shela Leff, MD  pantoprazole (PROTONIX) 40 MG tablet Take 1 tablet (40 mg total) by mouth daily. Patient not taking: Reported on 01/10/2018 10/31/17   Ward, Delice Bison, DO  phenytoin (DILANTIN) 300 MG ER capsule Take 1 capsule (300 mg total) by mouth daily. Patient not taking: Reported on 01/10/2018 11/01/16   Shela Leff, MD  sildenafil (REVATIO) 20 MG tablet Take 2 tablets (40 mg total) by mouth daily as needed. IM program. Patient  not taking: Reported on 01/10/2018 07/05/16   Shela Leff, MD    Family History Family History  Problem Relation Age of Onset  . Hypertension Mother   . Colon cancer Neg Hx   . Esophageal cancer Neg Hx   . Rectal cancer Neg Hx   . Stomach cancer Neg Hx     Social History Social History   Tobacco Use  . Smoking status: Current Every Day Smoker    Packs/day: 0.01    Years: 37.00    Pack years: 0.37    Types: Cigarettes  . Smokeless tobacco: Never Used  . Tobacco comment: 1 cigs per day   Substance Use Topics  . Alcohol use: Yes    Alcohol/week: 88.0 standard drinks    Types: 14 Cans of beer, 74 Shots of liquor per week    Comment: Beer 40oz 4 days a week  . Drug use: No     Allergies   Patient has no known allergies.   Review of Systems Review of Systems  Constitutional: Negative for fever.  HENT: Negative for congestion.   Eyes: Negative for visual disturbance.  Respiratory: Negative for shortness of breath.   Cardiovascular: Negative for chest pain.       Chronic tachycardia  Gastrointestinal: Negative for abdominal pain, nausea and vomiting.       Rectal pain/abscess  Genitourinary: Negative for dysuria.  Musculoskeletal: Negative for myalgias.  Skin:       abscess  Neurological: Negative for headaches.     Physical Exam Updated Vital Signs BP (!) 151/101   Pulse (!) 113   Temp 98.5 F (36.9 C) (Oral)   Resp 18   Ht 5\' 5"  (1.651 m)   Wt 54.4 kg   SpO2 98%   BMI 19.97 kg/m   Physical Exam Vitals signs and nursing note reviewed.  Constitutional:      General: He is not in acute distress.    Appearance: He is well-developed. He is not ill-appearing or toxic-appearing.  HENT:     Head: Normocephalic and atraumatic.  Eyes:     Conjunctiva/sclera: Conjunctivae normal.  Neck:     Musculoskeletal: Neck supple.  Cardiovascular:     Rate and Rhythm: Regular rhythm. Tachycardia present.     Pulses: Normal pulses.     Heart sounds: Normal heart sounds. No murmur.  Pulmonary:     Effort: Pulmonary effort is normal.     Breath sounds: Normal breath sounds. No wheezing, rhonchi or rales.  Abdominal:     General: Bowel sounds are normal.     Palpations: Abdomen is soft.     Tenderness: There is no abdominal tenderness.  Genitourinary:    Comments: Chaperone present during exam of the anal rectal area.  Patient has a 1.5 cm x 2 cm area of fluctuance just lateral to the rectum that is actively draining purulent fluid.  Digital rectal exam  performed, patient does have tenderness to the right and left aspects of the rectum.  I do not feel overt area of fluctuance within the rectum but difficult to assess due to patient's pain. Skin:    General: Skin is warm and dry.  Neurological:     Mental Status: He is alert.      ED Treatments / Results  Labs (all labs ordered are listed, but only abnormal results are displayed) Labs Reviewed  CBC WITH DIFFERENTIAL/PLATELET - Abnormal; Notable for the following components:      Result Value   WBC  14.8 (*)    RBC 3.59 (*)    Hemoglobin 11.9 (*)    HCT 33.7 (*)    Neutro Abs 10.6 (*)    Monocytes Absolute 1.5 (*)    Abs Immature Granulocytes 0.13 (*)    All other components within normal limits  BASIC METABOLIC PANEL - Abnormal; Notable for the following components:   Sodium 133 (*)    Potassium 3.4 (*)    Chloride 93 (*)    Calcium 8.8 (*)    All other components within normal limits    EKG None  Radiology Ct Abdomen Pelvis W Contrast  Result Date: 06/28/2018 CLINICAL DATA:  Patient reports ?cyst in middle of buttocks ?Marland Kitchen Present for 4 days, increasing size and more painful. EXAM: CT ABDOMEN AND PELVIS WITH CONTRAST TECHNIQUE: Multidetector CT imaging of the abdomen and pelvis was performed using the standard protocol following bolus administration of intravenous contrast. CONTRAST:  170mL OMNIPAQUE IOHEXOL 300 MG/ML  SOLN COMPARISON:  CT 03/13/2014 FINDINGS: Lower chest: Dilated distal esophagus with wall thickening. No acute airspace disease or pleural effusion. Hepatobiliary: Nodular hepatic contours consistent with cirrhosis. Focal fatty infiltration adjacent to the falciform ligament without suspicious focal lesion. Gallbladder physiologically distended, no calcified stone. No biliary dilatation. Pancreas: No ductal dilatation or inflammation. Spleen: Small in size spanning 5.1 cm. No focal abnormality. Adrenals/Urinary Tract: Normal adrenal glands. No hydronephrosis or  perinephric edema. Homogeneous renal enhancement with symmetric excretion on delayed phase imaging. 15 mm septated cyst in the left central kidney. Urinary bladder is physiologically distended without wall thickening. Stomach/Bowel: Lack of enteric contrast and paucity of intra-abdominal fat limits assessment. Wall thickening and dilatation of the distal esophagus. Stomach is nondistended. Equivocal wall thickening of the duodenum. No evidence of bowel obstruction, inflammatory change, or wall thickening. Normal appendix. Left perirectal air fluid collection measures approximately 2.7 x 2.0 x 2.5 cm. Surrounding inflammatory change in fat stranding. Vascular/Lymphatic: Aorto bi-iliac atherosclerosis. Circumaortic left renal vein. Portal vein and mesenteric vessels are patent. No enlarged lymph nodes in the abdomen or pelvis. Few prominent lymph nodes in the left inguinal region are likely reactive. Reproductive: Prostate is unremarkable. Other: No free air or free fluid. Musculoskeletal: There are no acute or suspicious osseous abnormalities. Scattered degenerative change in the spine. IMPRESSION: 1. Left perirectal abscess measures 2.7 x 2.0 x 2.5 cm. 2. Cirrhosis. 3. Dilated distal esophagus with wall thickening, may be secondary to reflux or achalasia. 4. Slight wall thickening of the duodenum is nonspecific, recommend correlation for peptic ulcer disease symptoms. 5.  Aortic Atherosclerosis (ICD10-I70.0). Electronically Signed   By: Keith Rake M.D.   On: 06/28/2018 20:56    Procedures Procedures (including critical care time)  Medications Ordered in ED Medications  amoxicillin-clavulanate (AUGMENTIN) 875-125 MG per tablet 1 tablet (has no administration in time range)  lidocaine-EPINEPHrine (XYLOCAINE W/EPI) 2 %-1:200000 (PF) injection 10 mL (10 mLs Intradermal Given by Other 06/28/18 2106)  sodium chloride 0.9 % bolus 1,000 mL (0 mLs Intravenous Stopped 06/28/18 2107)  iohexol (OMNIPAQUE) 300  MG/ML solution 100 mL (100 mLs Intravenous Contrast Given 06/28/18 2024)  lidocaine-EPINEPHrine-tetracaine (LET) solution (3 mLs Topical Given 06/28/18 2106)     Initial Impression / Assessment and Plan / ED Course  I have reviewed the triage vital signs and the nursing notes.  Pertinent labs & imaging results that were available during my care of the patient were reviewed by me and considered in my medical decision making (see chart for details).  Final Clinical Impressions(s) / ED Diagnoses   Final diagnoses:  Perirectal abscess  Aortic atherosclerosis ([place])  Cirrhosis of liver without ascites, unspecified hepatic cirrhosis type ([place])  Esophageal thickening   Patient presenting with abscess just lateral to the rectum that has been present for the last 4 to 5 days.  It started draining today.  Patient denies any fevers at home.  On exam patient is initially tachycardic and somewhat hypertensive.  He is not febrile.  I personally rechecked his temperature he was not febrile.  He states that his tachycardia is chronic.  On chart review, this does appear to be true.  I also think that this may be related to his alcohol use.  He has not had a drink since 3 PM today.  CIWA 1. He is nontoxic and nonseptic appearing.   CBC shows a leukocytosis of 14.  Anemia is present but appears stable.  BMP with mild hyponatremia, hypokalemia and hypochloremia.  Kidney function is normal.  CT of the abdomen pelvis ordered for further evaluation of his abscess which did show evidence of Left perirectal abscess measures 2.7 x 2.0 x 2.5 cm.   9:22 PM CONSULT with Dr. Brantley Stage with general surgery. States that since the abscess is currently draining that pt can be started on augmentin, complete warm soaks at home and f/u in the office on Monday .  I discussed the results with patient.  Discussed plan to start him on antibiotics and have him follow-up in the office.  Gave first dose of Augmentin today.  Advised to  return to the ER for any new or worsening symptoms in the meantime.  He voices understanding of the plan and reasons return the ED.  All questions answered.  Patient stable for discharge.  ED Discharge Orders         Ordered    amoxicillin-clavulanate (AUGMENTIN) 875-125 MG tablet  Every 12 hours     06/28/18 2144           Rodney Booze, PA-C 06/28/18 2145    Julianne Rice, MD 06/30/18 [phone]

## 2018-07-24 ENCOUNTER — Telehealth: Payer: Self-pay | Admitting: *Deleted

## 2018-07-25 ENCOUNTER — Other Ambulatory Visit: Payer: Self-pay

## 2018-07-25 ENCOUNTER — Ambulatory Visit (INDEPENDENT_AMBULATORY_CARE_PROVIDER_SITE_OTHER): Payer: Self-pay | Admitting: Internal Medicine

## 2018-07-25 DIAGNOSIS — F10939 Alcohol use, unspecified with withdrawal, unspecified: Secondary | ICD-10-CM

## 2018-07-25 DIAGNOSIS — K611 Rectal abscess: Secondary | ICD-10-CM

## 2018-07-25 DIAGNOSIS — R000 Tachycardia, unspecified: Secondary | ICD-10-CM

## 2018-07-25 DIAGNOSIS — R569 Unspecified convulsions: Secondary | ICD-10-CM

## 2018-07-25 DIAGNOSIS — F10239 Alcohol dependence with withdrawal, unspecified: Secondary | ICD-10-CM

## 2018-07-25 MED ORDER — PHENYTOIN SODIUM EXTENDED 300 MG PO CAPS: 300.0000 mg | ORAL_CAPSULE | Freq: Every day | ORAL | 3 refills | Status: DC

## 2018-07-25 MED FILL — PHENYTOIN SOD EXT 300 MG CA: 300 | 30 days supply | Qty: 30 | Fill #0

## 2018-07-25 NOTE — Patient Instructions (Addendum)
Thank you for allowing [place] to care for you  For your recent seizure - This is likely due to your running out of medication - Refill ordered for Dilantin, please pick this up today  Glad to hear your abscess has healed well  Follow up with PCP in 1-2 months

## 2018-07-25 NOTE — Progress Notes (Signed)
   CC: ED follow up, Seizure  HPI:   [name] is a 53 y.o. M with PMHx listed below presenting for ED follow up, Seizure. Please see the A&P for the status of the patient's chronic medical problems.  Past Medical History:  Diagnosis Date  . Alcohol abuse   . Alcohol related seizure ([place])   . Allergy   . Hiccups   . HTN (hypertension)   . Hyperlipidemia    Review of Systems:  Performed and all others negative.  Physical Exam:  Vitals:   07/25/18 1514  BP: (!) 158/87  Pulse: (!) 122  Temp: 99 F (37.2 C)  TempSrc: Oral  SpO2: 98%  Weight: 109 lb 6.4 oz (49.6 kg)  Height: 5\' 5"  (1.651 m)   Physical Exam Constitutional:      General: He is not in acute distress.    Appearance: Normal appearance.     Comments: Thin male, appears frail  Cardiovascular:     Rate and Rhythm: Regular rhythm. Tachycardia present.     Pulses: Normal pulses.     Heart sounds: Normal heart sounds.     Comments: HR ~116 Pulmonary:     Effort: Pulmonary effort is normal. No respiratory distress.     Breath sounds: Normal breath sounds.  Abdominal:     General: Bowel sounds are normal. There is no distension.     Palpations: Abdomen is soft.     Tenderness: There is no abdominal tenderness.  Musculoskeletal:        General: No swelling or deformity.  Skin:    General: Skin is warm and dry.  Neurological:     General: No focal deficit present.     Mental Status: Mental status is at baseline.     Comments: Somewhat slow to answer questions, but answers appropriately    Assessment & Plan:   See Encounters Tab for problem based charting.  Patient discussed with Dr. Daryll Drown

## 2018-07-26 ENCOUNTER — Encounter: Payer: Self-pay | Admitting: Internal Medicine

## 2018-07-26 DIAGNOSIS — K611 Rectal abscess: Secondary | ICD-10-CM | POA: Insufficient documentation

## 2018-07-26 NOTE — Assessment & Plan Note (Signed)
Patient has a history of seizures, believed to be related to alcohol withdrawal. He has been on phenytoin for seizure prophylaxis. He has breakthrough seizures with decreased drinking when off phenytoin, per prior records.  He reports a seizure 2 days ago in the setting of being out of his phenytoin for 3 weeks. He was on his wasy to work and stopped to get something to eat. He was eating in his car and next thing he knew he woke up with someone tapping on his window. He states he does not know how long he was out and her does not recall confusion upon waking, but he does report that he bit his tongue; which often happens with his seizures. He has had no episodes since.  He seems to do well when on phenytoin, will refill this and have patient follow up if any further breakthrough seizures occur. - Refill phenytoin 300mg  ER Daily - Plan for PCP follow up in 1-2 months

## 2018-07-26 NOTE — Assessment & Plan Note (Addendum)
Tachycardic today. Initially in 120s improved to 110s. This is a chronic and stable problem for him. His heart rate at doctor visits is typically 100s-110s. He denies palpitations, Chest pain, or SOB. He has had multiple EKGs showing sinus tachycardia. - Continue to monitor

## 2018-07-26 NOTE — Assessment & Plan Note (Signed)
Patient presented to the ED on 5/14 with a perirectal abscess and was found to have a leukocytosis. He was seen by general surgery in the ED. Because the cyst was draining he was started on Augmentin and a follow up was scheduled in general surgery's office a couple of days later.   At his follow up his abscess was further incised, drained and packed. He was also instructed to complete his antibiotic course. He has completed his antibiotics and he report the abscess has drained and resolved.

## 2018-07-30 NOTE — Progress Notes (Signed)
Internal Medicine Clinic Attending  Case discussed with Dr. Melvin soon after the resident saw the patient.  We reviewed the resident's history and exam and pertinent patient test results.  I agree with the assessment, diagnosis, and plan of care documented in the resident's note.   

## 2018-08-05 ENCOUNTER — Encounter: Payer: Self-pay | Admitting: *Deleted

## 2018-08-08 ENCOUNTER — Ambulatory Visit: Payer: Self-pay

## 2018-09-04 ENCOUNTER — Encounter: Payer: Self-pay | Admitting: Internal Medicine

## 2018-12-04 NOTE — Telephone Encounter (Signed)
Review meds 

## 2019-01-06 ENCOUNTER — Emergency Department (HOSPITAL_COMMUNITY): Payer: Self-pay

## 2019-01-06 ENCOUNTER — Emergency Department (HOSPITAL_COMMUNITY)
Admission: EM | Admit: 2019-01-06 | Discharge: 2019-01-07 | Disposition: A | Discharge status: Home / Self Care | Payer: Self-pay | Attending: Emergency Medicine | Admitting: Emergency Medicine

## 2019-01-06 ENCOUNTER — Other Ambulatory Visit: Payer: Self-pay

## 2019-01-06 ENCOUNTER — Encounter (HOSPITAL_COMMUNITY): Payer: Self-pay

## 2019-01-06 DIAGNOSIS — Z79899 Other long term (current) drug therapy: Secondary | ICD-10-CM | POA: Insufficient documentation

## 2019-01-06 DIAGNOSIS — F10920 Alcohol use, unspecified with intoxication, uncomplicated: Secondary | ICD-10-CM | POA: Insufficient documentation

## 2019-01-06 DIAGNOSIS — K2920 Alcoholic gastritis without bleeding: Secondary | ICD-10-CM | POA: Insufficient documentation

## 2019-01-06 DIAGNOSIS — F17210 Nicotine dependence, cigarettes, uncomplicated: Secondary | ICD-10-CM | POA: Insufficient documentation

## 2019-01-06 DIAGNOSIS — R569 Unspecified convulsions: Secondary | ICD-10-CM | POA: Insufficient documentation

## 2019-01-06 DIAGNOSIS — I10 Essential (primary) hypertension: Secondary | ICD-10-CM | POA: Insufficient documentation

## 2019-01-06 DIAGNOSIS — R109 Unspecified abdominal pain: Secondary | ICD-10-CM | POA: Insufficient documentation

## 2019-01-06 LAB — COMPREHENSIVE METABOLIC PANEL
ALT: 47 U/L — ABNORMAL HIGH (ref 0–44)
AST: 190 U/L — ABNORMAL HIGH (ref 15–41)
Albumin: 3.4 g/dL — ABNORMAL LOW (ref 3.5–5.0)
Alkaline Phosphatase: 112 U/L (ref 38–126)
Anion gap: 15 (ref 5–15)
BUN: 9 mg/dL (ref 6–20)
CO2: 24 mmol/L (ref 22–32)
Calcium: 8.3 mg/dL — ABNORMAL LOW (ref 8.9–10.3)
Chloride: 101 mmol/L (ref 98–111)
Creatinine, Ser: 0.62 mg/dL (ref 0.61–1.24)
GFR calc Af Amer: >60 mL/min (ref >60)
GFR calc non Af Amer: >60 mL/min (ref >60)
Glucose, Bld: 86 mg/dL (ref 70–99)
Potassium: 4.6 mmol/L (ref 3.5–5.1)
Sodium: 140 mmol/L (ref 135–145)
Total Bilirubin: 0.8 mg/dL (ref 0.3–1.2)
Total Protein: 6.9 g/dL (ref 6.5–8.1)

## 2019-01-06 LAB — URINALYSIS, ROUTINE W REFLEX MICROSCOPIC
Bacteria, UA: NONE SEEN
Bilirubin Urine: NEGATIVE
Glucose, UA: NEGATIVE mg/dL
Ketones, ur: NEGATIVE mg/dL
Leukocytes,Ua: NEGATIVE
Nitrite: NEGATIVE
Protein, ur: 100 mg/dL — AB
Specific Gravity, Urine: 1.006 (ref 1.005–1.030)
pH: 5 (ref 5.0–8.0)

## 2019-01-06 LAB — CBC WITH DIFFERENTIAL/PLATELET
Abs Immature Granulocytes: 0.04 10*3/uL (ref 0.00–0.07)
Basophils Absolute: 0.1 10*3/uL (ref 0.0–0.1)
Basophils Relative: 1 %
Eosinophils Absolute: 0 10*3/uL (ref 0.0–0.5)
Eosinophils Relative: 0 %
HCT: 35.4 % — ABNORMAL LOW (ref 39.0–52.0)
Hemoglobin: 12.2 g/dL — ABNORMAL LOW (ref 13.0–17.0)
Immature Granulocytes: 1 %
Lymphocytes Relative: 33 %
Lymphs Abs: 1.7 10*3/uL (ref 0.7–4.0)
MCH: 34.6 pg — ABNORMAL HIGH (ref 26.0–34.0)
MCHC: 34.5 g/dL (ref 30.0–36.0)
MCV: 100.3 fL — ABNORMAL HIGH (ref 80.0–100.0)
Monocytes Absolute: 0.6 10*3/uL (ref 0.1–1.0)
Monocytes Relative: 11 %
Neutro Abs: 2.8 10*3/uL (ref 1.7–7.7)
Neutrophils Relative %: 54 %
Platelets: 159 10*3/uL (ref 150–400)
RBC: 3.53 MIL/uL — ABNORMAL LOW (ref 4.22–5.81)
RDW: 13.5 % (ref 11.5–15.5)
WBC: 5.2 10*3/uL (ref 4.0–10.5)
nRBC: 0 % (ref 0.0–0.2)

## 2019-01-06 LAB — ETHANOL: Alcohol, Ethyl (B): 357 mg/dL (ref <10)

## 2019-01-06 LAB — LIPASE, BLOOD: Lipase: 38 U/L (ref 11–51)

## 2019-01-06 MED ORDER — ONDANSETRON HCL 4 MG/2ML IJ SOLN
4.0000 mg | Freq: Once | INTRAMUSCULAR | Status: AC
  Administered 2019-01-06: 4 mg via INTRAVENOUS
  Filled 2019-01-06: qty 2

## 2019-01-06 MED ORDER — FENTANYL CITRATE (PF) 100 MCG/2ML IJ SOLN
50.0000 ug | Freq: Once | INTRAMUSCULAR | Status: AC
  Administered 2019-01-06: 50 ug via INTRAVENOUS
  Filled 2019-01-06: qty 2

## 2019-01-06 MED ORDER — THIAMINE HCL 100 MG/ML IJ SOLN
100.0000 mg | Freq: Once | INTRAMUSCULAR | Status: AC
  Administered 2019-01-06: 100 mg via INTRAVENOUS
  Filled 2019-01-06: qty 2

## 2019-01-06 MED ORDER — IOHEXOL 300 MG/ML  SOLN
30.0000 mL | Freq: Once | INTRAMUSCULAR | Status: AC | PRN
  Administered 2019-01-06: 30 mL via ORAL

## 2019-01-06 MED ORDER — IOHEXOL 300 MG/ML  SOLN
100.0000 mL | Freq: Once | INTRAMUSCULAR | Status: AC | PRN
  Administered 2019-01-06: 100 mL via INTRAVENOUS

## 2019-01-06 MED ORDER — SODIUM CHLORIDE 0.9 % IV BOLUS
1000.0000 mL | Freq: Once | INTRAVENOUS | Status: AC
  Administered 2019-01-06: 1000 mL via INTRAVENOUS

## 2019-01-06 NOTE — ED Provider Notes (Signed)
I assumed care of patient at shift change, please see note by previous team for full H&P.  Briefly patient is here for nausea and abdominal pain.  He is intoxicated.  Physical Exam  BP (!) 165/118   Pulse (!) 111   Temp 98.7 F (37.1 C) (Oral)   Resp 16   SpO2 97%   Physical Exam Vitals signs and nursing note reviewed.  Constitutional:      General: He is not in acute distress. Neurological:     Mental Status: He is alert.     Motor: No weakness.  Psychiatric:        Mood and Affect: Mood normal.        Behavior: Behavior normal.     ED Course/Procedures     Procedures   Ct Head Wo Contrast  Result Date: 01/06/2019 CLINICAL DATA:  Pt BIB EMS from home. Pt reports right side abdominal pain, constipation, and N/V x2 weeks. Pt reports an intermittent protruding knot on right lower abdomen. Pt reports last bowel movement today. EXAM: CT HEAD WITHOUT CONTRAST TECHNIQUE: Contiguous axial images were obtained from the base of the skull through the vertex without intravenous contrast. COMPARISON:  03/20/2018 FINDINGS: Brain: No evidence of acute infarction, hemorrhage, hydrocephalus, extra-axial collection or mass lesion/mass effect. There is ventricular sulcal enlargement reflecting atrophy advanced for age. Mild white matter hypoattenuation is also present consistent with chronic microvascular ischemic change. Vascular: No hyperdense vessel or unexpected calcification. Skull: Normal. Negative for fracture or focal lesion. Sinuses/Orbits: No acute finding. Other: None. IMPRESSION: 1. No acute intracranial abnormalities. 2. Atrophy advanced for age. Mild chronic microvascular ischemic change. Electronically Signed   By: Lajean Manes M.D.   On: 01/06/2019 20:26   Ct Abdomen Pelvis W Contrast  Result Date: 01/06/2019 CLINICAL DATA:  Right-sided abdominal pain EXAM: CT ABDOMEN AND PELVIS WITH CONTRAST TECHNIQUE: Multidetector CT imaging of the abdomen and pelvis was performed using the  standard protocol following bolus administration of intravenous contrast. CONTRAST:  167mL OMNIPAQUE IOHEXOL 300 MG/ML SOLN, 74mL OMNIPAQUE IOHEXOL 300 MG/ML SOLN COMPARISON:  CT abdomen pelvis 06/28/2018 FINDINGS: LOWER CHEST: No basilar pleural or apical pericardial effusion. HEPATOBILIARY: Diffuse hypoattenuation of the liver relative to the spleen suggests hepatic steatosis. No focal liver lesion or biliary dilatation. The gallbladder is normal. PANCREAS: Normal pancreatic contours without pancreatic ductal dilatation or peripancreatic fluid collection. SPLEEN: Normal. ADRENALS/URINARY TRACT: --Adrenal glands: Normal. --Right kidney/ureter: No hydronephrosis, nephroureterolithiasis or solid renal mass. --Left kidney/ureter: No hydronephrosis, nephroureterolithiasis or solid renal mass. --Urinary bladder: Normal for degree of distention STOMACH/BOWEL: --Stomach/Duodenum: There is no hiatal hernia. The duodenal course and caliber are normal. --Small bowel: No dilatation or inflammation. --Colon: No focal abnormality. --Appendix: Normal. VASCULAR/LYMPHATIC: There is calcific atherosclerosis of the abdominal aorta. No abdominal or pelvic lymphadenopathy. REPRODUCTIVE: Normal prostate size with symmetric seminal vesicles. MUSCULOSKELETAL. No bony spinal canal stenosis or focal osseous abnormality. OTHER: None. IMPRESSION: 1. No acute abdominal or pelvic abnormality. 2. Hepatic steatosis. 3. Aortic Atherosclerosis (ICD10-I70.0). Electronically Signed   By: Ulyses Jarred M.D.   On: 01/06/2019 23:54     MDM   Plan is to follow-up on CT scan abdomen pelvis, all outpatient to become clinically sober p.o. challenge and discharge.  Patient was reevaluated, he is clinically sober and reports that he feels his normal baseline.  RN notes he has walked to the bathroom with out difficulty.  CT scan without evidence of acute cause for his pain. EKG obtained without abnormalities consistent with ischemia.  Patient is  given prescription for Prilosec and Carafate for suspected alcohol gastritis.  Return precautions were discussed with patient who states their understanding.  At the time of discharge patient denied any unaddressed complaints or concerns.  Patient is agreeable for discharge home.       Lorin Glass, PA-C 01/07/19 0112    Drenda Freeze, MD 01/09/19 [phone]

## 2019-01-06 NOTE — ED Notes (Signed)
Pt. Documented in error see above note in chart. 

## 2019-01-06 NOTE — ED Triage Notes (Signed)
Pt BIB EMS from home. Pt reports right side abdominal pain, constipation, and N/V x2 weeks. Pt reports an intermittent protruding knot on right lower abdomen. Pt reports last bowel movement today.

## 2019-01-06 NOTE — ED Provider Notes (Signed)
[facility] DEPT Provider Note   CSN: [id number] Arrival date & time: 01/06/19  1710     History   Chief Complaint Chief Complaint  Patient presents with  . Nausea  . Abdominal Pain    HPI Joseph Underwood is a 53 y.o. male possible history of alcohol abuse, hiccups, hypertension, hyperlipidemia, seizures who presents for evaluation of right-sided abdominal pain and nausea/vomiting.  He states that this is been ongoing for the last couple weeks.  He states he has not sought evaluation for this pain.  He states he came today because his pain was worsening.  He states most the time his pain is in the right side that he occasionally has some periumbilical pain.  Right now, he feels like it is more periumbilical, right lower quadrant, right upper quadrant.  Reports vomiting today but no gross hematemesis.  His last bowel movement was earlier today.  Denies any blood in stool or melena.  He states that the pain is not associated with anything in particular and is not made worse after eating.  He has not noted any fevers.  He states that occasionally when he throws up, he noticed a knot on his stomach.  He states that it does not stay there all the time.  He states that he does not have any chest pain, difficulty breathing, urinary complaints.  He does report that he drinks alcohol every day.  He states that he drinks about three 16 ounce beers a day.  His last drink was earlier today.     The history is provided by the patient.    Past Medical History:  Diagnosis Date  . Alcohol abuse   . 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 Allergy   . Hiccups   . HTN (hypertension)   . Hyperlipidemia   . Seizures ([place]) 03/10/2014    Patient Active Problem List   Diagnosis Date Noted  . Perirectal abscess 07/26/2018  . Alcohol withdrawal ([place]) 01/10/2018  . Left shoulder pain 05/23/2017  . Erectile dysfunction 04/13/2016  . Preventative health care 04/12/2016  .  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 Tachycardia 03/28/2014  . Liver cirrhosis ([place]) 03/22/2014  . Hypertriglyceridemia 04/24/2013  . Tobacco use 04/24/2013  . Essential hypertension, benign 01/01/2013  . Alcohol use disorder, moderate, dependence ([place]) 04/13/2006  . Alcohol withdrawal seizure ([place]) 04/13/2006    Past Surgical History:  Procedure Laterality Date  . NO PAST SURGERIES          Home Medications    Prior to Admission medications   Medication Sig Start Date End Date Taking? Authorizing Provider  amLODipine (NORVASC) 10 MG tablet Take 1 tablet (10 mg total) by mouth daily. 01/22/18   Kalman Shan Ratliff, DO  atorvastatin (LIPITOR) 20 MG tablet Take 1 tablet (20 mg total) by mouth daily. IM program. Patient not taking: Reported on 01/10/2018 04/25/17   Shela Leff, MD  fenofibrate (TRICOR) 145 MG tablet Take 1 tablet (145 mg total) by mouth daily. IM program Patient not taking: Reported on 10/30/2017 04/25/17   Shela Leff, MD  folic acid (FOLVITE) 1 MG tablet Take 1 tablet (1 mg total) by mouth daily. 03/08/18   Dorrell, Andree Elk, MD  magnesium chloride (SLOW-MAG) 64 MG TBEC SR tablet Take 2 tablets (128 mg total) by mouth daily. [facility] Patient not taking: Reported on 01/10/2018 10/19/16   Burgess Estelle, MD  naltrexone (DEPADE) 50 MG tablet Take 1 tablet (50 mg  total) by mouth daily. IM Program. Patient not taking: Reported on 10/30/2017 04/12/16   Shela Leff, MD  pantoprazole (PROTONIX) 40 MG tablet Take 1 tablet (40 mg total) by mouth daily. Patient not taking: Reported on 01/10/2018 10/31/17   Ward, Delice Bison, DO  phenytoin (DILANTIN) 300 MG ER capsule Take 1 capsule (300 mg total) by mouth daily. 07/25/18   Neva Seat, MD  sildenafil (REVATIO) 20 MG tablet Take 2 tablets (40 mg total) by mouth daily as needed. IM program. Patient not taking: Reported on 01/10/2018 07/05/16   Shela Leff, MD    Family  History Family History  Problem Relation Age of Onset  . Hypertension Mother   . Colon cancer Neg Hx   . Esophageal cancer Neg Hx   . Rectal cancer Neg Hx   . Stomach cancer Neg Hx     Social History Social History   Tobacco Use  . Smoking status: Current Every Day Smoker    Packs/day: 0.01    Years: 37.00    Pack years: 0.37    Types: Cigarettes  . Smokeless tobacco: Never Used  . Tobacco comment: 1 cigs per day  Substance Use Topics  . Alcohol use: Yes    Alcohol/week: 88.0 standard drinks    Types: 14 Cans of beer, 74 Shots of liquor per week    Comment: Beer 40oz 4 days a week  . Drug use: No     Allergies   Patient has no known allergies.   Review of Systems Review of Systems  Constitutional: Negative for fever.  Respiratory: Negative for cough and shortness of breath.   Cardiovascular: Negative for chest pain.  Gastrointestinal: Positive for abdominal pain, nausea and vomiting.  Genitourinary: Negative for dysuria and hematuria.  Neurological: Negative for headaches.  All other systems reviewed and are negative.    Physical Exam Updated Vital Signs BP (!) 165/118   Pulse (!) 111   Temp 98.7 F (37.1 C) (Oral)   Resp 16   SpO2 97%   Physical Exam Vitals signs and nursing note reviewed.  Constitutional:      Appearance: Normal appearance. He is well-developed.  HENT:     Head: Normocephalic and atraumatic.  Eyes:     General: Lids are normal.     Conjunctiva/sclera: Conjunctivae normal.     Pupils: Pupils are equal, round, and reactive to light.  Neck:     Musculoskeletal: Full passive range of motion without pain.  Cardiovascular:     Rate and Rhythm: Regular rhythm. Tachycardia present.     Pulses: Normal pulses.     Heart sounds: Normal heart sounds. No murmur. No friction rub. No gallop.   Pulmonary:     Effort: Pulmonary effort is normal.     Breath sounds: Normal breath sounds.     Comments: Lungs clear to auscultation bilaterally.   Symmetric chest rise.  No wheezing, rales, rhonchi. Abdominal:     Palpations: Abdomen is soft. Abdomen is not rigid.     Tenderness: There is abdominal tenderness in the right upper quadrant, right lower quadrant and periumbilical area. There is no right CVA tenderness, left CVA tenderness or guarding.     Comments: Abdomen soft, nondistended.  Tenderness palpation noted to right lower quadrant, periumbilical, right upper quadrant.  No rigidity.  He does have some guarding noted.  No CVA tenderness bilaterally.  No palpable hernia noted.    Musculoskeletal: Normal range of motion.  Skin:    General: Skin  is warm and dry.     Capillary Refill: Capillary refill takes less than 2 seconds.  Neurological:     Mental Status: He is alert and oriented to person, place, and time.  Psychiatric:        Speech: Speech normal.      ED Treatments / Results  Labs (all labs ordered are listed, but only abnormal results are displayed) Labs Reviewed  COMPREHENSIVE METABOLIC PANEL - Abnormal; Notable for the following components:      Result Value   Calcium 8.3 (*)    Albumin 3.4 (*)    AST 190 (*)    ALT 47 (*)    All other components within normal limits  ETHANOL - Abnormal; Notable for the following components:   Alcohol, Ethyl (B) 357 (*)    All other components within normal limits  CBC WITH DIFFERENTIAL/PLATELET - Abnormal; Notable for the following components:   RBC 3.53 (*)    Hemoglobin 12.2 (*)    HCT 35.4 (*)    MCV 100.3 (*)    MCH 34.6 (*)    All other components within normal limits  URINALYSIS, ROUTINE W REFLEX MICROSCOPIC - Abnormal; Notable for the following components:   Hgb urine dipstick SMALL (*)    Protein, ur 100 (*)    All other components within normal limits  LIPASE, BLOOD    EKG None  Radiology Ct Head Wo Contrast  Result Date: 01/06/2019 CLINICAL DATA:  Pt BIB EMS from home. Pt reports right side abdominal pain, constipation, and N/V x2 weeks. Pt reports  an intermittent protruding knot on right lower abdomen. Pt reports last bowel movement today. EXAM: CT HEAD WITHOUT CONTRAST TECHNIQUE: Contiguous axial images were obtained from the base of the skull through the vertex without intravenous contrast. COMPARISON:  03/20/2018 FINDINGS: Brain: No evidence of acute infarction, hemorrhage, hydrocephalus, extra-axial collection or mass lesion/mass effect. There is ventricular sulcal enlargement reflecting atrophy advanced for age. Mild white matter hypoattenuation is also present consistent with chronic microvascular ischemic change. Vascular: No hyperdense vessel or unexpected calcification. Skull: Normal. Negative for fracture or focal lesion. Sinuses/Orbits: No acute finding. Other: None. IMPRESSION: 1. No acute intracranial abnormalities. 2. Atrophy advanced for age. Mild chronic microvascular ischemic change. Electronically Signed   By: Lajean Manes M.D.   On: 01/06/2019 20:26    Procedures Procedures (including critical care time)  Medications Ordered in ED Medications  fentaNYL (SUBLIMAZE) injection 50 mcg (50 mcg Intravenous Given 01/06/19 1904)  ondansetron (ZOFRAN) injection 4 mg (4 mg Intravenous Given 01/06/19 1904)  sodium chloride 0.9 % bolus 1,000 mL (1,000 mLs Intravenous New Bag/Given (Non-Interop) 01/06/19 1917)  thiamine (B-1) injection 100 mg (100 mg Intravenous Given 01/06/19 1949)     Initial Impression / Assessment and Plan / ED Course  I have reviewed the triage vital signs and the nursing notes.  Pertinent labs & imaging results that were available during my care of the patient were reviewed by me and considered in my medical decision making (see chart for details).        53 year old male who presents for evaluation of abdominal pain.  He states is been ongoing issue for several weeks.  Associated with nausea/vomiting.  Reports his last bowel movement was earlier today.  No blood in stool.  No fevers.  Initially arrival,  he is afebrile, slightly tachycardic, slightly hypertensive.  Vitals otherwise stable.  Concern for hepatobiliary etiology versus infectious etiology.  He has some tenderness noted to the  right upper, right lower and periumbilical area.  I do not feel any palpable mass would be concerning for incarcerated or strangulated hernia.  Patient agrees that the bump is not present at this time.  Also question if he is intoxicated.  He does report that he drank beer earlier this evening.  At this time, he is not tremulous.  He does have some mild tachycardia but I suspect is more likely due to pain.  He does not appear to be in acute withdrawals at this time.  Plan check labs.  Ethanol level is 357.  Lipase is unremarkable.  LFTs are slightly elevated.  Total bili is within normal limits.  CBC is without any significant leukocytosis.  Hemoglobin is 12.2.  We will give patient fluids, thiamine and Zofran.  CT head negative for any acute abnormalities.  Patient signed out to Joseph Quaker, PA-C with reevaluation and CT abd/pelvis pending.   Portions of this note were generated with [employer]. Dictation errors may occur despite best attempts at proofreading.   Final Clinical Impressions(s) / ED Diagnoses   Final diagnoses:  Alcoholic intoxication without complication [facility])    ED Discharge Orders    None       Desma Mcgregor 01/06/19 2055    Sherwood Gambler, MD 01/07/19 1021

## 2019-01-07 MED ORDER — SUCRALFATE 1 G PO TABS: 1.0000 g | ORAL_TABLET | Freq: Three times a day (TID) | ORAL | 0 refills | Status: DC

## 2019-01-07 MED ORDER — OMEPRAZOLE 20 MG PO CPDR: 20.0000 mg | DELAYED_RELEASE_CAPSULE | Freq: Every day | ORAL | 0 refills | Status: DC

## 2019-01-07 NOTE — Discharge Instructions (Signed)
Please make sure you are drinking plenty of water.  I have given you a prescription today for Carafate which is a medicine designed to help coat your stomach to reduce irritation.  I have also given you a prescription for Prilosec.  This is available over-the-counter and will help reduce stomach acid.

## 2019-01-07 NOTE — ED Notes (Signed)
Patient tolerating fluids and sandwich with no difficulty.

## 2019-01-07 NOTE — ED Notes (Signed)
Family called for transportation home.

## 2019-01-07 NOTE — ED Notes (Signed)
Patient ambulated to the restroom with no difficulty   ° °

## 2019-01-09 MED FILL — SUCRALFATE 1 GM TABLET: 1 | 14 days supply | Qty: 56 | Fill #0

## 2019-01-09 MED FILL — AMLODIPINE BESYLATE 10 MG T: 10 | 30 days supply | Qty: 30 | Fill #1

## 2019-01-09 MED FILL — OMEPRAZOLE 20 MG CAPSULE DR: 20 | 14 days supply | Qty: 14 | Fill #0

## 2019-01-09 MED FILL — PHENYTOIN SOD EXT 300 MG CA: 300 | 30 days supply | Qty: 30 | Fill #1

## 2019-01-14 ENCOUNTER — Emergency Department (HOSPITAL_COMMUNITY)
Admission: EM | Admit: 2019-01-14 | Discharge: 2019-01-14 | Disposition: A | Discharge status: Home / Self Care | Payer: Self-pay | Attending: Emergency Medicine | Admitting: Emergency Medicine

## 2019-01-14 ENCOUNTER — Other Ambulatory Visit: Payer: Self-pay

## 2019-01-14 ENCOUNTER — Encounter (HOSPITAL_COMMUNITY): Payer: Self-pay | Admitting: Emergency Medicine

## 2019-01-14 DIAGNOSIS — F17210 Nicotine dependence, cigarettes, uncomplicated: Secondary | ICD-10-CM | POA: Insufficient documentation

## 2019-01-14 DIAGNOSIS — R569 Unspecified convulsions: Secondary | ICD-10-CM | POA: Insufficient documentation

## 2019-01-14 DIAGNOSIS — Z79899 Other long term (current) drug therapy: Secondary | ICD-10-CM | POA: Insufficient documentation

## 2019-01-14 DIAGNOSIS — I10 Essential (primary) hypertension: Secondary | ICD-10-CM | POA: Insufficient documentation

## 2019-01-14 DIAGNOSIS — G40909 Epilepsy, unspecified, not intractable, without status epilepticus: Secondary | ICD-10-CM

## 2019-01-14 LAB — I-STAT CHEM 8, ED
BUN: 9 mg/dL (ref 6–20)
Calcium, Ion: 1.02 mmol/L — ABNORMAL LOW (ref 1.15–1.40)
Chloride: 92 mmol/L — ABNORMAL LOW (ref 98–111)
Creatinine, Ser: 0.9 mg/dL (ref 0.61–1.24)
Glucose, Bld: 186 mg/dL — ABNORMAL HIGH (ref 70–99)
HCT: 46 % (ref 39.0–52.0)
Hemoglobin: 15.6 g/dL (ref 13.0–17.0)
Potassium: 3.3 mmol/L — ABNORMAL LOW (ref 3.5–5.1)
Sodium: 135 mmol/L (ref 135–145)
TCO2: 27 mmol/L (ref 22–32)

## 2019-01-14 LAB — RAPID URINE DRUG SCREEN, HOSP PERFORMED
Amphetamines: NOT DETECTED
Barbiturates: NOT DETECTED
Benzodiazepines: NOT DETECTED
Cocaine: NOT DETECTED
Opiates: NOT DETECTED
Tetrahydrocannabinol: NOT DETECTED

## 2019-01-14 LAB — CBC WITH DIFFERENTIAL/PLATELET
Abs Immature Granulocytes: 0.05 10*3/uL (ref 0.00–0.07)
Basophils Absolute: 0 10*3/uL (ref 0.0–0.1)
Basophils Relative: 1 %
Eosinophils Absolute: 0 10*3/uL (ref 0.0–0.5)
Eosinophils Relative: 0 %
HCT: 41.4 % (ref 39.0–52.0)
Hemoglobin: 14.3 g/dL (ref 13.0–17.0)
Immature Granulocytes: 1 %
Lymphocytes Relative: 10 %
Lymphs Abs: 0.5 10*3/uL — ABNORMAL LOW (ref 0.7–4.0)
MCH: 34.5 pg — ABNORMAL HIGH (ref 26.0–34.0)
MCHC: 34.5 g/dL (ref 30.0–36.0)
MCV: 99.8 fL (ref 80.0–100.0)
Monocytes Absolute: 0.4 10*3/uL (ref 0.1–1.0)
Monocytes Relative: 9 %
Neutro Abs: 3.8 10*3/uL (ref 1.7–7.7)
Neutrophils Relative %: 79 %
Platelets: 185 10*3/uL (ref 150–400)
RBC: 4.15 MIL/uL — ABNORMAL LOW (ref 4.22–5.81)
RDW: 13.9 % (ref 11.5–15.5)
WBC: 4.8 10*3/uL (ref 4.0–10.5)
nRBC: 0.6 % — ABNORMAL HIGH (ref 0.0–0.2)

## 2019-01-14 LAB — PHENYTOIN LEVEL, TOTAL: Phenytoin Lvl: 18.9 ug/mL (ref 10.0–20.0)

## 2019-01-14 MED ORDER — LEVETIRACETAM IN NACL 1000 MG/100ML IV SOLN
1000.0000 mg | Freq: Once | INTRAVENOUS | Status: AC
  Administered 2019-01-14: 1000 mg via INTRAVENOUS
  Filled 2019-01-14: qty 100

## 2019-01-14 MED ORDER — LEVETIRACETAM 500 MG PO TABS: 500.0000 mg | ORAL_TABLET | Freq: Two times a day (BID) | ORAL | 0 refills | Status: DC

## 2019-01-14 MED ORDER — LEVETIRACETAM 500 MG PO TABS: 500.0000 mg | ORAL_TABLET | ORAL | Status: DC

## 2019-01-14 MED ORDER — SODIUM CHLORIDE 0.9 % IV SOLN
INTRAVENOUS | Status: DC
  Administered 2019-01-14: 05:00:00 via INTRAVENOUS

## 2019-01-14 MED FILL — levETIRAcetam 500 MG TABS: 500 | 30 days supply | Qty: 60 | Fill #0

## 2019-01-14 NOTE — ED Notes (Signed)
Pt voided upon arrival to ED, no urine sample obtained at that time. Will re-attempt to collect urine specimen later.

## 2019-01-14 NOTE — ED Notes (Signed)
Pt was verbalized discharge instructions. Pt had no further questions at this time. NAD. Pt ambulatory upon discharge.

## 2019-01-14 NOTE — Discharge Instructions (Signed)
No driving until cleared by neurology

## 2019-01-14 NOTE — ED Triage Notes (Signed)
Patient here from home with complaints of seizure lasting 2 min. Hx of same. Reports that last drink was 2 days ago.

## 2019-01-14 NOTE — ED Provider Notes (Signed)
[facility] DEPT Provider Note   CSN: [id number] Arrival date & time: 01/14/19  0005     History   Chief Complaint Chief Complaint  Patient presents with   Seizures    HPI Joseph Underwood is a 53 y.o. male.     The history is provided by the patient.  Seizures Seizure activity on arrival: no   Seizure type:  Grand mal Preceding symptoms: no sensation of an aura present   Initial focality:  None Episode characteristics: generalized shaking   Postictal symptoms: no confusion   Return to baseline: yes   Severity:  Mild Timing:  Once Number of seizures this episode:  1 Progression:  Resolved Context: not hydrocephalus, not intracranial lesion and not intracranial shunt   Recent head injury:  No recent head injuries PTA treatment:  None History of seizures: yes     Past Medical History:  Diagnosis Date   Alcohol abuse    Alcohol related seizure ([place])    Allergy    Hiccups    HTN (hypertension)    Hyperlipidemia    Seizures ([place]) 03/10/2014    Patient Active Problem List   Diagnosis Date Noted   Perirectal abscess 07/26/2018   Alcohol withdrawal ([place]) 01/10/2018   Left shoulder pain 05/23/2017   Erectile dysfunction 04/13/2016   Preventative health care 04/12/2016   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 Authorizing Provider  amLODipine (NORVASC) 10 MG tablet Take 1 tablet (10 mg total) by mouth daily. 01/22/18  Yes Hoffman, Jessica Ratliff, DO    phenytoin (DILANTIN) 300 MG ER capsule Take 1 capsule (300 mg total) by mouth daily. 07/25/18  Yes Neva Seat, MD  sucralfate (CARAFATE) 1 g tablet Take 1 tablet (1 g total) by mouth 4 (four) times daily -  with meals and at bedtime for 14 days. 01/07/19 01/21/19 Yes Lorin Glass, PA-C  atorvastatin (LIPITOR) 20 MG tablet Take 1 tablet (20 mg total) by mouth daily. IM program. Patient not taking: Reported on 01/10/2018 04/25/17   Shela Leff, MD  fenofibrate (TRICOR) 145 MG tablet Take 1 tablet (145 mg total) by mouth daily. IM program Patient not taking: Reported on 10/30/2017 04/25/17   Shela Leff, MD  folic acid (FOLVITE) 1 MG tablet Take 1 tablet (1 mg total) by mouth daily. Patient not taking: Reported on 01/14/2019 03/08/18   Dorrell, Andree Elk, MD  levETIRAcetam (KEPPRA) 500 MG tablet Take 1 tablet (500 mg total) by mouth 2 (two) times daily. 01/14/19   Ertha Nabor, MD  magnesium chloride (SLOW-MAG) 64 MG TBEC SR tablet Take 2 tablets (128 mg total) by mouth daily. [facility] Patient not taking: Reported on 01/10/2018 10/19/16   Burgess Estelle, MD  naltrexone (DEPADE) 50 MG tablet Take 1 tablet (50 mg total) by mouth daily. IM Program. Patient not taking: Reported on 10/30/2017 04/12/16   Shela Leff, MD  omeprazole (PRILOSEC) 20 MG capsule Take 1 capsule (20 mg total) by mouth daily for 14 days. Patient not taking:  Reported on 01/14/2019 01/07/19 01/21/19  Lorin Glass, PA-C  pantoprazole (PROTONIX) 40 MG tablet Take 1 tablet (40 mg total) by mouth daily. Patient not taking: Reported on 01/10/2018 10/31/17   Ward, Delice Bison, DO  sildenafil (REVATIO) 20 MG tablet Take 2 tablets (40 mg total) by mouth daily as needed. IM program. Patient not taking: Reported on 01/10/2018 07/05/16   Shela Leff, MD    Family History Family History  Problem Relation Age of Onset   Hypertension Mother    Colon cancer Neg Hx    Esophageal cancer  Neg Hx    Rectal cancer Neg Hx    Stomach cancer Neg Hx     Social History Social History   Tobacco Use   Smoking status: Current Every Day Smoker    Packs/day: 0.01    Years: 37.00    Pack years: 0.37    Types: Cigarettes   Smokeless tobacco: Never Used   Tobacco comment: 1 cigs per day  Substance Use Topics   Alcohol use: Yes    Alcohol/week: 88.0 standard drinks    Types: 14 Cans of beer, 74 Shots of liquor per week    Comment: Beer 40oz 4 days a week   Drug use: No     Allergies   Patient has no known allergies.   Review of Systems Review of Systems  Constitutional: Negative for fever.  HENT: Negative for congestion.   Eyes: Negative for visual disturbance.  Respiratory: Negative for cough.   Cardiovascular: Negative for chest pain, palpitations and leg swelling.  Gastrointestinal: Negative for abdominal pain.  Genitourinary: Negative for difficulty urinating.  Musculoskeletal: Negative for arthralgias.  Neurological: Positive for seizures. Negative for tremors.  Psychiatric/Behavioral: Negative for agitation and confusion.  All other systems reviewed and are negative.    Physical Exam Updated Vital Signs BP (!) 142/105    Pulse (!) 110    Temp 98 F (36.7 C) (Oral)    Resp 14    Ht 5\' 5"  (1.651 m)    Wt 49.9 kg    SpO2 99%    BMI 18.30 kg/m   Physical Exam Vitals signs and nursing note reviewed.  Constitutional:      General: He is not in acute distress.    Appearance: Normal appearance.  HENT:     Head: Normocephalic and atraumatic.     Nose: Nose normal.  Eyes:     Conjunctiva/sclera: Conjunctivae normal.     Pupils: Pupils are equal, round, and reactive to light.  Neck:     Musculoskeletal: Normal range of motion and neck supple.  Cardiovascular:     Rate and Rhythm: Normal rate and regular rhythm.     Pulses: Normal pulses.     Heart sounds: Normal heart sounds.  Pulmonary:     Effort: Pulmonary effort is normal.     Breath sounds:  Normal breath sounds.  Abdominal:     General: Abdomen is flat. Bowel sounds are normal.     Tenderness: There is no abdominal tenderness. There is no guarding.  Musculoskeletal: Normal range of motion.  Skin:    General: Skin is warm and dry.     Capillary Refill: Capillary refill takes less than 2 seconds.  Neurological:     General: No focal deficit present.     Mental Status: He is alert and oriented to person, place, and time.  Psychiatric:        Mood and Affect: Mood normal.  Behavior: Behavior normal.      ED Treatments / Results  Labs (all labs ordered are listed, but only abnormal results are displayed) Results for orders placed or performed during the hospital encounter of 01/14/19  CBC with Differential  Result Value Ref Range   WBC 4.8 4.0 - 10.5 K/uL   RBC 4.15 (L) 4.22 - 5.81 MIL/uL   Hemoglobin 14.3 13.0 - 17.0 g/dL   HCT 41.4 39.0 - 52.0 %   MCV 99.8 80.0 - 100.0 fL   MCH 34.5 (H) 26.0 - 34.0 pg   MCHC 34.5 30.0 - 36.0 g/dL   RDW 13.9 11.5 - 15.5 %   Platelets 185 150 - 400 K/uL   nRBC 0.6 (H) 0.0 - 0.2 %   Neutrophils Relative % 79 %   Neutro Abs 3.8 1.7 - 7.7 K/uL   Lymphocytes Relative 10 %   Lymphs Abs 0.5 (L) 0.7 - 4.0 K/uL   Monocytes Relative 9 %   Monocytes Absolute 0.4 0.1 - 1.0 K/uL   Eosinophils Relative 0 %   Eosinophils Absolute 0.0 0.0 - 0.5 K/uL   Basophils Relative 1 %   Basophils Absolute 0.0 0.0 - 0.1 K/uL   Immature Granulocytes 1 %   Abs Immature Granulocytes 0.05 0.00 - 0.07 K/uL  Rapid urine drug screen (hospital performed)  Result Value Ref Range   Opiates NONE DETECTED NONE DETECTED   Cocaine NONE DETECTED NONE DETECTED   Benzodiazepines NONE DETECTED NONE DETECTED   Amphetamines NONE DETECTED NONE DETECTED   Tetrahydrocannabinol NONE DETECTED NONE DETECTED   Barbiturates NONE DETECTED NONE DETECTED  Phenytoin level, total  Result Value Ref Range   Phenytoin Lvl 18.9 10.0 - 20.0 ug/mL  I-stat chem 8, ED (not at  [facility] or [facility])  Result Value Ref Range   Sodium 135 135 - 145 mmol/L   Potassium 3.3 (L) 3.5 - 5.1 mmol/L   Chloride 92 (L) 98 - 111 mmol/L   BUN 9 6 - 20 mg/dL   Creatinine, Ser 0.90 0.61 - 1.24 mg/dL   Glucose, Bld 186 (H) 70 - 99 mg/dL   Calcium, Ion 1.02 (L) 1.15 - 1.40 mmol/L   TCO2 27 22 - 32 mmol/L   Hemoglobin 15.6 13.0 - 17.0 g/dL   HCT 46.0 39.0 - 52.0 %   Ct Head Wo Contrast  Result Date: 01/06/2019 CLINICAL DATA:  Pt BIB EMS from home. Pt reports right side abdominal pain, constipation, and N/V x2 weeks. Pt reports an intermittent protruding knot on right lower abdomen. Pt reports last bowel movement today. EXAM: CT HEAD WITHOUT CONTRAST TECHNIQUE: Contiguous axial images were obtained from the base of the skull through the vertex without intravenous contrast. COMPARISON:  03/20/2018 FINDINGS: Brain: No evidence of acute infarction, hemorrhage, hydrocephalus, extra-axial collection or mass lesion/mass effect. There is ventricular sulcal enlargement reflecting atrophy advanced for age. Mild white matter hypoattenuation is also present consistent with chronic microvascular ischemic change. Vascular: No hyperdense vessel or unexpected calcification. Skull: Normal. Negative for fracture or focal lesion. Sinuses/Orbits: No acute finding. Other: None. IMPRESSION: 1. No acute intracranial abnormalities. 2. Atrophy advanced for age. Mild chronic microvascular ischemic change. Electronically Signed   By: Lajean Manes M.D.   On: 01/06/2019 20:26   Ct Abdomen Pelvis W Contrast  Result Date: 01/06/2019 CLINICAL DATA:  Right-sided abdominal pain EXAM: CT ABDOMEN AND PELVIS WITH CONTRAST TECHNIQUE: Multidetector CT imaging of the abdomen and pelvis was performed using the standard protocol following bolus administration of  intravenous contrast. CONTRAST:  156mL OMNIPAQUE IOHEXOL 300 MG/ML SOLN, 94mL OMNIPAQUE IOHEXOL 300 MG/ML SOLN COMPARISON:  CT abdomen pelvis 06/28/2018 FINDINGS: LOWER CHEST: No  basilar pleural or apical pericardial effusion. HEPATOBILIARY: Diffuse hypoattenuation of the liver relative to the spleen suggests hepatic steatosis. No focal liver lesion or biliary dilatation. The gallbladder is normal. PANCREAS: Normal pancreatic contours without pancreatic ductal dilatation or peripancreatic fluid collection. SPLEEN: Normal. ADRENALS/URINARY TRACT: --Adrenal glands: Normal. --Right kidney/ureter: No hydronephrosis, nephroureterolithiasis or solid renal mass. --Left kidney/ureter: No hydronephrosis, nephroureterolithiasis or solid renal mass. --Urinary bladder: Normal for degree of distention STOMACH/BOWEL: --Stomach/Duodenum: There is no hiatal hernia. The duodenal course and caliber are normal. --Small bowel: No dilatation or inflammation. --Colon: No focal abnormality. --Appendix: Normal. VASCULAR/LYMPHATIC: There is calcific atherosclerosis of the abdominal aorta. No abdominal or pelvic lymphadenopathy. REPRODUCTIVE: Normal prostate size with symmetric seminal vesicles. MUSCULOSKELETAL. No bony spinal canal stenosis or focal osseous abnormality. OTHER: None. IMPRESSION: 1. No acute abdominal or pelvic abnormality. 2. Hepatic steatosis. 3. Aortic Atherosclerosis (ICD10-I70.0). Electronically Signed   By: Ulyses Jarred M.D.   On: 01/06/2019 23:54     Radiology No results found.  Procedures Procedures (including critical care time)  Medications Ordered in ED Medications  0.9 %  sodium chloride infusion ( Intravenous New Bag/Given 01/14/19 0503)  levETIRAcetam (KEPPRA) IVPB 1000 mg/100 mL premix (0 mg Intravenous Stopped 01/14/19 0527)     Initial Impression / Assessment and Plan / ED Course   Loaded with keppra in the ED.  Phenytoin level is therapeutic. No signs of DTs.  Resting comfortably in the ED.  Stable for discharge with close follow up.  No driving until cleared by neurology. As the patient seized and was therapeutic on dilantin I will start keppra for home.     Lavell E Underwood was evaluated in Emergency Department on 01/14/2019 for the symptoms described in the history of present illness. He was evaluated in the context of the global COVID-19 pandemic, which necessitated consideration that the patient might be at risk for infection with the SARS-CoV-2 virus that causes COVID-19. Institutional protocols and algorithms that pertain to the evaluation of patients at risk for COVID-19 are in a state of rapid change based on information released by regulatory bodies including the CDC and federal and state organizations. These policies and algorithms were followed during the patient's care in the ED.   Final Clinical Impressions(s) / ED Diagnoses   Final diagnoses:  Seizure disorder ([place])   Return for intractable cough, coughing up blood,fevers >100.4 unrelieved by medication, shortness of breath, intractable vomiting, chest pain, shortness of breath, weakness,numbness, changes in speech, facial asymmetry,abdominal pain, passing out,Inability to tolerate liquids or food, cough, altered mental status or any concerns. No signs of systemic illness or infection. The patient is nontoxic-appearing on exam and vital signs are within normal limits.   I have reviewed the triage vital signs and the nursing notes. Pertinent labs &imaging results that were available during my care of the patient were reviewed by me and considered in my medical decision making (see chart for details).  After history, exam, and medical workup I feel the patient has been appropriately medically screened and is safe for discharge home. Pertinent diagnoses were discussed with the patient. Patient was given return precautions  ED Discharge Orders         Ordered    levETIRAcetam (KEPPRA) 500 MG tablet  2 times daily     01/14/19 [phone]  Margery Szostak, MD 01/14/19 [id number]

## 2019-01-17 ENCOUNTER — Other Ambulatory Visit: Payer: Self-pay | Admitting: *Deleted

## 2019-01-17 DIAGNOSIS — E781 Pure hyperglyceridemia: Secondary | ICD-10-CM

## 2019-01-17 MED ORDER — FENOFIBRATE 145 MG PO TABS: 145.0000 mg | ORAL_TABLET | Freq: Every day | ORAL | 1 refills | Status: DC

## 2019-01-17 MED ORDER — ATORVASTATIN CALCIUM 20 MG PO TABS: 20.0000 mg | ORAL_TABLET | Freq: Every day | ORAL | 1 refills | Status: DC

## 2019-01-17 MED FILL — ATORVASTATIN 20 MG TABLET: 20 | 30 days supply | Qty: 30 | Fill #0

## 2019-01-17 MED FILL — FENOFIBRATE 145 MG TAB: 145 | 30 days supply | Qty: 30 | Fill #0

## 2019-01-21 NOTE — Telephone Encounter (Signed)
Spoke with the pt.  He has sch an appt  to come in to the  [facility] for 01/23/2019.

## 2019-01-23 ENCOUNTER — Other Ambulatory Visit: Payer: Self-pay

## 2019-01-23 ENCOUNTER — Ambulatory Visit (INDEPENDENT_AMBULATORY_CARE_PROVIDER_SITE_OTHER): Payer: Self-pay | Admitting: Internal Medicine

## 2019-01-23 ENCOUNTER — Encounter: Payer: Self-pay | Admitting: Internal Medicine

## 2019-01-23 VITALS — BP 153/95 | HR 120 | Temp 98.7°F | Wt 105.3 lb

## 2019-01-23 DIAGNOSIS — F1020 Alcohol dependence, uncomplicated: Secondary | ICD-10-CM

## 2019-01-23 DIAGNOSIS — G40909 Epilepsy, unspecified, not intractable, without status epilepticus: Secondary | ICD-10-CM

## 2019-01-23 DIAGNOSIS — I10 Essential (primary) hypertension: Secondary | ICD-10-CM

## 2019-01-23 DIAGNOSIS — Z79899 Other long term (current) drug therapy: Secondary | ICD-10-CM

## 2019-01-23 DIAGNOSIS — E781 Pure hyperglyceridemia: Secondary | ICD-10-CM

## 2019-01-23 DIAGNOSIS — R569 Unspecified convulsions: Secondary | ICD-10-CM

## 2019-01-23 DIAGNOSIS — Z23 Encounter for immunization: Secondary | ICD-10-CM

## 2019-01-23 DIAGNOSIS — K219 Gastro-esophageal reflux disease without esophagitis: Secondary | ICD-10-CM

## 2019-01-23 DIAGNOSIS — R000 Tachycardia, unspecified: Secondary | ICD-10-CM

## 2019-01-23 MED ORDER — ATORVASTATIN CALCIUM 20 MG PO TABS: 20.0000 mg | ORAL_TABLET | Freq: Every day | ORAL | 1 refills | Status: DC

## 2019-01-23 MED ORDER — DILTIAZEM HCL ER COATED BEADS 120 MG PO CP24: 120.0000 mg | ORAL_CAPSULE | Freq: Every day | ORAL | 3 refills | Status: AC

## 2019-01-23 MED ORDER — PHENYTOIN SODIUM EXTENDED 300 MG PO CAPS: 300.0000 mg | ORAL_CAPSULE | Freq: Every day | ORAL | 3 refills | Status: DC

## 2019-01-23 MED ORDER — OMEPRAZOLE 20 MG PO CPDR: 20.0000 mg | DELAYED_RELEASE_CAPSULE | Freq: Every day | ORAL | 0 refills | Status: DC

## 2019-01-23 MED ORDER — LEVETIRACETAM 500 MG PO TABS: 500.0000 mg | ORAL_TABLET | Freq: Two times a day (BID) | ORAL | 0 refills | Status: DC

## 2019-01-23 MED ORDER — AMLODIPINE BESYLATE 10 MG PO TABS: 10.0000 mg | ORAL_TABLET | Freq: Every day | ORAL | 2 refills | Status: DC

## 2019-01-23 MED ORDER — NALTREXONE HCL 50 MG PO TABS: 50.0000 mg | ORAL_TABLET | Freq: Every day | ORAL | 1 refills | Status: AC

## 2019-01-23 MED ORDER — FENOFIBRATE 145 MG PO TABS: 145.0000 mg | ORAL_TABLET | Freq: Every day | ORAL | 1 refills | Status: AC

## 2019-01-23 MED FILL — NALTREXONE 50 MG TABLET: 50 | 30 days supply | Qty: 30 | Fill #0

## 2019-01-23 MED FILL — levETIRAcetam 500 MG TABS: 500 | 30 days supply | Qty: 60 | Fill #0

## 2019-01-23 MED FILL — CARTIA XT 120 MG CP24: 120 | 30 days supply | Qty: 30 | Fill #0

## 2019-01-23 NOTE — Patient Instructions (Signed)
Thank you for allowing [place] to provide your care. I have sent out refills for all your medications.  I did send out a new medication called Keppra. You'll take one pill two times a day. I have also sent out a referral to neurology. They should be calling you within 1 to 2 weeks to schedule an appointment. Please continue all your medications as prescribed.  Under [facility] law it is illegal for you to operate a vehicle or heavy machinery until you have been without seizure for six months.  Please come back to see [place] in three months or sooner if any issues arise.

## 2019-01-23 NOTE — Progress Notes (Signed)
   CC: Epilepsy, EtOH use disorder, inappropriate sinus tachycardia, HTN, HLD  HPI:  [name] is a 53 y.o. male with PMHx listed below presenting for Epilepsy, EtOH use disorder, inappropriate sinus tachycardia, HTN, HLD. Please see the A&P for the status of the patient's chronic medical problems.  Past Medical History:  Diagnosis Date  . Alcohol abuse   . Alcohol related seizure ([place])   . Allergy   . Hiccups   . HTN (hypertension)   . Hyperlipidemia   . Seizures ([place]) 03/10/2014   Review of Systems:  Performed and all others negative.  Physical Exam: Vitals:   01/23/19 1447 01/23/19 1512  BP: (!) 161/100 (!) 153/95  Pulse: (!) 110 (!) 120  Temp: 98.7 F (37.1 C)   TempSrc: Oral   SpO2: 100%   Weight: 105 lb 4.8 oz (47.8 kg)    General: Thin male in no acute distress Pulm: Good air movement with no wheezing or crackles  CV: RRR, no murmurs, no rubs  Neuro: Alert and oriented x 3, tremulous   Assessment & Plan:   See Encounters Tab for problem based charting.  Patient discussed with Dr. Dareen Piano

## 2019-01-24 DIAGNOSIS — G40909 Epilepsy, unspecified, not intractable, without status epilepticus: Secondary | ICD-10-CM | POA: Insufficient documentation

## 2019-01-24 NOTE — Assessment & Plan Note (Signed)
Patient with known hypertriglyceridemia. He was previously on atorvastatin 20 mg daily and fenofibrate 145 mg daily. He is not been taking these medications. He states that he has been out for a while. He would like refills on them.  A/P: - Refill atorvastatin 20 mg daily and fenofibrate 145 mg daily - Repeat lipid panel in 3 to 6 months

## 2019-01-24 NOTE — Assessment & Plan Note (Signed)
Patient noted to be tachycardic. It appears that at the majority of his visits he is tachycardic. He has had multiple EKGs that illustrated sinus tachycardia. This may be multifactorial and possibly related to his alcohol use. He was previously on diltiazem 120 mg daily. He denies chest pain, shortness of breath, dizziness/lightheadedness.  A/P: - Refill diltiazem 120 mg daily

## 2019-01-24 NOTE — Progress Notes (Signed)
Internal Medicine Clinic Attending  Case discussed with Dr. Helberg at the time of the visit.  We reviewed the resident's history and exam and pertinent patient test results.  I agree with the assessment, diagnosis, and plan of care documented in the resident's note.    

## 2019-01-24 NOTE — Assessment & Plan Note (Signed)
Patient with known alcohol use disorder. Was previously on naltrexone however he has been off this medication for a long time. He is unsure if it previously helped him. He is currently drinking 12 beers and 1/5 of liquor per week. He denies cravings. He has had a few DUIs in the past.  A/P: - Provided counseling on the health risks of continued alcohol use in the setting of liver cirrhosis and epilepsy.  - Advised cessation from alcohol - Prescribed naltrexone 50 mg once daily

## 2019-01-24 NOTE — Assessment & Plan Note (Addendum)
Patient with uncontrolled hypertension. He brought in all his medications today. One of which was amlodipine. He states that he is not been taking this. We discussed the risks of untreated hypertension including CVA, CKD, and MI. He voices understanding.  He also drinks a significant amount of alcohol which is likely playing a role in his hypertension. He consumes approximately 12 beers and 1/5 of liquor per week.  A/P: - Although not an ideal agent we will restart diltiazem 120 mg q.d. to treat both his hypertension and his inappropriate sinus tachycardia. - Discussed the effects of alcohol on blood pressure. Advised cessation.

## 2019-01-24 NOTE — Assessment & Plan Note (Signed)
Patient with a reported history of epilepsy. He states that he had his first seizure approximately 15 to 20 years ago. Since that time he has been on phenytoin 300 mg daily. He states that he does take this medication consistently. He was seen in the emergency department on 11/30 for seizure like activity. He is unsure about the events surrounding this. He is not sure if he had gone a couple days without drinking. He does not believe that he was ill at the time. Phenytoin level drawn in the ED was in therapeutic range. He was loaded with Keppra and prescribed Keppra 500 mg BID. He is not started the [place]. He is continued to take the phenytoin.  A/P: - Very difficult to determine whether the patient actually has epilepsy in the setting of his alcohol use. It is also unclear as to whether the event on 11/30 was provoked or unprovoked. - Continue phenytoin 300 mg q.d. and start Keppra 500 mg BID - Referral to neurology - Discussed the [place] law prohibits driving until the patient is seizure free for approximately six months. He voices understanding.

## 2019-01-31 ENCOUNTER — Encounter: Payer: Self-pay | Admitting: *Deleted

## 2019-03-05 NOTE — Addendum Note (Signed)
Addended by: Hulan Fray on: 03/05/2019 06:23 PM   Modules accepted: Orders

## 2019-03-26 ENCOUNTER — Ambulatory Visit: Payer: Self-pay | Admitting: Internal Medicine

## 2019-04-16 ENCOUNTER — Ambulatory Visit (INDEPENDENT_AMBULATORY_CARE_PROVIDER_SITE_OTHER): Payer: Self-pay | Admitting: Internal Medicine

## 2019-04-16 ENCOUNTER — Encounter: Payer: Self-pay | Admitting: Internal Medicine

## 2019-04-16 ENCOUNTER — Other Ambulatory Visit: Payer: Self-pay

## 2019-04-16 VITALS — BP 129/77 | HR 96 | Temp 98.4°F | Ht 65.0 in | Wt 107.4 lb

## 2019-04-16 DIAGNOSIS — G40909 Epilepsy, unspecified, not intractable, without status epilepticus: Secondary | ICD-10-CM

## 2019-04-16 DIAGNOSIS — F1020 Alcohol dependence, uncomplicated: Secondary | ICD-10-CM

## 2019-04-16 DIAGNOSIS — I10 Essential (primary) hypertension: Secondary | ICD-10-CM

## 2019-04-16 DIAGNOSIS — Z79899 Other long term (current) drug therapy: Secondary | ICD-10-CM

## 2019-04-16 DIAGNOSIS — R42 Dizziness and giddiness: Secondary | ICD-10-CM

## 2019-04-16 DIAGNOSIS — E785 Hyperlipidemia, unspecified: Secondary | ICD-10-CM

## 2019-04-16 NOTE — Patient Instructions (Addendum)
[name],  It was a pleasure seeing you in clinic today. Today, we discussed:   Dizziness: You have been having dizziness for several months that seems to be more prevalent on standing. At this time, I believe that your symptoms are related to continued alcohol use with naltrexone. Please take your naltrexone daily as prescribed. I will place a referral to help get you resources for rehab programs. If your symptoms persist or become worse, please contact us.   Please contact us if you have any questions or concerns.  Thank you!     Hiccups  A hiccup is the result of a sudden irritation of a muscle that is used for breathing (diaphragm). The diaphragm is located under your lungs and above your stomach. When the diaphragm gets irritated, it may quickly tighten without your control (have a spasm). The spasm causes you to quickly suck in air, and that causes your vocal cords to close together quickly. These reactions cause the hiccup sound. Hiccups usually last only a short amount of time (less than 48 hours). In unusual cases, they can last for days or months and require you to see your health care provider. Common causes of hiccups include:  Eating too quickly.  Eating too much food.  Drinking alcohol or bubbly (carbonated) drinks.  Eating or drinking hot or spicy foods and drinks.  Swallowing extra air when sucking on candy or a straw or chewing on gum.  Feeling nervous, stressed, or excited.  Having certain conditions that irritate the diaphragm nerves.  Having metabolic or nervous system disorders. Follow these instructions at home:  Watch your hiccups for any changes.  To prevent hiccups or to lessen discomfort from hiccups: ? Eat and chew your food slowly. ? Eat small meals, and avoid overeating. ? Limit alcohol intake to no more than 1 drink a day for nonpregnant women and 2 drinks a day for men. One drink equals 12 oz of beer, 5 oz of wine, or 1 oz of hard  liquor. ? Limit your drinking of carbonated or fizzy drinks, such as soda. ? Avoid eating or drinking hot or spicy foods and drinks.  Take over-the-counter and prescription medicines only as told by your health care provider. Contact a health care provider if:  Your hiccups last for more than 48 hours.  Your hiccups do not improve with treatment.  You cannot sleep or eat because of the hiccups.  You have unexpected weight loss due to the hiccups.  You have numbness, tingling, or weakness. Get help right away if:  You have trouble breathing or swallowing.  You have severe pain in your abdomen. Summary  A hiccup is the result of a sudden irritation of a muscle that is used for breathing (diaphragm).  Hiccups can be caused by many things, including eating too quickly.  See your health care provider if your hiccups last for more than 48 hours. This information is not intended to replace advice given to you by your health care provider. Make sure you discuss any questions you have with your health care provider. Document Revised: 01/13/2017 Document Reviewed: 10/10/2016 Elsevier Patient Education  2020 [employer].

## 2019-04-16 NOTE — Progress Notes (Signed)
   CC: dizziness  HPI:  [name] is a 54 y.o. male with PMHx of alcohol use disorder, hypertension, hyperlipidemia and seizures presenting with several weeks of dizziness that is worse with standing. Please see problem based charting for further assessment and plan.   Past Medical History:  Diagnosis Date  . Alcohol abuse   . Alcohol related seizure ([place])   . Allergy   . Hiccups   . HTN (hypertension)   . Hyperlipidemia   . Seizures ([place]) 03/10/2014   Review of Systems:  Review of Systems  Constitutional: Negative for chills, diaphoresis, fever and malaise/fatigue.  Eyes: Negative for blurred vision, double vision, photophobia and pain.  Respiratory: Negative for cough, shortness of breath and wheezing.   Cardiovascular: Negative for chest pain, palpitations and PND.  Gastrointestinal: Negative for abdominal pain, constipation, diarrhea, nausea and vomiting.  Genitourinary: Negative for dysuria and urgency.  Musculoskeletal: Negative for falls, myalgias and neck pain.  Neurological: Positive for dizziness. Negative for sensory change, speech change, focal weakness, weakness and headaches.   Physical Exam:  Vitals:   04/16/19 1613  BP: 129/77  Pulse: 96  Temp: 98.4 F (36.9 C)  TempSrc: Oral  SpO2: 98%  Weight: 107 lb 6.4 oz (48.7 kg)  Height: 5\' 5"  (1.651 m)   Physical Exam Vitals reviewed.  Constitutional:      General: He is not in acute distress.    Appearance: Normal appearance. He is not diaphoretic.  HENT:     Head: Normocephalic and atraumatic.     Mouth/Throat:     Mouth: Mucous membranes are moist.     Pharynx: Oropharynx is clear.  Eyes:     General: No scleral icterus.    Extraocular Movements: Extraocular movements intact.     Conjunctiva/sclera: Conjunctivae normal.     Pupils: Pupils are equal, round, and reactive to light.  Cardiovascular:     Rate and Rhythm: Normal rate and regular rhythm.     Pulses: Normal pulses.     Heart sounds:  Normal heart sounds.  Pulmonary:     Effort: Pulmonary effort is normal. No respiratory distress.     Breath sounds: Normal breath sounds. No wheezing or rales.  Abdominal:     General: Abdomen is flat. Bowel sounds are normal. There is no distension.     Palpations: Abdomen is soft.     Tenderness: There is no abdominal tenderness. There is no guarding.  Musculoskeletal:        General: No swelling, tenderness or deformity. Normal range of motion.     Cervical back: Normal range of motion and neck supple.  Skin:    General: Skin is warm and dry.     Capillary Refill: Capillary refill takes less than 2 seconds.  Neurological:     General: No focal deficit present.     Mental Status: He is alert and oriented to person, place, and time.     Cranial Nerves: No cranial nerve deficit.     Sensory: No sensory deficit.     Motor: No weakness.     Gait: Gait normal.    Assessment & Plan:   See Encounters Tab for problem based charting.  Patient discussed with Dr. Lynnae January

## 2019-04-18 NOTE — Assessment & Plan Note (Signed)
Patient with alcohol use disorder. He is on naltrexone but notes that he takes this every third day. He is continuing to drink approximately 3 beers nightly. He notes that he has had some dizziness, especially with standing that lasts for about 5 seconds at a time. He notes that this has been occurring for several months now. He does endorse some blurry vision but denies any relationship to the episodes of standing. He notes that he has been eating well, does not endorse any decreased appetite or recent weight loss, no headaches, no sensory changes or focal deficits. On examination, neurologic exam is grossly intact. No murmurs appreciated on auscultation. Orthostatics negative.  Patient is on naltrexone and continues to use alcohol. Counseled patient on taking naltrexone daily as prescribed and alcohol cessation as it can increase risk of seizures and cirrhosis. Suspect that his symptoms are secondary to ongoing alcohol use with naltrexone. Patient notes that he has previously tried alcohol rehab centers which helped him quit drinking for approximately 6 months at a time and he would like to try this again.   Plan:  - Counseled on health risks of continued alcohol use  - Continue naltrexone 50mg  daily - Referral to chronic care management for possible alcohol rehabilitation centers

## 2019-04-18 NOTE — Assessment & Plan Note (Signed)
Patient with history of epilepsy with first seizure being 15-20 years ago. He is on phenytoin 300mg  daily and Keppra 500mg  bid (recently started in December 2020). He notes that he is compliant with his medications and does not have any issues in getting his prescriptions. Patient was referred to neurology at last visit; however, he notes that he has not been able to follow up due to insurance issues. Reiterated that patient until end of May given concerns for seizure like activity on 11/30.   Plan:  - Neurology referral pending insurance  - Continue phenytoin 300mg  daily and Keppra 500mg  bid

## 2019-04-22 NOTE — Progress Notes (Signed)
Internal Medicine Clinic Attending  Case discussed with Dr. Aslam at the time of the visit.  We reviewed the resident's history and exam and pertinent patient test results.  I agree with the assessment, diagnosis, and plan of care documented in the resident's note.  

## 2019-05-07 ENCOUNTER — Ambulatory Visit: Payer: Self-pay

## 2019-05-07 NOTE — Chronic Care Management (AMB) (Signed)
  Chronic Care Management   Outreach Note  05/07/2019 Name: Joseph Underwood MRN: [id number] DOB: 03/17/1965  Referred by: [name], MD Reason for referral : Care Coordination (Epilepsy, ETOH Use Disorder)   An unsuccessful telephone outreach was attempted today. The patient was referred to the case management team for assistance with care management and care coordination.   Follow Up Plan: A HIPPA compliant phone message was left for the patient providing contact information and requesting a return call.   Patient is scheduled for financial counseling at clinic on 05/08/19.  If patient attends appointment, RNCM Kelli Churn, will inform him that CCM Social Worker is attempting to contact him regarding referral.   Will attempt to reach patient again within the next 10 days if no return call from him.       Ronn Melena, [place] Coordination Social Worker [place] [phone]

## 2019-05-08 ENCOUNTER — Ambulatory Visit: Payer: Self-pay

## 2019-05-14 ENCOUNTER — Ambulatory Visit: Payer: Self-pay

## 2019-05-14 NOTE — Chronic Care Management (AMB) (Signed)
  Chronic Care Management   Outreach Note  05/14/2019 Name: Joseph Underwood MRN: [id number] DOB: 1965-04-09  Referred by: [name], MD Reason for referral : Care Coordination (Epilepsy, ETOH Use Disorder)   A second unsuccessful telephone outreach was attempted today. The patient was referred to the case management team for assistance with care management and care coordination.   Follow Up Plan: A HIPPA compliant phone message was left for the patient providing contact information and requesting a return call.    Ronn Melena, [place] Coordination Social Worker [place] [phone]

## 2019-05-21 ENCOUNTER — Ambulatory Visit: Payer: Self-pay

## 2019-05-21 NOTE — Chronic Care Management (AMB) (Signed)
  Chronic Care Management   Outreach Note  05/21/2019 Name: Joseph Underwood MRN: [id number] DOB: Jun 22, 1965  Referred by: [name], MD Reason for referral : Care Coordination (Epilepsy, ETOH Use Disorder)   Third unsuccessful telephone outreach was attempted today. The patient was referred to the case management team for assistance with care management and care coordination. The patient's primary care provider has been notified of our unsuccessful attempts to make or maintain contact with the patient. The care management team is pleased to engage with this patient at any time in the future should he/she be interested in assistance from the care management team.   Follow Up Plan: The care management team is available to follow up with the patient after provider conversation with the patient regarding recommendation for care management engagement and subsequent re-referral to the care management team.       Ronn Melena, [place] Worker [place] [phone]

## 2019-06-21 ENCOUNTER — Encounter (HOSPITAL_COMMUNITY): Payer: Self-pay | Admitting: Emergency Medicine

## 2019-06-21 ENCOUNTER — Emergency Department (HOSPITAL_COMMUNITY)
Admission: EM | Admit: 2019-06-21 | Discharge: 2019-06-22 | Disposition: A | Discharge status: Home / Self Care | Payer: Self-pay | Attending: Emergency Medicine | Admitting: Emergency Medicine

## 2019-06-21 DIAGNOSIS — F17210 Nicotine dependence, cigarettes, uncomplicated: Secondary | ICD-10-CM | POA: Insufficient documentation

## 2019-06-21 DIAGNOSIS — G40909 Epilepsy, unspecified, not intractable, without status epilepticus: Secondary | ICD-10-CM

## 2019-06-21 DIAGNOSIS — R569 Unspecified convulsions: Secondary | ICD-10-CM | POA: Insufficient documentation

## 2019-06-21 DIAGNOSIS — Z79899 Other long term (current) drug therapy: Secondary | ICD-10-CM | POA: Insufficient documentation

## 2019-06-21 DIAGNOSIS — I10 Essential (primary) hypertension: Secondary | ICD-10-CM | POA: Insufficient documentation

## 2019-06-21 MED ORDER — PHENYTOIN SODIUM EXTENDED 100 MG PO CAPS: 300.0000 mg | ORAL_CAPSULE | Freq: Once | ORAL | Status: DC

## 2019-06-21 NOTE — ED Triage Notes (Addendum)
Pt transported from home by EMS for reports of seizure today, hx of same, pt forgot to take Dilantin last night. No injuries reported.  IV est by EMS. A & O  Pt reports he did not take his Dilantin but did take his Keppra as directed.  Dilantin ordered while waiting in [place]

## 2019-06-22 ENCOUNTER — Encounter (HOSPITAL_COMMUNITY): Payer: Self-pay | Admitting: Emergency Medicine

## 2019-06-22 LAB — I-STAT CHEM 8, ED
BUN: 21 mg/dL — ABNORMAL HIGH (ref 6–20)
Calcium, Ion: 0.91 mmol/L — ABNORMAL LOW (ref 1.15–1.40)
Chloride: 93 mmol/L — ABNORMAL LOW (ref 98–111)
Creatinine, Ser: 1.4 mg/dL — ABNORMAL HIGH (ref 0.61–1.24)
Glucose, Bld: 89 mg/dL (ref 70–99)
HCT: 37 % — ABNORMAL LOW (ref 39.0–52.0)
Hemoglobin: 12.6 g/dL — ABNORMAL LOW (ref 13.0–17.0)
Potassium: 3.1 mmol/L — ABNORMAL LOW (ref 3.5–5.1)
Sodium: 133 mmol/L — ABNORMAL LOW (ref 135–145)
TCO2: 31 mmol/L (ref 22–32)

## 2019-06-22 LAB — CBC WITH DIFFERENTIAL/PLATELET
Abs Immature Granulocytes: 0.05 10*3/uL (ref 0.00–0.07)
Basophils Absolute: 0 10*3/uL (ref 0.0–0.1)
Basophils Relative: 0 %
Eosinophils Absolute: 0 10*3/uL (ref 0.0–0.5)
Eosinophils Relative: 0 %
HCT: 35.1 % — ABNORMAL LOW (ref 39.0–52.0)
Hemoglobin: 12.4 g/dL — ABNORMAL LOW (ref 13.0–17.0)
Immature Granulocytes: 1 %
Lymphocytes Relative: 18 %
Lymphs Abs: 1.4 10*3/uL (ref 0.7–4.0)
MCH: 34.2 pg — ABNORMAL HIGH (ref 26.0–34.0)
MCHC: 35.3 g/dL (ref 30.0–36.0)
MCV: 96.7 fL (ref 80.0–100.0)
Monocytes Absolute: 0.9 10*3/uL (ref 0.1–1.0)
Monocytes Relative: 11 %
Neutro Abs: 5.4 10*3/uL (ref 1.7–7.7)
Neutrophils Relative %: 70 %
Platelets: 174 10*3/uL (ref 150–400)
RBC: 3.63 MIL/uL — ABNORMAL LOW (ref 4.22–5.81)
RDW: 13.1 % (ref 11.5–15.5)
WBC: 7.8 10*3/uL (ref 4.0–10.5)
nRBC: 0 % (ref 0.0–0.2)

## 2019-06-22 LAB — PHENYTOIN LEVEL, TOTAL: Phenytoin Lvl: <2.5 ug/mL — ABNORMAL LOW (ref 10.0–20.0)

## 2019-06-22 LAB — CBG MONITORING, ED: Glucose-Capillary: 84 mg/dL (ref 70–99)

## 2019-06-22 MED ORDER — LEVETIRACETAM 500 MG PO TABS
500.0000 mg | ORAL_TABLET | Freq: Two times a day (BID) | ORAL | 0 refills | Status: DC
Start: 2019-06-22 — End: 2019-08-28

## 2019-06-22 MED ORDER — PHENYTOIN SODIUM EXTENDED 100 MG PO CAPS
300.0000 mg | ORAL_CAPSULE | Freq: Once | ORAL | Status: AC
  Administered 2019-06-22: 300 mg via ORAL
  Filled 2019-06-22: qty 3

## 2019-06-22 MED ORDER — LEVETIRACETAM IN NACL 1000 MG/100ML IV SOLN
1000.0000 mg | Freq: Once | INTRAVENOUS | Status: AC
  Administered 2019-06-22: 02:00:00 1000 mg via INTRAVENOUS
  Filled 2019-06-22: qty 100

## 2019-06-22 MED ORDER — POTASSIUM CHLORIDE CRYS ER 20 MEQ PO TBCR
40.0000 meq | EXTENDED_RELEASE_TABLET | Freq: Once | ORAL | Status: AC
  Administered 2019-06-22: 04:00:00 40 meq via ORAL
  Filled 2019-06-22: qty 2

## 2019-06-22 MED ORDER — HYDRALAZINE HCL 20 MG/ML IJ SOLN
5.0000 mg | Freq: Once | INTRAMUSCULAR | Status: AC
  Administered 2019-06-22: 5 mg via INTRAVENOUS
  Filled 2019-06-22: qty 1

## 2019-06-22 NOTE — Discharge Instructions (Signed)
No driving until 6 months seizure free or cleared by neurology

## 2019-06-22 NOTE — ED Provider Notes (Signed)
[facility] EMERGENCY DEPARTMENT Provider Note   CSN: [id number] Arrival date & time: 06/21/19  2130     History Chief Complaint  Patient presents with  . Seizures    Joseph Underwood is a 54 y.o. male.  The history is provided by the patient. The history is limited by the condition of the patient.  Seizures Seizure activity on arrival: no   Seizure type:  Grand mal Preceding symptoms: no nausea   Initial focality:  None Episode characteristics: generalized shaking   Postictal symptoms: confusion   Return to baseline: yes   Severity:  Moderate Timing:  Clustered Progression:  Resolved Context: alcohol withdrawal   Recent head injury:  No recent head injuries PTA treatment:  None History of seizures: yes        Past Medical History:  Diagnosis Date  . Alcohol abuse   . Alcohol related seizure ([place])   . Allergy   . Hiccups   . HTN (hypertension)   . Hyperlipidemia   . Seizures ([place]) 03/10/2014    Patient Active Problem List   Diagnosis Date Noted  . Epilepsy ([place]) 01/24/2019  . Alcohol withdrawal ([place]) 01/10/2018  . Erectile dysfunction 04/13/2016  . Preventative health care 04/12/2016  . 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 Tachycardia 03/28/2014  . Liver cirrhosis ([place]) 03/22/2014  . Hypertriglyceridemia 04/24/2013  . Tobacco use 04/24/2013  . Essential hypertension, benign 01/01/2013  . Alcohol use disorder, moderate, dependence ([place]) 04/13/2006  . Alcohol withdrawal seizure ([place]) 04/13/2006    Past Surgical History:  Procedure Laterality Date  . NO PAST SURGERIES         Family History  Problem Relation Age of Onset  . Hypertension Mother   . Colon cancer Neg Hx   . Esophageal cancer Neg Hx   . Rectal cancer Neg Hx   . Stomach cancer Neg Hx     Social History   Tobacco Use  . Smoking status: Current Every Day Smoker    Packs/day: 0.30    Years: 37.00    Pack years: 11.10    Types: Cigarettes  .  Smokeless tobacco: Never Used  . Tobacco comment: 4 -5 cigs per day  Substance Use Topics  . Alcohol use: Yes    Alcohol/week: 88.0 standard drinks    Types: 14 Cans of beer, 74 Shots of liquor per week    Comment: Beer 40oz 4 days a week  . Drug use: No    Home Medications Prior to Admission medications   Medication Sig Start Date End Date Taking? Authorizing Provider  fenofibrate (TRICOR) 145 MG tablet Take 1 tablet (145 mg total) by mouth daily. IM program 01/23/19  Yes Helberg, Larkin Ina, MD  naltrexone (DEPADE) 50 MG tablet Take 1 tablet (50 mg total) by mouth daily. IM Program. 01/23/19  Yes Ina Homes, MD  phenytoin (DILANTIN) 300 MG ER capsule Take 1 capsule (300 mg total) by mouth daily. 01/23/19  Yes Helberg, Larkin Ina, MD  tetrahydrozoline 0.05 % ophthalmic solution Place 2 drops into both eyes daily as needed.   Yes [provider]  atorvastatin (LIPITOR) 20 MG tablet Take 1 tablet (20 mg total) by mouth daily. IM program. Patient not taking: Reported on 06/22/2019 01/23/19   Ina Homes, MD  diltiazem (CARDIZEM CD) 120 MG 24 hr capsule Take 1 capsule (120 mg total) by mouth daily. Patient not taking: Reported on 06/22/2019 01/23/19 01/23/20  Ina Homes, MD  levETIRAcetam (KEPPRA) 500 MG tablet Take 1  tablet (500 mg total) by mouth 2 (two) times daily. Patient not taking: Reported on 06/22/2019 01/23/19   Ina Homes, MD  omeprazole (PRILOSEC) 20 MG capsule Take 1 capsule (20 mg total) by mouth daily for 14 days. Patient not taking: Reported on 06/22/2019 01/23/19 02/06/19  Ina Homes, MD    Allergies    Patient has no known allergies.  Review of Systems   Review of Systems  Constitutional: Negative for fever.  HENT: Negative for congestion.   Eyes: Negative for visual disturbance.  Respiratory: Negative for shortness of breath.   Cardiovascular: Negative for leg swelling.  Gastrointestinal: Negative for abdominal pain.  Genitourinary: Negative for  difficulty urinating.  Musculoskeletal: Negative for arthralgias.  Neurological: Positive for seizures.  Psychiatric/Behavioral: Negative for agitation.  All other systems reviewed and are negative.   Physical Exam Updated Vital Signs BP (!) 163/109   Pulse (!) 104   Temp 98.7 F (37.1 C) (Oral)   Resp 16   SpO2 96%   Physical Exam Vitals and nursing note reviewed.  Constitutional:      General: He is not in acute distress.    Appearance: Normal appearance.  HENT:     Head: Normocephalic and atraumatic.     Nose: Nose normal.  Eyes:     Conjunctiva/sclera: Conjunctivae normal.     Pupils: Pupils are equal, round, and reactive to light.  Cardiovascular:     Rate and Rhythm: Normal rate and regular rhythm.     Pulses: Normal pulses.     Heart sounds: Normal heart sounds.  Pulmonary:     Effort: Pulmonary effort is normal.     Breath sounds: Normal breath sounds.  Abdominal:     General: Abdomen is flat. Bowel sounds are normal.     Tenderness: There is no abdominal tenderness. There is no guarding.  Musculoskeletal:        General: Normal range of motion.     Cervical back: Normal range of motion and neck supple.  Skin:    General: Skin is warm and dry.     Capillary Refill: Capillary refill takes less than 2 seconds.  Neurological:     General: No focal deficit present.     Mental Status: He is alert and oriented to person, place, and time.     Deep Tendon Reflexes: Reflexes normal.  Psychiatric:        Mood and Affect: Mood normal.        Behavior: Behavior normal.        Thought Content: Thought content normal.     ED Results / Procedures / Treatments   Labs (all labs ordered are listed, but only abnormal results are displayed) Results for orders placed or performed during the hospital encounter of 06/21/19  CBC with Differential/Platelet  Result Value Ref Range   WBC 7.8 4.0 - 10.5 K/uL   RBC 3.63 (L) 4.22 - 5.81 MIL/uL   Hemoglobin 12.4 (L) 13.0 - 17.0  g/dL   HCT 35.1 (L) 39.0 - 52.0 %   MCV 96.7 80.0 - 100.0 fL   MCH 34.2 (H) 26.0 - 34.0 pg   MCHC 35.3 30.0 - 36.0 g/dL   RDW 13.1 11.5 - 15.5 %   Platelets 174 150 - 400 K/uL   nRBC 0.0 0.0 - 0.2 %   Neutrophils Relative % 70 %   Neutro Abs 5.4 1.7 - 7.7 K/uL   Lymphocytes Relative 18 %   Lymphs Abs 1.4 0.7 - 4.0  K/uL   Monocytes Relative 11 %   Monocytes Absolute 0.9 0.1 - 1.0 K/uL   Eosinophils Relative 0 %   Eosinophils Absolute 0.0 0.0 - 0.5 K/uL   Basophils Relative 0 %   Basophils Absolute 0.0 0.0 - 0.1 K/uL   Immature Granulocytes 1 %   Abs Immature Granulocytes 0.05 0.00 - 0.07 K/uL  Phenytoin level, total  Result Value Ref Range   Phenytoin Lvl <2.5 (L) 10.0 - 20.0 ug/mL  CBG monitoring, ED  Result Value Ref Range   Glucose-Capillary 84 70 - 99 mg/dL  I-stat chem 8, ED (not at [facility] or [facility])  Result Value Ref Range   Sodium 133 (L) 135 - 145 mmol/L   Potassium 3.1 (L) 3.5 - 5.1 mmol/L   Chloride 93 (L) 98 - 111 mmol/L   BUN 21 (H) 6 - 20 mg/dL   Creatinine, Ser 1.40 (H) 0.61 - 1.24 mg/dL   Glucose, Bld 89 70 - 99 mg/dL   Calcium, Ion 0.91 (L) 1.15 - 1.40 mmol/L   TCO2 31 22 - 32 mmol/L   Hemoglobin 12.6 (L) 13.0 - 17.0 g/dL   HCT 37.0 (L) 39.0 - 52.0 %   No results found.  Radiology No results found.  Procedures Procedures (including critical care time)  Medications Ordered in ED Medications  phenytoin (DILANTIN) ER capsule 300 mg (300 mg Oral Not Given 06/22/19 0346)  levETIRAcetam (KEPPRA) IVPB 1000 mg/100 mL premix (0 mg Intravenous Stopped 06/22/19 0239)  hydrALAZINE (APRESOLINE) injection 5 mg (5 mg Intravenous Given 06/22/19 0315)  phenytoin (DILANTIN) ER capsule 300 mg (300 mg Oral Given 06/22/19 0338)  potassium chloride SA (KLOR-CON) CR tablet 40 mEq (40 mEq Oral Given 06/22/19 [id number])    ED Course  I have reviewed the triage vital signs and the nursing notes.  Pertinent labs & imaging results that were available during my care of the patient were  reviewed by me and considered in my medical decision making (see chart for details).    Observed in the ED, no further activity.  Reloaded with medication.  I suspect non compliance.  Will start keppra and refer to neurology.  Patient informed no driving until 6 months seizure free.  Follow up with neurology.  Take all medication as directed.    Joseph Underwood was evaluated in Emergency Department on 06/22/2019 for the symptoms described in the history of present illness. He was evaluated in the context of the global COVID-19 pandemic, which necessitated consideration that the patient might be at risk for infection with the SARS-CoV-2 virus that causes COVID-19. Institutional protocols and algorithms that pertain to the evaluation of patients at risk for COVID-19 are in a state of rapid change based on information released by regulatory bodies including the CDC and federal and state organizations. These policies and algorithms were followed during the patient's care in the ED.  Final Clinical Impression(s) / ED Diagnoses Return for intractable cough, coughing up blood,fevers >100.4 unrelieved by medication, shortness of breath, intractable vomiting, chest pain, shortness of breath, weakness,numbness, changes in speech, facial asymmetry,abdominal pain, passing out,Inability to tolerate liquids or food, cough, altered mental status or any concerns. No signs of systemic illness or infection. The patient is nontoxic-appearing on exam and vital signs are within normal limits.   I have reviewed the triage vital signs and the nursing notes. Pertinent labs &imaging results that were available during my care of the patient were reviewed by me and considered in my medical decision making (  see chart for details).After history, exam, and medical workup I feel the patient has beenappropriately medically screened and is safe for discharge home. Pertinent diagnoses were discussed with the patient. Patient was  given return precautions.    Knight Oelkers, MD 06/22/19 0630

## 2019-06-22 NOTE — ED Notes (Signed)
Have patient to call Mom when he is available

## 2019-06-22 NOTE — ED Notes (Signed)
Patient tolerated oral fluids with oral medications.

## 2019-07-08 ENCOUNTER — Other Ambulatory Visit: Payer: Self-pay

## 2019-07-08 ENCOUNTER — Encounter (HOSPITAL_COMMUNITY): Payer: Self-pay | Admitting: Emergency Medicine

## 2019-07-08 ENCOUNTER — Emergency Department (HOSPITAL_COMMUNITY): Payer: Self-pay

## 2019-07-08 ENCOUNTER — Emergency Department (HOSPITAL_COMMUNITY)
Admission: EM | Admit: 2019-07-08 | Discharge: 2019-07-08 | Disposition: A | Discharge status: Home / Self Care | Payer: Self-pay | Attending: Emergency Medicine | Admitting: Emergency Medicine

## 2019-07-08 DIAGNOSIS — Y93H2 Activity, gardening and landscaping: Secondary | ICD-10-CM | POA: Insufficient documentation

## 2019-07-08 DIAGNOSIS — X503XXA Overexertion from repetitive movements, initial encounter: Secondary | ICD-10-CM | POA: Insufficient documentation

## 2019-07-08 DIAGNOSIS — I10 Essential (primary) hypertension: Secondary | ICD-10-CM | POA: Insufficient documentation

## 2019-07-08 DIAGNOSIS — Z79899 Other long term (current) drug therapy: Secondary | ICD-10-CM | POA: Insufficient documentation

## 2019-07-08 DIAGNOSIS — S46812A Strain of other muscles, fascia and tendons at shoulder and upper arm level, left arm, initial encounter: Secondary | ICD-10-CM | POA: Insufficient documentation

## 2019-07-08 DIAGNOSIS — Y999 Unspecified external cause status: Secondary | ICD-10-CM | POA: Insufficient documentation

## 2019-07-08 DIAGNOSIS — F17210 Nicotine dependence, cigarettes, uncomplicated: Secondary | ICD-10-CM | POA: Insufficient documentation

## 2019-07-08 DIAGNOSIS — Y92007 Garden or yard of unspecified non-institutional (private) residence as the place of occurrence of the external cause: Secondary | ICD-10-CM | POA: Insufficient documentation

## 2019-07-08 DIAGNOSIS — M436 Torticollis: Secondary | ICD-10-CM | POA: Insufficient documentation

## 2019-07-08 MED ORDER — KETOROLAC TROMETHAMINE 60 MG/2ML IM SOLN
15.0000 mg | Freq: Once | INTRAMUSCULAR | Status: AC
  Administered 2019-07-08: 15 mg via INTRAMUSCULAR
  Filled 2019-07-08: qty 2

## 2019-07-08 MED ORDER — ACETAMINOPHEN 500 MG PO TABS
1000.0000 mg | ORAL_TABLET | Freq: Once | ORAL | Status: AC
  Administered 2019-07-08: 1000 mg via ORAL
  Filled 2019-07-08: qty 2

## 2019-07-08 MED ORDER — OXYCODONE HCL 5 MG PO TABS
5.0000 mg | ORAL_TABLET | Freq: Once | ORAL | Status: AC
  Administered 2019-07-08: 5 mg via ORAL
  Filled 2019-07-08: qty 1

## 2019-07-08 NOTE — Discharge Instructions (Signed)
Take 4 over the counter ibuprofen tablets 3 times a day or 2 over-the-counter naproxen tablets twice a day for pain. Also take tylenol 1000mg (2 extra strength) four times a day.   You need to take your shoulder out of the sling at least 4 times a day and do range of motion exercises.

## 2019-07-08 NOTE — ED Provider Notes (Signed)
[facility] EMERGENCY DEPARTMENT Provider Note   CSN: [id number] Arrival date & time: 07/08/19  [id number]     History Chief Complaint  Patient presents with  . Neck Pain  . Shoulder Pain    Joseph Underwood is a 54 y.o. male.  54 yo M with a chief complaints of left neck pain.  Going on for the past couple days.  Denies trauma.  States that he did some lawn more work just before.  Denies fever denies numbness or weakness to the left arm.  Feels that he has pain significantly when he tries to turn his head one way or the other.  Has some trouble lifting his arm up above about 90 degrees.   The history is provided by the patient.  Neck Pain Associated symptoms: no chest pain, no fever and no headaches   Shoulder Pain Associated symptoms: neck pain   Associated symptoms: no fever   Injury This is a new problem. The current episode started 2 days ago. The problem occurs constantly. The problem has not changed since onset.Pertinent negatives include no chest pain, no abdominal pain, no headaches and no shortness of breath. The symptoms are aggravated by bending and twisting. Nothing relieves the symptoms. He has tried nothing for the symptoms. The treatment provided no relief.       Past Medical History:  Diagnosis Date  . Alcohol abuse   . Alcohol related seizure ([place])   . Allergy   . Hiccups   . HTN (hypertension)   . Hyperlipidemia   . Seizures ([place]) 03/10/2014    Patient Active Problem List   Diagnosis Date Noted  . Epilepsy ([place]) 01/24/2019  . Alcohol withdrawal ([place]) 01/10/2018  . Erectile dysfunction 04/13/2016  . Preventative health care 04/12/2016  . 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 Tachycardia 03/28/2014  . Liver cirrhosis ([place]) 03/22/2014  . Hypertriglyceridemia 04/24/2013  . Tobacco use 04/24/2013  . Essential hypertension, benign 01/01/2013  . Alcohol use disorder, moderate, dependence ([place]) 04/13/2006  . Alcohol withdrawal  seizure ([place]) 04/13/2006    Past Surgical History:  Procedure Laterality Date  . NO PAST SURGERIES         Family History  Problem Relation Age of Onset  . Hypertension Mother   . Colon cancer Neg Hx   . Esophageal cancer Neg Hx   . Rectal cancer Neg Hx   . Stomach cancer Neg Hx     Social History   Tobacco Use  . Smoking status: Current Every Day Smoker    Packs/day: 0.30    Years: 37.00    Pack years: 11.10    Types: Cigarettes  . Smokeless tobacco: Never Used  . Tobacco comment: 4 -5 cigs per day  Substance Use Topics  . Alcohol use: Yes    Alcohol/week: 88.0 standard drinks    Types: 14 Cans of beer, 74 Shots of liquor per week    Comment: Beer 40oz 4 days a week  . Drug use: No    Home Medications Prior to Admission medications   Medication Sig Start Date End Date Taking? Authorizing Provider  atorvastatin (LIPITOR) 20 MG tablet Take 1 tablet (20 mg total) by mouth daily. IM program. Patient not taking: Reported on 06/22/2019 01/23/19   Ina Homes, MD  diltiazem (CARDIZEM CD) 120 MG 24 hr capsule Take 1 capsule (120 mg total) by mouth daily. Patient not taking: Reported on 06/22/2019 01/23/19 01/23/20  Ina Homes, MD  fenofibrate (TRICOR) 145 MG  tablet Take 1 tablet (145 mg total) by mouth daily. IM program 01/23/19   Ina Homes, MD  levETIRAcetam (KEPPRA) 500 MG tablet Take 1 tablet (500 mg total) by mouth 2 (two) times daily. Patient not taking: Reported on 06/22/2019 01/23/19   Ina Homes, MD  levETIRAcetam (KEPPRA) 500 MG tablet Take 1 tablet (500 mg total) by mouth 2 (two) times daily. 06/22/19   Palumbo, April, MD  naltrexone (DEPADE) 50 MG tablet Take 1 tablet (50 mg total) by mouth daily. IM Program. 01/23/19   Ina Homes, MD  omeprazole (PRILOSEC) 20 MG capsule Take 1 capsule (20 mg total) by mouth daily for 14 days. Patient not taking: Reported on 06/22/2019 01/23/19 02/06/19  Ina Homes, MD  phenytoin (DILANTIN) 300 MG ER capsule Take 1  capsule (300 mg total) by mouth daily. 01/23/19   Ina Homes, MD  tetrahydrozoline 0.05 % ophthalmic solution Place 2 drops into both eyes daily as needed.    [provider]    Allergies    Patient has no known allergies.  Review of Systems   Review of Systems  Constitutional: Negative for chills and fever.  HENT: Negative for congestion and facial swelling.   Eyes: Negative for discharge and visual disturbance.  Respiratory: Negative for shortness of breath.   Cardiovascular: Negative for chest pain and palpitations.  Gastrointestinal: Negative for abdominal pain, diarrhea and vomiting.  Musculoskeletal: Positive for neck pain. Negative for arthralgias and myalgias.  Skin: Negative for color change and rash.  Neurological: Negative for tremors, syncope and headaches.  Psychiatric/Behavioral: Negative for confusion and dysphoric mood.  54 yo   Physical Exam Updated Vital Signs BP (!) 143/113 (BP Location: Right Arm)   Pulse (!) 111   Temp 98.3 F (36.8 C) (Oral)   Resp 16   Ht 5\' 5"  (1.651 m)   Wt 49.9 kg   SpO2 98%   BMI 18.30 kg/m   Physical Exam Vitals and nursing note reviewed.  Constitutional:      Appearance: He is well-developed.  HENT:     Head: Normocephalic and atraumatic.  Eyes:     Pupils: Pupils are equal, round, and reactive to light.  Neck:     Vascular: No JVD.  Cardiovascular:     Rate and Rhythm: Normal rate and regular rhythm.     Heart sounds: No murmur. No friction rub. No gallop.   Pulmonary:     Effort: No respiratory distress.     Breath sounds: No wheezing.  Abdominal:     General: There is no distension.     Tenderness: There is no guarding or rebound.  Musculoskeletal:        General: Normal range of motion.     Cervical back: Normal range of motion and neck supple.     Comments: No midline c spine tenderness.  Pain along the L trapezius muscle belly.  PMS intact distally.  Rotator cuff without weakness.   Skin:     Coloration: Skin is not pale.     Findings: No rash.  Neurological:     Mental Status: He is alert and oriented to person, place, and time.  Psychiatric:        Behavior: Behavior normal.     ED Results / Procedures / Treatments   Labs (all labs ordered are listed, but only abnormal results are displayed) Labs Reviewed - No data to display  EKG None  Radiology DG Shoulder Left  Result Date: 07/08/2019 CLINICAL DATA:  Left shoulder pain EXAM: LEFT SHOULDER - 2+ VIEW COMPARISON:  10/11/2010 FINDINGS: Acromioclavicular degenerative spurring. No noted glenohumeral spurring. Osteopenic appearance. No acute fracture or dislocation. IMPRESSION: 1. No acute finding. 2. Acromioclavicular osteoarthritis. Electronically Signed   By: Monte Fantasia M.D.   On: 07/08/2019 06:19    Procedures Procedures (including critical care time)  Medications Ordered in ED Medications  acetaminophen (TYLENOL) tablet 1,000 mg (has no administration in time range)  oxyCODONE (Oxy IR/ROXICODONE) immediate release tablet 5 mg (has no administration in time range)  ketorolac (TORADOL) injection 15 mg (has no administration in time range)    ED Course  I have reviewed the triage vital signs and the nursing notes.  Pertinent labs & imaging results that were available during my care of the patient were reviewed by me and considered in my medical decision making (see chart for details).    MDM Rules/Calculators/A&P                      54 yo M with L shoulder pain.  Likely trapezius strain.  Xray of shoulder viewed by me without fx.  Place in sling.  Tylenol and NSAIDs.  PCP follow up.  8:57 AM:  I have discussed the diagnosis/risks/treatment options with the patient and believe the pt to be eligible for discharge home to follow-up with PCP. We also discussed returning to the ED immediately if new or worsening sx occur. We discussed the sx which are most concerning (e.g., sudden worsening pain, fever,  inability to tolerate by mouth) that necessitate immediate return. Medications administered to the patient during their visit and any new prescriptions provided to the patient are listed below.  Medications given during this visit Medications  acetaminophen (TYLENOL) tablet 1,000 mg (has no administration in time range)  oxyCODONE (Oxy IR/ROXICODONE) immediate release tablet 5 mg (has no administration in time range)  ketorolac (TORADOL) injection 15 mg (has no administration in time range)     The patient appears reasonably screen and/or stabilized for discharge and I doubt any other medical condition or other [facility] requiring further screening, evaluation, or treatment in the ED at this time prior to discharge.   Final Clinical Impression(s) / ED Diagnoses Final diagnoses:  Trapezius strain, left, initial encounter  Torticollis    Rx / DC Orders ED Discharge Orders    None       Deno Etienne, DO 07/08/19 [phone]

## 2019-07-08 NOTE — ED Triage Notes (Addendum)
Pt in from home via [facility] with L scapular and L neck pain x 2 days. He states he was fixing his mower on Saturday, and putting the belt on the pulley, when it kicked him back some, jolting him. Pain actually started that night at rest. Tender on palpation. Denies any cp or sob

## 2019-07-08 NOTE — ED Notes (Signed)
ED Provider at bedside. 

## 2019-08-22 ENCOUNTER — Emergency Department (HOSPITAL_COMMUNITY)
Admission: EM | Admit: 2019-08-22 | Discharge: 2019-08-22 | Disposition: A | Discharge status: Home / Self Care | Payer: Self-pay | Attending: Emergency Medicine | Admitting: Emergency Medicine

## 2019-08-22 ENCOUNTER — Emergency Department (HOSPITAL_COMMUNITY): Payer: Self-pay

## 2019-08-22 ENCOUNTER — Other Ambulatory Visit: Payer: Self-pay

## 2019-08-22 DIAGNOSIS — F17210 Nicotine dependence, cigarettes, uncomplicated: Secondary | ICD-10-CM | POA: Insufficient documentation

## 2019-08-22 DIAGNOSIS — R569 Unspecified convulsions: Secondary | ICD-10-CM | POA: Insufficient documentation

## 2019-08-22 DIAGNOSIS — X58XXXA Exposure to other specified factors, initial encounter: Secondary | ICD-10-CM | POA: Insufficient documentation

## 2019-08-22 DIAGNOSIS — Y929 Unspecified place or not applicable: Secondary | ICD-10-CM | POA: Insufficient documentation

## 2019-08-22 DIAGNOSIS — I10 Essential (primary) hypertension: Secondary | ICD-10-CM | POA: Insufficient documentation

## 2019-08-22 DIAGNOSIS — Y999 Unspecified external cause status: Secondary | ICD-10-CM | POA: Insufficient documentation

## 2019-08-22 DIAGNOSIS — Z79899 Other long term (current) drug therapy: Secondary | ICD-10-CM | POA: Insufficient documentation

## 2019-08-22 DIAGNOSIS — Y939 Activity, unspecified: Secondary | ICD-10-CM | POA: Insufficient documentation

## 2019-08-22 DIAGNOSIS — S0101XA Laceration without foreign body of scalp, initial encounter: Secondary | ICD-10-CM | POA: Insufficient documentation

## 2019-08-22 LAB — BASIC METABOLIC PANEL
Anion gap: 15 (ref 5–15)
BUN: 14 mg/dL (ref 6–20)
CO2: 26 mmol/L (ref 22–32)
Calcium: 8.8 mg/dL — ABNORMAL LOW (ref 8.9–10.3)
Chloride: 92 mmol/L — ABNORMAL LOW (ref 98–111)
Creatinine, Ser: 1.11 mg/dL (ref 0.61–1.24)
GFR calc Af Amer: >60 mL/min (ref >60)
GFR calc non Af Amer: >60 mL/min (ref >60)
Glucose, Bld: 140 mg/dL — ABNORMAL HIGH (ref 70–99)
Potassium: 3.3 mmol/L — ABNORMAL LOW (ref 3.5–5.1)
Sodium: 133 mmol/L — ABNORMAL LOW (ref 135–145)

## 2019-08-22 LAB — CBC WITH DIFFERENTIAL/PLATELET
Abs Immature Granulocytes: 0.03 10*3/uL (ref 0.00–0.07)
Basophils Absolute: 0 10*3/uL (ref 0.0–0.1)
Basophils Relative: 1 %
Eosinophils Absolute: 0 10*3/uL (ref 0.0–0.5)
Eosinophils Relative: 1 %
HCT: 34.8 % — ABNORMAL LOW (ref 39.0–52.0)
Hemoglobin: 12 g/dL — ABNORMAL LOW (ref 13.0–17.0)
Immature Granulocytes: 1 %
Lymphocytes Relative: 13 %
Lymphs Abs: 0.8 10*3/uL (ref 0.7–4.0)
MCH: 33.3 pg (ref 26.0–34.0)
MCHC: 34.5 g/dL (ref 30.0–36.0)
MCV: 96.7 fL (ref 80.0–100.0)
Monocytes Absolute: 0.5 10*3/uL (ref 0.1–1.0)
Monocytes Relative: 8 %
Neutro Abs: 4.4 10*3/uL (ref 1.7–7.7)
Neutrophils Relative %: 76 %
Platelets: 219 10*3/uL (ref 150–400)
RBC: 3.6 MIL/uL — ABNORMAL LOW (ref 4.22–5.81)
RDW: 14 % (ref 11.5–15.5)
WBC: 5.8 10*3/uL (ref 4.0–10.5)
nRBC: 0 % (ref 0.0–0.2)

## 2019-08-22 LAB — PHENYTOIN LEVEL, TOTAL: Phenytoin Lvl: <2.5 ug/mL — ABNORMAL LOW (ref 10.0–20.0)

## 2019-08-22 LAB — ETHANOL: Alcohol, Ethyl (B): <10 mg/dL (ref <10)

## 2019-08-22 MED ORDER — LABETALOL HCL 5 MG/ML IV SOLN
10.0000 mg | Freq: Once | INTRAVENOUS | Status: AC
  Administered 2019-08-22: 10 mg via INTRAVENOUS
  Filled 2019-08-22: qty 4

## 2019-08-22 MED ORDER — SODIUM CHLORIDE 0.9 % IV SOLN
1000.0000 mg | Freq: Once | INTRAVENOUS | Status: AC
  Administered 2019-08-22: 1000 mg via INTRAVENOUS
  Filled 2019-08-22: qty 20

## 2019-08-22 MED ORDER — LORAZEPAM 2 MG/ML IJ SOLN
1.0000 mg | Freq: Once | INTRAMUSCULAR | Status: AC
  Administered 2019-08-22: 1 mg via INTRAVENOUS
  Filled 2019-08-22: qty 1

## 2019-08-22 NOTE — Discharge Instructions (Addendum)
You were seen today for seizures.  You were loaded with Dilantin.  Continue Dilantin at home and follow-up closely with your primary physician for adjustment in medications.  You will need staple removal in 7 to 10 days.  CT scan of your head did not show any traumatic injury.  However, you have some lucencies that may require further work-up for possible multiple myeloma or metastatic disease..  Follow-up closely with your primary physician for referral to appropriate providers for work-up.  You will be given a copy of your CT scan.

## 2019-08-22 NOTE — ED Provider Notes (Signed)
[facility] EMERGENCY DEPARTMENT Provider Note   CSN: [id number] Arrival date & time: 08/22/19  0159     History No chief complaint on file.   Joseph Underwood is a 54 y.o. male.  HPI     This is a 54 year old male with history of hypertension, seizures, alcohol abuse, alcohol-related seizures who presents with seizure.  Per report, patient had a seizure witnessed by his mother at home.  Reported approximately 10-minute seizure.  He reportedly was confused for his mother but upon her EMS arrival was awake, alert, and oriented.  He reports that he takes Dilantin for his seizures and has been compliant with his medications.  He also takes medications for his blood pressure and reports compliance but he cannot tell me what they are.  He fell and hit the back of his head during seizure and was noted to have a small laceration to the back of his head.  He is only currently reporting a headache.  Denies any recent illnesses or injuries.  Patient reports that he is not drinking as much alcohol as he used to.  States he last drank alcohol greater than 36 hours ago and he is not drinking daily. Past Medical History:  Diagnosis Date  . Alcohol abuse   . Alcohol related seizure ([place])   . Allergy   . Hiccups   . HTN (hypertension)   . Hyperlipidemia   . Seizures ([place]) 03/10/2014    Patient Active Problem List   Diagnosis Date Noted  . Epilepsy ([place]) 01/24/2019  . Alcohol withdrawal ([place]) 01/10/2018  . Erectile dysfunction 04/13/2016  . Preventative health care 04/12/2016  . 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 Tachycardia 03/28/2014  . Liver cirrhosis ([place]) 03/22/2014  . Hypertriglyceridemia 04/24/2013  . Tobacco use 04/24/2013  . Essential hypertension, benign 01/01/2013  . Alcohol use disorder, moderate, dependence ([place]) 04/13/2006  . Alcohol withdrawal seizure ([place]) 04/13/2006    Past Surgical History:  Procedure Laterality Date  . NO PAST SURGERIES          Family History  Problem Relation Age of Onset  . Hypertension Mother   . Colon cancer Neg Hx   . Esophageal cancer Neg Hx   . Rectal cancer Neg Hx   . Stomach cancer Neg Hx     Social History   Tobacco Use  . Smoking status: Current Every Day Smoker    Packs/day: 0.30    Years: 37.00    Pack years: 11.10    Types: Cigarettes  . Smokeless tobacco: Never Used  . Tobacco comment: 4 -5 cigs per day  Substance Use Topics  . Alcohol use: Yes    Alcohol/week: 88.0 standard drinks    Types: 14 Cans of beer, 74 Shots of liquor per week    Comment: Beer 40oz 4 days a week  . Drug use: No    Home Medications Prior to Admission medications   Medication Sig Start Date End Date Taking? Authorizing Provider  levETIRAcetam (KEPPRA) 500 MG tablet Take 1 tablet (500 mg total) by mouth 2 (two) times daily. 06/22/19  Yes Palumbo, April, MD  phenytoin (DILANTIN) 300 MG ER capsule Take 1 capsule (300 mg total) by mouth daily. 01/23/19  Yes Helberg, Larkin Ina, MD  atorvastatin (LIPITOR) 20 MG tablet Take 1 tablet (20 mg total) by mouth daily. IM program. Patient not taking: Reported on 06/22/2019 01/23/19   Ina Homes, MD  diltiazem (CARDIZEM CD) 120 MG 24 hr capsule Take 1 capsule (  120 mg total) by mouth daily. Patient not taking: Reported on 06/22/2019 01/23/19 01/23/20  Ina Homes, MD  fenofibrate (TRICOR) 145 MG tablet Take 1 tablet (145 mg total) by mouth daily. IM program Patient not taking: Reported on 08/22/2019 01/23/19   Ina Homes, MD  levETIRAcetam (KEPPRA) 500 MG tablet Take 1 tablet (500 mg total) by mouth 2 (two) times daily. Patient not taking: Reported on 06/22/2019 01/23/19   Ina Homes, MD  naltrexone (DEPADE) 50 MG tablet Take 1 tablet (50 mg total) by mouth daily. IM Program. Patient not taking: Reported on 08/22/2019 01/23/19   Ina Homes, MD  omeprazole (PRILOSEC) 20 MG capsule Take 1 capsule (20 mg total) by mouth daily for 14 days. Patient not taking:  Reported on 08/22/2019 01/23/19 08/21/28  Ina Homes, MD    Allergies    Patient has no known allergies.  Review of Systems   Review of Systems  Constitutional: Negative for fever.  Respiratory: Negative for shortness of breath.   Cardiovascular: Negative for chest pain.  Gastrointestinal: Negative for abdominal pain and nausea.  Skin: Positive for wound.  Neurological: Positive for seizures and headaches.  All other systems reviewed and are negative.   Physical Exam Updated Vital Signs BP (!) 115/95 (BP Location: Right Arm)   Pulse 94   Temp 98.2 F (36.8 C) (Oral)   Resp 15   SpO2 98%   Physical Exam Vitals and nursing note reviewed.  Constitutional:      Appearance: He is well-developed. He is not ill-appearing.  HENT:     Head: Normocephalic.     Comments: Small lac posterior scalp with slight bleeding noted    Nose: Nose normal.  Eyes:     Pupils: Pupils are equal, round, and reactive to light.  Cardiovascular:     Rate and Rhythm: Regular rhythm. Tachycardia present.     Heart sounds: Normal heart sounds. No murmur heard.   Pulmonary:     Effort: Pulmonary effort is normal. No respiratory distress.     Breath sounds: Normal breath sounds. No wheezing.  Abdominal:     General: Bowel sounds are normal.     Palpations: Abdomen is soft.     Tenderness: There is no abdominal tenderness. There is no rebound.  Musculoskeletal:     Cervical back: Neck supple.     Right lower leg: No edema.     Left lower leg: No edema.  Lymphadenopathy:     Cervical: No cervical adenopathy.  Skin:    General: Skin is warm and dry.  Neurological:     Mental Status: He is alert and oriented to person, place, and time.     Comments: Cranial nerves II through XII intact, 5 out of 5 strength in all 4 extremities, no dysmetria to finger-nose-finger  Psychiatric:        Mood and Affect: Mood normal.     ED Results / Procedures / Treatments   Labs (all labs ordered are listed,  but only abnormal results are displayed) Labs Reviewed  PHENYTOIN LEVEL, TOTAL - Abnormal; Notable for the following components:      Result Value   Phenytoin Lvl <2.5 (*)    All other components within normal limits  CBC WITH DIFFERENTIAL/PLATELET - Abnormal; Notable for the following components:   RBC 3.60 (*)    Hemoglobin 12.0 (*)    HCT 34.8 (*)    All other components within normal limits  BASIC METABOLIC PANEL - Abnormal; Notable for the  following components:   Sodium 133 (*)    Potassium 3.3 (*)    Chloride 92 (*)    Glucose, Bld 140 (*)    Calcium 8.8 (*)    All other components within normal limits  ETHANOL    EKG EKG Interpretation  Date/Time:  Thursday August 22 2019 02:09:36 EDT Ventricular Rate:  115 PR Interval:    QRS Duration: 84 QT Interval:  322 QTC Calculation: 446 R Axis:   99 Text Interpretation: Sinus tachycardia Atrial premature complex Biatrial enlargement Anterior infarct, old Confirmed by Thayer Jew [phone]) on 08/22/2019 3:28:28 AM   Radiology CT Head Wo Contrast  Result Date: 08/22/2019 CLINICAL DATA:  Seizure with fall and posterior head strike with laceration EXAM: CT HEAD WITHOUT CONTRAST TECHNIQUE: Contiguous axial images were obtained from the base of the skull through the vertex without intravenous contrast. COMPARISON:  CT 01/06/2019 FINDINGS: Brain: No evidence of acute infarction, hemorrhage, hydrocephalus, extra-axial collection or mass lesion/mass effect. Symmetric prominence of the ventricles, cisterns and sulci compatible with parenchymal volume loss and ex vacuo dilatation of the ventricles which is quite advanced with the patient age but similar to prior. Patchy areas of white matter hypoattenuation are most compatible with chronic microvascular angiopathy. Vascular: Atherosclerotic calcification of the carotid siphons. No hyperdense vessel. Skull: High left parietal scalp thickening and laceration without subjacent calvarial fracture  or other acute osseous injury. Multiple lucent lesions present within the calvaria, nonspecific. Sinuses/Orbits: Minimal thickening in the ethmoids and maxillary sinuses. No air-fluid levels or pneumatized secretions. Remaining paranasal sinuses and mastoid air cells are predominantly clear. Rightward nasal septal deviation, nonspecific. Other: None IMPRESSION: 1. High left parietal scalp thickening and laceration without subjacent calvarial fracture or other acute osseous injury. 2. No acute intracranial abnormality. 3. Age advanced parenchymal volume loss and chronic microvascular angiopathy. 4. Multiple lucent lesions within the calvaria, nonspecific but could be seen in the setting of multiple myeloma or metastatic disease. Electronically Signed   By: Lovena Le M.D.   On: 08/22/2019 03:28    Procedures .Marland KitchenLaceration Repair  Date/Time: 08/22/2019 5:20 AM Performed by: Merryl Hacker, MD Authorized by: Merryl Hacker, MD   Consent:    Consent obtained:  Verbal   Consent given by:  Patient   Risks discussed:  Pain   Alternatives discussed:  No treatment Anesthesia (see MAR for exact dosages):    Anesthesia method:  None Laceration details:    Location:  Scalp   Scalp location:  Crown   Length (cm):  3   Depth (mm):  5 Repair type:    Repair type:  Simple Exploration:    Wound exploration: wound explored through full range of motion     Contaminated: no   Treatment:    Area cleansed with:  Saline   Amount of cleaning:  Standard   Irrigation solution:  Sterile saline   Irrigation method:  Syringe   Visualized foreign bodies/material removed: no   Skin repair:    Repair method:  Staples   Number of staples:  2 Approximation:    Approximation:  Close Post-procedure details:    Dressing:  Open (no dressing)   Patient tolerance of procedure:  Tolerated well, no immediate complications   (including critical care time)  Medications Ordered in ED Medications  labetalol  (NORMODYNE) injection 10 mg (10 mg Intravenous Given 08/22/19 0224)  LORazepam (ATIVAN) injection 1 mg (1 mg Intravenous Given 08/22/19 0225)  phenytoin (DILANTIN) 1,000 mg in sodium chloride 0.9 %  250 mL IVPB (0 mg Intravenous Stopped 08/22/19 9038)    ED Course  I have reviewed the triage vital signs and the nursing notes.  Pertinent labs & imaging results that were available during my care of the patient were reviewed by me and considered in my medical decision making (see chart for details).    MDM Rules/Calculators/A&P                          Patient presents with seizure.  History of known seizure disorder as well as history of alcohol abuse.  He is currently awake, alert, oriented.  Mildly tachycardic.  He has a laceration to the posterior scalp.  Neurologic exam is reassuring.  He reports compliance with his antiepileptic.  We will get levels and basic lab work to evaluate for metabolic derangements.  CT head obtained to rule out acute traumatic injury.  CIWA score was 4.  Dilantin level subtherapeutic.  Patient was loaded with Dilantin as well as 1 mg of Ativan.  Blood alcohol level is negative.  CT scan does not show acute traumatic injury but does show some lucencies which could be incidental versus multiple myeloma versus metastatic disease.  No known history.  I discussed this with the patient.  He has remained awake, alert, and nontoxic-appearing.  No further seizure activity.  Feel this can be worked up as an outpatient.  He has been seen in the internal medicine resident clinic.  I did speak with the upper level resident regarding follow-up in clinic for his CT findings.  Dr. Myrtie Hawk will ensure follow-up in clinic within 1 week.  I encourage patient to continue to abstain from alcohol and take his medications as prescribed.  Patient stated understanding.  After history, exam, and medical workup I feel the patient has been appropriately medically screened and is safe for discharge home.  Pertinent diagnoses were discussed with the patient. Patient was given return precautions.   Final Clinical Impression(s) / ED Diagnoses Final diagnoses:  Seizure ([place])  Laceration of scalp, initial encounter    Rx / DC Orders ED Discharge Orders    None       Niamh Rada, Barbette Hair, MD 08/22/19 [phone]

## 2019-08-22 NOTE — ED Triage Notes (Signed)
Pt had witnessed tonic-clonic seizure about an hour ago, hit head on countertop/cabinets. Seizure lasted about 16min, pt was A&O by the time EMS arrived 10-24min after seizure.  Last EMS vitals 0150 190/110 120 95% 115 CBG  18g L FA

## 2019-08-22 NOTE — ED Notes (Signed)
Patient verbalizes understanding of discharge instructions. Opportunity for questioning and answers were provided. Armband removed by staff, pt discharged from ED stable & ambulatory  

## 2019-08-28 ENCOUNTER — Emergency Department (HOSPITAL_COMMUNITY)
Admission: EM | Admit: 2019-08-28 | Discharge: 2019-08-28 | Disposition: A | Discharge status: Home / Self Care | Payer: Self-pay | Attending: Emergency Medicine | Admitting: Emergency Medicine

## 2019-08-28 DIAGNOSIS — F17210 Nicotine dependence, cigarettes, uncomplicated: Secondary | ICD-10-CM | POA: Insufficient documentation

## 2019-08-28 DIAGNOSIS — S0101XD Laceration without foreign body of scalp, subsequent encounter: Secondary | ICD-10-CM | POA: Insufficient documentation

## 2019-08-28 DIAGNOSIS — X58XXXD Exposure to other specified factors, subsequent encounter: Secondary | ICD-10-CM | POA: Insufficient documentation

## 2019-08-28 DIAGNOSIS — Z79899 Other long term (current) drug therapy: Secondary | ICD-10-CM | POA: Insufficient documentation

## 2019-08-28 DIAGNOSIS — Z760 Encounter for issue of repeat prescription: Secondary | ICD-10-CM

## 2019-08-28 DIAGNOSIS — Z4802 Encounter for removal of sutures: Secondary | ICD-10-CM

## 2019-08-28 DIAGNOSIS — I10 Essential (primary) hypertension: Secondary | ICD-10-CM | POA: Insufficient documentation

## 2019-08-28 DIAGNOSIS — R569 Unspecified convulsions: Secondary | ICD-10-CM

## 2019-08-28 MED ORDER — LEVETIRACETAM 500 MG PO TABS: 500.0000 mg | ORAL_TABLET | Freq: Two times a day (BID) | ORAL | 0 refills | Status: AC

## 2019-08-28 MED ORDER — PHENYTOIN SODIUM EXTENDED 300 MG PO CAPS: 300.0000 mg | ORAL_CAPSULE | Freq: Every day | ORAL | 0 refills | Status: AC

## 2019-08-28 MED FILL — PHENYTOIN SOD EXT 300 MG CA: 300 | 30 days supply | Qty: 30 | Fill #0

## 2019-08-28 NOTE — ED Notes (Signed)
Patient given discharge instructions. Questions were answered. Patient verbalized understanding of discharge instructions and care at home.  

## 2019-08-28 NOTE — ED Triage Notes (Signed)
Pt states he had staples placed to posterior scalp on 7/8, here for removal today.

## 2019-08-28 NOTE — ED Provider Notes (Signed)
[facility] EMERGENCY DEPARTMENT Provider Note   CSN: [id number] Arrival date & time: 08/28/19  1412     History Chief Complaint  Patient presents with  . Suture / Staple Removal    Joseph Underwood is a 54 y.o. male with history of alcohol abuse, seizures who presents for staple removal.  Patient was seen in the ED on July 8 for a seizure and head injury.  2 staples were placed over the crown of the scalp.  He states that the wound has been healing well and he has no complaints.  Patient is requesting a refill of his seizure medicines stating that he is out.  He typically goes to the internal medicine clinic but not has not been in some time.  HPI     Past Medical History:  Diagnosis Date  . Alcohol abuse   . Alcohol related seizure ([place])   . Allergy   . Hiccups   . HTN (hypertension)   . Hyperlipidemia   . Seizures ([place]) 03/10/2014    Patient Active Problem List   Diagnosis Date Noted  . Epilepsy ([place]) 01/24/2019  . Alcohol withdrawal ([place]) 01/10/2018  . Erectile dysfunction 04/13/2016  . Preventative health care 04/12/2016  . 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 Tachycardia 03/28/2014  . Liver cirrhosis ([place]) 03/22/2014  . Hypertriglyceridemia 04/24/2013  . Tobacco use 04/24/2013  . Essential hypertension, benign 01/01/2013  . Alcohol use disorder, moderate, dependence ([place]) 04/13/2006  . Alcohol withdrawal seizure ([place]) 04/13/2006    Past Surgical History:  Procedure Laterality Date  . NO PAST SURGERIES         Family History  Problem Relation Age of Onset  . Hypertension Mother   . Colon cancer Neg Hx   . Esophageal cancer Neg Hx   . Rectal cancer Neg Hx   . Stomach cancer Neg Hx     Social History   Tobacco Use  . Smoking status: Current Every Day Smoker    Packs/day: 0.30    Years: 37.00    Pack years: 11.10    Types: Cigarettes  . Smokeless tobacco: Never Used  . Tobacco comment: 4 -5 cigs per day  Substance  Use Topics  . Alcohol use: Yes    Alcohol/week: 88.0 standard drinks    Types: 14 Cans of beer, 74 Shots of liquor per week    Comment: Beer 40oz 4 days a week  . Drug use: No    Home Medications Prior to Admission medications   Medication Sig Start Date End Date Taking? Authorizing Provider  atorvastatin (LIPITOR) 20 MG tablet Take 1 tablet (20 mg total) by mouth daily. IM program. Patient not taking: Reported on 06/22/2019 01/23/19   Ina Homes, MD  diltiazem (CARDIZEM CD) 120 MG 24 hr capsule Take 1 capsule (120 mg total) by mouth daily. Patient not taking: Reported on 06/22/2019 01/23/19 01/23/20  Ina Homes, MD  fenofibrate (TRICOR) 145 MG tablet Take 1 tablet (145 mg total) by mouth daily. IM program Patient not taking: Reported on 08/22/2019 01/23/19   Ina Homes, MD  levETIRAcetam (KEPPRA) 500 MG tablet Take 1 tablet (500 mg total) by mouth 2 (two) times daily. Patient not taking: Reported on 06/22/2019 01/23/19   Ina Homes, MD  levETIRAcetam (KEPPRA) 500 MG tablet Take 1 tablet (500 mg total) by mouth 2 (two) times daily. 06/22/19   Palumbo, April, MD  naltrexone (DEPADE) 50 MG tablet Take 1 tablet (50 mg total) by mouth daily. IM  Program. Patient not taking: Reported on 08/22/2019 01/23/19   Ina Homes, MD  omeprazole (PRILOSEC) 20 MG capsule Take 1 capsule (20 mg total) by mouth daily for 14 days. Patient not taking: Reported on 08/22/2019 01/23/19 08/21/28  Ina Homes, MD  phenytoin (DILANTIN) 300 MG ER capsule Take 1 capsule (300 mg total) by mouth daily. 01/23/19   Ina Homes, MD    Allergies    Patient has no known allergies.  Review of Systems   Review of Systems  Skin: Positive for wound.  Neurological: Negative for weakness.    Physical Exam Updated Vital Signs BP 136/83 (BP Location: Left Arm)   Pulse (!) 101   Temp 98.4 F (36.9 C) (Oral)   Resp 15   SpO2 99%   Physical Exam Vitals and nursing note reviewed.  Constitutional:       General: He is not in acute distress.    Appearance: Normal appearance. He is well-developed. He is not ill-appearing.  HENT:     Head: Normocephalic and atraumatic.     Comments: 2 staples in the crown of the head. Wound is healing well Eyes:     General: No scleral icterus.       Right eye: No discharge.        Left eye: No discharge.     Conjunctiva/sclera: Conjunctivae normal.     Pupils: Pupils are equal, round, and reactive to light.  Cardiovascular:     Rate and Rhythm: Normal rate.  Pulmonary:     Effort: Pulmonary effort is normal. No respiratory distress.  Abdominal:     General: There is no distension.  Musculoskeletal:     Cervical back: Normal range of motion.  Skin:    General: Skin is warm and dry.  Neurological:     Mental Status: He is alert and oriented to person, place, and time.  Psychiatric:        Behavior: Behavior normal.     ED Results / Procedures / Treatments   Labs (all labs ordered are listed, but only abnormal results are displayed) Labs Reviewed - No data to display  EKG None  Radiology No results found.  Procedures .Suture Removal  Date/Time: 08/28/2019 4:19 PM Performed by: Recardo Evangelist, PA-C Authorized by: Recardo Evangelist, PA-C   Consent:    Consent obtained:  Verbal   Consent given by:  Patient   Risks discussed:  Pain   Alternatives discussed:  No treatment Location:    Location:  Head/neck   Head/neck location:  Scalp Procedure details:    Wound appearance:  Good wound healing, no signs of infection and clean   Number of staples removed:  2 Post-procedure details:    Post-removal:  No dressing applied   Patient tolerance of procedure:  Tolerated well, no immediate complications   (including critical care time)  Medications Ordered in ED Medications - No data to display  ED Course  I have reviewed the triage vital signs and the nursing notes.  Pertinent labs & imaging results that were available during my  care of the patient were reviewed by me and considered in my medical decision making (see chart for details).  54 year old male presents for staple removal.  Wound was inspected and appears to be healing well.  2 staples were removed without difficulty.  Patient is also requesting a refill on his seizure medication.  He was given a 30-day supply and advised to follow-up with his primary care for further refills.  MDM Rules/Calculators/A&P                           Final Clinical Impression(s) / ED Diagnoses Final diagnoses:  Encounter for staple removal  Medication refill    Rx / DC Orders ED Discharge Orders    None       Recardo Evangelist, PA-C 08/28/19 1620    Hayden Rasmussen, MD 08/28/19 1925

## 2019-08-28 NOTE — Discharge Instructions (Signed)
Please follow up with Internal Medicine clinic for more refills

## 2020-01-07 ENCOUNTER — Telehealth: Payer: Self-pay | Admitting: *Deleted

## 2020-01-07 ENCOUNTER — Inpatient Hospital Stay (HOSPITAL_COMMUNITY)
Admission: AD | Admit: 2020-01-07 | Discharge: 2020-01-15 | DRG: 432 | Disposition: E | Payer: Medicaid Other | Source: Ambulatory Visit | Attending: Pulmonary Disease | Admitting: Pulmonary Disease

## 2020-01-07 ENCOUNTER — Ambulatory Visit (INDEPENDENT_AMBULATORY_CARE_PROVIDER_SITE_OTHER): Payer: Self-pay | Admitting: Internal Medicine

## 2020-01-07 ENCOUNTER — Encounter: Payer: Self-pay | Admitting: Internal Medicine

## 2020-01-07 VITALS — BP 127/93 | HR 118 | Temp 98.8°F | Wt 107.2 lb

## 2020-01-07 DIAGNOSIS — Z4659 Encounter for fitting and adjustment of other gastrointestinal appliance and device: Secondary | ICD-10-CM

## 2020-01-07 DIAGNOSIS — I110 Hypertensive heart disease with heart failure: Secondary | ICD-10-CM | POA: Diagnosis present

## 2020-01-07 DIAGNOSIS — G40909 Epilepsy, unspecified, not intractable, without status epilepticus: Secondary | ICD-10-CM | POA: Diagnosis present

## 2020-01-07 DIAGNOSIS — F17210 Nicotine dependence, cigarettes, uncomplicated: Secondary | ICD-10-CM | POA: Diagnosis present

## 2020-01-07 DIAGNOSIS — E785 Hyperlipidemia, unspecified: Secondary | ICD-10-CM | POA: Diagnosis present

## 2020-01-07 DIAGNOSIS — E162 Hypoglycemia, unspecified: Secondary | ICD-10-CM | POA: Diagnosis not present

## 2020-01-07 DIAGNOSIS — M7989 Other specified soft tissue disorders: Secondary | ICD-10-CM | POA: Insufficient documentation

## 2020-01-07 DIAGNOSIS — E871 Hypo-osmolality and hyponatremia: Secondary | ICD-10-CM | POA: Diagnosis present

## 2020-01-07 DIAGNOSIS — Z6820 Body mass index (BMI) 20.0-20.9, adult: Secondary | ICD-10-CM

## 2020-01-07 DIAGNOSIS — K8010 Calculus of gallbladder with chronic cholecystitis without obstruction: Secondary | ICD-10-CM | POA: Diagnosis present

## 2020-01-07 DIAGNOSIS — Z66 Do not resuscitate: Secondary | ICD-10-CM

## 2020-01-07 DIAGNOSIS — I083 Combined rheumatic disorders of mitral, aortic and tricuspid valves: Secondary | ICD-10-CM | POA: Diagnosis present

## 2020-01-07 DIAGNOSIS — D696 Thrombocytopenia, unspecified: Secondary | ICD-10-CM | POA: Diagnosis present

## 2020-01-07 DIAGNOSIS — J9690 Respiratory failure, unspecified, unspecified whether with hypoxia or hypercapnia: Secondary | ICD-10-CM | POA: Diagnosis present

## 2020-01-07 DIAGNOSIS — E440 Moderate protein-calorie malnutrition: Secondary | ICD-10-CM | POA: Diagnosis present

## 2020-01-07 DIAGNOSIS — Z9911 Dependence on respirator [ventilator] status: Secondary | ICD-10-CM

## 2020-01-07 DIAGNOSIS — I313 Pericardial effusion (noninflammatory): Secondary | ICD-10-CM | POA: Diagnosis present

## 2020-01-07 DIAGNOSIS — K760 Fatty (change of) liver, not elsewhere classified: Secondary | ICD-10-CM | POA: Diagnosis present

## 2020-01-07 DIAGNOSIS — I469 Cardiac arrest, cause unspecified: Secondary | ICD-10-CM | POA: Diagnosis not present

## 2020-01-07 DIAGNOSIS — Z452 Encounter for adjustment and management of vascular access device: Secondary | ICD-10-CM

## 2020-01-07 DIAGNOSIS — R579 Shock, unspecified: Secondary | ICD-10-CM | POA: Diagnosis not present

## 2020-01-07 DIAGNOSIS — R64 Cachexia: Secondary | ICD-10-CM | POA: Diagnosis present

## 2020-01-07 DIAGNOSIS — K7030 Alcoholic cirrhosis of liver without ascites: Secondary | ICD-10-CM

## 2020-01-07 DIAGNOSIS — I5021 Acute systolic (congestive) heart failure: Secondary | ICD-10-CM | POA: Diagnosis present

## 2020-01-07 DIAGNOSIS — I700 Atherosclerosis of aorta: Secondary | ICD-10-CM | POA: Diagnosis present

## 2020-01-07 DIAGNOSIS — I426 Alcoholic cardiomyopathy: Secondary | ICD-10-CM | POA: Diagnosis present

## 2020-01-07 DIAGNOSIS — K7290 Hepatic failure, unspecified without coma: Secondary | ICD-10-CM | POA: Diagnosis present

## 2020-01-07 DIAGNOSIS — Y903 Blood alcohol level of 60-79 mg/100 ml: Secondary | ICD-10-CM | POA: Diagnosis present

## 2020-01-07 DIAGNOSIS — K7460 Unspecified cirrhosis of liver: Secondary | ICD-10-CM

## 2020-01-07 DIAGNOSIS — R609 Edema, unspecified: Secondary | ICD-10-CM

## 2020-01-07 DIAGNOSIS — R069 Unspecified abnormalities of breathing: Secondary | ICD-10-CM

## 2020-01-07 DIAGNOSIS — D539 Nutritional anemia, unspecified: Secondary | ICD-10-CM | POA: Diagnosis present

## 2020-01-07 DIAGNOSIS — Z79899 Other long term (current) drug therapy: Secondary | ICD-10-CM

## 2020-01-07 DIAGNOSIS — Z7189 Other specified counseling: Secondary | ICD-10-CM

## 2020-01-07 DIAGNOSIS — I5082 Biventricular heart failure: Secondary | ICD-10-CM | POA: Diagnosis present

## 2020-01-07 DIAGNOSIS — Z515 Encounter for palliative care: Secondary | ICD-10-CM | POA: Diagnosis not present

## 2020-01-07 DIAGNOSIS — K7011 Alcoholic hepatitis with ascites: Secondary | ICD-10-CM | POA: Diagnosis present

## 2020-01-07 DIAGNOSIS — Z20822 Contact with and (suspected) exposure to covid-19: Secondary | ICD-10-CM | POA: Diagnosis present

## 2020-01-07 DIAGNOSIS — F1020 Alcohol dependence, uncomplicated: Secondary | ICD-10-CM | POA: Diagnosis present

## 2020-01-07 DIAGNOSIS — K810 Acute cholecystitis: Secondary | ICD-10-CM

## 2020-01-07 DIAGNOSIS — E8770 Fluid overload, unspecified: Secondary | ICD-10-CM | POA: Diagnosis present

## 2020-01-07 DIAGNOSIS — N281 Cyst of kidney, acquired: Secondary | ICD-10-CM | POA: Diagnosis present

## 2020-01-07 DIAGNOSIS — D638 Anemia in other chronic diseases classified elsewhere: Secondary | ICD-10-CM | POA: Diagnosis present

## 2020-01-07 DIAGNOSIS — Z8249 Family history of ischemic heart disease and other diseases of the circulatory system: Secondary | ICD-10-CM

## 2020-01-07 DIAGNOSIS — K7031 Alcoholic cirrhosis of liver with ascites: Principal | ICD-10-CM | POA: Diagnosis present

## 2020-01-07 DIAGNOSIS — R54 Age-related physical debility: Secondary | ICD-10-CM | POA: Diagnosis present

## 2020-01-07 DIAGNOSIS — R739 Hyperglycemia, unspecified: Secondary | ICD-10-CM | POA: Diagnosis not present

## 2020-01-07 DIAGNOSIS — R079 Chest pain, unspecified: Secondary | ICD-10-CM

## 2020-01-07 DIAGNOSIS — Z86010 Personal history of colonic polyps: Secondary | ICD-10-CM

## 2020-01-07 DIAGNOSIS — R600 Localized edema: Secondary | ICD-10-CM

## 2020-01-07 DIAGNOSIS — N179 Acute kidney failure, unspecified: Secondary | ICD-10-CM | POA: Diagnosis present

## 2020-01-07 HISTORY — DX: Alcoholic hepatitis without ascites: K70.10

## 2020-01-07 HISTORY — DX: Unspecified cirrhosis of liver: K74.60

## 2020-01-07 LAB — CBC WITH DIFFERENTIAL/PLATELET
Abs Immature Granulocytes: 0.1 10*3/uL — ABNORMAL HIGH (ref 0.00–0.07)
Basophils Absolute: 0 10*3/uL (ref 0.0–0.1)
Basophils Relative: 1 %
Eosinophils Absolute: 0 10*3/uL (ref 0.0–0.5)
Eosinophils Relative: 0 %
HCT: 33.6 % — ABNORMAL LOW (ref 39.0–52.0)
Hemoglobin: 11.8 g/dL — ABNORMAL LOW (ref 13.0–17.0)
Immature Granulocytes: 2 %
Lymphocytes Relative: 15 %
Lymphs Abs: 0.9 10*3/uL (ref 0.7–4.0)
MCH: 35.9 pg — ABNORMAL HIGH (ref 26.0–34.0)
MCHC: 35.1 g/dL (ref 30.0–36.0)
MCV: 102.1 fL — ABNORMAL HIGH (ref 80.0–100.0)
Monocytes Absolute: 0.6 10*3/uL (ref 0.1–1.0)
Monocytes Relative: 9 %
Neutro Abs: 4.8 10*3/uL (ref 1.7–7.7)
Neutrophils Relative %: 73 %
Platelets: 165 10*3/uL (ref 150–400)
RBC: 3.29 MIL/uL — ABNORMAL LOW (ref 4.22–5.81)
RDW: 15.8 % — ABNORMAL HIGH (ref 11.5–15.5)
WBC: 6.5 10*3/uL (ref 4.0–10.5)
nRBC: 1.7 % — ABNORMAL HIGH (ref 0.0–0.2)

## 2020-01-07 LAB — COMPREHENSIVE METABOLIC PANEL
ALT: 126 U/L — ABNORMAL HIGH (ref 0–44)
AST: 168 U/L — ABNORMAL HIGH (ref 15–41)
Albumin: 2.7 g/dL — ABNORMAL LOW (ref 3.5–5.0)
Alkaline Phosphatase: 448 U/L — ABNORMAL HIGH (ref 38–126)
Anion gap: 14 (ref 5–15)
BUN: 28 mg/dL — ABNORMAL HIGH (ref 6–20)
CO2: 18 mmol/L — ABNORMAL LOW (ref 22–32)
Calcium: 8.2 mg/dL — ABNORMAL LOW (ref 8.9–10.3)
Chloride: 100 mmol/L (ref 98–111)
Creatinine, Ser: 1.34 mg/dL — ABNORMAL HIGH (ref 0.61–1.24)
GFR, Estimated: >60 mL/min (ref >60)
Glucose, Bld: 75 mg/dL (ref 70–99)
Potassium: 4 mmol/L (ref 3.5–5.1)
Sodium: 132 mmol/L — ABNORMAL LOW (ref 135–145)
Total Bilirubin: 3.7 mg/dL — ABNORMAL HIGH (ref 0.3–1.2)
Total Protein: 5.5 g/dL — ABNORMAL LOW (ref 6.5–8.1)

## 2020-01-07 LAB — SARS CORONAVIRUS 2 BY RT PCR (HOSPITAL ORDER, PERFORMED IN ~~LOC~~ HOSPITAL LAB): SARS Coronavirus 2: NEGATIVE

## 2020-01-07 LAB — HEPATITIS B SURFACE ANTIBODY,QUALITATIVE: Hep B S Ab: NONREACTIVE

## 2020-01-07 LAB — HEPATITIS B CORE ANTIBODY, IGM: Hep B C IgM: NONREACTIVE

## 2020-01-07 LAB — PROTIME-INR
INR: 1.1 (ref 0.8–1.2)
Prothrombin Time: 14 seconds (ref 11.4–15.2)

## 2020-01-07 LAB — HEPATITIS B SURFACE ANTIGEN: Hepatitis B Surface Ag: NONREACTIVE

## 2020-01-07 LAB — HIV ANTIBODY (ROUTINE TESTING W REFLEX): HIV Screen 4th Generation wRfx: NONREACTIVE

## 2020-01-07 LAB — ACETAMINOPHEN LEVEL: Acetaminophen (Tylenol), Serum: <10 ug/mL — ABNORMAL LOW (ref 10–30)

## 2020-01-07 LAB — ETHANOL: Alcohol, Ethyl (B): 66 mg/dL — ABNORMAL HIGH (ref <10)

## 2020-01-07 MED ORDER — THIAMINE HCL 100 MG/ML IJ SOLN
100.0000 mg | Freq: Every day | INTRAMUSCULAR | Status: DC
  Administered 2020-01-08 – 2020-01-09 (×2): 100 mg via INTRAVENOUS
  Filled 2020-01-07 (×2): qty 2

## 2020-01-07 MED ORDER — PHENYTOIN SODIUM EXTENDED 100 MG PO CAPS
300.0000 mg | ORAL_CAPSULE | Freq: Every day | ORAL | Status: DC
  Administered 2020-01-07 – 2020-01-09 (×3): 300 mg via ORAL
  Filled 2020-01-07 (×4): qty 3

## 2020-01-07 MED ORDER — ADULT MULTIVITAMIN W/MINERALS CH
1.0000 | ORAL_TABLET | Freq: Every day | ORAL | Status: DC
  Administered 2020-01-07 – 2020-01-09 (×3): 1 via ORAL
  Filled 2020-01-07 (×3): qty 1

## 2020-01-07 MED ORDER — SPIRONOLACTONE 25 MG PO TABS
100.0000 mg | ORAL_TABLET | Freq: Every day | ORAL | Status: DC
  Administered 2020-01-08 – 2020-01-09 (×2): 100 mg via ORAL
  Filled 2020-01-07 (×2): qty 4

## 2020-01-07 MED ORDER — ATORVASTATIN CALCIUM 10 MG PO TABS
20.0000 mg | ORAL_TABLET | Freq: Every day | ORAL | Status: DC
  Administered 2020-01-08 – 2020-01-09 (×2): 20 mg via ORAL
  Filled 2020-01-07 (×2): qty 2

## 2020-01-07 MED ORDER — LORAZEPAM 1 MG PO TABS
1.0000 mg | ORAL_TABLET | ORAL | Status: DC | PRN
  Administered 2020-01-09: 2 mg via ORAL
  Filled 2020-01-07: qty 2

## 2020-01-07 MED ORDER — DILTIAZEM HCL ER COATED BEADS 120 MG PO CP24
120.0000 mg | ORAL_CAPSULE | Freq: Every day | ORAL | Status: DC
  Administered 2020-01-07 – 2020-01-09 (×3): 120 mg via ORAL
  Filled 2020-01-07 (×3): qty 1

## 2020-01-07 MED ORDER — ENOXAPARIN SODIUM 40 MG/0.4ML ~~LOC~~ SOLN
40.0000 mg | SUBCUTANEOUS | Status: DC
  Administered 2020-01-07: 40 mg via SUBCUTANEOUS
  Filled 2020-01-07 (×2): qty 0.4

## 2020-01-07 MED ORDER — LORAZEPAM 2 MG/ML IJ SOLN: 1.0000 mg | INTRAMUSCULAR | Status: DC | PRN

## 2020-01-07 MED ORDER — PANTOPRAZOLE SODIUM 40 MG PO TBEC
40.0000 mg | DELAYED_RELEASE_TABLET | Freq: Every day | ORAL | Status: DC
  Administered 2020-01-07 – 2020-01-09 (×3): 40 mg via ORAL
  Filled 2020-01-07 (×3): qty 1

## 2020-01-07 MED ORDER — THIAMINE HCL 100 MG PO TABS
100.0000 mg | ORAL_TABLET | Freq: Every day | ORAL | Status: DC
  Administered 2020-01-07: 100 mg via ORAL
  Filled 2020-01-07 (×2): qty 1

## 2020-01-07 MED ORDER — FENOFIBRATE 160 MG PO TABS
160.0000 mg | ORAL_TABLET | Freq: Every day | ORAL | Status: DC
  Administered 2020-01-07 – 2020-01-09 (×3): 160 mg via ORAL
  Filled 2020-01-07 (×4): qty 1

## 2020-01-07 MED ORDER — LEVETIRACETAM 500 MG PO TABS
500.0000 mg | ORAL_TABLET | Freq: Two times a day (BID) | ORAL | Status: DC
  Administered 2020-01-07 – 2020-01-09 (×5): 500 mg via ORAL
  Filled 2020-01-07 (×5): qty 1

## 2020-01-07 MED ORDER — FOLIC ACID 1 MG PO TABS
1.0000 mg | ORAL_TABLET | Freq: Every day | ORAL | Status: DC
  Administered 2020-01-07 – 2020-01-09 (×3): 1 mg via ORAL
  Filled 2020-01-07 (×3): qty 1

## 2020-01-07 NOTE — H&P (Signed)
NAME:  Joseph Underwood, MRN:  [id number], DOB:  1965-07-18, LOS: 0 ADMISSION DATE:  01/06/2020, Primary: [name], MD  CHIEF COMPLAINT:  Lower extremity edema   Medical Service: Internal Medicine Teaching Service         Attending Physician: Dr. Oda Kilts, MD    First Contact: Dr. Darrick Meigs Pager: [phone]  Second Contact: Dr. Wynetta Emery Pager: [phone]       After Hours (After 5p/  First Contact Pager: [phone]  weekends / holidays): Second Contact Pager: [phone]    History of present illness   7 yom with a history of alcohol induced liver cirrhosis who presented to the [facility] clinic today for 2w history of lower extremity edema and dyspnea on exertion.  He notes that his son passed away around 2 weeks ago followed by onset of above symptoms. He endorses a cough productive of clear sputum. Denies orthopnea, palpitations, abdominal pain, n/v, diarrhea, fever or chills. In regards to his dyspnea on exertion, he notes that he is typically able to ambulate from the parking lot down to our clinic without getting short of breath, however did have to stop twice today to catch his breath. He does note that he had several alcoholic drinks on the Saturday prior to symptom onset. He denies any other dietary indiscretions. Denies prior similar symptoms.  He does have alcohol use disorder and had previously been on naltrexone however, today, notes that he has not been taking it. He reports consuming an average of 6-7 alcoholic beverages per week on average.  Past Medical History  He,  has a past medical history of Alcohol abuse, Alcohol related seizure ([place]), Allergy, Hiccups, HTN (hypertension), Hyperlipidemia, and Seizures ([place]) (03/10/2014).   Home Medications     Prior to Admission medications   Medication Sig Start Date End Date Taking? Authorizing Provider  atorvastatin (LIPITOR) 20 MG tablet Take 1 tablet (20 mg total) by mouth daily. IM program. Patient not  taking: Reported on 06/22/2019 01/23/19   Ina Homes, MD  diltiazem (CARDIZEM CD) 120 MG 24 hr capsule Take 1 capsule (120 mg total) by mouth daily. Patient not taking: Reported on 06/22/2019 01/23/19 01/23/20  Ina Homes, MD  fenofibrate (TRICOR) 145 MG tablet Take 1 tablet (145 mg total) by mouth daily. IM program Patient not taking: Reported on 08/22/2019 01/23/19   Ina Homes, MD  levETIRAcetam (KEPPRA) 500 MG tablet Take 1 tablet (500 mg total) by mouth 2 (two) times daily. Patient not taking: Reported on 06/22/2019 01/23/19   Ina Homes, MD  levETIRAcetam (KEPPRA) 500 MG tablet Take 1 tablet (500 mg total) by mouth 2 (two) times daily. 08/28/19   Recardo Evangelist, PA-C  naltrexone (DEPADE) 50 MG tablet Take 1 tablet (50 mg total) by mouth daily. IM Program. Patient not taking: Reported on 08/22/2019 01/23/19   Ina Homes, MD  omeprazole (PRILOSEC) 20 MG capsule Take 1 capsule (20 mg total) by mouth daily for 14 days. Patient not taking: Reported on 08/22/2019 01/23/19 08/21/28  Ina Homes, MD  phenytoin (DILANTIN) 300 MG ER capsule Take 1 capsule (300 mg total) by mouth daily. 08/28/19   Recardo Evangelist, PA-C    Allergies    Allergies as of 01/06/2020  . (No Known Allergies)    Social History   reports that he has been smoking cigarettes. He has a 11.10 pack-year smoking history. He has never used smokeless tobacco. He reports current alcohol use of about 88.0 standard drinks of alcohol  per week. He reports that he does not use drugs.   Family History   His family history includes Hypertension in his mother. There is no history of Colon cancer, Esophageal cancer, Rectal cancer, or Stomach cancer.    ROS  Negative unless stated in HPI  Objective   Blood pressure (!) 122/104, pulse (!) 123, temperature 98 F (36.7 C), temperature source Oral, resp. rate 18, height 5\' 5"  (1.651 m), weight 56.2 kg, SpO2 100 %.    Filed Weights   01/03/2020 1800  Weight: 56.2 kg     Examination: GENERAL: chronically in appearing but in NAD HEENT: eyes anti-icteric CARDIAC: tachycardic rate, regular rhythm. JVD to angle of the mandible. +3 pitting edema extending to the knee. Legs are weeping. PULMONARY: breathing comfortably on room air. Lungs clear bilaterally. ABDOMEN: abdomen distended. Possible slight fluid wave.  NEURO: a/o x3. No asterixis SKIN: lower extremities weeping clear fluid. MSK: moves all extremities.   Significant Diagnostic Tests:  n/a  Labs    CBC Latest Ref Rng & Units 08/22/2019 06/22/2019 06/22/2019  WBC 4.0 - 10.5 K/uL 5.8 - 7.8  Hemoglobin 13.0 - 17.0 g/dL 12.0(L) 12.6(L) 12.4(L)  Hematocrit 39 - 52 % 34.8(L) 37.0(L) 35.1(L)  Platelets 150 - 400 K/uL 219 - 174   BMP Latest Ref Rng & Units 08/22/2019 06/22/2019 01/14/2019  Glucose 70 - 99 mg/dL 140(H) 89 186(H)  BUN 6 - 20 mg/dL 14 21(H) 9  Creatinine 0.61 - 1.24 mg/dL 1.11 1.40(H) 0.90  BUN/Creat Ratio 9 - 20 - - -  Sodium 135 - 145 mmol/L 133(L) 133(L) 135  Potassium 3.5 - 5.1 mmol/L 3.3(L) 3.1(L) 3.3(L)  Chloride 98 - 111 mmol/L 92(L) 93(L) 92(L)  CO2 22 - 32 mmol/L 26 - -  Calcium 8.9 - 10.3 mg/dL 8.8(L) - -     Summary  73 yom with alcoholic liver cirrhosis who was admitted to IMTS for acute onset lower extremity edema, shortness of breath concerning for decompensated liver cirrhosis.  Assessment & Plan:  Active Problems:   Volume overload  Lower extremity edema, shortness of breath concerning for decompensated liver cirrhosis. May have been triggered by alcohol binge as noted in HPI.  Heart failure or cirrhotic cardiomyopathy considered. No prior history of cardiac dysfunction, no prior echo. Nephrotic syndrome considered however less likely given the acute onset and nature. Unlikely to be VTE given it's bilateral nature, unless it is involving the IVC Hyperechogenic liver lesion--1.2cm--as noted on 2019 abd [place] at which time it was noted to be stable in size. Plan -CMP to  evaluate LFTs, protein/albumin levels -CBC to check platelet count -PT/INR for MELD score calculation -ETOH level, acetaminophen level -viral hepatitis panel -echocardiogram to evaluate for cardiogenic source -elastography to evaluate for degree of cirrhosis -if renal function is ok, will diurese with 40mg  IV lasix.  -if K is ok, start spironolactone.  -if LFTs are elevated, will start steroids based on calculated MDF score  Alcohol use disorder. No longer on naltrexone. Current drinker. Hx of DT's -CIWA with ativan -continue keppra and phenytoin -seizure precautions  Chronic hypertension and hyperlipidemia. Continue diltiazem and fenofibrate   Best practice:  CODE STATUS: Full Diet: low sodium Pain management: GI prophylaxis: protonix DVT for prophylaxis: lovenox Dispo: Admit patient to Inpatient with expected length of stay greater than 2 midnights.   Mitzi Hansen, MD Internal Medicine Resident PGY-2 [facility] Pager: [phone] 12/31/2019 6:50 PM

## 2020-01-07 NOTE — Progress Notes (Signed)
   12/30/2019 1826  Assess: MEWS Score  Temp 98 F (36.7 C)  BP (!) 122/104  Pulse Rate (!) 123  Resp 18  Level of Consciousness Alert  SpO2 100 %  O2 Device Room Air  Assess: MEWS Score  MEWS Temp 0  MEWS Systolic 0  MEWS Pulse 2  MEWS RR 0  MEWS LOC 0  MEWS Score 2  MEWS Score Color Yellow  Assess: if the MEWS score is Yellow or Red  Were vital signs taken at a resting state? Yes  Focused Assessment No change from prior assessment  Early Detection of Sepsis Score *See Row Information* Low  MEWS guidelines implemented *See Row Information* Yes  Take Vital Signs  Increase Vital Sign Frequency  Yellow: Q 2hr X 2 then Q 4hr X 2, if remains yellow, continue Q 4hrs  Escalate  MEWS: Escalate Yellow: discuss with charge nurse/RN and consider discussing with provider and RRT  Notify: Charge Nurse/RN  Name of Charge Nurse/RN Notified Alden Server  Date Charge Nurse/RN Notified 01/04/2020  Time Charge Nurse/RN Notified 7543  Notify: Provider  Provider Name/Title Mitzi Hansen  Date Provider Notified 01/14/2020  Time Provider Notified 1855  Notification Type Page  Notification Reason Change in status  Response No new orders  Date of Provider Response 01/02/2020  Time of Provider Response 1855  Document  Patient Outcome Not stable and remains on department  Progress note created (see row info) Yes

## 2020-01-07 NOTE — Assessment & Plan Note (Signed)
Patient presented with LEE and DOE today. (See the note under leg edema). Has questionable melena and is tachycardic (chronic?) and will admit him for further management. May consider liver US, EGD,...

## 2020-01-07 NOTE — Assessment & Plan Note (Addendum)
Patient presented with 2-week history of bilateral lower extremity swelling. He has history of alcohol use disorder and cirrhosis (per prior CT scan report), but no prior history of edema or volume overload and he is not on diuretics. He also endorses having some dyspnea on exertion.  On exam, he has mild bibasilar crackle, 3+ bilateral lower extremity edema with some oozing from his left foot. He has moderate abdominal distention (unchanged per patient).  Hypervolemia likely in setting of cirrhosis. I dont think he does well with outpatient management. Also has DOE and mild crackles on exam and can not rule out pulm edema/effusion. Will talk to inpatient team for admission for diuresis and further work up for cirrhosis, CXR, etc.. Other etiology such as HF, nephrotic syndromr are on differential. Patient does not have any recent echo.  -Talked to Dr. Darrick Meigs for admission

## 2020-01-07 NOTE — Telephone Encounter (Signed)
Call from pt stating his legs and feet has been swollen x 2 weeks. Also having drainage; states the fluid soaks thru his socks and pants . And he has a sore on his right foot and 2 sores on his left foot. Requesting an appt for today; has not had covid vaccines - denies any symptoms except "little sob when walking".   Appt scheduled for today @ 1415 PM with Dr Myrtie Hawk.

## 2020-01-07 NOTE — Progress Notes (Signed)
Acute Office Visit  Subjective:    Patient ID: Joseph Underwood, male    DOB: [birth year], 54 y.o.   MRN: [id number]  Chief Complaint  Patient presents with  . Foot Swelling    c/o bilateral lower pain/swelling x 2 wks    HPI Patient is in today for leg swelling. Please refer to problem based charting for further details and assessment and plan of current problem and chronic medical conditions.  PMHx: HTN, liver cirrhosis, epilepsy, tobacco use, HLD, alcohol use history  Past Medical History:  Diagnosis Date  . Alcohol abuse   . Alcohol related seizure ([place])   . Allergy   . Hiccups   . HTN (hypertension)   . Hyperlipidemia   . Seizures ([place]) 03/10/2014    Past Surgical History:  Procedure Laterality Date  . NO PAST SURGERIES      Family History  Problem Relation Age of Onset  . Hypertension Mother   . Colon cancer Neg Hx   . Esophageal cancer Neg Hx   . Rectal cancer Neg Hx   . Stomach cancer Neg Hx     Social History   Socioeconomic History  . Marital status: Single    Spouse name: Not on file  . Number of children: Not on file  . Years of education: Not on file  . Highest education level: Not on file  Occupational History  . Not on file  Tobacco Use  . Smoking status: Current Every Day Smoker    Packs/day: 0.30    Years: 37.00    Pack years: 11.10    Types: Cigarettes  . Smokeless tobacco: Never Used  . Tobacco comment: 4 -5 cigs per day  Substance and Sexual Activity  . Alcohol use: Yes    Alcohol/week: 88.0 standard drinks    Types: 14 Cans of beer, 74 Shots of liquor per week    Comment: Beer 40oz 4 days a week  . Drug use: No  . Sexual activity: Never  Other Topics Concern  . Not on file  Social History Narrative  . Not on file   Social Determinants of Health   Financial Resource Strain:   . Difficulty of Paying Living Expenses: Not on file  Food Insecurity:   . Worried About Charity fundraiser in the Last Year: Not on file  . Ran Out  of Food in the Last Year: Not on file  Transportation Needs:   . Lack of Transportation (Medical): Not on file  . Lack of Transportation (Non-Medical): Not on file  Physical Activity:   . Days of Exercise per Week: Not on file  . Minutes of Exercise per Session: Not on file  Stress:   . Feeling of Stress : Not on file  Social Connections:   . Frequency of Communication with Friends and Family: Not on file  . Frequency of Social Gatherings with Friends and Family: Not on file  . Attends Religious Services: Not on file  . Active Member of Clubs or Organizations: Not on file  . Attends Archivist Meetings: Not on file  . Marital Status: Not on file  Intimate Partner Violence:   . Fear of Current or Ex-Partner: Not on file  . Emotionally Abused: Not on file  . Physically Abused: Not on file  . Sexually Abused: Not on file    Outpatient Medications Prior to Visit  Medication Sig Dispense Refill  . atorvastatin (LIPITOR) 20 MG tablet Take 1 tablet (20  mg total) by mouth daily. IM program. (Patient not taking: Reported on 06/22/2019) 90 tablet 1  . diltiazem (CARDIZEM CD) 120 MG 24 hr capsule Take 1 capsule (120 mg total) by mouth daily. (Patient not taking: Reported on 06/22/2019) 90 capsule 3  . fenofibrate (TRICOR) 145 MG tablet Take 1 tablet (145 mg total) by mouth daily. IM program (Patient not taking: Reported on 08/22/2019) 90 tablet 1  . levETIRAcetam (KEPPRA) 500 MG tablet Take 1 tablet (500 mg total) by mouth 2 (two) times daily. (Patient not taking: Reported on 06/22/2019) 60 tablet 0  . levETIRAcetam (KEPPRA) 500 MG tablet Take 1 tablet (500 mg total) by mouth 2 (two) times daily. 60 tablet 0  . naltrexone (DEPADE) 50 MG tablet Take 1 tablet (50 mg total) by mouth daily. IM Program. (Patient not taking: Reported on 08/22/2019) 90 tablet 1  . omeprazole (PRILOSEC) 20 MG capsule Take 1 capsule (20 mg total) by mouth daily for 14 days. (Patient not taking: Reported on 08/22/2019) 14  capsule 0  . phenytoin (DILANTIN) 300 MG ER capsule Take 1 capsule (300 mg total) by mouth daily. 30 capsule 0   No facility-administered medications prior to visit.    No Known Allergies  Review of Systems     Objective:  BP (!) 127/93 (BP Location: Right Arm, Patient Position: Sitting, Cuff Size: Small)   Pulse (!) 118   Temp 98.8 F (37.1 C) (Oral)   Wt 107 lb 3.2 oz (48.6 kg)   SpO2 100%   BMI 17.84 kg/m  Wt Readings from Last 3 Encounters:  01/03/2020 107 lb 3.2 oz (48.6 kg)  07/08/19 110 lb (49.9 kg)  04/16/19 107 lb 6.4 oz (48.7 kg)    Physical Exam Constitutional:      General: He is not in acute distress.    Appearance: He is not ill-appearing.  Cardiovascular:     Rate and Rhythm: Tachycardia present.  Pulmonary:     Breath sounds: Rales present. No wheezing.  Abdominal:     General: There is no distension.     Palpations: Abdomen is soft.     Tenderness: There is no abdominal tenderness.  Musculoskeletal:     Right lower leg: Edema present.     Left lower leg: Edema present.  Neurological:     Mental Status: He is alert.      Health Maintenance Due  Topic Date Due  . COVID-19 Vaccine (1) Never done  . COLONOSCOPY  06/15/2019  . INFLUENZA VACCINE  09/15/2019    There are no preventive care reminders to display for this patient.   Lab Results  Component Value Date   TSH 0.640 01/11/2018   Lab Results  Component Value Date   WBC 5.8 08/22/2019   HGB 12.0 (L) 08/22/2019   HCT 34.8 (L) 08/22/2019   MCV 96.7 08/22/2019   PLT 219 08/22/2019   Lab Results  Component Value Date   NA 133 (L) 08/22/2019   K 3.3 (L) 08/22/2019   CO2 26 08/22/2019   GLUCOSE 140 (H) 08/22/2019   BUN 14 08/22/2019   CREATININE 1.11 08/22/2019   BILITOT 0.8 01/06/2019   ALKPHOS 112 01/06/2019   AST 190 (H) 01/06/2019   ALT 47 (H) 01/06/2019   PROT 6.9 01/06/2019   ALBUMIN 3.4 (L) 01/06/2019   CALCIUM 8.8 (L) 08/22/2019   ANIONGAP 15 08/22/2019   Lab  Results  Component Value Date   CHOL 274 (H) 12/29/2015   Lab Results  Component Value Date   HDL 88 12/29/2015   Lab Results  Component Value Date   LDLCALC 151 (H) 12/29/2015   Lab Results  Component Value Date   TRIG 175 (H) 12/29/2015   Lab Results  Component Value Date   CHOLHDL 3.1 12/29/2015   Lab Results  Component Value Date   HGBA1C 5.3 12/08/2014       Assessment & Plan:   Problem List Items Addressed This Visit      Digestive   Liver cirrhosis ([place]) - Primary    Patient presented with LEE and DOE today. (See the note under leg edema). Has questionable melena and is tachycardic (chronic?) and will admit him for further management. May consider liver US, EGD,...      Relevant Orders   IV team consult     Other   Leg swelling    Patient presented with 2-week history of bilateral lower extremity swelling. He has history of alcohol use disorder and cirrhosis (per prior CT scan report), but no prior history of edema or volume overload and he is not on diuretics. He also endorses having some dyspnea on exertion.  On exam, he has mild bibasilar crackle, 3+ bilateral lower extremity edema with some oozing from his left foot. He has moderate abdominal distention (unchanged per patient).  Hypervolemia likely in setting of cirrhosis. I dont think he does well with outpatient management. Also has DOE and mild crackles on exam and can not rule out pulm edema/effusion. Will talk to inpatient team for admission for diuresis and further work up for cirrhosis, CXR, etc.. Other etiology such as HF, nephrotic syndromr are on differential. Patient does not have any recent echo.  -Talked to Dr. Darrick Meigs for admission         Patient discussed with Dr. Dareen Piano. No orders of the defined types were placed in this encounter.    Dewayne Hatch, MD

## 2020-01-07 NOTE — Progress Notes (Signed)
Report called to Iverson Alamin, RN on 5 M patient transported via wheelchair. Patient alert oriented.  @0  Gauge NS lock in left fore arm.  Both lower extremities continue to leak clear fluid.  Sander Nephew, RN 01/12/2020 5:45 PM

## 2020-01-07 NOTE — Progress Notes (Signed)
New Admission Note:   Arrival Method:  Wheelchair Mental Orientation: A & O x 4  Telemetry: none Assessment: Completed Skin: dry and intact, weeping LE IV: left forearm  NSL Pain: 10 in both lower extremities Tubes: none Safety Measures: Safety Fall Prevention Plan has been given, discussed and signed Admission: Completed [facility] Orientation: Patient has been orientated to the room, unit and staff.  Family: none  Patient was a direct admit with no orders.Assesses patient with no pending orders.Placed on airborne isolation since patient was not tested for Covid prior to arriving on the floor.. Will continue to monitor the patient. Call light has been placed within reach and bed alarm has been activated.   Dolores Hoose, RN

## 2020-01-08 ENCOUNTER — Encounter (HOSPITAL_COMMUNITY): Payer: Self-pay | Admitting: Internal Medicine

## 2020-01-08 ENCOUNTER — Inpatient Hospital Stay (HOSPITAL_COMMUNITY): Payer: Medicaid Other

## 2020-01-08 DIAGNOSIS — R601 Generalized edema: Secondary | ICD-10-CM

## 2020-01-08 DIAGNOSIS — K7030 Alcoholic cirrhosis of liver without ascites: Secondary | ICD-10-CM

## 2020-01-08 DIAGNOSIS — D539 Nutritional anemia, unspecified: Secondary | ICD-10-CM

## 2020-01-08 DIAGNOSIS — F1010 Alcohol abuse, uncomplicated: Secondary | ICD-10-CM

## 2020-01-08 DIAGNOSIS — I340 Nonrheumatic mitral (valve) insufficiency: Secondary | ICD-10-CM

## 2020-01-08 DIAGNOSIS — I361 Nonrheumatic tricuspid (valve) insufficiency: Secondary | ICD-10-CM

## 2020-01-08 DIAGNOSIS — K7460 Unspecified cirrhosis of liver: Secondary | ICD-10-CM

## 2020-01-08 DIAGNOSIS — R0600 Dyspnea, unspecified: Secondary | ICD-10-CM

## 2020-01-08 DIAGNOSIS — F1020 Alcohol dependence, uncomplicated: Secondary | ICD-10-CM

## 2020-01-08 DIAGNOSIS — R600 Localized edema: Secondary | ICD-10-CM

## 2020-01-08 DIAGNOSIS — D509 Iron deficiency anemia, unspecified: Secondary | ICD-10-CM

## 2020-01-08 LAB — COMPREHENSIVE METABOLIC PANEL
ALT: 119 U/L — ABNORMAL HIGH (ref 0–44)
AST: 184 U/L — ABNORMAL HIGH (ref 15–41)
Albumin: 2.6 g/dL — ABNORMAL LOW (ref 3.5–5.0)
Alkaline Phosphatase: 425 U/L — ABNORMAL HIGH (ref 38–126)
Anion gap: 13 (ref 5–15)
BUN: 31 mg/dL — ABNORMAL HIGH (ref 6–20)
CO2: 18 mmol/L — ABNORMAL LOW (ref 22–32)
Calcium: 8.4 mg/dL — ABNORMAL LOW (ref 8.9–10.3)
Chloride: 101 mmol/L (ref 98–111)
Creatinine, Ser: 1.34 mg/dL — ABNORMAL HIGH (ref 0.61–1.24)
GFR, Estimated: >60 mL/min (ref >60)
Glucose, Bld: 92 mg/dL (ref 70–99)
Potassium: 4.2 mmol/L (ref 3.5–5.1)
Sodium: 132 mmol/L — ABNORMAL LOW (ref 135–145)
Total Bilirubin: 3.7 mg/dL — ABNORMAL HIGH (ref 0.3–1.2)
Total Protein: 5.3 g/dL — ABNORMAL LOW (ref 6.5–8.1)

## 2020-01-08 LAB — URINALYSIS, ROUTINE W REFLEX MICROSCOPIC
Bilirubin Urine: NEGATIVE
Glucose, UA: NEGATIVE mg/dL
Hgb urine dipstick: NEGATIVE
Ketones, ur: NEGATIVE mg/dL
Leukocytes,Ua: NEGATIVE
Nitrite: NEGATIVE
Protein, ur: 30 mg/dL — AB
Specific Gravity, Urine: 1.01 (ref 1.005–1.030)
pH: 5 (ref 5.0–8.0)

## 2020-01-08 LAB — RESP PANEL BY RT-PCR (RSV, FLU A&B, COVID)  RVPGX2
Influenza A by PCR: NEGATIVE
Influenza B by PCR: NEGATIVE
Resp Syncytial Virus by PCR: NEGATIVE
SARS Coronavirus 2 by RT PCR: NEGATIVE

## 2020-01-08 LAB — ECHOCARDIOGRAM COMPLETE
Area-P 1/2: 5.02 cm2
Height: 65 in
MV M vel: 5.03 m/s
MV Peak grad: 101.2 mmHg
Radius: 0.8 cm
S' Lateral: 3.4 cm
Weight: 1982.38 oz

## 2020-01-08 LAB — CBC
HCT: 32.4 % — ABNORMAL LOW (ref 39.0–52.0)
Hemoglobin: 11.2 g/dL — ABNORMAL LOW (ref 13.0–17.0)
MCH: 35.3 pg — ABNORMAL HIGH (ref 26.0–34.0)
MCHC: 34.6 g/dL (ref 30.0–36.0)
MCV: 102.2 fL — ABNORMAL HIGH (ref 80.0–100.0)
Platelets: 149 10*3/uL — ABNORMAL LOW (ref 150–400)
RBC: 3.17 MIL/uL — ABNORMAL LOW (ref 4.22–5.81)
RDW: 15.8 % — ABNORMAL HIGH (ref 11.5–15.5)
WBC: 6.1 10*3/uL (ref 4.0–10.5)
nRBC: 2.3 % — ABNORMAL HIGH (ref 0.0–0.2)

## 2020-01-08 LAB — PROTEIN / CREATININE RATIO, URINE
Creatinine, Urine: 66.49 mg/dL
Protein Creatinine Ratio: 0.39 mg/mg{Cre} — ABNORMAL HIGH (ref 0.00–0.15)
Total Protein, Urine: 26 mg/dL

## 2020-01-08 LAB — FERRITIN: Ferritin: 746 ng/mL — ABNORMAL HIGH (ref 24–336)

## 2020-01-08 LAB — VITAMIN B12: Vitamin B-12: 422 pg/mL (ref 180–914)

## 2020-01-08 LAB — FOLATE: Folate: 26.8 ng/mL (ref >5.9)

## 2020-01-08 MED ORDER — FUROSEMIDE 10 MG/ML IJ SOLN: 40.0000 mg | Freq: Every day | INTRAMUSCULAR | Status: DC

## 2020-01-08 MED ORDER — FUROSEMIDE 10 MG/ML IJ SOLN
20.0000 mg | Freq: Once | INTRAMUSCULAR | Status: AC
  Administered 2020-01-08: 20 mg via INTRAVENOUS
  Filled 2020-01-08: qty 2

## 2020-01-08 MED ORDER — FUROSEMIDE 10 MG/ML IJ SOLN
40.0000 mg | Freq: Once | INTRAMUSCULAR | Status: AC
  Administered 2020-01-08: 40 mg via INTRAVENOUS
  Filled 2020-01-08: qty 4

## 2020-01-08 MED ORDER — ENOXAPARIN SODIUM 40 MG/0.4ML ~~LOC~~ SOLN
40.0000 mg | Freq: Every day | SUBCUTANEOUS | Status: DC
  Administered 2020-01-08 – 2020-01-10 (×3): 40 mg via SUBCUTANEOUS
  Filled 2020-01-08 (×3): qty 0.4

## 2020-01-08 NOTE — Consult Note (Addendum)
[facility] Surgery Consult Note  Joseph Underwood Mar 30, 1965  [id number].    Requesting MD: Cato Mulligan MD Chief Complaint: lower extremity swelling with drainage from both the  right and left foot; increased DOE Reason for Consult: cholelithiasis RUQ pain  HPI:  Patient is a 54 year old male with a history of alcohol induced cirrhosis, alcohol induced seizures alcohol abuse, hypertension, who presented to the internal medicine clinic with a 2-week history of lower extremity edema and dyspnea on exertion.  He has chronic dyspnea on exertion but is progressively worse especially on day of admission.  He did report he had several alcoholic drinks on Saturday prior to onset of his symptoms.  He currently reports an average of 6-7 alcoholic beverages per week. currently.  He was admitted with lower extremity edema, shortness of breath, concern for decompensated liver cirrhosis after alcohol binge. He denies any abdominal pain before admission, but does have RUQ pain with palpation.  He has been eating normally without issue.  Normal BM just 30 minutes before I saw him.    Work-up shows the patient's afebrile he is tachycardic blood pressure elevated 137/99 on admission.  O2 sats are good on room air.  Sodium 132, BUN 28, creatinine 1.34, alk phos 448, albumin 2.7, AST 168, ALT 126, total protein 5.5, total bilirubin 3.7.  WBC 6.5, hemoglobin 11.8, hematocrit 33, platelets 165,000.  Hepatitis B is negative.  I do not see a test for hepatitis A or C.  Alcohol level 66.  Acetaminophen level less than 10 .  Hepatitis C PCR pending.  Abdominal ultrasound right upper quadrant shows gallstones measuring up to 5 mm in length.  The gallbladder wall is thickened and edematous with a degree of pericholecystic fluid, no sonographic Murphy sign.  Common bile duct was 7 mm upper normal limits.  No intrahepatic or extrahepatic biliary ductal dilatation.  Liver showed no focal lesions, liver contour is nodular with  diffuse increased echogenicity.  Portal vein is patent on color Doppler imaging with normal blood flow towards the liver.  Labs today again show sodium 132, creatinine 1.34, alk phos 425, AST 184, ALT 119, total bilirubin 3.7.  WBC 6.1 platelets are now down to 149,000.  INR 1.1.  I do not see a test for COVID. We are asked to see and evaluate for cholecystitis/cholelithiasis.  Echocardiogram is also pending.  We are ask to see.  ROS: Review of Systems  Constitutional:       Chronically ill appearing, malnourished AA male.  HENT: Negative.   Eyes: Negative.   Respiratory: Positive for shortness of breath.   Cardiovascular: Positive for leg swelling.  Gastrointestinal: Positive for abdominal pain (no complaints of pain, but pain in the RUQ with palpation). Negative for blood in stool, constipation, diarrhea, heartburn, melena, nausea and vomiting.       He has never had a colonoscopy  Genitourinary: Negative.   Musculoskeletal: Negative.   Skin: Negative.   Neurological: Positive for seizures (last seizure he is aware of was in July 2021).  Endo/Heme/Allergies: Negative.   Psychiatric/Behavioral: Negative.     Family History  Problem Relation Age of Onset  . Hypertension Mother   . Colon cancer Neg Hx   . Esophageal cancer Neg Hx   . Rectal cancer Neg Hx   . Stomach cancer Neg Hx     Past Medical History:  Diagnosis Date  . Alcohol abuse   . Alcohol related seizure ([place])   . Allergy   . Hiccups   .  HTN (hypertension)   . Hyperlipidemia   . Seizures ([place]) 03/10/2014    Past Surgical History:  Procedure Laterality Date  . NO PAST SURGERIES      Social History:  reports that he has been smoking cigarettes. He has a 11.10 pack-year smoking history. He has never used smokeless tobacco. He reports current alcohol use of about 88.0 standard drinks of alcohol per week. He reports that he does not use drugs.  Tobacco:< 1PPD x 39 years ETOH: 12 oz beer ~ 5 on Saturday, drinks  weekly, not daily Drugs: none  Lives with 74 y/o mother; works some part time Architect. Allergies: No Known Allergies  Medications Prior to Admission  Medication Sig Dispense Refill  . diltiazem (CARDIZEM CD) 120 MG 24 hr capsule Take 1 capsule (120 mg total) by mouth daily. 90 capsule 3  . fenofibrate (TRICOR) 145 MG tablet Take 1 tablet (145 mg total) by mouth daily. IM program 90 tablet 1  . levETIRAcetam (KEPPRA) 500 MG tablet Take 1 tablet (500 mg total) by mouth 2 (two) times daily. 60 tablet 0  . naltrexone (DEPADE) 50 MG tablet Take 1 tablet (50 mg total) by mouth daily. IM Program. 90 tablet 1  . phenytoin (DILANTIN) 300 MG ER capsule Take 1 capsule (300 mg total) by mouth daily. 30 capsule 0    Blood pressure (!) 118/95, pulse (!) 103, temperature 98.1 F (36.7 C), resp. rate 19, height _0  (1.651 m), weight 56.2 kg, SpO2 100 %. Physical Exam:  General: pleasant, thin appearing AA male who is laying in bed in NAD HEENT: head is normocephalic, atraumatic.  Sclera are noninjected.  Pupils are equal.  Ears and nose without any masses or lesions.  Mouth is pink and moist Neck:  JVD Heart: regular, rate, and rhythm, slightly tachycardic.  Normal s1,s2. No obvious murmurs, gallops, or rubs noted.  Palpable radial pulses bilaterally, +2-3 edema both lower legs/feet, pulses harder to find, no open lesions. Lungs: CTAB, no wheezes, rhonchi, or rales noted.  Respiratory effort nonlabored Abd: mildly distended, no peritonitis, +BS, no masses, hernias, no surgical scars, liver percusses to 7 cm below the costal margin MS: all 4 extremities are symmetrical with no cyanosis, clubbing, or +2-3  edema both lower legs and feet from knees down. Skin: warm and dry with no masses, lesions, or rashes Neuro: Cranial nerves 2-12 grossly intact, sensation is normal throughout Psych: A&Ox3 with an appropriate affect.   Results for orders placed or performed during the hospital encounter of  12/20/2019 (from the past 48 hour(s))  SARS Coronavirus 2 by RT PCR (hospital order, performed in [facility] hospital lab) Nasopharyngeal Nasopharyngeal Swab     Status: None   Collection Time: 12/25/2019  8:10 PM   Specimen: Nasopharyngeal Swab  Result Value Ref Range   SARS Coronavirus 2 NEGATIVE NEGATIVE    Comment: (NOTE) SARS-CoV-2 target nucleic acids are NOT DETECTED.  The SARS-CoV-2 RNA is generally detectable in upper and lower respiratory specimens during the acute phase of infection. The lowest concentration of SARS-CoV-2 viral copies this assay can detect is 250 copies / mL. A negative result does not preclude SARS-CoV-2 infection and should not be used as the sole basis for treatment or other patient management decisions.  A negative result may occur with improper specimen collection / handling, submission of specimen other than nasopharyngeal swab, presence of viral mutation(s) within the areas targeted by this assay, and inadequate number of viral copies (<250 copies / mL).  A negative result must be combined with clinical observations, patient history, and epidemiological information.  Fact Sheet for Patients:   StrictlyIdeas.no  Fact Sheet for Healthcare Providers: BankingDealers.co.za  This test is not yet approved or  cleared by the [place] FDA and has been authorized for detection and/or diagnosis of SARS-CoV-2 by FDA under an Emergency Use Authorization (EUA).  This EUA will remain in effect (meaning this test can be used) for the duration of the COVID-19 declaration under Section 564(b)(1) of the Act, 21 U.S.C. section 360bbb-3(b)(1), unless the authorization is terminated or revoked sooner.  Performed at [facility], [place] [address] [place], [place] [address]   Hepatitis B core antibody, IgM     Status: None   Collection Time: 01/02/2020  8:12 PM  Result Value Ref Range   Hep B C IgM NON REACTIVE NON  REACTIVE    Comment: Performed at [place] [address], [place], [place] [address]  Hepatitis B surface antigen     Status: None   Collection Time: 12/24/2019  8:12 PM  Result Value Ref Range   Hepatitis B Surface Ag NON REACTIVE NON REACTIVE    Comment: Performed at [place] [address], [place], [place] [address]  Hepatitis B surface antibody,qualitative     Status: None   Collection Time: 01/06/2020  8:12 PM  Result Value Ref Range   Hep B S Ab NON REACTIVE NON REACTIVE    Comment: (NOTE) Inconsistent with immunity, less than 10 mIU/mL.  Performed at [facility], [place] [address] [place], [place] [address]   Ethanol     Status: Abnormal   Collection Time: 12/31/2019  8:12 PM  Result Value Ref Range   Alcohol, Ethyl (B) 66 (H) <10 mg/dL    Comment: (NOTE) Lowest detectable limit for serum alcohol is 10 mg/dL.  For medical purposes only. Performed at [facility], [place] [address], [place], [place] [address]   Acetaminophen level     Status: Abnormal   Collection Time: 12/27/2019  8:12 PM  Result Value Ref Range   Acetaminophen (Tylenol), Serum <10 (L) 10 - 30 ug/mL    Comment: (NOTE) Therapeutic concentrations vary significantly. A range of 10-30 ug/mL  may be an effective concentration for many patients. However, some  are best treated at concentrations outside of this range. Acetaminophen concentrations >150 ug/mL at 4 hours after ingestion  and >50 ug/mL at 12 hours after ingestion are often associated with  toxic reactions.  Performed at [facility], [place] [address] [place],  [address]   HIV Antibody (routine testing w rflx)     Status: None   Collection Time: 12/20/2019  8:12 PM  Result Value Ref Range   HIV Screen 4th Generation wRfx Non Reactive Non Reactive    Comment: Performed at [facility], [place] [address], [place],  [address]  Comprehensive metabolic panel     Status: Abnormal   Collection Time:  01/03/2020  8:12 PM  Result Value Ref Range   Sodium 132 (L) 135 - 145 mmol/L   Potassium 4.0 3.5 - 5.1 mmol/L   Chloride 100 98 - 111 mmol/L   CO2 18 (L) 22 - 32 mmol/L   Glucose, Bld 75 70 - 99 mg/dL    Comment: Glucose reference range applies only to samples taken after fasting for at least 8 hours.   BUN 28 (H) 6 - 20 mg/dL  Creatinine, Ser 1.34 (H) 0.61 - 1.24 mg/dL   Calcium 8.2 (L) 8.9 - 10.3 mg/dL   Total Protein 5.5 (L) 6.5 - 8.1 g/dL   Albumin 2.7 (L) 3.5 - 5.0 g/dL   AST 168 (H) 15 - 41 U/L   ALT 126 (H) 0 - 44 U/L   Alkaline Phosphatase 448 (H) 38 - 126 U/L   Total Bilirubin 3.7 (H) 0.3 - 1.2 mg/dL   GFR, Estimated >60 >60 mL/min    Comment: (NOTE) Calculated using the CKD-EPI Creatinine Equation (2021)    Anion gap 14 5 - 15    Comment: Performed at [facility], [place] [address], [place], [place] [address]  CBC WITH DIFFERENTIAL     Status: Abnormal   Collection Time: 01/03/2020  8:12 PM  Result Value Ref Range   WBC 6.5 4.0 - 10.5 K/uL   RBC 3.29 (L) 4.22 - 5.81 MIL/uL   Hemoglobin 11.8 (L) 13.0 - 17.0 g/dL   HCT 33.6 (L) 39 - 52 %   MCV 102.1 (H) 80.0 - 100.0 fL   MCH 35.9 (H) 26.0 - 34.0 pg   MCHC 35.1 30.0 - 36.0 g/dL   RDW 15.8 (H) 11.5 - 15.5 %   Platelets 165 150 - 400 K/uL   nRBC 1.7 (H) 0.0 - 0.2 %   Neutrophils Relative % 73 %   Neutro Abs 4.8 1.7 - 7.7 K/uL   Lymphocytes Relative 15 %   Lymphs Abs 0.9 0.7 - 4.0 K/uL   Monocytes Relative 9 %   Monocytes Absolute 0.6 0.1 - 1.0 K/uL   Eosinophils Relative 0 %   Eosinophils Absolute 0.0 0.0 - 0.5 K/uL   Basophils Relative 1 %   Basophils Absolute 0.0 0.0 - 0.1 K/uL   Immature Granulocytes 2 %   Abs Immature Granulocytes 0.10 (H) 0.00 - 0.07 K/uL    Comment: Performed at [facility], [place] [address], [place], [place] [address]  Protime-INR     Status: None   Collection Time: 12/17/2019  8:12 PM  Result Value Ref Range   Prothrombin Time 14.0 11.4 - 15.2 seconds   INR 1.1 0.8 - 1.2     Comment: (NOTE) INR goal varies based on device and disease states. Performed at [facility], [place] [address], [place], [place] [address]   Comprehensive metabolic panel     Status: Abnormal   Collection Time: 01/08/20  3:14 AM  Result Value Ref Range   Sodium 132 (L) 135 - 145 mmol/L   Potassium 4.2 3.5 - 5.1 mmol/L   Chloride 101 98 - 111 mmol/L   CO2 18 (L) 22 - 32 mmol/L   Glucose, Bld 92 70 - 99 mg/dL    Comment: Glucose reference range applies only to samples taken after fasting for at least 8 hours.   BUN 31 (H) 6 - 20 mg/dL   Creatinine, Ser 1.34 (H) 0.61 - 1.24 mg/dL   Calcium 8.4 (L) 8.9 - 10.3 mg/dL   Total Protein 5.3 (L) 6.5 - 8.1 g/dL   Albumin 2.6 (L) 3.5 - 5.0 g/dL   AST 184 (H) 15 - 41 U/L   ALT 119 (H) 0 - 44 U/L   Alkaline Phosphatase 425 (H) 38 - 126 U/L   Total Bilirubin 3.7 (H) 0.3 - 1.2 mg/dL   GFR, Estimated >60 >60 mL/min    Comment: (NOTE) Calculated using the CKD-EPI Creatinine Equation (2021)    Anion gap 13  5 - 15    Comment: Performed at [facility], [place] [address], [place], [place] [address]  CBC     Status: Abnormal   Collection Time: 01/08/20  3:14 AM  Result Value Ref Range   WBC 6.1 4.0 - 10.5 K/uL   RBC 3.17 (L) 4.22 - 5.81 MIL/uL   Hemoglobin 11.2 (L) 13.0 - 17.0 g/dL   HCT 32.4 (L) 39 - 52 %   MCV 102.2 (H) 80.0 - 100.0 fL   MCH 35.3 (H) 26.0 - 34.0 pg   MCHC 34.6 30.0 - 36.0 g/dL   RDW 15.8 (H) 11.5 - 15.5 %   Platelets 149 (L) 150 - 400 K/uL   nRBC 2.3 (H) 0.0 - 0.2 %    Comment: Performed at [facility], [place] [address], [place], [place] [address]  Vitamin B12     Status: None   Collection Time: 01/08/20 11:19 AM  Result Value Ref Range   Vitamin B-12 422 180 - 914 pg/mL    Comment: (NOTE) This assay is not validated for testing neonatal or myeloproliferative syndrome specimens for Vitamin B12 levels. Performed at [facility], [place] [address] [place], [place] [address]   Folate, serum, performed at  [facility] lab     Status: None   Collection Time: 01/08/20 11:19 AM  Result Value Ref Range   Folate 26.8 >5.9 ng/mL    Comment: Performed at [facility], [place] [address] [place], [place] [address]  Ferritin     Status: Abnormal   Collection Time: 01/08/20 11:19 AM  Result Value Ref Range   Ferritin 746 (H) 24 - 336 ng/mL    Comment: Performed at [facility], [place] [address], [place], [place] [address]   US Abdomen Limited RUQ (LIVER/GB)  Result Date: 01/08/2020 CLINICAL DATA:  Hepatic cirrhosis EXAM: ULTRASOUND ABDOMEN LIMITED RIGHT UPPER QUADRANT COMPARISON:  CT abdomen and pelvis January 06, 2019. FINDINGS: Gallbladder: Within the gallbladder, there are echogenic foci which move and shadow consistent with cholelithiasis. Largest gallstone measures 5 mm in length. The gallbladder wall is thickened and edematous with a degree of pericholecystic fluid. No sonographic Murphy sign noted by sonographer. Common bile duct: Diameter: 7 mm, upper normal. No intrahepatic or extrahepatic biliary duct dilatation. Liver: No focal lesion identified. Liver contour is nodular with diffuse increase in liver echogenicity. Portal vein is patent on color Doppler imaging with normal direction of blood flow towards the liver. Other: There is a cyst in the lower pole of the right kidney measuring 1.1 x 0.7 x 1.1 cm. IMPRESSION: 1. Cholelithiasis with edematous, thickened gallbladder wall and pericholecystic fluid. Concern for acute cholecystitis given this appearance. 2. Nodular liver contour with diffuse increase in liver echogenicity, findings concerning for hepatic cirrhosis. No focal liver lesions evident. It must be cautioned that the sensitivity of ultrasound for detection of focal liver lesions is diminished in this circumstance. 3.  Small cyst lower pole right kidney. These results will be called to the ordering clinician or representative by the Radiologist Assistant, and communication documented  in the PACS or [employer]. Electronically Signed   By: Lowella Grip III M.D.   On: 01/08/2020 13:39       Assessment/Plan Fluid overload with Lower extremity swelling Hx Alcohol abuse Hx Cirrhosis Hx alcohol related seizures Ongoing tobacco use DOE Hx hypertension Hyperlipidemia AKI Hyponatremia Thrombocytopenia  - platelets 165K>>149K  RUQ pain with cholelithiasis Elevated LFT's  FEN:  NPO ID:  None DVT: Lovenox Follow up:  TBD  Plan:  Cirrhosis with fluid overload, Cholelithiasis, with elevated LFT's.  I do not think he has acute cholecystitis, he may have choledocholithiasis; Gi consult is pending.  We will add a HIDA and see if the liver takes up the isotope. I will order a COVID study also. We will follow with you.      Earnstine Regal [facility] Surgery 01/08/2020, 2:19 PM Please see Amion for pager number during day hours 7:00am-4:30pm

## 2020-01-08 NOTE — Consult Note (Addendum)
[facility] Consult: 2:53 PM 01/08/2020  LOS: 1 day    Referring Provider: Dr  Cato Mulligan, MD resident Primary Care Physician:  [name], MD Primary Gastroenterologist:  Dr. Wilfrid Lund   Reason for Consultation: Cholecystitis???   HPI: Joseph Underwood is a 54 y.o. male.  PMH Hyponatremia.  Alcoholism.  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  Seizure disorder.  06/2016 colonoscopy: This was his index screening, average risk study.  A total of 4 polyps were removed from various locations in the colon.  Size 4-12 mm.  Pandiverticulosis. Pathology of all polyps was tubular adenoma without HGD Patient has not responded to Dr. Corena Pilgrim callback colonoscopy letter sent in early May 2021.   Swelling and weeping from his legs for about 2 weeks, sores on both of his feet. Seen by PCP on 11/23 with significant lower extremity edema, oozing from the legs, crackles on lung exam, abdominal distention.  Admitted to the hospital for work-up.  BP 122/104, heart rate on 123 at arrival to ED.  No fever good oxygen saturations. CTAP w contrast: Hepatic steatosis, aortic atherosclerosis, otherwise unremarkable.  No ductal dilatation.  Pancreas normal. Abdominal ultrasound: Cholelithiasis, thickened, edematous gallbladder wall with pericholecystic fluid concerning for acute cholecystitis.  Nodular liver contour concerning for cirrhosis.  No focal liver lesions.  Small right kidney cyst. PV Dopplers with normal directional flow. 2D echocardiogram completed today, not yet read/no report yet.  T bili 3.7.  Alk phos 448 >> 425.  AST/ALT 168/126 >> 184/119. Hepatitis B surface antigen, surface antibody and core IgM all nonreactive.  Has not been checked for hep a or hep C. Na 132. Hgb 11.2.  MCV 102.  WBCs normal 6.1.   Platelets 149K.  INR 1.1 APAP level <10. ETOH level 66.  No abdominal pain, no N/V.  Good appetite but weight down from ~ 185 to 110 over last few years.  No abd swelling.    Drinks 12 pack of beer over 3 to 4 days.  Works part-time, custodial work.  Lives w his 45 something y/o mother.    No fm hx of liver, GB, intestinal/stomach disease.      Past Medical History:  Diagnosis Date  . Alcohol abuse   . Alcohol related seizure ([place])   . Allergy   . Hiccups   . HTN (hypertension)   . Hyperlipidemia   . Seizures ([place]) 03/10/2014    Past Surgical History:  Procedure Laterality Date  . NO PAST SURGERIES      Prior to Admission medications   Medication Sig Start Date End Date Taking? Authorizing Provider  diltiazem (CARDIZEM CD) 120 MG 24 hr capsule Take 1 capsule (120 mg total) by mouth daily. 01/23/19 01/23/20 Yes Helberg, Larkin Ina, MD  fenofibrate (TRICOR) 145 MG tablet Take 1 tablet (145 mg total) by mouth daily. IM program 01/23/19  Yes Ina Homes, MD  levETIRAcetam (KEPPRA) 500 MG tablet Take 1 tablet (500 mg total) by mouth 2 (two) times daily. 08/28/19  Yes Recardo Evangelist, PA-C  naltrexone (  DEPADE) 50 MG tablet Take 1 tablet (50 mg total) by mouth daily. IM Program. 01/23/19  Yes Ina Homes, MD  phenytoin (DILANTIN) 300 MG ER capsule Take 1 capsule (300 mg total) by mouth daily. 08/28/19  Yes Recardo Evangelist, PA-C    Scheduled Meds: . atorvastatin  20 mg Oral Daily  . diltiazem  120 mg Oral Daily  . enoxaparin (LOVENOX) injection  40 mg Subcutaneous Q24H  . fenofibrate  160 mg Oral Daily  . folic acid  1 mg Oral Daily  . [START ON 01/09/2020] furosemide  40 mg Intravenous Daily  . levETIRAcetam  500 mg Oral BID  . multivitamin with minerals  1 tablet Oral Daily  . pantoprazole  40 mg Oral Daily  . phenytoin  300 mg Oral Daily  . spironolactone  100 mg Oral Daily  . thiamine  100 mg Oral Daily   Or  . thiamine  100 mg Intravenous Daily   Infusions:  PRN  Meds: LORazepam **OR** LORazepam   Allergies as of 12/17/2019  . (No Known Allergies)    Family History  Problem Relation Age of Onset  . Hypertension Mother   . Colon cancer Neg Hx   . Esophageal cancer Neg Hx   . Rectal cancer Neg Hx   . Stomach cancer Neg Hx     Social History   Socioeconomic History  . Marital status: Single    Spouse name: Not on file  . Number of children: Not on file  . Years of education: Not on file  . Highest education level: Not on file  Occupational History  . Not on file  Tobacco Use  . Smoking status: Current Every Day Smoker    Packs/day: 0.30    Years: 37.00    Pack years: 11.10    Types: Cigarettes  . Smokeless tobacco: Never Used  . Tobacco comment: 4 -5 cigs per day  Substance and Sexual Activity  . Alcohol use: Yes    Alcohol/week: 88.0 standard drinks    Types: 14 Cans of beer, 74 Shots of liquor per week    Comment: Beer 40oz 4 days a week  . Drug use: No  . Sexual activity: Never  Other Topics Concern  . Not on file  Social History Narrative  . Not on file   Social Determinants of Health   Financial Resource Strain:   . Difficulty of Paying Living Expenses: Not on file  Food Insecurity:   . Worried About Charity fundraiser in the Last Year: Not on file  . Ran Out of Food in the Last Year: Not on file  Transportation Needs:   . Lack of Transportation (Medical): Not on file  . Lack of Transportation (Non-Medical): Not on file  Physical Activity:   . Days of Exercise per Week: Not on file  . Minutes of Exercise per Session: Not on file  Stress:   . Feeling of Stress : Not on file  Social Connections:   . Frequency of Communication with Friends and Family: Not on file  . Frequency of Social Gatherings with Friends and Family: Not on file  . Attends Religious Services: Not on file  . Active Member of Clubs or Organizations: Not on file  . Attends Archivist Meetings: Not on file  . Marital Status: Not on  file  Intimate Partner Violence:   . Fear of Current or Ex-Partner: Not on file  . Emotionally Abused: Not on file  .  Physically Abused: Not on file  . Sexually Abused: Not on file    REVIEW OF SYSTEMS: Constitutional:  No weakness, no fatigue.    ENT:  No nasal or oral bleeding Pulm: Shortness of breath, no cough. CV:  No palpitations, no LE edema.  No angina. GU:  No hematuria, no frequency GI: See HPI. Heme: No unusual bleeding or bruising. Transfusions: None. Neuro:  No headaches, no peripheral tingling or numbness.  Most recent seizure was around July for, 2021. Derm:  No itching, no rash or sores.  Endocrine:  No sweats or chills.  No polyuria or dysuria Immunization: Has not been vaccinated for Covid 19.  Just never got around to it, not opposed to it. Travel:  None beyond local counties in last few months.    PHYSICAL EXAM: Vital signs in last 24 hours: Vitals:   01/08/20 0630 01/08/20 0932  BP: (!) 115/96 (!) 118/95  Pulse: (!) 105 (!) 103  Resp: 20 19  Temp: 98.6 F (37 C) 98.1 F (36.7 C)  SpO2: 100% 100%   Wt Readings from Last 3 Encounters:  01/03/2020 56.2 kg  01/10/2020 48.6 kg  07/08/19 49.9 kg    General: Pleasant, thin, looks old for stated age, chronically ill-appearing. Head: No facial asymmetry or swelling.  No signs of head trauma. Eyes: No scleral icterus.  No conjunctival pallor.  EOMI Ears: Not hard of hearing Nose: No congestion or discharge Mouth: Oral mucosa is moist, pink, clear.  Tongue midline. Neck: No JVD, no masses, no thyromegaly Lungs: Clear bilaterally without labored breathing or cough. Heart: RRR.  No MRG.  S1, S2 present. Abdomen: Soft, thin.  Not protuberant.  No HSM, masses, bruits, hernias.  No fluid wave or obvious ascites..   Rectal: Deferred. Musc/Skeltl: No joint redness, swelling or gross deformity. Extremities: Pitting edema from the feet to the knees.  Wrinkling of the skin across the top of the foot.  Edema is tender  to palpation.  No erythema. Neurologic: Alert.  No tremors or asterixis.  Oriented x3.  Moves all 4 limbs, strength not tested.   Skin: Changes from edema in the skin of the lower legs, feet. Nodes: No cervical adenopathy Psych: Does not, cooperative, calm.  Fluid speech  Intake/Output from previous day: 11/23 0701 - 11/24 0700 In: 240 [P.O.:240] Out: 50 [Urine:50] Intake/Output this shift: Total I/O In: 240 [P.O.:240] Out: -   LAB RESULTS: Recent Labs    01/03/2020 2012 01/08/20 0314  WBC 6.5 6.1  HGB 11.8* 11.2*  HCT 33.6* 32.4*  PLT 165 149*   BMET Lab Results  Component Value Date   NA 132 (L) 01/08/2020   NA 132 (L) 01/05/2020   NA 133 (L) 08/22/2019   K 4.2 01/08/2020   K 4.0 01/06/2020   K 3.3 (L) 08/22/2019   CL 101 01/08/2020   CL 100 01/10/2020   CL 92 (L) 08/22/2019   CO2 18 (L) 01/08/2020   CO2 18 (L) 12/26/2019   CO2 26 08/22/2019   GLUCOSE 92 01/08/2020   GLUCOSE 75 12/18/2019   GLUCOSE 140 (H) 08/22/2019   BUN 31 (H) 01/08/2020   BUN 28 (H) 12/17/2019   BUN 14 08/22/2019   CREATININE 1.34 (H) 01/08/2020   CREATININE 1.34 (H) 12/22/2019   CREATININE 1.11 08/22/2019   CALCIUM 8.4 (L) 01/08/2020   CALCIUM 8.2 (L) 12/29/2019   CALCIUM 8.8 (L) 08/22/2019   LFT Recent Labs    01/05/2020 2012 01/08/20 0314  PROT 5.5*  5.3*  ALBUMIN 2.7* 2.6*  AST 168* 184*  ALT 126* 119*  ALKPHOS 448* 425*  BILITOT 3.7* 3.7*   PT/INR Lab Results  Component Value Date   INR 1.1 12/16/2019   INR 0.91 01/10/2018   INR 1.05 07/05/2016   Hepatitis Panel Recent Labs    01/02/2020 2012  HEPBSAG NON REACTIVE  HEPBIGM NON REACTIVE   C-Diff No components found for: CDIFF Lipase     Component Value Date/Time   LIPASE 38 01/06/2019 1803    Drugs of Abuse     Component Value Date/Time   LABOPIA NONE DETECTED 01/14/2019 0333   COCAINSCRNUR NONE DETECTED 01/14/2019 0333   LABBENZ NONE DETECTED 01/14/2019 0333   AMPHETMU NONE DETECTED 01/14/2019 0333    THCU NONE DETECTED 01/14/2019 0333   LABBARB NONE DETECTED 01/14/2019 0333     RADIOLOGY STUDIES: US Abdomen Limited RUQ (LIVER/GB)  Result Date: 01/08/2020 CLINICAL DATA:  Hepatic cirrhosis EXAM: ULTRASOUND ABDOMEN LIMITED RIGHT UPPER QUADRANT COMPARISON:  CT abdomen and pelvis January 06, 2019. FINDINGS: Gallbladder: Within the gallbladder, there are echogenic foci which move and shadow consistent with cholelithiasis. Largest gallstone measures 5 mm in length. The gallbladder wall is thickened and edematous with a degree of pericholecystic fluid. No sonographic Murphy sign noted by sonographer. Common bile duct: Diameter: 7 mm, upper normal. No intrahepatic or extrahepatic biliary duct dilatation. Liver: No focal lesion identified. Liver contour is nodular with diffuse increase in liver echogenicity. Portal vein is patent on color Doppler imaging with normal direction of blood flow towards the liver. Other: There is a cyst in the lower pole of the right kidney measuring 1.1 x 0.7 x 1.1 cm. IMPRESSION: 1. Cholelithiasis with edematous, thickened gallbladder wall and pericholecystic fluid. Concern for acute cholecystitis given this appearance. 2. Nodular liver contour with diffuse increase in liver echogenicity, findings concerning for hepatic cirrhosis. No focal liver lesions evident. It must be cautioned that the sensitivity of ultrasound for detection of focal liver lesions is diminished in this circumstance. 3.  Small cyst lower pole right kidney. These results will be called to the ordering clinician or representative by the Radiologist Assistant, and communication documented in the PACS or [employer]. Electronically Signed   By: Lowella Grip III M.D.   On: 01/08/2020 13:39     IMPRESSION:   *     Cirrhosis of the liver.  First noted on CT in May 2020.  No evidence for HCCa on ultrasound or CTAP within the last few days. INR WNL, platelets slightly reduced at 149K.  *    Elevated  LFTs, particularly his alk phos but T bili, transaminases as well. Ultrasound showing gallbladder wall thickening and edema, pericholecystic fluid, cirrhosis.  CBD diameter WNL on CT. none of the patient's symptoms point to symptomatic gallbladder disease  *   Alcoholism.  Chronic.  Elevated EtOH level  *    Weeping lower extremity edema.  Swelling somewhat improved overnight.  Now receiving IV Lasix 40 mg/daily and Aldactone 100 mg po daily.  *    Low BMI of 20.6, hypoalbuminemia at 2.6, steady weight loss of ~ 70 # over several years w/o anorexia.  This points to malnutrition of unknown clear stage.  *Hypertension.  PLAN:     *   Check ANA, IgG, antimitochondrial and smooth muscle antibodies to rule out any elements of autoimmune hepatitis. Await results of pending HCV testing. Should get vaccinated for Hep B.  Check Hep A to see if Hep  A vaccine needed.   Eventually should undergo upper endoscopy for variceal screening but this can be pursued as an outpatient.  *   Patient currently n.p.o. awaiting completion of hepatobiliary scan but I know for a fact that the nuc med staff has gone home and it is very unlikely this is going to be completed today or even tomorrow, Thanksgiving.  Therefore ordered a diet for the patient and n.p.o. after midnight in case nuc med can perform the HIDA tomorrow.  If HIDA scan is not done on Thanksgiving, it can be done on Friday.  He could eat meals on Thanksgiving, depending on timing of HIDA scan.  *   Please get pt initiated on Covid-19 vaccine, shot # 1 can be done inpt,  He is willing, just never got around to pursuing.    Azucena Freed  01/08/2020, 2:53 PM Phone [phone]

## 2020-01-08 NOTE — Progress Notes (Signed)
°  Echocardiogram 2D Echocardiogram has been performed.  Joseph Underwood 01/08/2020, 12:11 PM

## 2020-01-08 NOTE — Progress Notes (Signed)
Subjective:   Overnight, no acute events.  This morning, patient reports that his bilateral legs and feet feel sore which he attributes to the edema. He denies having worsening distention of his abdomen. He denies nausea or vomiting but endorses more frequent loose bowel movements. He understands the plan for today and has no further questions or concerns.  Objective:  Vital signs in last 24 hours: Vitals:   12/19/2019 2030 12/24/2019 2230 01/08/20 0230 01/08/20 0630  BP: (!) 135/105 (!) 118/96 (!) 124/95 (!) 115/96  Pulse: (!) 113 (!) 111 (!) 110 (!) 105  Resp: 20 20 18 20   Temp: 98.7 F (37.1 C) 98.9 F (37.2 C) 98.4 F (36.9 C) 98.6 F (37 C)  TempSrc: Oral Oral Oral Oral  SpO2: 98% 96% 98% 100%  Weight:      Height:      SpO2: 100 %  Intake/Output Summary (Last 24 hours) at 01/08/2020 0740 Last data filed at 01/06/2020 2000 Gross per 24 hour  Intake 240 ml  Output 50 ml  Net 190 ml   Filed Weights   01/04/2020 1800  Weight: 56.2 kg  Physical Exam Constitutional:      Appearance: He is ill-appearing.  Eyes:     General: No scleral icterus.    Conjunctiva/sclera: Conjunctivae normal.  Cardiovascular:     Rate and Rhythm: Normal rate and regular rhythm.  Pulmonary:     Effort: Pulmonary effort is normal. No respiratory distress.     Breath sounds: Normal breath sounds.  Abdominal:     General: Bowel sounds are normal. There is distension.     Palpations: Abdomen is soft.     Tenderness: There is abdominal tenderness.     Comments: Tenderness to palpation in right upper quadrant. No shifting dullness.  Musculoskeletal:     Right lower leg: Edema present.     Left lower leg: Edema present.     Comments: 3+ pitting edema of bilateral lower extremities  Skin:    General: Skin is warm and dry.     Coloration: Skin is not jaundiced or pale.    CBC Latest Ref Rng & Units 01/08/2020 01/12/2020 08/22/2019  WBC 4.0 - 10.5 K/uL 6.1 6.5 5.8  Hemoglobin 13.0 - 17.0 g/dL  11.2(L) 11.8(L) 12.0(L)  Hematocrit 39 - 52 % 32.4(L) 33.6(L) 34.8(L)  Platelets 150 - 400 K/uL 149(L) 165 219   CMP Latest Ref Rng & Units 01/08/2020 01/13/2020 08/22/2019  Glucose 70 - 99 mg/dL 92 75 140(H)  BUN 6 - 20 mg/dL 31(H) 28(H) 14  Creatinine 0.61 - 1.24 mg/dL 1.34(H) 1.34(H) 1.11  Sodium 135 - 145 mmol/L 132(L) 132(L) 133(L)  Potassium 3.5 - 5.1 mmol/L 4.2 4.0 3.3(L)  Chloride 98 - 111 mmol/L 101 100 92(L)  CO2 22 - 32 mmol/L 18(L) 18(L) 26  Calcium 8.9 - 10.3 mg/dL 8.4(L) 8.2(L) 8.8(L)  Total Protein 6.5 - 8.1 g/dL 5.3(L) 5.5(L) -  Total Bilirubin 0.3 - 1.2 mg/dL 3.7(H) 3.7(H) -  Alkaline Phos 38 - 126 U/L 425(H) 448(H) -  AST 15 - 41 U/L 184(H) 168(H) -  ALT 0 - 44 U/L 119(H) 126(H) -   Albumin - 2.6 INR - 1.1 Ethanol level - 66 Hepatitis B panel - negative Hepatitic C antibody - pending Ferritin - 746 Folate - 26.8 Vitamin B12 - 422 Protein / creatinine ratio - needs to be collected Urinalysis - needs to be collected  IMAGING: US Abdomen Limited RUQ (LIVER/GB)  Result Date: 01/08/2020 CLINICAL  DATA:  Hepatic cirrhosis EXAM: ULTRASOUND ABDOMEN LIMITED RIGHT UPPER QUADRANT COMPARISON:  CT abdomen and pelvis January 06, 2019. FINDINGS: Gallbladder: Within the gallbladder, there are echogenic foci which move and shadow consistent with cholelithiasis. Largest gallstone measures 5 mm in length. The gallbladder wall is thickened and edematous with a degree of pericholecystic fluid. No sonographic Murphy sign noted by sonographer. Common bile duct: Diameter: 7 mm, upper normal. No intrahepatic or extrahepatic biliary duct dilatation. Liver: No focal lesion identified. Liver contour is nodular with diffuse increase in liver echogenicity. Portal vein is patent on color Doppler imaging with normal direction of blood flow towards the liver. Other: There is a cyst in the lower pole of the right kidney measuring 1.1 x 0.7 x 1.1 cm. IMPRESSION: 1. Cholelithiasis with edematous,  thickened gallbladder wall and pericholecystic fluid. Concern for acute cholecystitis given this appearance. 2. Nodular liver contour with diffuse increase in liver echogenicity, findings concerning for hepatic cirrhosis. No focal liver lesions evident. It must be cautioned that the sensitivity of ultrasound for detection of focal liver lesions is diminished in this circumstance. 3.  Small cyst lower pole right kidney. These results will be called to the ordering clinician or representative by the Radiologist Assistant, and communication documented in the PACS or [employer]. Electronically Signed   By: Lowella Grip III M.D.   On: 01/08/2020 13:39   Assessment/Plan:  Active Problems:   Volume overload  Joseph Underwood is a 54 year old male with past medical history significant for alcohol induced liver cirrhosis who presented to the [facility] on 11/24 for evaluation of a 2-week history of lower extremity edema and dyspnea.  #Lower extremity edema and shortness of breath, active Patient's lower extremity edema is significantly improved this morning with only elevation of legs on his bed, however it is still substantial. Current differential for his edema includes decompensated cirrhosis, heart failure/cirrhotic cardiomyopathy/dilated cardiomyopathy, and nephrotic syndrome; although his presentation may be multifactorial. RUQ ultrasound demonstrates nodular liver contour with diffuse increase in liver echogenicity consistent with cirrhosis. Ferritin elevated to 746, therefore hemochromatosis is unlikely to be cause of cirrhosis and his alcohol use remains the most likely explanation. -follow-up hepatitis C panel -follow-up echocardiogram -Start spironolactone 100mg  daily -Start furosemide 40mg  IV daily -UA and urine protein/creatinine ratio needs to be collected  #Concern for acute calculous cholecystitis, active Patient with right upper quadrant tenderness on physical  examination. No leukocytosis, hyperbilirubinemia of 3.7 up from prior of 0.8 in November 2020. RUQ ultrasound revealing cholelithiasis with edematous, thickened gallbladder wall and pericholecystic fluid concerning for acute cholecystitis. -Consulted general surgery, appreciate recommendations  #Alcohol Use Disorder, chronic -CIWA with ativan -Continue keppra and phenytoin -Seizure precautions  #Macrocytic anemia, chronic Patient's hemoglobin stable at 11.2. Vitamin B12 and folate within normal limits. Marcocytosis likely secondary to significant alcohol use. -Daily CBC  #Hypertension, chronic -Continue diltiazem  #Hyperlipidemia, chronic -Continue fenofibrate  Cato Mulligan, MD 01/08/2020, 7:39 AM Pager: [phone] After 5pm on weekdays and 1pm on weekends: On Call pager [phone]

## 2020-01-09 DIAGNOSIS — E785 Hyperlipidemia, unspecified: Secondary | ICD-10-CM

## 2020-01-09 DIAGNOSIS — I361 Nonrheumatic tricuspid (valve) insufficiency: Secondary | ICD-10-CM

## 2020-01-09 DIAGNOSIS — I5023 Acute on chronic systolic (congestive) heart failure: Secondary | ICD-10-CM

## 2020-01-09 DIAGNOSIS — I10 Essential (primary) hypertension: Secondary | ICD-10-CM

## 2020-01-09 LAB — COMPREHENSIVE METABOLIC PANEL
ALT: 103 U/L — ABNORMAL HIGH (ref 0–44)
AST: 115 U/L — ABNORMAL HIGH (ref 15–41)
Albumin: 2.6 g/dL — ABNORMAL LOW (ref 3.5–5.0)
Alkaline Phosphatase: 434 U/L — ABNORMAL HIGH (ref 38–126)
Anion gap: 14 (ref 5–15)
BUN: 36 mg/dL — ABNORMAL HIGH (ref 6–20)
CO2: 20 mmol/L — ABNORMAL LOW (ref 22–32)
Calcium: 8.3 mg/dL — ABNORMAL LOW (ref 8.9–10.3)
Chloride: 97 mmol/L — ABNORMAL LOW (ref 98–111)
Creatinine, Ser: 1.91 mg/dL — ABNORMAL HIGH (ref 0.61–1.24)
GFR, Estimated: 41 mL/min — ABNORMAL LOW (ref >60)
Glucose, Bld: 92 mg/dL (ref 70–99)
Potassium: 4.8 mmol/L (ref 3.5–5.1)
Sodium: 131 mmol/L — ABNORMAL LOW (ref 135–145)
Total Bilirubin: 5.7 mg/dL — ABNORMAL HIGH (ref 0.3–1.2)
Total Protein: 5.2 g/dL — ABNORMAL LOW (ref 6.5–8.1)

## 2020-01-09 LAB — HEPATITIS A ANTIBODY, TOTAL: hep A Total Ab: NONREACTIVE

## 2020-01-09 LAB — CBC
HCT: 34.3 % — ABNORMAL LOW (ref 39.0–52.0)
Hemoglobin: 11.8 g/dL — ABNORMAL LOW (ref 13.0–17.0)
MCH: 35.8 pg — ABNORMAL HIGH (ref 26.0–34.0)
MCHC: 34.4 g/dL (ref 30.0–36.0)
MCV: 103.9 fL — ABNORMAL HIGH (ref 80.0–100.0)
Platelets: 155 10*3/uL (ref 150–400)
RBC: 3.3 MIL/uL — ABNORMAL LOW (ref 4.22–5.81)
RDW: 15.9 % — ABNORMAL HIGH (ref 11.5–15.5)
WBC: 7.8 10*3/uL (ref 4.0–10.5)
nRBC: 1.8 % — ABNORMAL HIGH (ref 0.0–0.2)

## 2020-01-09 LAB — IRON AND TIBC
Iron: 82 ug/dL (ref 45–182)
Saturation Ratios: 24 % (ref 17.9–39.5)
TIBC: 347 ug/dL (ref 250–450)
UIBC: 265 ug/dL

## 2020-01-09 LAB — HCV INTERPRETATION

## 2020-01-09 LAB — HCV AB W REFLEX TO QUANT PCR: HCV Ab: <0.1 s/co ratio (ref 0.0–0.9)

## 2020-01-09 MED ORDER — OXYCODONE HCL 5 MG PO TABS
5.0000 mg | ORAL_TABLET | ORAL | Status: DC | PRN
  Administered 2020-01-09 (×2): 5 mg via ORAL
  Filled 2020-01-09 (×2): qty 1

## 2020-01-09 MED ORDER — FUROSEMIDE 10 MG/ML IJ SOLN
40.0000 mg | Freq: Every day | INTRAMUSCULAR | Status: DC
  Administered 2020-01-09: 40 mg via INTRAVENOUS
  Filled 2020-01-09: qty 4

## 2020-01-09 MED ORDER — METOPROLOL SUCCINATE ER 25 MG PO TB24
25.0000 mg | ORAL_TABLET | Freq: Every day | ORAL | Status: DC
  Administered 2020-01-09: 25 mg via ORAL
  Filled 2020-01-09: qty 1

## 2020-01-09 NOTE — Progress Notes (Signed)
Subjective/Chief Complaint: Denies abdominal pain or nausea. Reports losing 20lb or more in the last few months. Unclear how accurate this is based on comparing Epic records. Reports he does not drink as much as he used to.    Objective: Vital signs in last 24 hours: Temp:  [97.8 F (36.6 C)-98.1 F (36.7 C)] 97.8 F (36.6 C) (11/25 0439) Pulse Rate:  [91-103] 91 (11/25 0439) Resp:  [18-19] 18 (11/25 0439) BP: (94-118)/(82-95) 115/95 (11/25 0439) SpO2:  [99 %-100 %] 99 % (11/25 0439) Last BM Date: 01/08/20  Intake/Output from previous day: 11/24 0701 - 11/25 0700 In: 480 [P.O.:480] Out: 0  Intake/Output this shift: No intake/output data recorded.  General appearance: alert and cooperative, +fetor hepaticus Resp: mild nonproductive cough and some shortness of breath with conversation Cardio: regular rate and rhythm GI: soft, distended, dull to percussion. hepatomegaly and epigastric/ right hemiabdominal mild tenderness.  Lab Results:  Recent Labs    12/21/2019 2012 01/08/20 0314  WBC 6.5 6.1  HGB 11.8* 11.2*  HCT 33.6* 32.4*  PLT 165 149*   BMET Recent Labs    01/06/2020 2012 01/08/20 0314  NA 132* 132*  K 4.0 4.2  CL 100 101  CO2 18* 18*  GLUCOSE 75 92  BUN 28* 31*  CREATININE 1.34* 1.34*  CALCIUM 8.2* 8.4*   PT/INR Recent Labs    12/23/2019 2012  LABPROT 14.0  INR 1.1   ABG No results for input(s): PHART, HCO3 in the last 72 hours.  Invalid input(s): PCO2, PO2  Studies/Results: ECHOCARDIOGRAM COMPLETE  Result Date: 01/08/2020    ECHOCARDIOGRAM REPORT   Patient Name:   Joseph Underwood Date of Exam: 01/08/2020 Medical Rec #:  [id number]     Height:       65.0 in Accession #:    [id number]    Weight:       123.9 lb Date of Birth:  1965-08-11    BSA:          1.614 m Patient Age:    54 years      BP:           118/95 mmHg Patient Gender: M             HR:           103 bpm. Exam Location:  Inpatient Procedure: 2D Echo Indications:    786.09 dyspnea   History:        Patient has prior history of Echocardiogram examinations, most                 recent 07/28/2011. Risk Factors:Hypertension, Dyslipidemia and                 Current Smoker. ETOH Abuse.  Sonographer:    Jannett Celestine RDCS (AE) Referring Phys: [id number] Raynaldo Opitz RAINES IMPRESSIONS  1. Left ventricular ejection fraction, by estimation, is 30 to 35%. The left ventricle has moderately decreased function. The left ventricle demonstrates global hypokinesis. Left ventricular diastolic parameters were normal.  2. Right ventricular systolic function is moderately reduced. The right ventricular size is severely enlarged. There is mildly elevated pulmonary artery systolic pressure. The estimated right ventricular systolic pressure is 75.6 mmHg.  3. Left atrial size was moderately dilated.  4. A small pericardial effusion is present.  5. The mitral valve is normal in structure. Moderate mitral valve regurgitation. No evidence of mitral stenosis.  6. Tricuspid valve regurgitation is moderate to severe.  7. The aortic valve is  tricuspid. There is mild calcification of the aortic valve. Aortic valve regurgitation is not visualized. No aortic stenosis is present.  8. Moderately dilated pulmonary artery.  9. The inferior vena cava is dilated in size with <50% respiratory variability, suggesting right atrial pressure of 15 mmHg. Comparison(s): Changes from prior study are noted. Conclusion(s)/Recommendation(s): EF now reduced to 30-35%. RV incompletely visualized but appears dilated, with moderate to severe TR. Also with moderate MR. Some ascites seen as well, and IVC dilated. Findings communicated with Dr. Rebeca Alert at 5:47 PM. FINDINGS  Left Ventricle: Left ventricular ejection fraction, by estimation, is 30 to 35%. The left ventricle has moderately decreased function. The left ventricle demonstrates global hypokinesis. The left ventricular internal cavity size was small. There is no left ventricular hypertrophy. Left  ventricular diastolic parameters were normal. Right Ventricle: The right ventricular size is severely enlarged. Right vetricular wall thickness was not well visualized. Right ventricular systolic function is moderately reduced. There is mildly elevated pulmonary artery systolic pressure. The tricuspid regurgitant velocity is 2.63 m/s, and with an assumed right atrial pressure of 15 mmHg, the estimated right ventricular systolic pressure is 26.9 mmHg. Left Atrium: Left atrial size was moderately dilated. Right Atrium: Right atrial size was not well visualized. Pericardium: A small pericardial effusion is present. Mitral Valve: The mitral valve is normal in structure. There is mild thickening of the mitral valve leaflet(s). Moderate mitral valve regurgitation. No evidence of mitral valve stenosis. Tricuspid Valve: The tricuspid valve is normal in structure. Tricuspid valve regurgitation is moderate to severe. Aortic Valve: The aortic valve is tricuspid. There is mild calcification of the aortic valve. Aortic valve regurgitation is not visualized. No aortic stenosis is present. Pulmonic Valve: The pulmonic valve was grossly normal. Pulmonic valve regurgitation is not visualized. No evidence of pulmonic stenosis. Aorta: The aortic root and ascending aorta are structurally normal, with no evidence of dilitation. Pulmonary Artery: The pulmonary artery is moderately dilated. Venous: The inferior vena cava is dilated in size with less than 50% respiratory variability, suggesting right atrial pressure of 15 mmHg. IAS/Shunts: The atrial septum is grossly normal. Additional Comments: Moderate ascites is present.  LEFT VENTRICLE PLAX 2D LVIDd:         4.30 cm  Diastology LVIDs:         3.40 cm  LV e' medial:    9.03 cm/s LV PW:         1.00 cm  LV E/e' medial:  11.8 LV IVS:        1.10 cm  LV e' lateral:   9.57 cm/s LVOT diam:     2.00 cm  LV E/e' lateral: 11.2 LV SV:         17 LV SV Index:   11 LVOT Area:     3.14 cm  LEFT  ATRIUM           Index LA diam:      3.70 cm 2.29 cm/m LA Vol (A2C): 73.9 ml 45.79 ml/m  AORTIC VALVE LVOT Vmax:   52.20 cm/s LVOT Vmean:  37.600 cm/s LVOT VTI:    0.055 m  AORTA Ao Root diam: 2.60 cm MITRAL VALVE                 TRICUSPID VALVE MV Area (PHT): 5.02 cm      TR Peak grad:   27.7 mmHg MV Decel Time: 151 msec      TR Vmax:        263.00 cm/s  MR Peak grad:    101.2 mmHg MR Mean grad:    57.0 mmHg   SHUNTS MR Vmax:         503.00 cm/s Systemic VTI:  0.06 m MR Vmean:        335.0 cm/s  Systemic Diam: 2.00 cm MR PISA:         4.02 cm MR PISA Eff ROA: 27 mm MR PISA Radius:  0.80 cm MV E velocity: 107.00 cm/s Buford Dresser MD Electronically signed by Buford Dresser MD Signature Date/Time: 01/08/2020/5:47:16 PM    Final    US Abdomen Limited RUQ (LIVER/GB)  Result Date: 01/08/2020 CLINICAL DATA:  Hepatic cirrhosis EXAM: ULTRASOUND ABDOMEN LIMITED RIGHT UPPER QUADRANT COMPARISON:  CT abdomen and pelvis January 06, 2019. FINDINGS: Gallbladder: Within the gallbladder, there are echogenic foci which move and shadow consistent with cholelithiasis. Largest gallstone measures 5 mm in length. The gallbladder wall is thickened and edematous with a degree of pericholecystic fluid. No sonographic Murphy sign noted by sonographer. Common bile duct: Diameter: 7 mm, upper normal. No intrahepatic or extrahepatic biliary duct dilatation. Liver: No focal lesion identified. Liver contour is nodular with diffuse increase in liver echogenicity. Portal vein is patent on color Doppler imaging with normal direction of blood flow towards the liver. Other: There is a cyst in the lower pole of the right kidney measuring 1.1 x 0.7 x 1.1 cm. IMPRESSION: 1. Cholelithiasis with edematous, thickened gallbladder wall and pericholecystic fluid. Concern for acute cholecystitis given this appearance. 2. Nodular liver contour with diffuse increase in liver echogenicity, findings concerning for hepatic cirrhosis. No  focal liver lesions evident. It must be cautioned that the sensitivity of ultrasound for detection of focal liver lesions is diminished in this circumstance. 3.  Small cyst lower pole right kidney. These results will be called to the ordering clinician or representative by the Radiologist Assistant, and communication documented in the PACS or [employer]. Electronically Signed   By: Lowella Grip III M.D.   On: 01/08/2020 13:39    Anti-infectives: Anti-infectives (From admission, onward)   None      Assessment/Plan: Fluid overload with Lower extremity swelling Hx Alcohol abuse Hx Cirrhosis Hx alcohol related seizures Ongoing tobacco use DOE Hx hypertension Hyperlipidemia AKI Hyponatremia Thrombocytopenia  - platelets 165K>>149K  RUQ pain with cholelithiasis, edematous gb wall Elevated LFT's -suspect this is all secondary to liver failure, not cholecystitis.  FEN:  NPO ID:  None DVT: Lovenox Follow up:  TBD  Plan:  HIDA, MRCP if tibili rises significantly. He is a very high risk surgical candidate.     LOS: 2 days    Clovis Riley 01/09/2020

## 2020-01-09 NOTE — Progress Notes (Signed)
Subjective:   Overnight, no acute events.  This morning, patient reports that his primary complaint is discomfort in his bilateral lower extremities. He denies abdominal pain "unless someone pushes on it." He has an appetite and denies nausea, vomiting or diarrhea. He has very minimal shortness of breath. He has no further complaints or concerns.  Objective:  Vital signs in last 24 hours: Vitals:   01/08/20 2100 01/09/20 0439 01/09/20 1005 01/09/20 1008  BP:  (!) 115/95 (!) 123/100 (!) 123/100  Pulse: 92 91  94  Resp: 18 18  18   Temp: 98 F (36.7 C) 97.8 F (36.6 C)  98.2 F (36.8 C)  TempSrc: Oral Oral  Oral  SpO2: 99% 99%  96%  Weight:      Height:      SpO2: 96 %  Intake/Output Summary (Last 24 hours) at 01/09/2020 1023 Last data filed at 01/09/2020 0900 Gross per 24 hour  Intake 240 ml  Output 0 ml  Net 240 ml   Filed Weights   12/21/2019 1800  Weight: 56.2 kg  Physical Exam Constitutional:      Appearance: He is ill-appearing.  Eyes:     General: No scleral icterus.    Conjunctiva/sclera: Conjunctivae normal.  Cardiovascular:     Rate and Rhythm: Normal rate and regular rhythm.  Pulmonary:     Effort: Pulmonary effort is normal. No respiratory distress.     Breath sounds: Normal breath sounds.  Abdominal:     General: Bowel sounds are normal. There is distension.     Palpations: Abdomen is soft.     Tenderness: There is abdominal tenderness.     Comments: Tenderness to palpation in right upper quadrant. No shifting dullness.  Musculoskeletal:     Right lower leg: Edema present.     Left lower leg: Edema present.     Comments: 3+ pitting edema of bilateral lower extremities  Skin:    General: Skin is warm and dry.     Coloration: Skin is not jaundiced or pale.    CBC Latest Ref Rng & Units 01/09/2020 01/08/2020 12/30/2019  WBC 4.0 - 10.5 K/uL 7.8 6.1 6.5  Hemoglobin 13.0 - 17.0 g/dL 11.8(L) 11.2(L) 11.8(L)  Hematocrit 39 - 52 % 34.3(L) 32.4(L)  33.6(L)  Platelets 150 - 400 K/uL 155 149(L) 165   CMP Latest Ref Rng & Units 01/09/2020 01/08/2020 01/05/2020  Glucose 70 - 99 mg/dL 92 92 75  BUN 6 - 20 mg/dL 36(H) 31(H) 28(H)  Creatinine 0.61 - 1.24 mg/dL 1.91(H) 1.34(H) 1.34(H)  Sodium 135 - 145 mmol/L 131(L) 132(L) 132(L)  Potassium 3.5 - 5.1 mmol/L 4.8 4.2 4.0  Chloride 98 - 111 mmol/L 97(L) 101 100  CO2 22 - 32 mmol/L 20(L) 18(L) 18(L)  Calcium 8.9 - 10.3 mg/dL 8.3(L) 8.4(L) 8.2(L)  Total Protein 6.5 - 8.1 g/dL 5.2(L) 5.3(L) 5.5(L)  Total Bilirubin 0.3 - 1.2 mg/dL 5.7(H) 3.7(H) 3.7(H)  Alkaline Phos 38 - 126 U/L 434(H) 425(H) 448(H)  AST 15 - 41 U/L 115(H) 184(H) 168(H)  ALT 0 - 44 U/L 103(H) 119(H) 126(H)   Hepatitis A antibody - in process Anti-smooth muscle antibody, IgG - in process Mitochondrial antibodies - in process ANA, IFA (with reflex) - in process IgG - in process Protein/creatinine ratio - 0.39 Urinalysis - unremarkable Hepatitis C antibody - negative Iron panel - unremarkable  IMAGING: ECHOCARDIOGRAM COMPLETE IMPRESSIONS  1. Left ventricular ejection fraction, by estimation, is 30 to 35%. The left ventricle has moderately decreased  function. The left ventricle demonstrates global hypokinesis. Left ventricular diastolic parameters were normal.  2. Right ventricular systolic function is moderately reduced. The right ventricular size is severely enlarged. There is mildly elevated pulmonary artery systolic pressure. The estimated right ventricular systolic pressure is 31.4 mmHg.  3. Left atrial size was moderately dilated.  4. A small pericardial effusion is present.  5. The mitral valve is normal in structure. Moderate mitral valve regurgitation. No evidence of mitral stenosis.  6. Tricuspid valve regurgitation is moderate to severe.  7. The aortic valve is tricuspid. There is mild calcification of the aortic valve. Aortic valve regurgitation is not visualized. No aortic stenosis is present.  8. Moderately dilated  pulmonary artery.  9. The inferior vena cava is dilated in size with <50% respiratory variability, suggesting right atrial pressure of 15 mmHg.  US Abdomen Limited RUQ (LIVER/GB) IMPRESSION: 1. Cholelithiasis with edematous, thickened gallbladder wall and pericholecystic fluid. Concern for acute cholecystitis given this appearance. 2. Nodular liver contour with diffuse increase in liver echogenicity, findings concerning for hepatic cirrhosis. No focal liver lesions evident. It must be cautioned that the sensitivity of ultrasound for detection of focal liver lesions is diminished in this circumstance. 3.  Small cyst lower pole right kidney.  Assessment/Plan:  Active Problems:   Alcohol use disorder, moderate, dependence (HCC)   Cirrhosis (HCC)   Epilepsy ([place])   Volume overload   Lower extremity edema   Macrocytic anemia  Joseph Underwood is a 54 year old male with past medical history significant for alcohol induced liver cirrhosis who presented to the [facility] on 11/24 for evaluation of a 2-week history of lower extremity edema and dyspnea.  #Heart failure with reduced ejection fraction #Moderate to severe tricuspid regurgitation #Moderate mitral valve regurgitation Patient's echocardiogram revealing LVEF of 30-35% with global hypokinesis and moderately reduced right ventricular systolic function with severe dilation. Patient received 60mg  IV lasix yesterday with no recorded urine output.  -Consulted cardiology, appreciate recommendations -Furosemide dosing per cardiology, initial dosing of 40mg  IV lasix twice daily -Strict I/O -Daily weights -Monitor renal function in setting of diuresis  #Cirrhosis, chronic Likely secondary to significant alcohol use, however patient may have concomitant liver disease such as autoimmune hepatitis. Negative hepatitis B/C. MELD (19) and child pugh score B conveying 30% risk with intra-abdominal surgery. Transaminitis relatively  stable, however hyperbilirubinemia worsening overnight from 3.7 to 5.7. -GI consulted appreciate recommendations:  -Hepatitis A antibody  -Anti-smooth muscle antibody  -IgG  -Mitochondrial antibodies   -ANA, IFA (with reflex)  -IgG  -Iron panel -Continue spironolactone 100mg  daily -Daily CMP -Will need outpatient endoscopy for esophageal varices screening -Will need outpatient colonoscopy for routine CRC screening -Will need hepatitis B vaccine series  #Concern for acute cholecystitis, active Patient with right upper quadrant tenderness on physical examination. No leukocytosis, hyperbilirubinemia of 5.7 up from 3.7 on admission and up from 0.8 in November 2020. RUQ ultrasound revealing cholelithiasis with edematous, thickened gallbladder wall and pericholecystic fluid concerning for acute cholecystitis. Radiologic findings may be secondary to liver failure rather than cholecystitis. -Consulted general surgery and GI, appreciate recommendations  -Trend LFTs  -HIDA when able  -MRCP if total bilirubin rises significantly  #AKI, active Patient had elevation of creatinine from 1.34 on admission to 1.91 overnight. Likely secondary to initiation of diuretic regimen. -Adjust diuretic regimen per cardiology recommendations -Monitor renal function on daily CMP -Avoid nephrotoxins  #Alcohol Use Disorder, chronic -CIWA with ativan -Continue discussing importance of alcohol cessation -Thiamine 100mg  daily -  Folic acid 160mg  daily -MVI daily  #Epilepsy, chronic -Continue home levetiracetam 500mg  twice daily -Continue home phenytoin 300mg  daily -Seizure precautions  #Macrocytic anemia, chronic Patient's hemoglobin stable at 11.8. Vitamin B12, folate and iron panel within normal limits. Marcocytosis likely secondary to significant alcohol use. -Daily CBC  #Hypertension, chronic -Continue diltiazem 120mg  daily  #Hyperlipidemia, chronic -Continue fenofibrate 160mg  daily -Continue  atorvastatin 20mg  daily  #Diet: Regular, NPO prior to HIDA #Code status: Full code #Bowel regimen: None #PT/OT recs: N/A #IVF: None #VTE ppx: Lovenox daily  Cato Mulligan, MD 01/09/2020, 10:23 AM Pager: [phone] After 5pm on weekdays and 1pm on weekends: On Call pager [phone]

## 2020-01-09 NOTE — Plan of Care (Signed)
  Problem: Health Behavior/Discharge Planning: Goal: Ability to manage health-related needs will improve Outcome: Progressing   

## 2020-01-09 NOTE — Consult Note (Addendum)
Cardiology Consultation:   Patient ID: Joseph Underwood MRN: [id number]; DOB: April 03, 1965  Admit date: 01/12/2020 Date of Consult: 01/09/2020  Primary Care Provider: [name], MD [facility] Cardiologist: Candee Furbish, MD  [facility] Electrophysiologist:  None    Patient Profile:   Joseph Underwood is a 54 y.o. male with a hx of alcohol induced cirrhosis, alcohol induced seizures, alcohol abuse, current smoker, HTN, and HLD who is being seen today for the evaluation of CHF at the request of Dr. Rebeca Alert.  History of Present Illness:   [name] has a history of alcohol use and presented to the ER following alcohol consumption and a progression of dyspnea and lower extremity swelling. He also complained of RUQ pain. US showed thickened gallbladder wall and surgery was consulted. He also had echocardiogram that showed new reduced EF of 30-34%, moderately reduced RV function, RV severely enlarged, moderate dilation of left atrium, small pericardial effusion, moderate to severe TR, and elevated right atrial pressure. Cardiology was consulted.   On my interview, he largely denies daily alcohol use, dyspnea, orthopnea, and prior cardiac history. He reports lower extremity swelling started 2 weeks, but was not accompanying by SOB, DOE, orthopnea, or PND. He also denies angina. He denies hx of MI or stroke. No significant family history of heart disease, although his mother has a PPM. He does smoke cigarettes.     Past Medical History:  Diagnosis Date  . Alcohol abuse   . Alcohol related seizure ([place])   . Alcoholic hepatitis   . Allergy   . Cirrhosis of liver ([place])    Assumed secondary to ETOH.  First noted on CTAP of 06/2018.  Marland Kitchen Colon polyps 06/2016   For polyps removed at colonoscopy.  All were tubular adenomas without high-grade dysplasia.  Marland Kitchen Hiccups   . HTN (hypertension)   . Hyperlipidemia   . Seizures ([place]) 03/10/2014    Past Surgical History:  Procedure Laterality Date  .  COLONOSCOPY W/ POLYPECTOMY  06/2016   Dr. Wilfrid Lund.  4 tubular adenomatous polyps removed, pandiverticulosis.  . NO PAST SURGERIES       Home Medications:  Prior to Admission medications   Medication Sig Start Date End Date Taking? Authorizing Provider  diltiazem (CARDIZEM CD) 120 MG 24 hr capsule Take 1 capsule (120 mg total) by mouth daily. 01/23/19 01/23/20 Yes Helberg, Larkin Ina, MD  fenofibrate (TRICOR) 145 MG tablet Take 1 tablet (145 mg total) by mouth daily. IM program 01/23/19  Yes Ina Homes, MD  levETIRAcetam (KEPPRA) 500 MG tablet Take 1 tablet (500 mg total) by mouth 2 (two) times daily. 08/28/19  Yes Recardo Evangelist, PA-C  naltrexone (DEPADE) 50 MG tablet Take 1 tablet (50 mg total) by mouth daily. IM Program. 01/23/19  Yes Ina Homes, MD  phenytoin (DILANTIN) 300 MG ER capsule Take 1 capsule (300 mg total) by mouth daily. 08/28/19  Yes Recardo Evangelist, PA-C    Inpatient Medications: Scheduled Meds: . atorvastatin  20 mg Oral Daily  . diltiazem  120 mg Oral Daily  . enoxaparin (LOVENOX) injection  40 mg Subcutaneous QHS  . fenofibrate  160 mg Oral Daily  . folic acid  1 mg Oral Daily  . levETIRAcetam  500 mg Oral BID  . multivitamin with minerals  1 tablet Oral Daily  . pantoprazole  40 mg Oral Daily  . phenytoin  300 mg Oral Daily  . spironolactone  100 mg Oral Daily  . thiamine  100 mg Oral  Daily   Or  . thiamine  100 mg Intravenous Daily   Continuous Infusions:  PRN Meds: LORazepam **OR** LORazepam  Allergies:   No Known Allergies  Social History:   Social History   Socioeconomic History  . Marital status: Single    Spouse name: Not on file  . Number of children: Not on file  . Years of education: Not on file  . Highest education level: Not on file  Occupational History  . Not on file  Tobacco Use  . Smoking status: Current Every Day Smoker    Packs/day: 0.30    Years: 37.00    Pack years: 11.10    Types: Cigarettes  . Smokeless  tobacco: Never Used  . Tobacco comment: 4 -5 cigs per day  Substance and Sexual Activity  . Alcohol use: Yes    Alcohol/week: 88.0 standard drinks    Types: 14 Cans of beer, 74 Shots of liquor per week    Comment: Beer 40oz 4 days a week  . Drug use: No  . Sexual activity: Never  Other Topics Concern  . Not on file  Social History Narrative  . Not on file   Social Determinants of Health   Financial Resource Strain:   . Difficulty of Paying Living Expenses: Not on file  Food Insecurity:   . Worried About Charity fundraiser in the Last Year: Not on file  . Ran Out of Food in the Last Year: Not on file  Transportation Needs:   . Lack of Transportation (Medical): Not on file  . Lack of Transportation (Non-Medical): Not on file  Physical Activity:   . Days of Exercise per Week: Not on file  . Minutes of Exercise per Session: Not on file  Stress:   . Feeling of Stress : Not on file  Social Connections:   . Frequency of Communication with Friends and Family: Not on file  . Frequency of Social Gatherings with Friends and Family: Not on file  . Attends Religious Services: Not on file  . Active Member of Clubs or Organizations: Not on file  . Attends Archivist Meetings: Not on file  . Marital Status: Not on file  Intimate Partner Violence:   . Fear of Current or Ex-Partner: Not on file  . Emotionally Abused: Not on file  . Physically Abused: Not on file  . Sexually Abused: Not on file    Family History:    Family History  Problem Relation Age of Onset  . Hypertension Mother   . Colon cancer Neg Hx   . Esophageal cancer Neg Hx   . Rectal cancer Neg Hx   . Stomach cancer Neg Hx      ROS:  Please see the history of present illness.   All other ROS reviewed and negative.     Physical Exam/Data:   Vitals:   01/08/20 2100 01/09/20 0439 01/09/20 1005 01/09/20 1008  BP:  (!) 115/95 (!) 123/100 (!) 123/100  Pulse: 92 91  94  Resp: 18 18  18   Temp: 98 F (36.7  C) 97.8 F (36.6 C)  98.2 F (36.8 C)  TempSrc: Oral Oral  Oral  SpO2: 99% 99%  96%  Weight:      Height:        Intake/Output Summary (Last 24 hours) at 01/09/2020 1024 Last data filed at 01/09/2020 0900 Gross per 24 hour  Intake 240 ml  Output 0 ml  Net 240 ml  Last 3 Weights 01/06/2020 01/09/2020 07/08/2019  Weight (lbs) 123 lb 14.4 oz 107 lb 3.2 oz 110 lb  Weight (kg) 56.2 kg 48.626 kg 49.896 kg     Body mass index is 20.62 kg/m.  General:  Elderly male, appears older that stated age, seen while lying almost completely flat sleeping, NAD HEENT: normal Neck: no JVD Vascular: No carotid bruits  Cardiac:  normal S1, S2; RRR; no murmur  Lungs:  Unlabored, coarse sounds throughout Abd: soft, nontender, no hepatomegaly  Ext: 3-4+ LE pitting edema Musculoskeletal:  No deformities, BUE and BLE strength normal and equal Skin: warm and dry  Neuro:  CNs 2-12 intact, no focal abnormalities noted Psych:  Normal affect   EKG:  The EKG was personally reviewed and demonstrates:  N/A Telemetry:  Telemetry was personally reviewed and demonstrates:  Sinus HR 90s, PVCs  Relevant CV Studies:  Echo 01/08/20: 1. Left ventricular ejection fraction, by estimation, is 30 to 35%. The  left ventricle has moderately decreased function. The left ventricle  demonstrates global hypokinesis. Left ventricular diastolic parameters  were normal.  2. Right ventricular systolic function is moderately reduced. The right  ventricular size is severely enlarged. There is mildly elevated pulmonary  artery systolic pressure. The estimated right ventricular systolic  pressure is 43.1 mmHg.  3. Left atrial size was moderately dilated.  4. A small pericardial effusion is present.  5. The mitral valve is normal in structure. Moderate mitral valve  regurgitation. No evidence of mitral stenosis.  6. Tricuspid valve regurgitation is moderate to severe.  7. The aortic valve is tricuspid. There is mild  calcification of the  aortic valve. Aortic valve regurgitation is not visualized. No aortic  stenosis is present.  8. Moderately dilated pulmonary artery.  9. The inferior vena cava is dilated in size with <50% respiratory  variability, suggesting right atrial pressure of 15 mmHg.   Laboratory Data:  High Sensitivity Troponin:  No results for input(s): TROPONINIHS in the last 720 hours.   Chemistry Recent Labs  Lab 12/23/2019 2012 01/08/20 0314 01/09/20 0857  NA 132* 132* 131*  K 4.0 4.2 4.8  CL 100 101 97*  CO2 18* 18* 20*  GLUCOSE 75 92 92  BUN 28* 31* 36*  CREATININE 1.34* 1.34* 1.91*  CALCIUM 8.2* 8.4* 8.3*  GFRNONAA >60 >60 41*  ANIONGAP 14 13 14     Recent Labs  Lab 01/14/2020 2012 01/08/20 0314 01/09/20 0857  PROT 5.5* 5.3* 5.2*  ALBUMIN 2.7* 2.6* 2.6*  AST 168* 184* 115*  ALT 126* 119* 103*  ALKPHOS 448* 425* 434*  BILITOT 3.7* 3.7* 5.7*   Hematology Recent Labs  Lab 12/21/2019 2012 01/08/20 0314 01/09/20 0857  WBC 6.5 6.1 7.8  RBC 3.29* 3.17* 3.30*  HGB 11.8* 11.2* 11.8*  HCT 33.6* 32.4* 34.3*  MCV 102.1* 102.2* 103.9*  MCH 35.9* 35.3* 35.8*  MCHC 35.1 34.6 34.4  RDW 15.8* 15.8* 15.9*  PLT 165 149* 155   BNPNo results for input(s): BNP, PROBNP in the last 168 hours.  DDimer No results for input(s): DDIMER in the last 168 hours.   Radiology/Studies:  ECHOCARDIOGRAM COMPLETE  Result Date: 01/08/2020    ECHOCARDIOGRAM REPORT   Patient Name:   Malaky E Underwood Date of Exam: 01/08/2020 Medical Rec #:  [id number]     Height:       65.0 in Accession #:    [id number]    Weight:       123.9 lb Date of Birth:  Jul 10, 1965    BSA:          1.614 m Patient Age:    45 years      BP:           118/95 mmHg Patient Gender: M             HR:           103 bpm. Exam Location:  Inpatient Procedure: 2D Echo Indications:    786.09 dyspnea  History:        Patient has prior history of Echocardiogram examinations, most                 recent 07/28/2011. Risk  Factors:Hypertension, Dyslipidemia and                 Current Smoker. ETOH Abuse.  Sonographer:    Jannett Celestine RDCS (AE) Referring Phys: [id number] Raynaldo Opitz RAINES IMPRESSIONS  1. Left ventricular ejection fraction, by estimation, is 30 to 35%. The left ventricle has moderately decreased function. The left ventricle demonstrates global hypokinesis. Left ventricular diastolic parameters were normal.  2. Right ventricular systolic function is moderately reduced. The right ventricular size is severely enlarged. There is mildly elevated pulmonary artery systolic pressure. The estimated right ventricular systolic pressure is 42.5 mmHg.  3. Left atrial size was moderately dilated.  4. A small pericardial effusion is present.  5. The mitral valve is normal in structure. Moderate mitral valve regurgitation. No evidence of mitral stenosis.  6. Tricuspid valve regurgitation is moderate to severe.  7. The aortic valve is tricuspid. There is mild calcification of the aortic valve. Aortic valve regurgitation is not visualized. No aortic stenosis is present.  8. Moderately dilated pulmonary artery.  9. The inferior vena cava is dilated in size with <50% respiratory variability, suggesting right atrial pressure of 15 mmHg. Comparison(s): Changes from prior study are noted. Conclusion(s)/Recommendation(s): EF now reduced to 30-35%. RV incompletely visualized but appears dilated, with moderate to severe TR. Also with moderate MR. Some ascites seen as well, and IVC dilated. Findings communicated with Dr. Rebeca Alert at 5:47 PM. FINDINGS  Left Ventricle: Left ventricular ejection fraction, by estimation, is 30 to 35%. The left ventricle has moderately decreased function. The left ventricle demonstrates global hypokinesis. The left ventricular internal cavity size was small. There is no left ventricular hypertrophy. Left ventricular diastolic parameters were normal. Right Ventricle: The right ventricular size is severely enlarged. Right  vetricular wall thickness was not well visualized. Right ventricular systolic function is moderately reduced. There is mildly elevated pulmonary artery systolic pressure. The tricuspid regurgitant velocity is 2.63 m/s, and with an assumed right atrial pressure of 15 mmHg, the estimated right ventricular systolic pressure is 95.6 mmHg. Left Atrium: Left atrial size was moderately dilated. Right Atrium: Right atrial size was not well visualized. Pericardium: A small pericardial effusion is present. Mitral Valve: The mitral valve is normal in structure. There is mild thickening of the mitral valve leaflet(s). Moderate mitral valve regurgitation. No evidence of mitral valve stenosis. Tricuspid Valve: The tricuspid valve is normal in structure. Tricuspid valve regurgitation is moderate to severe. Aortic Valve: The aortic valve is tricuspid. There is mild calcification of the aortic valve. Aortic valve regurgitation is not visualized. No aortic stenosis is present. Pulmonic Valve: The pulmonic valve was grossly normal. Pulmonic valve regurgitation is not visualized. No evidence of pulmonic stenosis. Aorta: The aortic root and ascending aorta are structurally normal, with no evidence of dilitation. Pulmonary Artery: The pulmonary  artery is moderately dilated. Venous: The inferior vena cava is dilated in size with less than 50% respiratory variability, suggesting right atrial pressure of 15 mmHg. IAS/Shunts: The atrial septum is grossly normal. Additional Comments: Moderate ascites is present.  LEFT VENTRICLE PLAX 2D LVIDd:         4.30 cm  Diastology LVIDs:         3.40 cm  LV e' medial:    9.03 cm/s LV PW:         1.00 cm  LV E/e' medial:  11.8 LV IVS:        1.10 cm  LV e' lateral:   9.57 cm/s LVOT diam:     2.00 cm  LV E/e' lateral: 11.2 LV SV:         17 LV SV Index:   11 LVOT Area:     3.14 cm  LEFT ATRIUM           Index LA diam:      3.70 cm 2.29 cm/m LA Vol (A2C): 73.9 ml 45.79 ml/m  AORTIC VALVE LVOT Vmax:    52.20 cm/s LVOT Vmean:  37.600 cm/s LVOT VTI:    0.055 m  AORTA Ao Root diam: 2.60 cm MITRAL VALVE                 TRICUSPID VALVE MV Area (PHT): 5.02 cm      TR Peak grad:   27.7 mmHg MV Decel Time: 151 msec      TR Vmax:        263.00 cm/s MR Peak grad:    101.2 mmHg MR Mean grad:    57.0 mmHg   SHUNTS MR Vmax:         503.00 cm/s Systemic VTI:  0.06 m MR Vmean:        335.0 cm/s  Systemic Diam: 2.00 cm MR PISA:         4.02 cm MR PISA Eff ROA: 27 mm MR PISA Radius:  0.80 cm MV E velocity: 107.00 cm/s Buford Dresser MD Electronically signed by Buford Dresser MD Signature Date/Time: 01/08/2020/5:47:16 PM    Final    US Abdomen Limited RUQ (LIVER/GB)  Result Date: 01/08/2020 CLINICAL DATA:  Hepatic cirrhosis EXAM: ULTRASOUND ABDOMEN LIMITED RIGHT UPPER QUADRANT COMPARISON:  CT abdomen and pelvis January 06, 2019. FINDINGS: Gallbladder: Within the gallbladder, there are echogenic foci which move and shadow consistent with cholelithiasis. Largest gallstone measures 5 mm in length. The gallbladder wall is thickened and edematous with a degree of pericholecystic fluid. No sonographic Murphy sign noted by sonographer. Common bile duct: Diameter: 7 mm, upper normal. No intrahepatic or extrahepatic biliary duct dilatation. Liver: No focal lesion identified. Liver contour is nodular with diffuse increase in liver echogenicity. Portal vein is patent on color Doppler imaging with normal direction of blood flow towards the liver. Other: There is a cyst in the lower pole of the right kidney measuring 1.1 x 0.7 x 1.1 cm. IMPRESSION: 1. Cholelithiasis with edematous, thickened gallbladder wall and pericholecystic fluid. Concern for acute cholecystitis given this appearance. 2. Nodular liver contour with diffuse increase in liver echogenicity, findings concerning for hepatic cirrhosis. No focal liver lesions evident. It must be cautioned that the sensitivity of ultrasound for detection of focal liver lesions  is diminished in this circumstance. 3.  Small cyst lower pole right kidney. These results will be called to the ordering clinician or representative by the Radiologist Assistant, and communication documented in the PACS  or [employer]. Electronically Signed   By: Lowella Grip III M.D.   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 - would eventually benefit from right and left heart cath once more stable and renal function improved, can be completed outpatient  - denies chest pain, dyspnea, orthopnea - will order compression stockings   Cirrhosis - will make fluid balance complicated - elevated LFTs - on high dose spiro, recommend continuign IV lasix as renal function will tolerate - per GI   Acute on chronic renal insufficieny  - sCr 1.91 (1.34) - daily BMP - may need renal consult to help guide diuresis   Hypertension - D/C cardizem - is on high dose spironolactone - will titrate medications for GDMT for CHF - start 25 mg toprol - if he is unable to tolerate ARB/ACEI/ARNI due to renal function, would recommend hydralazine/imdur vs bidil if pressure control is needed.   Hyperlipidemia - collect fasting lipids - continue 20 mg lipitor and fenofibrate   Alcohol use - CIWA per primary   ?Cholecystitis  - GI and surgery on board - will likely need to be more euvolemic prior to surgery, if surgical intervention is needed   Anemia ?of chronic  disease - Hb 11.2 (12.0 in July 2021)       New York Heart Association (NYHA) Functional Class NYHA Class I        For questions or updates, please contact [place] Please consult [email or web address] for contact info under    Signed, Ledora Bottcher, PA  01/09/2020 10:24 AM  Personally seen and examined. Agree with above.   54 year old male with alcohol induced cirrhosis, right upper quadrant pain, ejection fraction reduced 30%, severe TR.  Lower extremity edema has worsened.  Surgery was initially consulted for complaint of right upper quadrant pain.  They do believe that the majority of constellation of symptoms is secondary to his underlying liver disease and not gallbladder disease.  Echocardiogram reviewed from 01/08/2020 shows EF of 30% with moderately reduced right ventricular systolic function and severely enlarged right ventricle indicative of RV failure.  Pulmonary pressures were estimated at 43 mmHg.  IVC was dilated.  Moderate mitral regurgitation was also noted.  Creatinine has increased from 1.34-1.91.  His ALT is slightly decreased from 126 down to 103.  Hemoglobin is 11.8.  Albumin is 2.6.  On exam, frail-appearing thin, protuberant abdomen, soft 3-4+ edema lower extremities, decreased breath sounds at bases, regular rate and rhythm with soft systolic murmur appreciated.  Positive midneck JVD.  Assessment and plan:  54 year old with underlying alcoholic cirrhosis, newly discovered ejection fraction of 30% with significant lower extremity edema, right upper quadrant discomfort and acute kidney injury.  -Agree with continued diuresis.  100 mg of spironolactone for p.o. diuresis has been added in the setting of his cirrhosis.  He has received both a 20 mg and a 40 mg IV Lasix  dose.  Continue. -RV volume overload/failure also noted.  This is leading to worsening hepatic congestion as well as worsening lower extremity edema as well.  Continue with current diuretic  plan. -Low albumin also contributing to third spacing.  This is also a poor prognostic indicator in the setting of his liver failure/heart failure. -Agree with stopping the Cardizem in the setting of reduced systolic function and starting low-dose Toprol. -Unable to utilize ACE ARB or ARNI at this point  We will continue to follow.  Candee Furbish, MD

## 2020-01-10 ENCOUNTER — Inpatient Hospital Stay (HOSPITAL_COMMUNITY): Payer: Medicaid Other | Admitting: Certified Registered Nurse Anesthetist

## 2020-01-10 ENCOUNTER — Inpatient Hospital Stay (HOSPITAL_COMMUNITY): Payer: Medicaid Other

## 2020-01-10 DIAGNOSIS — I5082 Biventricular heart failure: Secondary | ICD-10-CM

## 2020-01-10 DIAGNOSIS — N179 Acute kidney failure, unspecified: Secondary | ICD-10-CM

## 2020-01-10 DIAGNOSIS — G40909 Epilepsy, unspecified, not intractable, without status epilepticus: Secondary | ICD-10-CM

## 2020-01-10 DIAGNOSIS — I469 Cardiac arrest, cause unspecified: Secondary | ICD-10-CM

## 2020-01-10 LAB — GLUCOSE, CAPILLARY
Glucose-Capillary: 127 mg/dL — ABNORMAL HIGH (ref 70–99)
Glucose-Capillary: 127 mg/dL — ABNORMAL HIGH (ref 70–99)
Glucose-Capillary: 131 mg/dL — ABNORMAL HIGH (ref 70–99)
Glucose-Capillary: 159 mg/dL — ABNORMAL HIGH (ref 70–99)
Glucose-Capillary: 192 mg/dL — ABNORMAL HIGH (ref 70–99)
Glucose-Capillary: 27 mg/dL — CL (ref 70–99)
Glucose-Capillary: 95 mg/dL (ref 70–99)

## 2020-01-10 LAB — CBC
HCT: 38.9 % — ABNORMAL LOW (ref 39.0–52.0)
HCT: 35.7 % — ABNORMAL LOW (ref 39.0–52.0)
HCT: 36.1 % — ABNORMAL LOW (ref 39.0–52.0)
Hemoglobin: 12.8 g/dL — ABNORMAL LOW (ref 13.0–17.0)
Hemoglobin: 11.6 g/dL — ABNORMAL LOW (ref 13.0–17.0)
Hemoglobin: 11.9 g/dL — ABNORMAL LOW (ref 13.0–17.0)
MCH: 35.8 pg — ABNORMAL HIGH (ref 26.0–34.0)
MCH: 35.2 pg — ABNORMAL HIGH (ref 26.0–34.0)
MCH: 36.7 pg — ABNORMAL HIGH (ref 26.0–34.0)
MCHC: 32.9 g/dL (ref 30.0–36.0)
MCHC: 32.1 g/dL (ref 30.0–36.0)
MCHC: 33.3 g/dL (ref 30.0–36.0)
MCV: 108.7 fL — ABNORMAL HIGH (ref 80.0–100.0)
MCV: 109.4 fL — ABNORMAL HIGH (ref 80.0–100.0)
MCV: 110.2 fL — ABNORMAL HIGH (ref 80.0–100.0)
Platelets: 155 10*3/uL (ref 150–400)
Platelets: 147 10*3/uL — ABNORMAL LOW (ref 150–400)
Platelets: 154 10*3/uL (ref 150–400)
RBC: 3.58 MIL/uL — ABNORMAL LOW (ref 4.22–5.81)
RBC: 3.24 MIL/uL — ABNORMAL LOW (ref 4.22–5.81)
RBC: 3.3 MIL/uL — ABNORMAL LOW (ref 4.22–5.81)
RDW: 16.4 % — ABNORMAL HIGH (ref 11.5–15.5)
RDW: 16.4 % — ABNORMAL HIGH (ref 11.5–15.5)
RDW: 16.4 % — ABNORMAL HIGH (ref 11.5–15.5)
WBC: 13.5 10*3/uL — ABNORMAL HIGH (ref 4.0–10.5)
WBC: 10.1 10*3/uL (ref 4.0–10.5)
WBC: 10.6 10*3/uL — ABNORMAL HIGH (ref 4.0–10.5)
nRBC: 1.3 % — ABNORMAL HIGH (ref 0.0–0.2)
nRBC: 1.3 % — ABNORMAL HIGH (ref 0.0–0.2)
nRBC: 1.3 % — ABNORMAL HIGH (ref 0.0–0.2)

## 2020-01-10 LAB — PROTIME-INR
INR: 2.1 — ABNORMAL HIGH (ref 0.8–1.2)
Prothrombin Time: 22.5 seconds — ABNORMAL HIGH (ref 11.4–15.2)

## 2020-01-10 LAB — POCT I-STAT 7, (LYTES, BLD GAS, ICA,H+H)
Acid-base deficit: 13 mmol/L — ABNORMAL HIGH (ref 0.0–2.0)
Bicarbonate: 12.9 mmol/L — ABNORMAL LOW (ref 20.0–28.0)
Calcium, Ion: 1.04 mmol/L — ABNORMAL LOW (ref 1.15–1.40)
HCT: 38 % — ABNORMAL LOW (ref 39.0–52.0)
Hemoglobin: 12.9 g/dL — ABNORMAL LOW (ref 13.0–17.0)
O2 Saturation: 100 %
Patient temperature: 96.9
Potassium: 4.1 mmol/L (ref 3.5–5.1)
Sodium: 131 mmol/L — ABNORMAL LOW (ref 135–145)
TCO2: 14 mmol/L — ABNORMAL LOW (ref 22–32)
pCO2 arterial: 28.2 mmHg — ABNORMAL LOW (ref 32.0–48.0)
pH, Arterial: 7.264 — ABNORMAL LOW (ref 7.350–7.450)
pO2, Arterial: 251 mmHg — ABNORMAL HIGH (ref 83.0–108.0)

## 2020-01-10 LAB — COMPREHENSIVE METABOLIC PANEL
ALT: 149 U/L — ABNORMAL HIGH (ref 0–44)
AST: 420 U/L — ABNORMAL HIGH (ref 15–41)
Albumin: 2.4 g/dL — ABNORMAL LOW (ref 3.5–5.0)
Alkaline Phosphatase: 407 U/L — ABNORMAL HIGH (ref 38–126)
Anion gap: 25 — ABNORMAL HIGH (ref 5–15)
BUN: 48 mg/dL — ABNORMAL HIGH (ref 6–20)
CO2: 12 mmol/L — ABNORMAL LOW (ref 22–32)
Calcium: 7.8 mg/dL — ABNORMAL LOW (ref 8.9–10.3)
Chloride: 96 mmol/L — ABNORMAL LOW (ref 98–111)
Creatinine, Ser: 2.75 mg/dL — ABNORMAL HIGH (ref 0.61–1.24)
GFR, Estimated: 27 mL/min — ABNORMAL LOW (ref >60)
Glucose, Bld: 131 mg/dL — ABNORMAL HIGH (ref 70–99)
Potassium: 4.2 mmol/L (ref 3.5–5.1)
Sodium: 133 mmol/L — ABNORMAL LOW (ref 135–145)
Total Bilirubin: 6.7 mg/dL — ABNORMAL HIGH (ref 0.3–1.2)
Total Protein: 5.2 g/dL — ABNORMAL LOW (ref 6.5–8.1)

## 2020-01-10 LAB — PHOSPHORUS
Phosphorus: 9.5 mg/dL — ABNORMAL HIGH (ref 2.5–4.6)
Phosphorus: 7.5 mg/dL — ABNORMAL HIGH (ref 2.5–4.6)

## 2020-01-10 LAB — TROPONIN I (HIGH SENSITIVITY): Troponin I (High Sensitivity): 68 ng/L — ABNORMAL HIGH (ref <18)

## 2020-01-10 LAB — AMMONIA: Ammonia: 123 umol/L — ABNORMAL HIGH (ref 9–35)

## 2020-01-10 LAB — LACTIC ACID, PLASMA
Lactic Acid, Venous: 10.3 mmol/L (ref 0.5–1.9)
Lactic Acid, Venous: 6.6 mmol/L (ref 0.5–1.9)

## 2020-01-10 LAB — APTT: aPTT: 34 seconds (ref 24–36)

## 2020-01-10 LAB — IGG: IgG (Immunoglobin G), Serum: 733 mg/dL (ref 603–1613)

## 2020-01-10 LAB — MITOCHONDRIAL ANTIBODIES: Mitochondrial M2 Ab, IgG: <20 Units (ref 0.0–20.0)

## 2020-01-10 LAB — ANTI-SMOOTH MUSCLE ANTIBODY, IGG: F-Actin IgG: 6 Units (ref 0–19)

## 2020-01-10 LAB — MAGNESIUM
Magnesium: 1.7 mg/dL (ref 1.7–2.4)
Magnesium: 1.3 mg/dL — ABNORMAL LOW (ref 1.7–2.4)

## 2020-01-10 MED ORDER — THIAMINE HCL 100 MG PO TABS
100.0000 mg | ORAL_TABLET | Freq: Every day | ORAL | Status: DC
  Administered 2020-01-10: 100 mg
  Filled 2020-01-10 (×2): qty 1

## 2020-01-10 MED ORDER — VANCOMYCIN HCL IN DEXTROSE 1-5 GM/200ML-% IV SOLN
1000.0000 mg | INTRAVENOUS | Status: AC
  Administered 2020-01-10: 1000 mg via INTRAVENOUS
  Filled 2020-01-10: qty 200

## 2020-01-10 MED ORDER — LORAZEPAM 2 MG/ML IJ SOLN: 1.0000 mg | INTRAMUSCULAR | Status: AC | PRN

## 2020-01-10 MED ORDER — DEXMEDETOMIDINE HCL IN NACL 400 MCG/100ML IV SOLN
0.0000 ug/kg/h | INTRAVENOUS | Status: DC
  Administered 2020-01-10 (×2): 0.4 ug/kg/h via INTRAVENOUS
  Filled 2020-01-10 (×2): qty 100

## 2020-01-10 MED ORDER — LORAZEPAM 1 MG PO TABS: 1.0000 mg | ORAL_TABLET | ORAL | Status: AC | PRN

## 2020-01-10 MED ORDER — ROCURONIUM BROMIDE 10 MG/ML (PF) SYRINGE
PREFILLED_SYRINGE | INTRAVENOUS | Status: AC
  Filled 2020-01-10: qty 10

## 2020-01-10 MED ORDER — ROCURONIUM BROMIDE 50 MG/5ML IV SOLN
50.0000 mg | Freq: Once | INTRAVENOUS | Status: AC
  Administered 2020-01-10: 50 mg via INTRAVENOUS
  Filled 2020-01-10: qty 5

## 2020-01-10 MED ORDER — VITAL AF 1.2 CAL PO LIQD
1000.0000 mL | ORAL | Status: DC
  Administered 2020-01-10 – 2020-01-11 (×2): 1000 mL

## 2020-01-10 MED ORDER — ATORVASTATIN CALCIUM 10 MG PO TABS
20.0000 mg | ORAL_TABLET | Freq: Every day | ORAL | Status: DC
  Administered 2020-01-10 – 2020-01-11 (×2): 20 mg
  Filled 2020-01-10 (×2): qty 2

## 2020-01-10 MED ORDER — SPIRONOLACTONE 25 MG PO TABS: 50.0000 mg | ORAL_TABLET | Freq: Every day | ORAL | Status: DC

## 2020-01-10 MED ORDER — LEVETIRACETAM 100 MG/ML PO SOLN
500.0000 mg | Freq: Two times a day (BID) | ORAL | Status: DC
  Administered 2020-01-10 – 2020-01-11 (×3): 500 mg
  Filled 2020-01-10 (×4): qty 5

## 2020-01-10 MED ORDER — ORAL CARE MOUTH RINSE
15.0000 mL | OROMUCOSAL | Status: DC
  Administered 2020-01-10 – 2020-01-11 (×13): 15 mL via OROMUCOSAL

## 2020-01-10 MED ORDER — FENTANYL CITRATE (PF) 100 MCG/2ML IJ SOLN
50.0000 ug | Freq: Once | INTRAMUSCULAR | Status: AC
  Administered 2020-01-10: 50 ug via INTRAVENOUS

## 2020-01-10 MED ORDER — LACTULOSE 10 GM/15ML PO SOLN
30.0000 g | Freq: Three times a day (TID) | ORAL | Status: DC
  Administered 2020-01-10: 30 g via ORAL
  Filled 2020-01-10: qty 45

## 2020-01-10 MED ORDER — DOCUSATE SODIUM 50 MG/5ML PO LIQD
100.0000 mg | Freq: Two times a day (BID) | ORAL | Status: DC
  Administered 2020-01-10 – 2020-01-11 (×3): 100 mg
  Filled 2020-01-10 (×3): qty 10

## 2020-01-10 MED ORDER — NOREPINEPHRINE 16 MG/250ML-% IV SOLN
0.0000 ug/min | INTRAVENOUS | Status: DC
  Administered 2020-01-10: 4 ug/min via INTRAVENOUS
  Filled 2020-01-10 (×2): qty 250

## 2020-01-10 MED ORDER — ALBUMIN HUMAN 25 % IV SOLN
25.0000 g | Freq: Four times a day (QID) | INTRAVENOUS | Status: DC
  Administered 2020-01-10 – 2020-01-11 (×4): 25 g via INTRAVENOUS
  Filled 2020-01-10 (×4): qty 100

## 2020-01-10 MED ORDER — ZINC SULFATE 220 (50 ZN) MG PO CAPS
220.0000 mg | ORAL_CAPSULE | Freq: Every day | ORAL | Status: DC
  Administered 2020-01-11: 220 mg
  Filled 2020-01-10: qty 1

## 2020-01-10 MED ORDER — ZINC SULFATE 220 (50 ZN) MG PO CAPS
220.0000 mg | ORAL_CAPSULE | Freq: Every day | ORAL | Status: DC
  Administered 2020-01-10: 220 mg via ORAL
  Filled 2020-01-10: qty 1

## 2020-01-10 MED ORDER — PROSOURCE TF PO LIQD
45.0000 mL | Freq: Two times a day (BID) | ORAL | Status: DC
  Administered 2020-01-10 – 2020-01-11 (×3): 45 mL
  Filled 2020-01-10 (×3): qty 45

## 2020-01-10 MED ORDER — THIAMINE HCL 100 MG/ML IJ SOLN
500.0000 mg | INTRAVENOUS | Status: DC
  Administered 2020-01-10 – 2020-01-11 (×2): 500 mg via INTRAVENOUS
  Filled 2020-01-10: qty 5
  Filled 2020-01-10: qty 2

## 2020-01-10 MED ORDER — THIAMINE HCL 100 MG/ML IJ SOLN: 100.0000 mg | Freq: Every day | INTRAMUSCULAR | Status: DC

## 2020-01-10 MED ORDER — PHENYTOIN 125 MG/5ML PO SUSP
100.0000 mg | Freq: Three times a day (TID) | ORAL | Status: DC
  Administered 2020-01-10 – 2020-01-11 (×4): 100 mg
  Filled 2020-01-10 (×4): qty 4

## 2020-01-10 MED ORDER — CHLORHEXIDINE GLUCONATE CLOTH 2 % EX PADS
6.0000 | MEDICATED_PAD | Freq: Every day | CUTANEOUS | Status: DC
  Administered 2020-01-10: 6 via TOPICAL

## 2020-01-10 MED ORDER — CHLORHEXIDINE GLUCONATE 0.12% ORAL RINSE (MEDLINE KIT)
15.0000 mL | Freq: Two times a day (BID) | OROMUCOSAL | Status: DC
  Administered 2020-01-10 – 2020-01-11 (×3): 15 mL via OROMUCOSAL

## 2020-01-10 MED ORDER — FENOFIBRATE 160 MG PO TABS
160.0000 mg | ORAL_TABLET | Freq: Every day | ORAL | Status: DC
  Administered 2020-01-10 – 2020-01-11 (×2): 160 mg
  Filled 2020-01-10 (×2): qty 1

## 2020-01-10 MED ORDER — POLYETHYLENE GLYCOL 3350 17 G PO PACK
17.0000 g | PACK | Freq: Every day | ORAL | Status: DC
  Administered 2020-01-10 – 2020-01-11 (×2): 17 g
  Filled 2020-01-10 (×2): qty 1

## 2020-01-10 MED ORDER — SPIRONOLACTONE 25 MG PO TABS
100.0000 mg | ORAL_TABLET | Freq: Every day | ORAL | Status: DC
  Administered 2020-01-10: 100 mg
  Filled 2020-01-10: qty 4

## 2020-01-10 MED ORDER — VANCOMYCIN HCL 500 MG/100ML IV SOLN
500.0000 mg | INTRAVENOUS | Status: DC
  Filled 2020-01-10: qty 100

## 2020-01-10 MED ORDER — SODIUM CHLORIDE 0.9 % IV SOLN: 2.0000 g | INTRAVENOUS | Status: DC

## 2020-01-10 MED ORDER — SODIUM CHLORIDE 0.9 % IV SOLN
2.0000 g | INTRAVENOUS | Status: AC
  Administered 2020-01-10: 2 g via INTRAVENOUS
  Filled 2020-01-10: qty 2

## 2020-01-10 MED ORDER — OXYCODONE HCL 5 MG PO TABS: 5.0000 mg | ORAL_TABLET | ORAL | Status: DC | PRN

## 2020-01-10 MED ORDER — FENTANYL BOLUS VIA INFUSION
50.0000 ug | INTRAVENOUS | Status: DC | PRN
  Administered 2020-01-10: 50 ug via INTRAVENOUS
  Filled 2020-01-10: qty 50

## 2020-01-10 MED ORDER — VASOPRESSIN 20 UNITS/100 ML INFUSION FOR SHOCK: 0.0000 [IU]/min | INTRAVENOUS | Status: DC

## 2020-01-10 MED ORDER — VITAMIN K1 10 MG/ML IJ SOLN
5.0000 mg | Freq: Every day | INTRAVENOUS | Status: DC
  Administered 2020-01-10 – 2020-01-11 (×2): 5 mg via INTRAVENOUS
  Filled 2020-01-10 (×2): qty 0.5

## 2020-01-10 MED ORDER — FENTANYL 2500MCG IN NS 250ML (10MCG/ML) PREMIX INFUSION
50.0000 ug/h | INTRAVENOUS | Status: DC
  Administered 2020-01-10: 50 ug/h via INTRAVENOUS
  Filled 2020-01-10 (×2): qty 250

## 2020-01-10 MED ORDER — LACTULOSE 10 GM/15ML PO SOLN
30.0000 g | Freq: Three times a day (TID) | ORAL | Status: DC
  Administered 2020-01-10 – 2020-01-11 (×2): 30 g
  Filled 2020-01-10 (×2): qty 45

## 2020-01-10 MED ORDER — ADULT MULTIVITAMIN LIQUID CH
15.0000 mL | Freq: Every day | ORAL | Status: DC
  Administered 2020-01-10 – 2020-01-11 (×2): 15 mL
  Filled 2020-01-10 (×2): qty 15

## 2020-01-10 MED ORDER — VITAL HIGH PROTEIN PO LIQD
1000.0000 mL | ORAL | Status: DC
  Administered 2020-01-10: 1000 mL

## 2020-01-10 MED ORDER — SODIUM BICARBONATE 8.4 % IV SOLN
50.0000 meq | Freq: Once | INTRAVENOUS | Status: AC
  Administered 2020-01-10: 50 meq via INTRAVENOUS
  Filled 2020-01-10: qty 50

## 2020-01-10 MED ORDER — PANTOPRAZOLE SODIUM 40 MG PO PACK
40.0000 mg | PACK | Freq: Every day | ORAL | Status: DC
  Administered 2020-01-10 – 2020-01-11 (×2): 40 mg
  Filled 2020-01-10 (×2): qty 20

## 2020-01-10 MED ORDER — FOLIC ACID 1 MG PO TABS
1.0000 mg | ORAL_TABLET | Freq: Every day | ORAL | Status: DC
  Administered 2020-01-10 – 2020-01-11 (×2): 1 mg
  Filled 2020-01-10 (×3): qty 1

## 2020-01-10 NOTE — Progress Notes (Signed)
  Patient Name: Joseph Underwood   MRN: [id number]   Date of Birth/ Sex: Aug 12, 1965 , male      Admission Date: 01/14/2020  Attending Provider: Oda Kilts, MD  Primary Diagnosis: <principal problem not specified>   Indication: Pt was in his usual state of health until this AM, when he was noted to be unresponsive per floor RN. Code blue was subsequently called. At the time of arrival on scene, ACLS protocol was underway.   Technical Description:  - CPR performance duration:  15 minutes  - Was defibrillation or cardioversion used? No   - Was external pacer placed? Yes  - Was patient intubated pre/post CPR? Yes   Medications Administered: Y = Yes; Blank = No Amiodarone    Atropine    Calcium    Epinephrine  Y  Lidocaine    Magnesium    Norepinephrine    Phenylephrine    Sodium bicarbonate    Vasopressin     Post CPR evaluation:  - Final Status - Was patient successfully resuscitated ? Yes - What is current rhythm? Sinus rhythm - What is current hemodynamic status? Stable  Miscellaneous Information:  - Labs sent, including: CBC, BMP, lactic acid, VBG, CXR  - Primary team notified?  Primary team notified and promptly responded at bedside  - Family Notified? Attempted call x4 to mother, sister.   - Additional notes/ transfer status: Gaylyn Lambert, NP with PCCM at bedside. Patient to transfer to Cammy Brochure, MD  01/10/2020, 8:47 AM

## 2020-01-10 NOTE — Progress Notes (Signed)
Entered room, Camera operator and night Shift charge nurse in room  Compression had been started, code had been called, crash cart was in room, resident team was coming into room, labs and respiratory and others in hall way entering, Was told patient was found unresponsive and compression had been  started.  Patient was transferred, provided report prior to transfer and accompanied patient to 2m08.

## 2020-01-10 NOTE — Anesthesia Procedure Notes (Signed)
Procedure Name: Intubation Date/Time: 01/10/2020 7:58 AM Performed by: Janace Litten, CRNA Preoxygenation: Pre-oxygenation with 100% oxygen Ventilation: Mask ventilation without difficulty Laryngoscope Size: Glidescope and 3 Grade View: Grade I Tube type: Oral Tube size: 7.5 mm Number of attempts: 1 Airway Equipment and Method: Stylet and Video-laryngoscopy Placement Confirmation: ETT inserted through vocal cords under direct vision,  breath sounds checked- equal and bilateral and CO2 detector Secured at: 23 cm Tube secured with: Tape Dental Injury: Teeth and Oropharynx as per pre-operative assessment

## 2020-01-10 NOTE — Procedures (Signed)
Arterial Catheter Insertion Procedure Note  Joseph Underwood  [id number]  [birth year]  Date:01/10/20  Time:5:29 PM    Provider Performing: Johnsie Cancel    Procedure: Insertion of Arterial Line [phone]) with US guidance ([id number])   Indication(s) Blood pressure monitoring and/or need for frequent ABGs  Consent Unable to obtain consent due to emergent nature of procedure.  Anesthesia None   Time Out Verified patient identification, verified procedure, site/side was marked, verified correct patient position, special equipment/implants available, medications/allergies/relevant history reviewed, required imaging and test results available.   Sterile Technique Maximal sterile technique including full sterile barrier drape, hand hygiene, sterile gown, sterile gloves, mask, hair covering, sterile ultrasound probe cover (if used).   Procedure Description Area of catheter insertion was cleaned with chlorhexidine and draped in sterile fashion. With real-time ultrasound guidance an arterial catheter was placed into the left radial artery.  Appropriate arterial tracings confirmed on monitor.     Complications/Tolerance None; patient tolerated the procedure well.   EBL Minimal   Specimen(s) None  Johnsie Cancel, NP-C [facility] Contact / Pager information can be found on Amion  01/10/2020, 5:29 PM

## 2020-01-10 NOTE — Progress Notes (Signed)
Pharmacy Antibiotic Note  Joseph Underwood is a 54 y.o. male admitted on 12/26/2019 with leg swelling and dyspnea on exertion.  Patient suffered a cardiac arrest and then transferred to the ICU this AM.  Pharmacy has been consulted for vancomycin and cefepime dosing for sepsis.  AKI - SCr up 2.75, CrCL 24 ml/min, afebrile, WBC 10.6, LA 10.3.  Plan: Vanc 1gm IV x 1, then 500mg  IV Q24H for goal trough 15-20 mcg/mL.  May need to dose per level pend SCr. Cefepime 2gm IV Q24H Monitor renal fxn, clinical progress, vanc level as indicated   Height: 5\' 5"  (165.1 cm) Weight: 56.2 kg (123 lb 14.4 oz) IBW/kg (Calculated) : 61.5  Temp (24hrs), Avg:97.3 F (36.3 C), Min:92.4 F (33.6 C), Max:98.9 F (37.2 C)  Recent Labs  Lab 01/06/2020 2012 01/12/2020 2012 01/08/20 0314 01/09/20 0857 01/10/20 0220 01/10/20 1008 01/10/20 1009  WBC 6.5   < > 6.1 7.8 13.5* 10.1 10.6*  CREATININE 1.34*  --  1.34* 1.91*  --  2.75*  --   LATICACIDVEN  --   --   --   --   --  10.3*  --    < > = values in this interval not displayed.    Estimated Creatinine Clearance: 24.4 mL/min (A) (by C-G formula based on SCr of 2.75 mg/dL (H)).    No Known Allergies  Suhaan Perleberg D. Mina Marble, PharmD, BCPS, [place] 01/10/2020, 4:06 PM

## 2020-01-10 NOTE — Procedures (Addendum)
Central Venous Catheter Insertion Procedure Note Joseph Underwood [id number] [birth year]  Procedure: Insertion of Central Venous Catheter Indications: Assessment of intravascular volume, Drug and/or fluid administration and Frequent blood sampling  Procedure Details Consent: Unable to obtain consent because of emergent medical necessity. Time Out: Verified patient identification, verified procedure, site/side was marked, verified correct patient position, special equipment/implants available, medications/allergies/relevent history reviewed, required imaging and test results available.  Performed  Maximum sterile technique was used including antiseptics, cap, gloves, gown, hand hygiene, mask and sheet. Skin prep: Chlorhexidine; local anesthetic administered A antimicrobial bonded/coated triple lumen catheter was placed in the left internal jugular vein using the Seldinger technique. Ultrasound guidance used.Yes.   Catheter placed to 20 cm. Blood aspirated via all 3 ports and then flushed x 3. Line sutured x 2 and dressing applied.  Evaluation Blood flow good Complications: No apparent complications Patient did tolerate procedure well. Chest X-ray ordered to verify placement.  CXR: pending.  Joseph Underwood ACNP Acute Care Nurse Practitioner [place] Please consult Amion 01/10/2020, 9:20 AM

## 2020-01-10 NOTE — Progress Notes (Signed)
Chaplain responded to Code Blue. Pt not available; no family is present.  Please contact if support is needed.  Luana Shu [phone]   01/10/20 0800  Clinical Encounter Type  Visited With Patient not available  Visit Type Code  Referral From Care management  Consult/Referral To Chaplain  Stress Factors  Patient Stress Factors Health changes

## 2020-01-10 NOTE — Progress Notes (Signed)
Subjective:   Overnight, patient NPO starting at midnight for HIDA scan.  This morning, patient suffered PEA arrest in the setting of hypoglycemia (27), intubated, and achieved RoSC following epinephrine, bicarbonate and amp of D50 administration. Patient was transferred to ICU.  Objective:  Vital signs in last 24 hours: Vitals:   01/10/20 1100 01/10/20 1115 01/10/20 1130 01/10/20 1141  BP: 112/90 107/90 105/89   Pulse: 71  (!) 192   Resp: 18 18 18    Temp:    (!) 92.4 F (33.6 C)  TempSrc:    Rectal  SpO2: 100%  100%   Weight:      Height:      SpO2: 100 % O2 Flow Rate (L/min): 2 L/min FiO2 (%): (S) 60 % (post ABG with results given to the RN)  Intake/Output Summary (Last 24 hours) at 01/10/2020 1144 Last data filed at 01/10/2020 1100 Gross per 24 hour  Intake 409.88 ml  Output 0 ml  Net 409.88 ml   Filed Weights   01/13/2020 1800  Weight: 56.2 kg  Physical Exam Constitutional:      Appearance: He is ill-appearing.  Cardiovascular:     Rate and Rhythm: Normal rate and regular rhythm.  Pulmonary:     Comments: Intubated Abdominal:     General: Bowel sounds are normal. There is distension.     Palpations: Abdomen is soft.  Musculoskeletal:     Right lower leg: Edema present.     Left lower leg: Edema present.     Comments: 2+ pitting edema of bilateral lower extremities    CBC Latest Ref Rng & Units 01/10/2020 01/10/2020 01/10/2020  WBC 4.0 - 10.5 K/uL - 10.6(H) 10.1  Hemoglobin 13.0 - 17.0 g/dL 12.9(L) 11.6(L) 11.9(L)  Hematocrit 39 - 52 % 38.0(L) 36.1(L) 35.7(L)  Platelets 150 - 400 K/uL - 147(L) 154   CMP Latest Ref Rng & Units 01/10/2020 01/10/2020 01/09/2020  Glucose 70 - 99 mg/dL - 131(H) 92  BUN 6 - 20 mg/dL - 48(H) 36(H)  Creatinine 0.61 - 1.24 mg/dL - 2.75(H) 1.91(H)  Sodium 135 - 145 mmol/L 131(L) 133(L) 131(L)  Potassium 3.5 - 5.1 mmol/L 4.1 4.2 4.8  Chloride 98 - 111 mmol/L - 96(L) 97(L)  CO2 22 - 32 mmol/L - 12(L) 20(L)  Calcium 8.9 - 10.3  mg/dL - 7.8(L) 8.3(L)  Total Protein 6.5 - 8.1 g/dL - 5.2(L) 5.2(L)  Total Bilirubin 0.3 - 1.2 mg/dL - 6.7(H) 5.7(H)  Alkaline Phos 38 - 126 U/L - 407(H) 434(H)  AST 15 - 41 U/L - 420(H) 115(H)  ALT 0 - 44 U/L - 149(H) 103(H)   Hepatitis A antibody - in process Anti-smooth muscle antibody, IgG - in process Mitochondrial antibodies - in process ANA, IFA (with reflex) - in process IgG - in process  IMAGING: DG CHEST PORT 1 VIEW  Result Date: 01/10/2020 CLINICAL DATA:  Central line and enteric tube placement. EXAM: PORTABLE CHEST 1 VIEW COMPARISON:  Chest x-ray dated March 20, 2018. FINDINGS: Endotracheal tube tip 4 mm above the carina. Enteric tube tip entering the stomach with the tip in the fundus. Left internal jugular central venous catheter with tip at the cavoatrial junction. Cardiomegaly. Diffuse interstitial thickening with bilateral perihilar hazy opacities. No pleural effusion or pneumothorax. No acute osseous abnormality. IMPRESSION: 1. Endotracheal tube tip 4 mm above the carina. Recommend retraction 1-2 cm. 2. Appropriately positioned left internal jugular central venous catheter and enteric tube. 3. Mild pulmonary edema. Electronically Signed   By:  Titus Dubin M.D.   On: 01/10/2020 09:23    Assessment/Plan:  Active Problems:   Alcohol use disorder, moderate, dependence (HCC)   Cirrhosis (HCC)   Epilepsy ([place])   Volume overload   Lower extremity edema   Macrocytic anemia  [name] is a 54 year old male with past medical history significant for alcohol induced liver cirrhosis who presented to the [place] clinic on 11/24 for evaluation of a 2-week history of lower extremity edema and dyspnea.  #PEA arrest Patient experienced PEA arrest in the setting of significant hypoglycemia (27). Patient had been tolerating oral intake well and endorsed strong appetite throughout hospitalization. Patient had been made NPO pending HIDA scan today. Significant  hypoglycemia likely secondary to impaired glucagon production in the setting of his cirrhosis. Patient has been transferred to the ICU for further management at this time.  #Biventricular heart failure, active Patient's echocardiogram revealing LVEF of 30-35% with global hypokinesis and moderately reduced right ventricular systolic function with severe dilation. Patient initially managed with 20 then 40 of IV lasix with no recorded urine output and transitioned to 40mg  IV lasix daily with continuation of spironolactone 100mg  daily. Patient was transitioned off of his home diltiazem in favor of starting metoprolol 25mg  daily per cardiology recommendations.   #Cirrhosis, chronic Likely secondary to significant alcohol use, however patient may have concomitant liver disease such as autoimmune hepatitis. Negative hepatitis B/C. MELD (19) and child pugh score B conveying 30% risk with intra-abdominal surgery. Transaminitis relatively stable, however hyperbilirubinemia worsening overnight from 3.7 to 5.7 to 6.7. GI consulted and recommended autoimmune laboratory workup as well as outpatient endoscopy/colonoscopy and hepatitis B vaccination series.  #Concern for acute cholecystitis, active Patient with right upper quadrant tenderness on prior physical examination now with leukocytosis and gradually uptrending hyperbilirubinemia (6.7, 5.7, 3.7 on admission) in the setting of RUQ US findings of cholelithiasis with edematous, thickened gallbladder wall and pericholecystic fluid concerning for acute cholecystitis. Radiologic findings may be secondary to liver failure rather than cholecystitis. General surgery and GI consulted with plan for HIDA as soon as possible.  #AKI, active Patient having gradual elevation of creatinine from 1.34 on admission to 2.75 following starting current diuretic regimen.  Cato Mulligan, MD 01/10/2020, 11:44 AM Pager: [phone] After 5pm on weekdays and 1pm on weekends: On  Call pager [phone]

## 2020-01-10 NOTE — Progress Notes (Signed)
Initial Nutrition Assessment  DOCUMENTATION CODES:   Non-severe (moderate) malnutrition in context of chronic illness  INTERVENTION:  Initiate Vital AF 1.2 formula at goal rate of 55 ml/h (1320 ml per day) and Prosource TF 45 ml BID to provide 1664 kcals, 121 gm protein, 1069 ml free water daily.  NUTRITION DIAGNOSIS:   Moderate Malnutrition related to chronic illness (cirrhosis) as evidenced by moderate fat depletion, moderate muscle depletion.  GOAL:   Patient will meet greater than or equal to 90% of their needs  MONITOR:   TF tolerance, Vent status, Weight trends, Labs, I & O's  REASON FOR ASSESSMENT:   Consult Enteral/tube feeding initiation and management  ASSESSMENT:   54 year old male history of ETOH abuse, cirrhosis presents with chief complaints of lower extremity edema and liver dysfunction along with increasing renal dysfunction. Pt aggressively diuresed. Morning of 01/10/2020 pt found in PEA arrest.  Patient is currently intubated on ventilator support MV: 8.5 L/min Temp (24hrs), Avg:97.3 F (36.3 C), Min:92.4 F (33.6 C), Max:98.9 F (37.2 C)  Pt intubated after PEA arrest. Pt with weeping lower extremity edema. RD consulted for enteral/tube feeding initiation and management. Tube feeding started this morning per tube feeding protocol and pt has been tolerating it well. Current tube feeding regimen of Vital high protein at rate of 40 ml/hr with Prosource TF BID to provide 1040 kcal (65% of kcal needs). RD to modify tube feeding order to meet 100% of estimated nutrition needs.   Propofol: none  NUTRITION - FOCUSED PHYSICAL EXAM:    Most Recent Value  Orbital Region Unable to assess  Upper Arm Region Moderate depletion  Thoracic and Lumbar Region No depletion  Buccal Region Unable to assess  Temple Region Moderate depletion  Clavicle Bone Region No depletion  Clavicle and Acromion Bone Region Unable to assess  Scapular Bone Region Unable to assess    Dorsal Hand Unable to assess  Patellar Region Moderate depletion  Anterior Thigh Region Moderate depletion  Posterior Calf Region No depletion  [edema]  Edema (RD Assessment) Severe  Hair Reviewed  Eyes Unable to assess  Mouth Unable to assess  Skin Reviewed  Nails Unable to assess     Labs and medications reviewed.  Folic acid 1 mg daily Thiamine 500 mg q 24 hours Zinc sulfate 220 mg daily  Diet Order:   Diet Order            Diet NPO time specified  Diet effective midnight                 EDUCATION NEEDS:   Not appropriate for education at this time  Skin:  Skin Assessment: Reviewed RN Assessment (LE edema)  Last BM:  11/25  Height:   Ht Readings from Last 1 Encounters:  01/02/2020 5\' 5"  (1.651 m)    Weight:   Wt Readings from Last 1 Encounters:  01/13/2020 56.2 kg    Ideal Body Weight:  61.8 kg  BMI:  Body mass index is 20.62 kg/m.  Estimated Nutritional Needs:   Kcal:  1600-1800  Protein:  110-125 grams  Fluid:  >/= 1.6 L/day  Corrin Parker, MS, RD, LDN RD pager number/after hours weekend pager number on Amion.

## 2020-01-10 NOTE — Progress Notes (Signed)
Daily Rounding Note  01/10/2020, 11:04 AM  LOS: 3 days   SUBJECTIVE:   Chief complaint: cirrhosis.  ? cholecystitis     Suffered PEA arrest this AM.  [facility] w subsequent hypertension after epi, bicarb, amp of D 50.  No shock or chest compressions.  Blood glucose 27 near time of arrest.    Now transferred to ICU 21M, intubated and started on tube feedings.  OBJECTIVE:         Vital signs in last 24 hours:    Temp:  [98.8 F (37.1 C)-98.9 F (37.2 C)] 98.9 F (37.2 C) (11/26 0523) Pulse Rate:  [75-93] 77 (11/26 0523) Resp:  [18-24] 24 (11/26 0900) BP: (117-134)/(89-109) 126/92 (11/26 0900) SpO2:  [100 %] 100 % (11/26 0523) FiO2 (%):  [60 %-100 %] 60 % (11/26 1051) Last BM Date: 01/09/20 Filed Weights   01/08/2020 1800  Weight: 56.2 kg   General: intubated.  Unresponsive to voice and exam   Heart: RRR.  NSR in 60s Chest: no labored breathing w ETT/vent in place Abdomen: soft, hypoactive BS.  NT.  ND.  Extremities: no CCE.  Feet warm.   Neuro/Psych:  Unresponsive.   No tremors.    Intake/Output from previous day: 11/25 0701 - 11/26 0700 In: 477 [P.O.:477] Out: 0   Intake/Output this shift: No intake/output data recorded.  Lab Results: Recent Labs    01/10/20 0220 01/10/20 0220 01/10/20 1008 01/10/20 1009 01/10/20 1045  WBC 13.5*  --  10.1 10.6*  --   HGB 12.8*   < > 11.9* 11.6* 12.9*  HCT 38.9*   < > 35.7* 36.1* 38.0*  PLT 155  --  154 147*  --    < > = values in this interval not displayed.   BMET Recent Labs    01/06/2020 2012 01/02/2020 2012 01/08/20 0314 01/09/20 0857 01/10/20 1045  NA 132*   < > 132* 131* 131*  K 4.0   < > 4.2 4.8 4.1  CL 100  --  101 97*  --   CO2 18*  --  18* 20*  --   GLUCOSE 75  --  92 92  --   BUN 28*  --  31* 36*  --   CREATININE 1.34*  --  1.34* 1.91*  --   CALCIUM 8.2*  --  8.4* 8.3*  --    < > = values in this interval not displayed.   LFT Recent Labs     12/17/2019 2012 01/08/20 0314 01/09/20 0857  PROT 5.5* 5.3* 5.2*  ALBUMIN 2.7* 2.6* 2.6*  AST 168* 184* 115*  ALT 126* 119* 103*  ALKPHOS 448* 425* 434*  BILITOT 3.7* 3.7* 5.7*   PT/INR Recent Labs    01/03/2020 2012 01/10/20 1009  LABPROT 14.0 22.5*  INR 1.1 2.1*   Hepatitis Panel Recent Labs    12/30/2019 2012  HEPBSAG NON REACTIVE  HEPBIGM NON REACTIVE    Studies/Results: ECHOCARDIOGRAM COMPLETE  Result Date: 01/08/2020    ECHOCARDIOGRAM REPORT   Patient Name:   Xaidyn E [place]ique Date of Exam: 01/08/2020 Medical Rec #:  [id number]     Height:  65.0 in Accession #:    [id number]    Weight:       123.9 lb Date of Birth:  November 15, 1965    BSA:          1.614 m Patient Age:    54 years      BP:           118/95 mmHg Patient Gender: M             HR:           103 bpm. Exam Location:  Inpatient Procedure: 2D Echo Indications:    786.09 dyspnea  History:        Patient has prior history of Echocardiogram examinations, most                 recent 07/28/2011. Risk Factors:Hypertension, Dyslipidemia and                 Current Smoker. ETOH Abuse.  Sonographer:    Jannett Celestine RDCS (AE) Referring Phys: [id number] Raynaldo Opitz RAINES IMPRESSIONS  1. Left ventricular ejection fraction, by estimation, is 30 to 35%. The left ventricle has moderately decreased function. The left ventricle demonstrates global hypokinesis. Left ventricular diastolic parameters were normal.  2. Right ventricular systolic function is moderately reduced. The right ventricular size is severely enlarged. There is mildly elevated pulmonary artery systolic pressure. The estimated right ventricular systolic pressure is 68.3 mmHg.  3. Left atrial size was moderately dilated.  4. A small pericardial effusion is present.  5. The mitral valve is normal in structure. Moderate mitral valve regurgitation. No evidence of mitral stenosis.  6. Tricuspid valve regurgitation is moderate to severe.  7. The aortic valve is tricuspid. There is mild  calcification of the aortic valve. Aortic valve regurgitation is not visualized. No aortic stenosis is present.  8. Moderately dilated pulmonary artery.  9. The inferior vena cava is dilated in size with <50% respiratory variability, suggesting right atrial pressure of 15 mmHg. Comparison(s): Changes from prior study are noted. Conclusion(s)/Recommendation(s): EF now reduced to 30-35%. RV incompletely visualized but appears dilated, with moderate to severe TR. Also with moderate MR. Some ascites seen as well, and IVC dilated. Findings communicated with Dr. Rebeca Alert at 5:47 PM. FINDINGS  Left Ventricle: Left ventricular ejection fraction, by estimation, is 30 to 35%. The left ventricle has moderately decreased function. The left ventricle demonstrates global hypokinesis. The left ventricular internal cavity size was small. There is no left ventricular hypertrophy. Left ventricular diastolic parameters were normal. Right Ventricle: The right ventricular size is severely enlarged. Right vetricular wall thickness was not well visualized. Right ventricular systolic function is moderately reduced. There is mildly elevated pulmonary artery systolic pressure. The tricuspid regurgitant velocity is 2.63 m/s, and with an assumed right atrial pressure of 15 mmHg, the estimated right ventricular systolic pressure is 41.9 mmHg. Left Atrium: Left atrial size was moderately dilated. Right Atrium: Right atrial size was not well visualized. Pericardium: A small pericardial effusion is present. Mitral Valve: The mitral valve is normal in structure. There is mild thickening of the mitral valve leaflet(s). Moderate mitral valve regurgitation. No evidence of mitral valve stenosis. Tricuspid Valve: The tricuspid valve is normal in structure. Tricuspid valve regurgitation is moderate to severe. Aortic Valve: The aortic valve is tricuspid. There is mild calcification of the aortic valve. Aortic valve regurgitation is not visualized. No aortic  stenosis is present. Pulmonic Valve: The pulmonic valve was grossly normal. Pulmonic valve regurgitation is not visualized.  No evidence of pulmonic stenosis. Aorta: The aortic root and ascending aorta are structurally normal, with no evidence of dilitation. Pulmonary Artery: The pulmonary artery is moderately dilated. Venous: The inferior vena cava is dilated in size with less than 50% respiratory variability, suggesting right atrial pressure of 15 mmHg. IAS/Shunts: The atrial septum is grossly normal. Additional Comments: Moderate ascites is present.  LEFT VENTRICLE PLAX 2D LVIDd:         4.30 cm  Diastology LVIDs:         3.40 cm  LV e' medial:    9.03 cm/s LV PW:         1.00 cm  LV E/e' medial:  11.8 LV IVS:        1.10 cm  LV e' lateral:   9.57 cm/s LVOT diam:     2.00 cm  LV E/e' lateral: 11.2 LV SV:         17 LV SV Index:   11 LVOT Area:     3.14 cm  LEFT ATRIUM           Index LA diam:      3.70 cm 2.29 cm/m LA Vol (A2C): 73.9 ml 45.79 ml/m  AORTIC VALVE LVOT Vmax:   52.20 cm/s LVOT Vmean:  37.600 cm/s LVOT VTI:    0.055 m  AORTA Ao Root diam: 2.60 cm MITRAL VALVE                 TRICUSPID VALVE MV Area (PHT): 5.02 cm      TR Peak grad:   27.7 mmHg MV Decel Time: 151 msec      TR Vmax:        263.00 cm/s MR Peak grad:    101.2 mmHg MR Mean grad:    57.0 mmHg   SHUNTS MR Vmax:         503.00 cm/s Systemic VTI:  0.06 m MR Vmean:        335.0 cm/s  Systemic Diam: 2.00 cm MR PISA:         4.02 cm MR PISA Eff ROA: 27 mm MR PISA Radius:  0.80 cm MV E velocity: 107.00 cm/s Buford Dresser MD Electronically signed by Buford Dresser MD Signature Date/Time: 01/08/2020/5:47:16 PM    Final    US Abdomen Limited RUQ (LIVER/GB)  Result Date: 01/08/2020 CLINICAL DATA:  Hepatic cirrhosis EXAM: ULTRASOUND ABDOMEN LIMITED RIGHT UPPER QUADRANT COMPARISON:  CT abdomen and pelvis January 06, 2019. FINDINGS: Gallbladder: Within the gallbladder, there are echogenic foci which move and shadow consistent  with cholelithiasis. Largest gallstone measures 5 mm in length. The gallbladder wall is thickened and edematous with a degree of pericholecystic fluid. No sonographic Murphy sign noted by sonographer. Common bile duct: Diameter: 7 mm, upper normal. No intrahepatic or extrahepatic biliary duct dilatation. Liver: No focal lesion identified. Liver contour is nodular with diffuse increase in liver echogenicity. Portal vein is patent on color Doppler imaging with normal direction of blood flow towards the liver. Other: There is a cyst in the lower pole of the right kidney measuring 1.1 x 0.7 x 1.1 cm. IMPRESSION: 1. Cholelithiasis with edematous, thickened gallbladder wall and pericholecystic fluid. Concern for acute cholecystitis given this appearance. 2. Nodular liver contour with diffuse increase in liver echogenicity, findings concerning for hepatic cirrhosis. No focal liver lesions evident. It must be cautioned that the sensitivity of ultrasound for detection of focal liver lesions is diminished in this circumstance. 3.  Small cyst lower pole  right kidney. These results will be called to the ordering clinician or representative by the Radiologist Assistant, and communication documented in the PACS or [employer]. Electronically Signed   By: Lowella Grip III M.D.   On: 01/08/2020 13:39    ASSESMENT:   *   PEA arrest this AM, aborted/RSC w meds.  Now in NSR Hypoglycemia of 27 may have contributed 11/24 2 D echo: LVEF 30 to 35%.  Severe R ventricular enlargement and moderately diminished function.  Small peri-cardial effusion.  Mod to severe TR.  Moderately dilated pulm artery and mild increased pulm pressure.   Head CT ordered.     *   Cirrhosis of liver.  Likely due to ETOH.   INR WNL, platelets slightly reduced at 149K. Hepatitis A total Ab is non-reactive.    Pending labs include anti-smooth muscle antibody, mitochondrial antibodies IgG, ANA.  *   Elevated LFTs.  Ultrasound showed GB wall  thickening, edema, pericholecystic fluid but no abdominal pain, nausea vomiting, anorexia or other symptoms to suggest active cholecystitis.  Suspect low protein state, cirrhosis is cause for the ultrasound findings.   HIDA scan was planned for today but on hold due to this morning's PEA arrest. t bili 3.7 >> 5.7.  Alk phos 448 >> 434.  AST/ALT 184/119 >> 115/103.    *   Chronic alcoholism.  *    Weeping lower extremity edema (sx and leading cause for admission).  Started on IV Lasix, Aldactone 100 mg daily.  The aldactone was started at moderately aggressive dosing by teaching service and w renal insufficiency, going to drop dose to 50 mg/day (got 148m already this AM).  Furosemide discontinued as of today, last dose was 1430 yesterday 11/25.  Potassium not deranged.    *   Weight loss.  Patient reported estimated 70 #decline over the last several years but no anorexia.  Low BMI, low albumin C/W malnutrition  *   Hyponatremia  *   Elevated INR, 1.1 >> 2.1.    *   AKI  *   Elevated ammonia level 123.  *   Minor macrocytic anemia.    *   Adenomatous colon polyps.  Surveillance at 3 years due May 2021, so overdue.     PLAN   *   Give IV Vit K x 3 d.    *   Await head CT.    *   No need for urgent HIDA, can pursue this next week.   GI available but will step back for next few days and revisit pt next week.    *   Needs Hep A and B vaccination, this can be done outpt in future.    Also needs variceal screening EGD and the overdue polyp surveillance colonoscopy, outpt unless develops GI bleed while inpt.     *   Aldactone 50 mg day starts tomorrow.        SAzucena Freed 01/10/2020, 11:04 AM Phone [phone]

## 2020-01-10 NOTE — Progress Notes (Signed)
RT note-Patient transported to CT and back to 3M08, remains on current ventilator settings.

## 2020-01-10 NOTE — Progress Notes (Signed)
Subjective/Chief Complaint: Patient found unresponsive and pulseless lying perpendicular across the bed this morning. Code called.    Objective: Vital signs in last 24 hours: Temp:  [98.2 F (36.8 C)-98.9 F (37.2 C)] 98.9 F (37.2 C) (11/26 0523) Pulse Rate:  [75-94] 77 (11/26 0523) Resp:  [18-20] 19 (11/26 0523) BP: (117-134)/(89-109) 120/89 (11/26 0523) SpO2:  [96 %-100 %] 100 % (11/26 0523) Last BM Date: 01/09/20  Intake/Output from previous day: 11/25 0701 - 11/26 0700 In: 477 [P.O.:477] Out: 0  Intake/Output this shift: No intake/output data recorded.  General appearance: unresponsive Resp: agonal Cardio: no palpable pulse GI: soft, mildly distended, mottled skin on lower abdominal wall  Lab Results:  Recent Labs    01/09/20 0857 01/10/20 0220  WBC 7.8 13.5*  HGB 11.8* 12.8*  HCT 34.3* 38.9*  PLT 155 155   BMET Recent Labs    01/08/20 0314 01/09/20 0857  NA 132* 131*  K 4.2 4.8  CL 101 97*  CO2 18* 20*  GLUCOSE 92 92  BUN 31* 36*  CREATININE 1.34* 1.91*  CALCIUM 8.4* 8.3*   PT/INR Recent Labs    12/31/2019 2012  LABPROT 14.0  INR 1.1   ABG No results for input(s): PHART, HCO3 in the last 72 hours.  Invalid input(s): PCO2, PO2  Studies/Results: ECHOCARDIOGRAM COMPLETE  Result Date: 01/08/2020    ECHOCARDIOGRAM REPORT   Patient Name:   Joseph Underwood Date of Exam: 01/08/2020 Medical Rec #:  [id number]     Height:       65.0 in Accession #:    [id number]    Weight:       123.9 lb Date of Birth:  1965/04/07    BSA:          1.614 m Patient Age:    54 years      BP:           118/95 mmHg Patient Gender: M             HR:           103 bpm. Exam Location:  Inpatient Procedure: 2D Echo Indications:    786.09 dyspnea  History:        Patient has prior history of Echocardiogram examinations, most                 recent 07/28/2011. Risk Factors:Hypertension, Dyslipidemia and                 Current Smoker. ETOH Abuse.  Sonographer:    Jannett Celestine  RDCS (AE) Referring Phys: [id number] Raynaldo Opitz RAINES IMPRESSIONS  1. Left ventricular ejection fraction, by estimation, is 30 to 35%. The left ventricle has moderately decreased function. The left ventricle demonstrates global hypokinesis. Left ventricular diastolic parameters were normal.  2. Right ventricular systolic function is moderately reduced. The right ventricular size is severely enlarged. There is mildly elevated pulmonary artery systolic pressure. The estimated right ventricular systolic pressure is 89.3 mmHg.  3. Left atrial size was moderately dilated.  4. A small pericardial effusion is present.  5. The mitral valve is normal in structure. Moderate mitral valve regurgitation. No evidence of mitral stenosis.  6. Tricuspid valve regurgitation is moderate to severe.  7. The aortic valve is tricuspid. There is mild calcification of the aortic valve. Aortic valve regurgitation is not visualized. No aortic stenosis is present.  8. Moderately dilated pulmonary artery.  9. The inferior vena cava is dilated in size with <50% respiratory  variability, suggesting right atrial pressure of 15 mmHg. Comparison(s): Changes from prior study are noted. Conclusion(s)/Recommendation(s): EF now reduced to 30-35%. RV incompletely visualized but appears dilated, with moderate to severe TR. Also with moderate MR. Some ascites seen as well, and IVC dilated. Findings communicated with Dr. Rebeca Alert at 5:47 PM. FINDINGS  Left Ventricle: Left ventricular ejection fraction, by estimation, is 30 to 35%. The left ventricle has moderately decreased function. The left ventricle demonstrates global hypokinesis. The left ventricular internal cavity size was small. There is no left ventricular hypertrophy. Left ventricular diastolic parameters were normal. Right Ventricle: The right ventricular size is severely enlarged. Right vetricular wall thickness was not well visualized. Right ventricular systolic function is moderately reduced.  There is mildly elevated pulmonary artery systolic pressure. The tricuspid regurgitant velocity is 2.63 m/s, and with an assumed right atrial pressure of 15 mmHg, the estimated right ventricular systolic pressure is 70.7 mmHg. Left Atrium: Left atrial size was moderately dilated. Right Atrium: Right atrial size was not well visualized. Pericardium: A small pericardial effusion is present. Mitral Valve: The mitral valve is normal in structure. There is mild thickening of the mitral valve leaflet(s). Moderate mitral valve regurgitation. No evidence of mitral valve stenosis. Tricuspid Valve: The tricuspid valve is normal in structure. Tricuspid valve regurgitation is moderate to severe. Aortic Valve: The aortic valve is tricuspid. There is mild calcification of the aortic valve. Aortic valve regurgitation is not visualized. No aortic stenosis is present. Pulmonic Valve: The pulmonic valve was grossly normal. Pulmonic valve regurgitation is not visualized. No evidence of pulmonic stenosis. Aorta: The aortic root and ascending aorta are structurally normal, with no evidence of dilitation. Pulmonary Artery: The pulmonary artery is moderately dilated. Venous: The inferior vena cava is dilated in size with less than 50% respiratory variability, suggesting right atrial pressure of 15 mmHg. IAS/Shunts: The atrial septum is grossly normal. Additional Comments: Moderate ascites is present.  LEFT VENTRICLE PLAX 2D LVIDd:         4.30 cm  Diastology LVIDs:         3.40 cm  LV e' medial:    9.03 cm/s LV PW:         1.00 cm  LV E/e' medial:  11.8 LV IVS:        1.10 cm  LV e' lateral:   9.57 cm/s LVOT diam:     2.00 cm  LV E/e' lateral: 11.2 LV SV:         17 LV SV Index:   11 LVOT Area:     3.14 cm  LEFT ATRIUM           Index LA diam:      3.70 cm 2.29 cm/m LA Vol (A2C): 73.9 ml 45.79 ml/m  AORTIC VALVE LVOT Vmax:   52.20 cm/s LVOT Vmean:  37.600 cm/s LVOT VTI:    0.055 m  AORTA Ao Root diam: 2.60 cm MITRAL VALVE                  TRICUSPID VALVE MV Area (PHT): 5.02 cm      TR Peak grad:   27.7 mmHg MV Decel Time: 151 msec      TR Vmax:        263.00 cm/s MR Peak grad:    101.2 mmHg MR Mean grad:    57.0 mmHg   SHUNTS MR Vmax:         503.00 cm/s Systemic VTI:  0.06 m MR Vmean:  335.0 cm/s  Systemic Diam: 2.00 cm MR PISA:         4.02 cm MR PISA Eff ROA: 27 mm MR PISA Radius:  0.80 cm MV E velocity: 107.00 cm/s Buford Dresser MD Electronically signed by Buford Dresser MD Signature Date/Time: 01/08/2020/5:47:16 PM    Final    US Abdomen Limited RUQ (LIVER/GB)  Result Date: 01/08/2020 CLINICAL DATA:  Hepatic cirrhosis EXAM: ULTRASOUND ABDOMEN LIMITED RIGHT UPPER QUADRANT COMPARISON:  CT abdomen and pelvis January 06, 2019. FINDINGS: Gallbladder: Within the gallbladder, there are echogenic foci which move and shadow consistent with cholelithiasis. Largest gallstone measures 5 mm in length. The gallbladder wall is thickened and edematous with a degree of pericholecystic fluid. No sonographic Murphy sign noted by sonographer. Common bile duct: Diameter: 7 mm, upper normal. No intrahepatic or extrahepatic biliary duct dilatation. Liver: No focal lesion identified. Liver contour is nodular with diffuse increase in liver echogenicity. Portal vein is patent on color Doppler imaging with normal direction of blood flow towards the liver. Other: There is a cyst in the lower pole of the right kidney measuring 1.1 x 0.7 x 1.1 cm. IMPRESSION: 1. Cholelithiasis with edematous, thickened gallbladder wall and pericholecystic fluid. Concern for acute cholecystitis given this appearance. 2. Nodular liver contour with diffuse increase in liver echogenicity, findings concerning for hepatic cirrhosis. No focal liver lesions evident. It must be cautioned that the sensitivity of ultrasound for detection of focal liver lesions is diminished in this circumstance. 3.  Small cyst lower pole right kidney. These results will be called to the  ordering clinician or representative by the Radiologist Assistant, and communication documented in the PACS or [employer]. Electronically Signed   By: Lowella Grip III M.D.   On: 01/08/2020 13:39    Anti-infectives: Anti-infectives (From admission, onward)   None      Assessment/Plan: Fluid overload with Lower extremity swelling Hx Alcohol abuse Hx Cirrhosis Hx alcohol related seizures Ongoing tobacco use DOE Hx hypertension Hyperlipidemia AKI Hyponatremia Thrombocytopenia  - platelets 165K>>149K  RUQ pain with cholelithiasis, edematous gb wall Elevated LFT's -suspect this is all secondary to liver failure, not cholecystitis.  FEN:  NPO ID:  None DVT: Lovenox Follow up:  TBD  Plan:  Patient found in extremis this morning. Was able to get ROSC within a few minutes of initiating acls. When stable consider MRCP/HIDA for further workup of rising tbili, though gallbladder edema noted on [place] is likely secondary to liver/cardiac issues. Would recommend establishing goals of care asap given pre-admission comorbidities.     LOS: 3 days    Clovis Riley 01/10/2020

## 2020-01-10 NOTE — Procedures (Signed)
Patient Name: Joseph Underwood  MRN: [id number]  Epilepsy Attending: Lora Havens  Referring Physician/Provider: Asa Saunas, NP Date: 01/10/2020 Duration: 26.35 mins  Patient history: 54 year old male with history of epilepsy status post cardiac arrest.  EEG to evaluate for seizures.  Level of alertness:  comatose  AEDs during EEG study: Keppra, phenytoin  Technical aspects: This EEG study was done with scalp electrodes positioned according to the 10-20 International system of electrode placement. Electrical activity was acquired at a sampling rate of 500Hz  and reviewed with a high frequency filter of 70Hz  and a low frequency filter of 1Hz . EEG data were recorded continuously and digitally stored.   Description: EEG showed continuous generalized 3 to 6 Hz theta-delta slowing as well as intermittent generalized 15-18hz  beta activity. Hyperventilation and photic stimulation were not performed.     ABNORMALITY -Continuous slow, generalized  IMPRESSION: This study is suggestive of moderate to severe diffuse encephalopathy, nonspecific etiology but likely related to sedation. No seizures or epileptiform discharges were seen throughout the recording.  Joseph Underwood Barbra Sarks

## 2020-01-10 NOTE — Progress Notes (Addendum)
Progress Note  Patient Name: Joseph Underwood Date of Encounter: 01/10/2020  [facility] Cardiologist: Candee Furbish, MD   Subjective   Pt is unresponsive currently, breaths over the vent at times. Reports are that he was fine this am, staff was with pt when he developed decreased LOC RN unable to rouse him, ?weak pulse but not clear, CPR started Code Blue called Pt rec'd 1.5 amps Epi, 1 amp bicarb, 1 amp D50 (BG 27) CPR duration approx 4", may have been a little longer because Code not called immediately Tele reviewed very briefly, HR slowed, occ vent escape beats or narrow complex beats, then asystole. Not on tele for part of this, but no VT/VF seen, no shocks.  Inpatient Medications    Scheduled Meds:  atorvastatin  20 mg Per Tube Daily   Chlorhexidine Gluconate Cloth  6 each Topical Daily   enoxaparin (LOVENOX) injection  40 mg Subcutaneous QHS   feeding supplement (PROSource TF)  45 mL Per Tube BID   feeding supplement (VITAL HIGH PROTEIN)  1,000 mL Per Tube Q24H   fenofibrate  160 mg Per Tube Daily   folic acid  1 mg Per Tube Daily   levETIRAcetam  500 mg Per Tube BID   multivitamin  15 mL Per Tube Daily   pantoprazole sodium  40 mg Per Tube Daily   phenytoin  100 mg Per Tube TID   spironolactone  100 mg Per Tube Daily   thiamine  100 mg Per Tube Daily   Or   thiamine  100 mg Intravenous Daily   Continuous Infusions:   PRN Meds: LORazepam **OR** LORazepam, oxyCODONE   Vital Signs    Vitals:   01/09/20 1633 01/09/20 1811 01/09/20 2114 01/10/20 0523  BP: (!) 120/95 (!) 134/109 (!) 117/95 120/89  Pulse: 93 82 75 77  Resp: 20 20 18 19   Temp: 98.8 F (37.1 C)  98.9 F (37.2 C) 98.9 F (37.2 C)  TempSrc:    Oral  SpO2:  100% 100% 100%  Weight:      Height:        Intake/Output Summary (Last 24 hours) at 01/10/2020 0843 Last data filed at 01/10/2020 0523 Gross per 24 hour  Intake 477 ml  Output 0 ml  Net 477 ml   Last 3 Weights 01/12/2020 12/20/2019  07/08/2019  Weight (lbs) 123 lb 14.4 oz 107 lb 3.2 oz 110 lb  Weight (kg) 56.2 kg 48.626 kg 49.896 kg      Telemetry    Currenly SR, HR 80s, o/w see above - Personally Reviewed  ECG    pending - Personally Reviewed  Physical Exam   GEN: No acute distress on the vent, breathing over the vent   Neck: No JVD seen, pt supin Cardiac: RRR, no murmurs, rubs, or gallops.  Respiratory: few rales bases, otherwise clear to auscultation bilaterally. GI: Soft, nontender, non-distended  MS: No edema; No deformity. Neuro:  Not able to assess  Labs    High Sensitivity Troponin:  No results for input(s): TROPONINIHS in the last 720 hours.    Chemistry Recent Labs  Lab 12/21/2019 2012 01/08/20 0314 01/09/20 0857  NA 132* 132* 131*  K 4.0 4.2 4.8  CL 100 101 97*  CO2 18* 18* 20*  GLUCOSE 75 92 92  BUN 28* 31* 36*  CREATININE 1.34* 1.34* 1.91*  CALCIUM 8.2* 8.4* 8.3*  PROT 5.5* 5.3* 5.2*  ALBUMIN 2.7* 2.6* 2.6*  AST 168* 184* 115*  ALT 126*  119* 103*  ALKPHOS 448* 425* 434*  BILITOT 3.7* 3.7* 5.7*  GFRNONAA >60 >60 41*  ANIONGAP 14 13 14      Hematology Recent Labs  Lab 01/08/20 0314 01/09/20 0857 01/10/20 0220  WBC 6.1 7.8 13.5*  RBC 3.17* 3.30* 3.58*  HGB 11.2* 11.8* 12.8*  HCT 32.4* 34.3* 38.9*  MCV 102.2* 103.9* 108.7*  MCH 35.3* 35.8* 35.8*  MCHC 34.6 34.4 32.9  RDW 15.8* 15.9* 16.4*  PLT 149* 155 155   No results found for: AMYLASE Lab Results  Component Value Date   LIPASE 38 01/06/2019   Lab Results  Component Value Date   INR 1.1 12/25/2019   INR 0.91 01/10/2018   INR 1.05 07/05/2016   Lab Results  Component Value Date   CHOL 274 (H) 12/29/2015   HDL 88 12/29/2015   LDLCALC 151 (H) 12/29/2015   LDLDIRECT 130 (H) 03/29/2014   TRIG 175 (H) 12/29/2015   CHOLHDL 3.1 12/29/2015    BNPNo results for input(s): BNP, PROBNP in the last 168 hours.   DDimer No results for input(s): DDIMER in the last 168 hours.   Radiology    ECHOCARDIOGRAM  COMPLETE  Result Date: 01/08/2020    ECHOCARDIOGRAM REPORT   Patient Name:   Joseph Underwood Date of Exam: 01/08/2020 Medical Rec #:  [id number]     Height:       65.0 in Accession #:    [id number]    Weight:       123.9 lb Date of Birth:  14-May-1965    BSA:          1.614 m Patient Age:    54 years      BP:           118/95 mmHg Patient Gender: M             HR:           103 bpm. Exam Location:  Inpatient Procedure: 2D Echo Indications:    786.09 dyspnea  History:        Patient has prior history of Echocardiogram examinations, most                 recent 07/28/2011. Risk Factors:Hypertension, Dyslipidemia and                 Current Smoker. ETOH Abuse.  Sonographer:    Jannett Celestine RDCS (AE) Referring Phys: [id number] Raynaldo Opitz RAINES IMPRESSIONS  1. Left ventricular ejection fraction, by estimation, is 30 to 35%. The left ventricle has moderately decreased function. The left ventricle demonstrates global hypokinesis. Left ventricular diastolic parameters were normal.  2. Right ventricular systolic function is moderately reduced. The right ventricular size is severely enlarged. There is mildly elevated pulmonary artery systolic pressure. The estimated right ventricular systolic pressure is 71.6 mmHg.  3. Left atrial size was moderately dilated.  4. A small pericardial effusion is present.  5. The mitral valve is normal in structure. Moderate mitral valve regurgitation. No evidence of mitral stenosis.  6. Tricuspid valve regurgitation is moderate to severe.  7. The aortic valve is tricuspid. There is mild calcification of the aortic valve. Aortic valve regurgitation is not visualized. No aortic stenosis is present.  8. Moderately dilated pulmonary artery.  9. The inferior vena cava is dilated in size with <50% respiratory variability, suggesting right atrial pressure of 15 mmHg. Comparison(s): Changes from prior study are noted. Conclusion(s)/Recommendation(s): EF now reduced to 30-35%. RV incompletely visualized  but appears dilated,  with moderate to severe TR. Also with moderate MR. Some ascites seen as well, and IVC dilated. Findings communicated with Dr. Rebeca Alert at 5:47 PM. FINDINGS  Left Ventricle: Left ventricular ejection fraction, by estimation, is 30 to 35%. The left ventricle has moderately decreased function. The left ventricle demonstrates global hypokinesis. The left ventricular internal cavity size was small. There is no left ventricular hypertrophy. Left ventricular diastolic parameters were normal. Right Ventricle: The right ventricular size is severely enlarged. Right vetricular wall thickness was not well visualized. Right ventricular systolic function is moderately reduced. There is mildly elevated pulmonary artery systolic pressure. The tricuspid regurgitant velocity is 2.63 m/s, and with an assumed right atrial pressure of 15 mmHg, the estimated right ventricular systolic pressure is 93.7 mmHg. Left Atrium: Left atrial size was moderately dilated. Right Atrium: Right atrial size was not well visualized. Pericardium: A small pericardial effusion is present. Mitral Valve: The mitral valve is normal in structure. There is mild thickening of the mitral valve leaflet(s). Moderate mitral valve regurgitation. No evidence of mitral valve stenosis. Tricuspid Valve: The tricuspid valve is normal in structure. Tricuspid valve regurgitation is moderate to severe. Aortic Valve: The aortic valve is tricuspid. There is mild calcification of the aortic valve. Aortic valve regurgitation is not visualized. No aortic stenosis is present. Pulmonic Valve: The pulmonic valve was grossly normal. Pulmonic valve regurgitation is not visualized. No evidence of pulmonic stenosis. Aorta: The aortic root and ascending aorta are structurally normal, with no evidence of dilitation. Pulmonary Artery: The pulmonary artery is moderately dilated. Venous: The inferior vena cava is dilated in size with less than 50% respiratory variability,  suggesting right atrial pressure of 15 mmHg. IAS/Shunts: The atrial septum is grossly normal. Additional Comments: Moderate ascites is present.  LEFT VENTRICLE PLAX 2D LVIDd:         4.30 cm  Diastology LVIDs:         3.40 cm  LV e' medial:    9.03 cm/s LV PW:         1.00 cm  LV E/e' medial:  11.8 LV IVS:        1.10 cm  LV e' lateral:   9.57 cm/s LVOT diam:     2.00 cm  LV E/e' lateral: 11.2 LV SV:         17 LV SV Index:   11 LVOT Area:     3.14 cm  LEFT ATRIUM           Index LA diam:      3.70 cm 2.29 cm/m LA Vol (A2C): 73.9 ml 45.79 ml/m  AORTIC VALVE LVOT Vmax:   52.20 cm/s LVOT Vmean:  37.600 cm/s LVOT VTI:    0.055 m  AORTA Ao Root diam: 2.60 cm MITRAL VALVE                 TRICUSPID VALVE MV Area (PHT): 5.02 cm      TR Peak grad:   27.7 mmHg MV Decel Time: 151 msec      TR Vmax:        263.00 cm/s MR Peak grad:    101.2 mmHg MR Mean grad:    57.0 mmHg   SHUNTS MR Vmax:         503.00 cm/s Systemic VTI:  0.06 m MR Vmean:        335.0 cm/s  Systemic Diam: 2.00 cm MR PISA:         4.02 cm MR PISA Eff  ROA: 27 mm MR PISA Radius:  0.80 cm MV E velocity: 107.00 cm/s Buford Dresser MD Electronically signed by Buford Dresser MD Signature Date/Time: 01/08/2020/5:47:16 PM    Final    US Abdomen Limited RUQ (LIVER/GB)  Result Date: 01/08/2020 CLINICAL DATA:  Hepatic cirrhosis EXAM: ULTRASOUND ABDOMEN LIMITED RIGHT UPPER QUADRANT COMPARISON:  CT abdomen and pelvis January 06, 2019. FINDINGS: Gallbladder: Within the gallbladder, there are echogenic foci which move and shadow consistent with cholelithiasis. Largest gallstone measures 5 mm in length. The gallbladder wall is thickened and edematous with a degree of pericholecystic fluid. No sonographic Murphy sign noted by sonographer. Common bile duct: Diameter: 7 mm, upper normal. No intrahepatic or extrahepatic biliary duct dilatation. Liver: No focal lesion identified. Liver contour is nodular with diffuse increase in liver echogenicity.  Portal vein is patent on color Doppler imaging with normal direction of blood flow towards the liver. Other: There is a cyst in the lower pole of the right kidney measuring 1.1 x 0.7 x 1.1 cm. IMPRESSION: 1. Cholelithiasis with edematous, thickened gallbladder wall and pericholecystic fluid. Concern for acute cholecystitis given this appearance. 2. Nodular liver contour with diffuse increase in liver echogenicity, findings concerning for hepatic cirrhosis. No focal liver lesions evident. It must be cautioned that the sensitivity of ultrasound for detection of focal liver lesions is diminished in this circumstance. 3.  Small cyst lower pole right kidney. These results will be called to the ordering clinician or representative by the Radiologist Assistant, and communication documented in the PACS or [employer]. Electronically Signed   By: Lowella Grip III M.D.   On: 01/08/2020 13:39    Cardiac Studies   ECHO: 01/08/2020  1. Left ventricular ejection fraction, by estimation, is 30 to 35%. The  left ventricle has moderately decreased function. The left ventricle  demonstrates global hypokinesis. Left ventricular diastolic parameters  were normal.   2. Right ventricular systolic function is moderately reduced. The right  ventricular size is severely enlarged. There is mildly elevated pulmonary  artery systolic pressure. The estimated right ventricular systolic  pressure is 26.9 mmHg.   3. Left atrial size was moderately dilated.   4. A small pericardial effusion is present.   5. The mitral valve is normal in structure. Moderate mitral valve  regurgitation. No evidence of mitral stenosis.   6. Tricuspid valve regurgitation is moderate to severe.   7. The aortic valve is tricuspid. There is mild calcification of the  aortic valve. Aortic valve regurgitation is not visualized. No aortic  stenosis is present.   8. Moderately dilated pulmonary artery.   9. The inferior vena cava is dilated in  size with <50% respiratory  variability, suggesting right atrial pressure of 15 mmHg.   Comparison(s): Changes from prior study are noted.   Patient Profile     54 y.o. male with a hx of alcohol induced cirrhosis, alcohol induced seizures, alcohol abuse, current smoker, HTN, and HLD who was admitted 11/23 with SOB, LE edema, thickened GB wall, EF now 30-34%  Assessment & Plan    PEA Arrest - appears to be asystolic 2nd hypoglycemia - additional labs and info pending - no evidence of VT/VF; BB Dc'ed. - inhospital arrest with some neurologic recovery; will defer cooling decision to primary  Acute on Chronic biventricular heart failure - suspect alcohol mediatred - f/u on CXR, but volume status ok right now - no evidence HF contributed to arrest; presently  - if goals of care indicated, would be amenable  to LHC/RHC when stable; likely out of the unit at least  Cirrhosis, ?cholecystitis - LFTs somewhat improved, but still elevated - likely alcohol mediated - if goals of care indicate, to return spironolactone and lasix dosing for cirrhosis doses when able  Otherwise, per IM/CCM, no seizure activity noted prior to code Active Problems:   Alcohol use disorder, moderate, dependence (HCC)   Cirrhosis (HCC)   Epilepsy ([place])   Volume overload   Lower extremity edema   Macrocytic anemia   For questions or updates, please contact [facility] Please consult [email or web address] for contact info under        Signed, Rosaria Ferries, PA-C  01/10/2020, 8:43 AM    Personally seen and examined. Agree with APP above with edits made above and the following comments: 54 yo M with alcohol abuse and cirrhosis with alcohol medicated cardiomyopathy Arrested, PEA, in the setting of hypoglycemia Presently no evidence of HF mediated arrest. Discussed with CCM MD; in collaboration will sign off at this time, but happy to re-engage as new issues arise.  [facility] will sign off.   Medication  Recommendations: spiro/lasix when able   Other recommendations (labs, testing, etc):  none Follow up as an outpatient:  If stabilizes and improves to the floor, can arrange outpatient f/u if within GOC  Werner Lean, MD

## 2020-01-10 NOTE — Progress Notes (Signed)
EEG complete - results pending 

## 2020-01-10 NOTE — Consult Note (Addendum)
NAME:  Joseph Underwood, MRN:  [id number], DOB:  September 20, 1965, LOS: 3 ADMISSION DATE:  12/31/2019, CONSULTATION DATE: 01/10/2020 REFERRING MD: Internal medicine teaching service, CHIEF COMPLAINT: Status post PEA arrest  Brief History   01/10/2020 suffered PEA arrest transferred to intensive care unit  History of present illness   54 year old male who was admitted 01/14/2020 with chief complaints of lower extremity edema and liver dysfunction along with increasing renal dysfunction.  He is being aggressively diuresed and on the morning of 01/10/2020 at 075 3 hours he was found in PEA arrest.  CODE BLUE was initiated he received 1 amp of epi 1 amp bicarb 1 amp of D50 intubation per anesthesia with return of spontaneous circulation.  He has been noted to be hypertensive following code arrest.  He is transferred to the intensive care your pulmonary critical care is assumed his care.  His mother was updated by phone on 01/10/2020 at 0830 hrs.  Past Medical History   Past Medical History:  Diagnosis Date  . Alcohol abuse   . Alcohol related seizure ([place])   . Alcoholic hepatitis   . Allergy   . Cirrhosis of liver ([place])    Assumed secondary to ETOH.  First noted on CTAP of 06/2018.  Marland Kitchen Colon polyps 06/2016   For polyps removed at colonoscopy.  All were tubular adenomas without high-grade dysplasia.  Marland Kitchen Hiccups   . HTN (hypertension)   . Hyperlipidemia   . Seizures ([place]) 03/10/2014     Significant Hospital Events   01/10/2020 PEA arrest  Consults:  01/10/2020 pulmonary critical care Cardiology  Procedures:  01/10/2020 intubation during code per anesthesia  Significant Diagnostic Tests:    Micro Data:   Antimicrobials:    Interim history/subjective:  54 year old admitted with alcohol abuse liver dysfunction you had a PEA arrest 01/10/2020 at 0753 requiring epinephrine, bicarb, D50 along with intubation by anesthesia.  Transferred to the intensive care unit ended pulmonary critical  care service  Objective   Blood pressure 120/89, pulse 77, temperature 98.9 F (37.2 C), temperature source Oral, resp. rate 19, height 5\' 5"  (1.651 m), weight 56.2 kg, SpO2 100 %.        Intake/Output Summary (Last 24 hours) at 01/10/2020 0825 Last data filed at 01/10/2020 2707 Gross per 24 hour  Intake 477 ml  Output 0 ml  Net 477 ml   Filed Weights   01/06/2020 1800  Weight: 56.2 kg    Examination: General: Cachectic male who status post PEA arrest HENT: No JVD is appreciated, orotracheal tube is in place. Lungs: Coarse rhonchi bilaterally Cardiovascular: Distant but regular Abdomen: Mild distention Extremities: 2-3+ lower extremity edema Neuro: Some agonal breathing but otherwise unresponsive positive gag   Resolved Hospital Problem list     Assessment & Plan:  Ventilator dependent respiratory failure following PEA arrest 01/10/2020 at 0753.  Return of spontaneous circulation obtained status post epinephrine, bicarb, glucose. Ventilator bundle Serial chest x-ray Weaning protocol Retract et tube 3 cm 01/10/20 1000. RT aware.  Status post PEA arrest Hypertension Serial blood work Cardiology is following TEE in future Confirmed proper electrolyte balance  Liver dysfunction in the setting of alcohol abuse Continue to monitor liver function Monitor ammonia  Acute renal insufficiency Lab Results  Component Value Date   CREATININE 1.91 (H) 01/09/2020   CREATININE 1.34 (H) 01/08/2020   CREATININE 1.34 (H) 12/24/2019   CREATININE 0.72 07/10/2014   CREATININE 0.65 06/13/2014   CREATININE 1.05 04/15/2014  Monitor   Prolonged alcohol abuse  and presumed withdrawal CIWA protocol IV sedation for tube tolerance Alcohol cessation If survives follow-up outpatient counseling for alcohol abuse  Best practice (evaluated daily)   Diet: NPO Pain/Anxiety/Delirium protocol (if indicated): As needed VAP protocol (if indicated): In place DVT prophylaxis: In place GI  prophylaxis: PPI Glucose control: Sliding scale insulin protocol Mobility: Dressed last date of multidisciplinary goals of care discussion 01/10/2020 Family and staff present family not present, but mother was updated by phone 01/10/2020.  Multiple nurses at the bedside along with internal medicine teaching service Summary of discussion status post PEA arrest successful return of spontaneous circulation transferred to the intensive care unit Follow up goals of care discussion due in 1 week Code Status: Full Disposition: Transfer to intensive care unit  Labs   CBC: Recent Labs  Lab 12/30/2019 2012 01/08/20 0314 01/09/20 0857 01/10/20 0220  WBC 6.5 6.1 7.8 13.5*  NEUTROABS 4.8  --   --   --   HGB 11.8* 11.2* 11.8* 12.8*  HCT 33.6* 32.4* 34.3* 38.9*  MCV 102.1* 102.2* 103.9* 108.7*  PLT 165 149* 155 270    Basic Metabolic Panel: Recent Labs  Lab 12/19/2019 2012 01/08/20 0314 01/09/20 0857  NA 132* 132* 131*  K 4.0 4.2 4.8  CL 100 101 97*  CO2 18* 18* 20*  GLUCOSE 75 92 92  BUN 28* 31* 36*  CREATININE 1.34* 1.34* 1.91*  CALCIUM 8.2* 8.4* 8.3*   GFR: Estimated Creatinine Clearance: 35.1 mL/min (A) (by C-G formula based on SCr of 1.91 mg/dL (H)). Recent Labs  Lab 01/10/2020 2012 01/08/20 0314 01/09/20 0857 01/10/20 0220  WBC 6.5 6.1 7.8 13.5*    Liver Function Tests: Recent Labs  Lab 12/30/2019 2012 01/08/20 0314 01/09/20 0857  AST 168* 184* 115*  ALT 126* 119* 103*  ALKPHOS 448* 425* 434*  BILITOT 3.7* 3.7* 5.7*  PROT 5.5* 5.3* 5.2*  ALBUMIN 2.7* 2.6* 2.6*   No results for input(s): LIPASE, AMYLASE in the last 168 hours. No results for input(s): AMMONIA in the last 168 hours.  ABG    Component Value Date/Time   PHART 7.417 07/27/2011 2058   PCO2ART 35.2 07/27/2011 2058   PO2ART 89.4 07/27/2011 2058   HCO3 22.3 07/27/2011 2058   TCO2 31 06/22/2019 0251   ACIDBASEDEF 1.6 07/27/2011 2058   O2SAT 96.0 07/27/2011 2058     Coagulation Profile: Recent Labs   Lab 12/20/2019 2012  INR 1.1    Cardiac Enzymes: No results for input(s): CKTOTAL, CKMB, CKMBINDEX, TROPONINI in the last 168 hours.  HbA1C: Hemoglobin A1C  Date/Time Value Ref Range Status  12/08/2014 01:52 PM 5.3  Final   Hgb A1c MFr Bld  Date/Time Value Ref Range Status  07/27/2011 05:44 PM 5.3 <5.7 % Final    Comment:    (NOTE)                                                                       According to the ADA Clinical Practice Recommendations for 2011, when HbA1c is used as a screening test:  >=6.5%   Diagnostic of Diabetes Mellitus           (if abnormal result is confirmed) 5.7-6.4%   Increased risk of developing Diabetes Mellitus References:Diagnosis and  Classification of Diabetes Mellitus,Diabetes IWLN,9892,11(HERDE 1):S62-S69 and Standards of Medical Care in         Diabetes - 2011,Diabetes YCXK,4818,56 (Suppl 1):S11-S61.  05/14/2010 10:09 PM  <5.7 % Final   5.2 (NOTE)                                                                       According to the ADA Clinical Practice Recommendations for 2011, when HbA1c is used as a screening test:   >=6.5%   Diagnostic of Diabetes Mellitus           (if abnormal result  is confirmed)  5.7-6.4%   Increased risk of developing Diabetes Mellitus  References:Diagnosis and Classification of Diabetes Mellitus,Diabetes DJSH,7026,37(CHYIF 1):S62-S69 and Standards of Medical Care in         Diabetes - 2011,Diabetes OYDX,4128,78  (Suppl 1):S11-S61.    CBG: Recent Labs  Lab 01/10/20 0806 01/10/20 0822  GLUCAP 27* 95    Review of Systems:   na  Past Medical History  He,  has a past medical history of Alcohol abuse, Alcohol related seizure ([place]), Alcoholic hepatitis, Allergy, Cirrhosis of liver ([place]), Colon polyps (06/2016), Hiccups, HTN (hypertension), Hyperlipidemia, and Seizures ([place]) (03/10/2014).   Surgical History    Past Surgical History:  Procedure Laterality Date  . COLONOSCOPY W/ POLYPECTOMY  06/2016   Dr.  Wilfrid Lund.  4 tubular adenomatous polyps removed, pandiverticulosis.  . NO PAST SURGERIES       Social History   reports that he has been smoking cigarettes. He has a 11.10 pack-year smoking history. He has never used smokeless tobacco. He reports current alcohol use of about 88.0 standard drinks of alcohol per week. He reports that he does not use drugs.   Family History   His family history includes Hypertension in his mother. There is no history of Colon cancer, Esophageal cancer, Rectal cancer, or Stomach cancer.   Allergies No Known Allergies   Home Medications  Prior to Admission medications   Medication Sig Start Date End Date Taking? Authorizing Provider  diltiazem (CARDIZEM CD) 120 MG 24 hr capsule Take 1 capsule (120 mg total) by mouth daily. 01/23/19 01/23/20 Yes Helberg, Larkin Ina, MD  fenofibrate (TRICOR) 145 MG tablet Take 1 tablet (145 mg total) by mouth daily. IM program 01/23/19  Yes Ina Homes, MD  levETIRAcetam (KEPPRA) 500 MG tablet Take 1 tablet (500 mg total) by mouth 2 (two) times daily. 08/28/19  Yes Recardo Evangelist, PA-C  naltrexone (DEPADE) 50 MG tablet Take 1 tablet (50 mg total) by mouth daily. IM Program. 01/23/19  Yes Ina Homes, MD  phenytoin (DILANTIN) 300 MG ER capsule Take 1 capsule (300 mg total) by mouth daily. 08/28/19  Yes Recardo Evangelist, PA-C     Critical care time: 33 min   Richardson Landry Minor ACNP Acute Care Nurse Practitioner [facility] Pulmonary/Critical Care Please consult Amion 01/10/2020, 8:25 AM      PCCM:   54 yo M, suffered cardiac arrest this AM, was found to have CBG of 28 treated with d50. He has multiple medical problems to include cirrhosis and chronic alcohol abuse.   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 This was completed through the application of advanced monitoring technologies and extensive interpretation of multiple databases. During this encounter critical care time was devoted to patient care services described in this note for 35 minutes.  Garner Nash, DO [facility] 01/10/2020 1:02 PM

## 2020-01-10 NOTE — Progress Notes (Signed)
Pt transferred from 5M19 to 3M08 via bag ventilation. Pt remained stable throughout transport. Onalee Hua, RRT RCP at bedside.

## 2020-01-11 ENCOUNTER — Inpatient Hospital Stay (HOSPITAL_COMMUNITY): Payer: Medicaid Other

## 2020-01-11 DIAGNOSIS — Z515 Encounter for palliative care: Secondary | ICD-10-CM

## 2020-01-11 DIAGNOSIS — Z66 Do not resuscitate: Secondary | ICD-10-CM

## 2020-01-11 DIAGNOSIS — R579 Shock, unspecified: Secondary | ICD-10-CM

## 2020-01-11 DIAGNOSIS — Z7189 Other specified counseling: Secondary | ICD-10-CM

## 2020-01-11 DIAGNOSIS — I469 Cardiac arrest, cause unspecified: Secondary | ICD-10-CM

## 2020-01-11 LAB — COMPREHENSIVE METABOLIC PANEL
ALT: 188 U/L — ABNORMAL HIGH (ref 0–44)
AST: 605 U/L — ABNORMAL HIGH (ref 15–41)
Albumin: 3.5 g/dL (ref 3.5–5.0)
Alkaline Phosphatase: 299 U/L — ABNORMAL HIGH (ref 38–126)
Anion gap: 16 — ABNORMAL HIGH (ref 5–15)
BUN: 58 mg/dL — ABNORMAL HIGH (ref 6–20)
CO2: 18 mmol/L — ABNORMAL LOW (ref 22–32)
Calcium: 7.7 mg/dL — ABNORMAL LOW (ref 8.9–10.3)
Chloride: 100 mmol/L (ref 98–111)
Creatinine, Ser: 3.14 mg/dL — ABNORMAL HIGH (ref 0.61–1.24)
GFR, Estimated: 23 mL/min — ABNORMAL LOW (ref >60)
Glucose, Bld: 143 mg/dL — ABNORMAL HIGH (ref 70–99)
Potassium: 4.7 mmol/L (ref 3.5–5.1)
Sodium: 134 mmol/L — ABNORMAL LOW (ref 135–145)
Total Bilirubin: 4.2 mg/dL — ABNORMAL HIGH (ref 0.3–1.2)
Total Protein: 5.5 g/dL — ABNORMAL LOW (ref 6.5–8.1)

## 2020-01-11 LAB — CBC WITH DIFFERENTIAL/PLATELET
Abs Immature Granulocytes: 0.08 10*3/uL — ABNORMAL HIGH (ref 0.00–0.07)
Basophils Absolute: 0 10*3/uL (ref 0.0–0.1)
Basophils Relative: 0 %
Eosinophils Absolute: 0 10*3/uL (ref 0.0–0.5)
Eosinophils Relative: 0 %
HCT: 32 % — ABNORMAL LOW (ref 39.0–52.0)
Hemoglobin: 10.9 g/dL — ABNORMAL LOW (ref 13.0–17.0)
Immature Granulocytes: 1 %
Lymphocytes Relative: 3 %
Lymphs Abs: 0.3 10*3/uL — ABNORMAL LOW (ref 0.7–4.0)
MCH: 35.5 pg — ABNORMAL HIGH (ref 26.0–34.0)
MCHC: 34.1 g/dL (ref 30.0–36.0)
MCV: 104.2 fL — ABNORMAL HIGH (ref 80.0–100.0)
Monocytes Absolute: 0.7 10*3/uL (ref 0.1–1.0)
Monocytes Relative: 5 %
Neutro Abs: 11.8 10*3/uL — ABNORMAL HIGH (ref 1.7–7.7)
Neutrophils Relative %: 91 %
Platelets: 152 10*3/uL (ref 150–400)
RBC: 3.07 MIL/uL — ABNORMAL LOW (ref 4.22–5.81)
RDW: 16 % — ABNORMAL HIGH (ref 11.5–15.5)
WBC: 12.8 10*3/uL — ABNORMAL HIGH (ref 4.0–10.5)
nRBC: 2.5 % — ABNORMAL HIGH (ref 0.0–0.2)

## 2020-01-11 LAB — GLUCOSE, CAPILLARY
Glucose-Capillary: 129 mg/dL — ABNORMAL HIGH (ref 70–99)
Glucose-Capillary: 128 mg/dL — ABNORMAL HIGH (ref 70–99)
Glucose-Capillary: 164 mg/dL — ABNORMAL HIGH (ref 70–99)

## 2020-01-11 LAB — HEMOGLOBIN A1C
Hgb A1c MFr Bld: 4.7 % — ABNORMAL LOW (ref 4.8–5.6)
Mean Plasma Glucose: 88.19 mg/dL

## 2020-01-11 LAB — PHOSPHORUS: Phosphorus: 6.9 mg/dL — ABNORMAL HIGH (ref 2.5–4.6)

## 2020-01-11 LAB — LACTIC ACID, PLASMA
Lactic Acid, Venous: 2 mmol/L (ref 0.5–1.9)
Lactic Acid, Venous: 1.7 mmol/L (ref 0.5–1.9)

## 2020-01-11 LAB — MAGNESIUM: Magnesium: 1.4 mg/dL — ABNORMAL LOW (ref 1.7–2.4)

## 2020-01-11 LAB — MRSA PCR SCREENING: MRSA by PCR: NEGATIVE

## 2020-01-11 MED ORDER — RIFAXIMIN 200 MG PO TABS
400.0000 mg | ORAL_TABLET | Freq: Three times a day (TID) | ORAL | Status: DC
  Filled 2020-01-11 (×2): qty 2

## 2020-01-11 MED ORDER — GLYCOPYRROLATE 1 MG PO TABS: 1.0000 mg | ORAL_TABLET | ORAL | Status: DC | PRN

## 2020-01-11 MED ORDER — MORPHINE 100MG IN NS 100ML (1MG/ML) PREMIX INFUSION
0.0000 mg/h | INTRAVENOUS | Status: DC
  Administered 2020-01-11: 5 mg/h via INTRAVENOUS
  Filled 2020-01-11: qty 100

## 2020-01-11 MED ORDER — MORPHINE SULFATE (PF) 2 MG/ML IV SOLN: 2.0000 mg | INTRAVENOUS | Status: DC | PRN

## 2020-01-11 MED ORDER — ACETAMINOPHEN 325 MG PO TABS: 650.0000 mg | ORAL_TABLET | Freq: Four times a day (QID) | ORAL | Status: DC | PRN

## 2020-01-11 MED ORDER — GLYCOPYRROLATE 0.2 MG/ML IJ SOLN: 0.2000 mg | INTRAMUSCULAR | Status: DC | PRN

## 2020-01-11 MED ORDER — MIDAZOLAM HCL 2 MG/2ML IJ SOLN: 2.0000 mg | INTRAMUSCULAR | Status: DC | PRN

## 2020-01-11 MED ORDER — DIPHENHYDRAMINE HCL 50 MG/ML IJ SOLN: 25.0000 mg | INTRAMUSCULAR | Status: DC | PRN

## 2020-01-11 MED ORDER — SODIUM BICARBONATE 8.4 % IV SOLN
INTRAVENOUS | Status: DC
  Filled 2020-01-11: qty 850

## 2020-01-11 MED ORDER — MAGNESIUM SULFATE 4 GM/100ML IV SOLN
4.0000 g | Freq: Once | INTRAVENOUS | Status: AC
  Administered 2020-01-11: 4 g via INTRAVENOUS
  Filled 2020-01-11: qty 100

## 2020-01-11 MED ORDER — DEXTROSE 5 % IV SOLN: INTRAVENOUS | Status: DC

## 2020-01-11 MED ORDER — INSULIN ASPART 100 UNIT/ML ~~LOC~~ SOLN
0.0000 [IU] | SUBCUTANEOUS | Status: DC
  Administered 2020-01-11: 1 [IU] via SUBCUTANEOUS

## 2020-01-11 MED ORDER — POLYVINYL ALCOHOL 1.4 % OP SOLN
1.0000 [drp] | Freq: Four times a day (QID) | OPHTHALMIC | Status: DC | PRN
  Filled 2020-01-11: qty 15

## 2020-01-11 MED ORDER — MAGNESIUM SULFATE 2 GM/50ML IV SOLN
2.0000 g | Freq: Once | INTRAVENOUS | Status: AC
  Administered 2020-01-11: 2 g via INTRAVENOUS
  Filled 2020-01-11: qty 50

## 2020-01-11 MED ORDER — MORPHINE BOLUS VIA INFUSION
5.0000 mg | INTRAVENOUS | Status: DC | PRN
  Administered 2020-01-11: 5 mg via INTRAVENOUS
  Filled 2020-01-11: qty 5

## 2020-01-11 MED ORDER — RIFAXIMIN 200 MG PO TABS
400.0000 mg | ORAL_TABLET | Freq: Three times a day (TID) | ORAL | Status: DC
  Filled 2020-01-11: qty 2

## 2020-01-11 MED ORDER — ACETAMINOPHEN 650 MG RE SUPP: 650.0000 mg | Freq: Four times a day (QID) | RECTAL | Status: DC | PRN

## 2020-01-13 LAB — ANTINUCLEAR ANTIBODIES, IFA: ANA Ab, IFA: NEGATIVE

## 2020-01-15 MED FILL — Medication: Qty: 1 | Status: AC

## 2020-01-15 NOTE — Addendum Note (Signed)
Addended by: Aldine Contes on: 01/29/20 11:39 AM   Modules accepted: Level of Service

## 2020-01-15 NOTE — Consult Note (Addendum)
Palliative Medicine Inpatient Consult Note  Reason for consult:  Goals of Care "cirrhosis, pea, aki, shock, MODS, likely not survivable"  HPI:  Per intake H&P --> 54 year old male who was admitted 01/08/2020 with chief complaints of lower extremity edema and liver dysfunction along with increasing renal dysfunction.  He is being aggressively diuresed and on the morning of 01/10/2020 at 075 3 hours he was found in PEA arrest.  CODE BLUE was initiated he received 1 amp of epi 1 amp bicarb 1 amp of D50 intubation per anesthesia with return of spontaneous circulation.  He has been noted to be hypertensive following code arrest.  He is transferred to the intensive care your pulmonary critical care is assumed his care. He is now intubated on pressor support.   Palliative care was asked to  Aid in goals of care conversations s/p PEA arrest.   Clinical Assessment/Goals of Care: I have reviewed medical records including EPIC notes, labs and imaging, received report from bedside RN, assessed the patient who is off sedation lying in bed intubated.    I met with Joseph Underwood (mother) and Joseph Underwood (sister) Joseph Underwood  to further discuss diagnosis prognosis, GOC, EOL wishes, disposition and options.   I introduced Palliative Medicine as specialized medical care for people living with serious illness. It focuses on providing relief from the symptoms and stress of a serious illness. The goal is to improve quality of life for both the patient and the family.  Joseph Underwood shares that Joseph Underwood has lived in [place] his whole life. He has never been married. He has three children one of whom recently passed away. 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 He got enjoyment out of spending time with his friends and family. He is a religious man and practices within the [facility] denomination.  Prior to hospitalization Joseph Underwood was living independently and able to complete bADLs for himself.   Discussed the injury that Joseph Underwood and  organs have suffered as a result of his arrest. Spoke about patients already poorly functioning liver to ETOH cirrhosis.Talked about dependency on the ventilator though FIO2 at 40%. Discussed his kidney function and how poor it is at this juncture as he is not producing urine - recommended against dialysis as this would not correct the multiple acute conditions that are contributing to patients deteriorating prognosis. We reviewed that Joseph Underwood is now off sedation and not able to engage in meaningful movements.  We talked about how his baseline health care was quite poor. Many of his family members did not realize how poorly his health was until now.   A detailed discussion was had today regarding advanced directives - patient does not have any on file though his mother is present and would act as his advocate.    Concepts specific to code status, artifical feeding and hydration, continued IV antibiotics and rehospitalization was had. I strongly recommended DNR if Joseph Underwood had another event where his heart stopped as we are unlikely to make further improvements from here. His mother, Joseph Underwood was in agreement with this. I shared that we would likely cause much more burden than benefit at this point.    The difference between a aggressive medical intervention path  and a palliative comfort care path for this patient at this time was had. I stated that we have two choices to continue doing what we are doing and hope for improvements which are not presently deemed to be great. We talked about [place] quality of life and if  lying in a bed would be acceptable to him for the rest of his life which his family did not think it would be. We then talked about comfort focused care whereby we would optimize symptom management in the time Joseph Underwood has left.   Discussed the importance of continued conversation with family and their  medical providers regarding overall plan of care and treatment options, ensuring decisions are within  the context of the patients values and GOCs.  Provided "Hard Choices for Loving People" booklet.  _________________________________________________________________________ Addendum: Dr. Valeta Harms met with patients family after myself. They made the decision to withdraw care.   Decision Maker: Joseph Underwood (mother) [phone]  SUMMARY OF RECOMMENDATIONS   DNAR  Comfort care per primary team orders - Compassionate extubation  Liberalize visitation policy  Appreciate Chaplain support  Code Status/Advance Care Planning: DNAR   Palliative Prophylaxis:   Oral Care, Turn Q2H, ROM  Additional Recommendations (Limitations, Scope, Preferences):  Comfort Care  Psycho-social/Spiritual:   Desire for further Chaplaincy support: Yes  Additional Recommendations: Education on end of life care   Prognosis: Poor in the setting of multiple organ failure anticipate hours to days  Discharge Planning: Discharge will be celestial  Vitals:   2020/01/17 1000 01-17-2020 1132  BP: 103/83   Pulse:    Resp: 18   Temp:  (!) 96.9 F (36.1 C)  SpO2:  98%    Intake/Output Summary (Last 24 hours) at 01-17-2020 1145 Last data filed at 01-17-2020 1000 Gross per 24 hour  Intake 2274.87 ml  Output 300 ml  Net 1974.87 ml   Last Weight  Most recent update: 01/17/2020  5:08 AM   Weight  57.7 kg (127 lb 3.3 oz)           Gen:  AA M intubated and somnolent HEENT: Intubated, OG tube in place, dry mucous membranes CV: Irregular rate and  regular rhythm  PULM: Vent support ABD: Distended EXT: No edema  Neuro: Somnolent - not making meaningful movements  PPS: 10%   This conversation/these recommendations were discussed with patient primary care team, Dr. Valeta Harms  Time In: 1200 Time Out: 1310 Total Time: 70 Greater than 50%  of this time was spent counseling and coordinating care related to the above assessment and plan.  [facility] Team Team Cell  Phone: [phone] Please utilize secure chat with additional questions, if there is no response within 30 minutes please call the above phone number  Palliative Medicine Team providers are available by phone from 7am to 7pm daily and can be reached through the team cell phone.  Should this patient require assistance outside of these hours, please call the patient's attending physician.

## 2020-01-15 NOTE — Progress Notes (Signed)
Patient transitioned to comfort care per family's wishes with family at bedside. Patient passed away at 1614 with this RN at bedside. Dr. Valeta Harms notified as well as patient's brother, Lady Deutscher. Mr. Magdalene Molly stated that he would notify the patient's mother as well as other family members.

## 2020-01-15 NOTE — Progress Notes (Signed)
Patient started on tube feeds on 01/10/2020. Patient began showing possible signs of intolerance with distention. Gastric residual checked was 375 mL. MD made aware. Tube feeds are to be continued per MD. Will continue to monitor for any additional signs of intolerance.   Antonieta Pert, RN  02/06/20 [phone]

## 2020-01-15 NOTE — Progress Notes (Signed)
[facility] Progress Note Patient Name: Jakell E Martinique DOB: 04-27-1965 MRN: [id number]   Date of Service  2020/02/09  HPI/Events of Note  Hyperglycemia. RN request for SSI. Patient is on tube feeding at 55cc/hr at this time.   eICU Interventions  Very sensitive SSI ordered due to earlier hypoglycemia in this patient. No Lantus ordered.      Intervention Category Intermediate Interventions: Hyperglycemia - evaluation and treatment  Charlott Rakes 02/09/2020, 1:09 AM

## 2020-01-15 NOTE — Progress Notes (Signed)
Interventional Radiology Brief Note:  IR consulted for possible percutaneous cholecystostomy tube placement in patient with gall bladder wall thickening on prior US 11/24.  HIDA was planned, however patient suffered PEA arrest with acute change in status and continued decline.  Reassessment with [place] planned for today with consideration of percutaneous cholecystostomy tube if indicated, however based on ongoing multisystem organ failure and concern for prognosis family has decided to transition to comfort care following meet with Palliative care team today.   Please reconsult IR if GOC change.   Brynda Greathouse, MS RD PA-C 2:29 PM

## 2020-01-15 NOTE — Progress Notes (Signed)
PCCM:  I met with extensive family at bedside. Decision was made for withdrawal of care and comfort care transition.   Orders placed.   Garner Nash, DO [facility] 08-Feb-2020 1:31 PM

## 2020-01-15 NOTE — Progress Notes (Signed)
[facility] Progress Note Patient Name: Joseph Underwood DOB: 07-26-1965 MRN: [id number]   Date of Service  02/03/20  HPI/Events of Note  Gastric residual 375cc. Abd distended. RN asks about pausing tube feeds.  Patient has cirrhosis. Abdominal distension may be due to ascites.   eICU Interventions  Continue tube feeds at this time.     Intervention Category Minor Interventions: Other:  Joseph Underwood 02/03/2020, 3:17 AM

## 2020-01-15 NOTE — Progress Notes (Signed)
Situation: RN Verdene Lennert paged Chaplain for pt Joseph Underwood as pt's family will be "withdrawing care" later today and was requesting prayer.  Background: Facts: RN shared that family had decided to withdraw care for pt Joseph Underwood. Family: Much family was present at today's visit, including his brother and 2 sisters, mother, and others. Feelings: Family expressed sadness and acceptance. Faith: Family requested prayer.  Actions & Assessments: Chaplain offered prayer and compassionate presence.  Recommendations: For Chaplains: continue to provide support, transitioning to grief support as needed. For Staff: Chaplain remains available for follow-up spiritual/emotional support as needed.   Rev. Susanne Borders, MDiv     2020-01-21 1300  Clinical Encounter Type  Visited With Patient and family together  Visit Type Initial;Spiritual support;Other (Comment) (family withdrawing care)  Referral From Nurse

## 2020-01-15 NOTE — Progress Notes (Signed)
Internal Medicine Clinic Attending  Case discussed with Dr. Masoudi  At the time of the visit.  We reviewed the resident's history and exam and pertinent patient test results.  I agree with the assessment, diagnosis, and plan of care documented in the resident's note.  

## 2020-01-15 NOTE — Progress Notes (Signed)
eLink Physician-Brief Progress Note Patient Name: Joseph Underwood DOB: August 28, 1965 MRN: [id number]   Date of Service  02/06/20  HPI/Events of Note  Hypomagnesemia.  eICU Interventions  Magnesium sulfate 2g IV ordered.     Intervention Category Minor Interventions: Electrolytes abnormality - evaluation and management  Marily Lente Steele Stracener 02-06-20, 4:55 AM

## 2020-01-15 NOTE — Plan of Care (Signed)
  Problem: Health Behavior/Discharge Planning: Goal: Ability to manage health-related needs will improve Outcome: Progressing   Problem: Clinical Measurements: Goal: Will remain free from infection Outcome: Progressing Goal: Diagnostic test results will improve Outcome: Progressing Goal: Respiratory complications will improve Outcome: Progressing Goal: Cardiovascular complication will be avoided Outcome: Progressing   Problem: Coping: Goal: Level of anxiety will decrease Outcome: Progressing   Problem: Elimination: Goal: Will not experience complications related to bowel motility Outcome: Progressing Goal: Will not experience complications related to urinary retention Outcome: Progressing   Problem: Pain Managment: Goal: General experience of comfort will improve Outcome: Progressing   Problem: Safety: Goal: Ability to remain free from injury will improve Outcome: Progressing   Problem: Education: Goal: Knowledge of General Education information will improve Description: Including pain rating scale, medication(s)/side effects and non-pharmacologic comfort measures Outcome: Progressing   Problem: Clinical Measurements: Goal: Ability to maintain clinical measurements within normal limits will improve Outcome: Progressing   Problem: Activity: Goal: Risk for activity intolerance will decrease Outcome: Progressing   Problem: Nutrition: Goal: Adequate nutrition will be maintained Outcome: Progressing   Problem: Skin Integrity: Goal: Risk for impaired skin integrity will decrease Outcome: Progressing   Problem: Activity: Goal: Ability to tolerate increased activity will improve Outcome: Progressing   Problem: Respiratory: Goal: Ability to maintain a clear airway and adequate ventilation will improve Outcome: Progressing   Problem: Role Relationship: Goal: Method of communication will improve Outcome: Progressing

## 2020-01-15 NOTE — Progress Notes (Addendum)
NAME:  Joseph Underwood, MRN:  [id number], DOB:  [birth year], LOS: 4 ADMISSION DATE:  12/27/2019, CONSULTATION DATE: 01/10/2020 REFERRING MD: Internal medicine teaching service, CHIEF COMPLAINT: Status post PEA arrest  Brief History   01/10/2020 suffered PEA arrest transferred to intensive care unit  History of present illness   54 year old male who was admitted 01/02/2020 with chief complaints of lower extremity edema and liver dysfunction along with increasing renal dysfunction.  He is being aggressively diuresed and on the morning of 01/10/2020 at 075 3 hours he was found in PEA arrest.  CODE BLUE was initiated he received 1 amp of epi 1 amp bicarb 1 amp of D50 intubation per anesthesia with return of spontaneous circulation.  He has been noted to be hypertensive following code arrest.  He is transferred to the intensive care your pulmonary critical care is assumed his care.  His mother was updated by phone on 01/10/2020 at 0830 hrs.  Past Medical History   Past Medical History:  Diagnosis Date  . Alcohol abuse   . Alcohol related seizure ([place])   . Alcoholic hepatitis   . Allergy   . Cirrhosis of liver ([place])    Assumed secondary to ETOH.  First noted on CTAP of 06/2018.  Marland Kitchen Colon polyps 06/2016   For polyps removed at colonoscopy.  All were tubular adenomas without high-grade dysplasia.  Marland Kitchen Hiccups   . HTN (hypertension)   . Hyperlipidemia   . Seizures ([place]) 03/10/2014     Significant Hospital Events   01/10/2020 PEA arrest  Consults:  01/10/2020 pulmonary critical care Cardiology  Procedures:  01/10/2020 intubation during code per anesthesia  Significant Diagnostic Tests:    Micro Data:   Antimicrobials:    Interim history/subjective:  54 year old admitted with alcohol abuse liver dysfunction you had a PEA arrest 01/10/2020 at 0753 requiring epinephrine, bicarb, D50 along with intubation by anesthesia.  Transferred to the intensive care unit ended pulmonary critical  care service  Objective   Blood pressure 102/84, pulse 97, temperature (!) 97 F (36.1 C), temperature source Axillary, resp. rate 18, height 5\' 5"  (1.651 m), weight 57.7 kg, SpO2 98 %.    Vent Mode: PRVC FiO2 (%):  [40 %-100 %] 40 % Set Rate:  [18 bmp] 18 bmp Vt Set:  [480 mL] 480 mL PEEP:  [5 cmH20] 5 cmH20 Pressure Support:  [5 cmH20] 5 cmH20 Plateau Pressure:  [19 cmH20-23 cmH20] 21 cmH20   Intake/Output Summary (Last 24 hours) at 2020-01-30 0813 Last data filed at Jan 30, 2020 0600 Gross per 24 hour  Intake 2003.47 ml  Output 0 ml  Net 2003.47 ml   Filed Weights   01/10/2020 1800 30-Jan-2020 0500  Weight: 56.2 kg 57.7 kg    Examination: General: Cachectic male who status post PEA arrest HENT: No JVD is appreciated, orotracheal tube is in place. Lungs: Coarse rhonchi bilaterally Cardiovascular: Distant but regular Abdomen: Mild distention Extremities: 2-3+ lower extremity edema Neuro: Some agonal breathing but otherwise unresponsive positive gag   Resolved Hospital Problem list     Assessment & Plan:  Ventilator dependent respiratory failure following PEA arrest 01/10/2020 at 0753.  Return of spontaneous circulation obtained status post epinephrine, bicarb, glucose. His mental status is not ready for extubation Ventilator bundle Weaning protocol EEG negative for seizure activity, suggestive of moderate to severe diffuse encephahopathy of nonspecific etiology but likely related to sedation. CT head negative for acute intracranial abnotmality  Status post PEA arrest was hypertensive Now on on Levophed for pressure  control Wean pressors to maintain MAP >65 Cardiology is following TEE in future Confirmed proper electrolyte balance  Liver dysfunction in the setting of alcohol abuse Continue to monitor liver function LFTS increasing, MELD 32 Monitor ammonia Continue Lactulose to achieve 2-3 BM daily Would consider restarting Diuretic when appropriate  Acute renal  insufficiency Lab Results Component Value Date  CREATININE 3.14 (H) 14-Jan-2020  CREATININE 2.75 (H) 01/10/2020  CREATININE 1.91 (H) 01/09/2020  CREATININE 0.72 07/10/2014  CREATININE 0.65 06/13/2014  CREATININE 1.05 04/15/2014 Monitor daily No urine output 24 hrs, likely due to hypotension s/p arrest Difficult to bladder scan given distended abdomen Straight cath for 300 cc urine Consider renal u/s to r/o obstruction  Prolonged alcohol abuse and presumed withdrawal CIWA protocol IV sedation for tube tolerance Alcohol cessation If survives follow-up outpatient counseling for alcohol abuse  Best practice (evaluated daily)   Diet: NPO Pain/Anxiety/Delirium protocol (if indicated): As needed VAP protocol (if indicated): In place DVT prophylaxis: In place GI prophylaxis: PPI Glucose control: Sliding scale insulin protocol Mobility: Dressed last date of multidisciplinary goals of care discussion 01/10/2020 Family and staff present family not present, but mother was updated by phone 01/10/2020.  Multiple nurses at the bedside along with internal medicine teaching service Summary of discussion status post PEA arrest successful return of spontaneous circulation transferred to the intensive care unit Follow up goals of care discussion due in 1 week Code Status: Full Disposition: Transfer to intensive care unit  Labs   CBC: Recent Labs  Lab 01/10/2020 2012 01/08/20 0314 01/09/20 0857 01/09/20 0857 01/10/20 0220 01/10/20 1008 01/10/20 1009 01/10/20 1045 2020-01-14 0239  WBC 6.5   < > 7.8  --  13.5* 10.1 10.6*  --  12.8*  NEUTROABS 4.8  --   --   --   --   --   --   --  11.8*  HGB 11.8*   < > 11.8*   < > 12.8* 11.9* 11.6* 12.9* 10.9*  HCT 33.6*   < > 34.3*   < > 38.9* 35.7* 36.1* 38.0* 32.0*  MCV 102.1*   < > 103.9*  --  108.7* 110.2* 109.4*  --  104.2*  PLT 165   < > 155  --  155 154 147*  --  152   < > = values in this interval not displayed.    Basic Metabolic  Panel: Recent Labs  Lab 12/16/2019 2012 12/18/2019 2012 01/08/20 0314 01/09/20 0857 01/10/20 1008 01/10/20 1045 01/10/20 1815 Jan 14, 2020 0239  NA 132*   < > 132* 131* 133* 131*  --  134*  K 4.0   < > 4.2 4.8 4.2 4.1  --  4.7  CL 100  --  101 97* 96*  --   --  100  CO2 18*  --  18* 20* 12*  --   --  18*  GLUCOSE 75  --  92 92 131*  --   --  143*  BUN 28*  --  31* 36* 48*  --   --  58*  CREATININE 1.34*  --  1.34* 1.91* 2.75*  --   --  3.14*  CALCIUM 8.2*  --  8.4* 8.3* 7.8*  --   --  7.7*  MG  --   --   --   --  1.7  --  1.3* 1.4*  PHOS  --   --   --   --  9.5*  --  7.5* 6.9*   < > =  values in this interval not displayed.   GFR: Estimated Creatinine Clearance: 21.9 mL/min (A) (by C-G formula based on SCr of 3.14 mg/dL (H)). Recent Labs  Lab 01/10/20 0220 01/10/20 1008 01/10/20 1009 01/10/20 1815 31-Jan-2020 0239  WBC 13.5* 10.1 10.6*  --  12.8*  LATICACIDVEN  --  10.3*  --  6.6*  --     Liver Function Tests: Recent Labs  Lab 01/09/2020 2012 01/08/20 0314 01/09/20 0857 01/10/20 1008 2020-01-31 0239  AST 168* 184* 115* 420* 605*  ALT 126* 119* 103* 149* 188*  ALKPHOS 448* 425* 434* 407* 299*  BILITOT 3.7* 3.7* 5.7* 6.7* 4.2*  PROT 5.5* 5.3* 5.2* 5.2* 5.5*  ALBUMIN 2.7* 2.6* 2.6* 2.4* 3.5   No results for input(s): LIPASE, AMYLASE in the last 168 hours. Recent Labs  Lab 01/10/20 1009  AMMONIA 123*    ABG    Component Value Date/Time   PHART 7.264 (L) 01/10/2020 1045   PCO2ART 28.2 (L) 01/10/2020 1045   PO2ART 251 (H) 01/10/2020 1045   HCO3 12.9 (L) 01/10/2020 1045   TCO2 14 (L) 01/10/2020 1045   ACIDBASEDEF 13.0 (H) 01/10/2020 1045   O2SAT 100.0 01/10/2020 1045     Coagulation Profile: Recent Labs  Lab 12/29/2019 2012 01/10/20 1009  INR 1.1 2.1*    Cardiac Enzymes: No results for input(s): CKTOTAL, CKMB, CKMBINDEX, TROPONINI in the last 168 hours.  HbA1C: Hemoglobin A1C  Date/Time Value Ref Range Status  12/08/2014 01:52 PM 5.3  Final   Hgb A1c MFr  Bld  Date/Time Value Ref Range Status  Jan 31, 2020 02:39 AM 4.7 (L) 4.8 - 5.6 % Final    Comment:    (NOTE) Pre diabetes:          5.7%-6.4%  Diabetes:              >6.4%  Glycemic control for   <7.0% adults with diabetes   07/27/2011 05:44 PM 5.3 <5.7 % Final    Comment:    (NOTE)                                                                       According to the ADA Clinical Practice Recommendations for 2011, when HbA1c is used as a screening test:  >=6.5%   Diagnostic of Diabetes Mellitus           (if abnormal result is confirmed) 5.7-6.4%   Increased risk of developing Diabetes Mellitus References:Diagnosis and Classification of Diabetes Mellitus,Diabetes [id number],7793,90(ZESPQ 1):S62-S69 and Standards of Medical Care in         Diabetes - 2011,Diabetes [id number],7622,63 (Suppl 1):S11-S61.    CBG: Recent Labs  Lab 01/10/20 1523 01/10/20 1941 01/10/20 2354 01/31/2020 0313 Jan 31, 2020 0802  GLUCAP 131* 159* 192* 129* 128*    Review of Systems:   na  Past Medical History  He,  has a past medical history of Alcohol abuse, Alcohol related seizure ([place]), Alcoholic hepatitis, Allergy, Cirrhosis of liver ([place]), Colon polyps (06/2016), Hiccups, HTN (hypertension), Hyperlipidemia, and Seizures ([place]) (03/10/2014).   Surgical History    Past Surgical History:  Procedure Laterality Date  . COLONOSCOPY W/ POLYPECTOMY  06/2016   Dr. Wilfrid Lund.  4 tubular adenomatous polyps removed, pandiverticulosis.  . NO PAST SURGERIES  Social History   reports that he has been smoking cigarettes. He has a 11.10 pack-year smoking history. He has never used smokeless tobacco. He reports current alcohol use of about 88.0 standard drinks of alcohol per week. He reports that he does not use drugs.   Family History   His family history includes Hypertension in his mother. There is no history of Colon cancer, Esophageal cancer, Rectal cancer, or Stomach cancer.   Allergies No Known Allergies    Home Medications  Prior to Admission medications   Medication Sig Start Date End Date Taking? Authorizing Provider  diltiazem (CARDIZEM CD) 120 MG 24 hr capsule Take 1 capsule (120 mg total) by mouth daily. 01/23/19 01/23/20 Yes Helberg, Larkin Ina, MD  fenofibrate (TRICOR) 145 MG tablet Take 1 tablet (145 mg total) by mouth daily. IM program 01/23/19  Yes Ina Homes, MD  levETIRAcetam (KEPPRA) 500 MG tablet Take 1 tablet (500 mg total) by mouth 2 (two) times daily. 08/28/19  Yes Recardo Evangelist, PA-C  naltrexone (DEPADE) 50 MG tablet Take 1 tablet (50 mg total) by mouth daily. IM Program. 01/23/19  Yes Ina Homes, MD  phenytoin (DILANTIN) 300 MG ER capsule Take 1 capsule (300 mg total) by mouth daily. 08/28/19  Yes Recardo Evangelist, PA-C     Carollee Leitz, MD [facility] Res[facility]      PCCM:  Patient seen in conjunction with Dr. Volanda Napoleon, [place] Resident   53 yo M, alcoholic cirrhosis, alcoholic CM, admitted with concern for abnormal liver enzymes, possible acute cholecysitis. Seen by surgery and GI. Recommending possible HIDA.  Unfortunately, suffered a PEA cardiac arrest on floor. Now with progressive shock and vasoplegic like state on high dose pressors, no UOP, metabolic acidosis and renal failure  BP 94/82   Pulse 98   Temp (!) 97 F (36.1 C) (Axillary)   Resp 18   Ht 5\' 5"  (1.651 m)   Wt 57.7 kg   SpO2 98%   BMI 21.17 kg/m   Gen: cachetic male, resting in bed  HENT: not tracking, no following commands Neuro: no response to pain  Heart: RRR, s1 s2  Lungs: BL vented breaths  Abd: distended  Ext: no edema   Labs reviewed  Worsening SCR  No UOP  A:  Multiorgan failure  Shock, SIRS response post arrest, possible sepsis from cholecystitis  Acute renal failure, no acute indication for RRT at this time, K stable  Acute metabolic encephalopathy  Acute hepatic encephalopathy Alcohol cirrhosis Alcoholic cardiomyopathy  S/p PEA cardiac arrest  Lactic acidosis,  will take time to clear   P: I will speak with IR about perc drain for GB  Repeat RUQ [place], if GB more distended we can consider drain or shock state worse  Discussed with Dr. Serafina Royals  Continue abx  NEPI and vaso to maintain MAP >74mmHg  Albumin support for kidney  Start bicarb infusion   Consulted palliative care. Overall prognosis and recovery from this is poor  I called and spoke with the patients sister.   Garner Nash, DO [facility] Jan 30, 2020 10:05 AM

## 2020-01-15 DEATH — deceased

## 2020-02-15 NOTE — Death Summary Note (Signed)
DEATH SUMMARY   Patient Details  Name: Joseph Underwood MRN: [id number] DOB: [birth year]  Admission/Discharge Information   Admit Date:  01/09/20  Date of Death: Date of Death: 2020-01-13  Time of Death: Time of Death: 03/18/12  Length of Stay: 4  Referring Physician: [name], MD   Reason(s) for Hospitalization  55 year old male who was admitted 01-09-2020 with chief complaints of lower extremity edema and liver dysfunction along with increasing renal dysfunction.  He is being aggressively diuresed and on the morning of 01/10/2020 at 075 3 hours he was found in PEA arrest.  CODE BLUE was initiated he received 1 amp of epi 1 amp bicarb 1 amp of D50 intubation per anesthesia with return of spontaneous circulation.  He has been noted to be hypertensive following code arrest.  He is transferred to the intensive care your pulmonary critical care is assumed his care.  His mother was updated by phone on 01/10/2020 at 0830 hrs.  Diagnoses  Preliminary cause of death: Hypoglycemia Secondary Diagnoses (including complications and co-morbidities):  Active Problems:   Alcohol use disorder, moderate, dependence (HCC)   Malnutrition of moderate degree (HCC)   Cirrhosis (HCC)   Goals of care, counseling/discussion   Epilepsy ([place])   Volume overload   Lower extremity edema   Macrocytic anemia   PEA (Pulseless electrical activity) ([place])   Palliative care by specialist   DNR (do not resuscitate)   Brief Hospital Course (including significant findings, care, treatment, and services provided and events leading to death)  Joseph Underwood is a 55 y.o. year old male who 55 year old male who was admitted January 09, 2020 with chief complaints of lower extremity edema and liver dysfunction along with increasing renal dysfunction.  He is being aggressively diuresed and on the morning of 01/10/2020 at 075 3 hours he was found in PEA arrest.  CODE BLUE was initiated he received 1 amp of epi 1 amp bicarb 1 amp of D50  intubation per anesthesia with return of spontaneous circulation.  He has been noted to be hypertensive following code arrest.  He is transferred to the intensive care your pulmonary critical care is assumed his care.  His mother was updated by phone on 01/10/2020 at 0830 hrs.  Patient on admission had imaging with concern of cholecystitis and gallstones with mild gallbladder wall thickening.  He was being worked up by surgery as well as GI.  He had increasing liver function enzymes, transaminitis and elevated alk phos.  Early in the morning patient was found to be hypoglycemic suffered a PEA cardiac arrest transferred to the ICU.  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  With multiple failing organ systems we discussed goals of care with the patient's family.  They made the decision for withdrawal of care and palliative liberation from mechanical support.    Pertinent Labs and Studies  Significant Diagnostic Studies EEG  Result Date: 01/10/2020 Lora Havens, MD     01/10/2020  2:29 PM Patient Name: Joseph Underwood MRN: [id number] Epilepsy Attending: Lora Havens Referring Physician/Provider: Asa Saunas, NP Date: 01/10/2020 Duration: 26.35 mins Patient history: 55 year old male with history of epilepsy status post cardiac arrest.  EEG to evaluate for seizures. Level of alertness:  comatose AEDs during EEG study: Keppra, phenytoin Technical aspects: This EEG study was done with scalp electrodes positioned according to the 10-20 International system of electrode placement. Electrical activity was acquired at a sampling rate of 500Hz and reviewed with  a high frequency filter of 70Hz and a low frequency filter of 1Hz. EEG data were recorded continuously and digitally stored. Description: EEG showed continuous generalized 3 to 6 Hz theta-delta slowing as well as intermittent generalized 15-18hz beta activity. Hyperventilation and photic stimulation  were not performed.   ABNORMALITY -Continuous slow, generalized IMPRESSION: This study is suggestive of moderate to severe diffuse encephalopathy, nonspecific etiology but likely related to sedation. No seizures or epileptiform discharges were seen throughout the recording. Lora Havens   CT HEAD WO CONTRAST  Result Date: 01/10/2020 CLINICAL DATA:  Delirium. EXAM: CT HEAD WITHOUT CONTRAST TECHNIQUE: Contiguous axial images were obtained from the base of the skull through the vertex without intravenous contrast. COMPARISON:  08/22/2019 FINDINGS: Brain: There is no evidence of an acute infarct, intracranial hemorrhage, mass, midline shift, or extra-axial fluid collection. Ventriculomegaly has minimally increased and is attributed to ex vacuo dilatation from cerebral atrophy, greatly advanced for age. Hypodensities in the cerebral white matter bilaterally are unchanged and nonspecific but compatible with mild chronic small vessel ischemic disease. There is unchanged encephalomalacia inferiorly in the right temporal lobe. Vascular: Calcified atherosclerosis at the skull base. No hyperdense vessel. Skull: No fracture or suspicious osseous lesion. Sinuses/Orbits: Mild mucosal thickening in the included portion of the right maxillary sinus. Clear mastoid air cells. Unremarkable appearance of the included portion of the orbits. Other: None. IMPRESSION: 1. No evidence of acute intracranial abnormality. 2. Advanced cerebral atrophy and mild chronic small vessel ischemic disease. 3. Longstanding right temporal lobe encephalomalacia compatible with remote trauma. Electronically Signed   By: Logan Bores M.D.   On: 01/10/2020 13:57   DG Chest Port 1 View  Result Date: January 20, 2020 CLINICAL DATA:  Anti beta 2 EXAM: PORTABLE CHEST 1 VIEW COMPARISON:  01/10/2020 FINDINGS: Endotracheal tube tip measures 1.9 cm above the carina. Enteric tube tip is in the left upper quadrant consistent with location in the stomach. Left  central venous catheter with tip over the cavoatrial junction region. Cardiac enlargement. Developing linear atelectasis in the mid lungs. Pulmonary edema is improving. IMPRESSION: Improving pulmonary edema. Developing linear atelectasis in the mid lungs. Electronically Signed   By: Lucienne Capers M.D.   On: 01-20-2020 05:11   DG CHEST PORT 1 VIEW  Result Date: 01/10/2020 CLINICAL DATA:  Central line and enteric tube placement. EXAM: PORTABLE CHEST 1 VIEW COMPARISON:  Chest x-ray dated March 20, 2018. FINDINGS: Endotracheal tube tip 4 mm above the carina. Enteric tube tip entering the stomach with the tip in the fundus. Left internal jugular central venous catheter with tip at the cavoatrial junction. Cardiomegaly. Diffuse interstitial thickening with bilateral perihilar hazy opacities. No pleural effusion or pneumothorax. No acute osseous abnormality. IMPRESSION: 1. Endotracheal tube tip 4 mm above the carina. Recommend retraction 1-2 cm. 2. Appropriately positioned left internal jugular central venous catheter and enteric tube. 3. Mild pulmonary edema. Electronically Signed   By: Titus Dubin M.D.   On: 01/10/2020 09:23   ECHOCARDIOGRAM COMPLETE  Result Date: 01/08/2020    ECHOCARDIOGRAM REPORT   Patient Name:   Jakarri E Underwood Date of Exam: 01/08/2020 Medical Rec #:  [id number]     Height:       65.0 in Accession #:    [id number]    Weight:       123.9 lb Date of Birth:  06-26-1965    BSA:          1.614 m Patient Age:    52 years  BP:           118/95 mmHg Patient Gender: M             HR:           103 bpm. Exam Location:  Inpatient Procedure: 2D Echo Indications:    786.09 dyspnea  History:        Patient has prior history of Echocardiogram examinations, most                 recent 07/28/2011. Risk Factors:Hypertension, Dyslipidemia and                 Current Smoker. ETOH Abuse.  Sonographer:    Jannett Celestine RDCS (AE) Referring Phys: [id number] Raynaldo Opitz RAINES IMPRESSIONS  1. Left  ventricular ejection fraction, by estimation, is 30 to 35%. The left ventricle has moderately decreased function. The left ventricle demonstrates global hypokinesis. Left ventricular diastolic parameters were normal.  2. Right ventricular systolic function is moderately reduced. The right ventricular size is severely enlarged. There is mildly elevated pulmonary artery systolic pressure. The estimated right ventricular systolic pressure is 93.8 mmHg.  3. Left atrial size was moderately dilated.  4. A small pericardial effusion is present.  5. The mitral valve is normal in structure. Moderate mitral valve regurgitation. No evidence of mitral stenosis.  6. Tricuspid valve regurgitation is moderate to severe.  7. The aortic valve is tricuspid. There is mild calcification of the aortic valve. Aortic valve regurgitation is not visualized. No aortic stenosis is present.  8. Moderately dilated pulmonary artery.  9. The inferior vena cava is dilated in size with <50% respiratory variability, suggesting right atrial pressure of 15 mmHg. Comparison(s): Changes from prior study are noted. Conclusion(s)/Recommendation(s): EF now reduced to 30-35%. RV incompletely visualized but appears dilated, with moderate to severe TR. Also with moderate MR. Some ascites seen as well, and IVC dilated. Findings communicated with Dr. Rebeca Alert at 5:47 PM. FINDINGS  Left Ventricle: Left ventricular ejection fraction, by estimation, is 30 to 35%. The left ventricle has moderately decreased function. The left ventricle demonstrates global hypokinesis. The left ventricular internal cavity size was small. There is no left ventricular hypertrophy. Left ventricular diastolic parameters were normal. Right Ventricle: The right ventricular size is severely enlarged. Right vetricular wall thickness was not well visualized. Right ventricular systolic function is moderately reduced. There is mildly elevated pulmonary artery systolic pressure. The tricuspid  regurgitant velocity is 2.63 m/s, and with an assumed right atrial pressure of 15 mmHg, the estimated right ventricular systolic pressure is 10.1 mmHg. Left Atrium: Left atrial size was moderately dilated. Right Atrium: Right atrial size was not well visualized. Pericardium: A small pericardial effusion is present. Mitral Valve: The mitral valve is normal in structure. There is mild thickening of the mitral valve leaflet(s). Moderate mitral valve regurgitation. No evidence of mitral valve stenosis. Tricuspid Valve: The tricuspid valve is normal in structure. Tricuspid valve regurgitation is moderate to severe. Aortic Valve: The aortic valve is tricuspid. There is mild calcification of the aortic valve. Aortic valve regurgitation is not visualized. No aortic stenosis is present. Pulmonic Valve: The pulmonic valve was grossly normal. Pulmonic valve regurgitation is not visualized. No evidence of pulmonic stenosis. Aorta: The aortic root and ascending aorta are structurally normal, with no evidence of dilitation. Pulmonary Artery: The pulmonary artery is moderately dilated. Venous: The inferior vena cava is dilated in size with less than 50% respiratory variability, suggesting right atrial pressure of 15 mmHg. IAS/Shunts: The  atrial septum is grossly normal. Additional Comments: Moderate ascites is present.  LEFT VENTRICLE PLAX 2D LVIDd:         4.30 cm  Diastology LVIDs:         3.40 cm  LV e' medial:    9.03 cm/s LV PW:         1.00 cm  LV E/e' medial:  11.8 LV IVS:        1.10 cm  LV e' lateral:   9.57 cm/s LVOT diam:     2.00 cm  LV E/e' lateral: 11.2 LV SV:         17 LV SV Index:   11 LVOT Area:     3.14 cm  LEFT ATRIUM           Index LA diam:      3.70 cm 2.29 cm/m LA Vol (A2C): 73.9 ml 45.79 ml/m  AORTIC VALVE LVOT Vmax:   52.20 cm/s LVOT Vmean:  37.600 cm/s LVOT VTI:    0.055 m  AORTA Ao Root diam: 2.60 cm MITRAL VALVE                 TRICUSPID VALVE MV Area (PHT): 5.02 cm      TR Peak grad:   27.7 mmHg  MV Decel Time: 151 msec      TR Vmax:        263.00 cm/s MR Peak grad:    101.2 mmHg MR Mean grad:    57.0 mmHg   SHUNTS MR Vmax:         503.00 cm/s Systemic VTI:  0.06 m MR Vmean:        335.0 cm/s  Systemic Diam: 2.00 cm MR PISA:         4.02 cm MR PISA Eff ROA: 27 mm MR PISA Radius:  0.80 cm MV E velocity: 107.00 cm/s Buford Dresser MD Electronically signed by Buford Dresser MD Signature Date/Time: 01/08/2020/5:47:16 PM    Final    US Abdomen Limited RUQ (LIVER/GB)  Result Date: 01/08/2020 CLINICAL DATA:  Hepatic cirrhosis EXAM: ULTRASOUND ABDOMEN LIMITED RIGHT UPPER QUADRANT COMPARISON:  CT abdomen and pelvis January 06, 2019. FINDINGS: Gallbladder: Within the gallbladder, there are echogenic foci which move and shadow consistent with cholelithiasis. Largest gallstone measures 5 mm in length. The gallbladder wall is thickened and edematous with a degree of pericholecystic fluid. No sonographic Murphy sign noted by sonographer. Common bile duct: Diameter: 7 mm, upper normal. No intrahepatic or extrahepatic biliary duct dilatation. Liver: No focal lesion identified. Liver contour is nodular with diffuse increase in liver echogenicity. Portal vein is patent on color Doppler imaging with normal direction of blood flow towards the liver. Other: There is a cyst in the lower pole of the right kidney measuring 1.1 x 0.7 x 1.1 cm. IMPRESSION: 1. Cholelithiasis with edematous, thickened gallbladder wall and pericholecystic fluid. Concern for acute cholecystitis given this appearance. 2. Nodular liver contour with diffuse increase in liver echogenicity, findings concerning for hepatic cirrhosis. No focal liver lesions evident. It must be cautioned that the sensitivity of ultrasound for detection of focal liver lesions is diminished in this circumstance. 3.  Small cyst lower pole right kidney. These results will be called to the ordering clinician or representative by the Radiologist Assistant, and  communication documented in the PACS or [employer]. Electronically Signed   By: Lowella Grip III M.D.   On: 01/08/2020 13:39    Microbiology Recent Results (from the past 240  hour(s))  SARS Coronavirus 2 by RT PCR (hospital order, performed in [facility] hospital lab) Nasopharyngeal Nasopharyngeal Swab     Status: None   Collection Time: 01/10/2020  8:10 PM   Specimen: Nasopharyngeal Swab  Result Value Ref Range Status   SARS Coronavirus 2 NEGATIVE NEGATIVE Final    Comment: (NOTE) SARS-CoV-2 target nucleic acids are NOT DETECTED.  The SARS-CoV-2 RNA is generally detectable in upper and lower respiratory specimens during the acute phase of infection. The lowest concentration of SARS-CoV-2 viral copies this assay can detect is 250 copies / mL. A negative result does not preclude SARS-CoV-2 infection and should not be used as the sole basis for treatment or other patient management decisions.  A negative result may occur with improper specimen collection / handling, submission of specimen other than nasopharyngeal swab, presence of viral mutation(s) within the areas targeted by this assay, and inadequate number of viral copies (<250 copies / mL). A negative result must be combined with clinical observations, patient history, and epidemiological information.  Fact Sheet for Patients:   StrictlyIdeas.no  Fact Sheet for Healthcare Providers: BankingDealers.co.za  This test is not yet approved or  cleared by the [place] FDA and has been authorized for detection and/or diagnosis of SARS-CoV-2 by FDA under an Emergency Use Authorization (EUA).  This EUA will remain in effect (meaning this test can be used) for the duration of the COVID-19 declaration under Section 564(b)(1) of the Act, 21 U.S.C. section 360bbb-3(b)(1), unless the authorization is terminated or revoked sooner.  Performed at [facility], [place]  [address], [place], [place] [address]   Resp panel by RT-PCR (RSV, Flu A&B, Covid) Nasopharyngeal Swab     Status: None   Collection Time: 01/08/20  7:16 PM   Specimen: Nasopharyngeal Swab; Nasopharyngeal(NP) swabs in vial transport medium  Result Value Ref Range Status   SARS Coronavirus 2 by RT PCR NEGATIVE NEGATIVE Final    Comment: (NOTE) SARS-CoV-2 target nucleic acids are NOT DETECTED.  The SARS-CoV-2 RNA is generally detectable in upper respiratory specimens during the acute phase of infection. The lowest concentration of SARS-CoV-2 viral copies this assay can detect is 138 copies/mL. A negative result does not preclude SARS-Cov-2 infection and should not be used as the sole basis for treatment or other patient management decisions. A negative result may occur with  improper specimen collection/handling, submission of specimen other than nasopharyngeal swab, presence of viral mutation(s) within the areas targeted by this assay, and inadequate number of viral copies(<138 copies/mL). A negative result must be combined with clinical observations, patient history, and epidemiological information. The expected result is Negative.  Fact Sheet for Patients:  EntrepreneurPulse.com.au  Fact Sheet for Healthcare Providers:  IncredibleEmployment.be  This test is no t yet approved or cleared by the [place] FDA and  has been authorized for detection and/or diagnosis of SARS-CoV-2 by FDA under an Emergency Use Authorization (EUA). This EUA will remain  in effect (meaning this test can be used) for the duration of the COVID-19 declaration under Section 564(b)(1) of the Act, 21 U.S.C.section 360bbb-3(b)(1), unless the authorization is terminated  or revoked sooner.       Influenza A by PCR NEGATIVE NEGATIVE Final   Influenza B by PCR NEGATIVE NEGATIVE Final    Comment: (NOTE) The Xpert Xpress SARS-CoV-2/FLU/RSV plus assay is intended as an aid in  the diagnosis of influenza from Nasopharyngeal swab specimens and should not be used as a sole basis for treatment. Nasal  washings and aspirates are unacceptable for Xpert Xpress SARS-CoV-2/FLU/RSV testing.  Fact Sheet for Patients: EntrepreneurPulse.com.au  Fact Sheet for Healthcare Providers: IncredibleEmployment.be  This test is not yet approved or cleared by the [place] FDA and has been authorized for detection and/or diagnosis of SARS-CoV-2 by FDA under an Emergency Use Authorization (EUA). This EUA will remain in effect (meaning this test can be used) for the duration of the COVID-19 declaration under Section 564(b)(1) of the Act, 21 U.S.C. section 360bbb-3(b)(1), unless the authorization is terminated or revoked.     Resp Syncytial Virus by PCR NEGATIVE NEGATIVE Final    Comment: (NOTE) Fact Sheet for Patients: EntrepreneurPulse.com.au  Fact Sheet for Healthcare Providers: IncredibleEmployment.be  This test is not yet approved or cleared by the [place] FDA and has been authorized for detection and/or diagnosis of SARS-CoV-2 by FDA under an Emergency Use Authorization (EUA). This EUA will remain in effect (meaning this test can be used) for the duration of the COVID-19 declaration under Section 564(b)(1) of the Act, 21 U.S.C. section 360bbb-3(b)(1), unless the authorization is terminated or revoked.  Performed at [facility], [place] [address], [place], [place] [address]   MRSA PCR Screening     Status: None   Collection Time: 2020-01-26 11:53 AM   Specimen: Nasopharyngeal  Result Value Ref Range Status   MRSA by PCR NEGATIVE NEGATIVE Final    Comment:        The GeneXpert MRSA Assay (FDA approved for NASAL specimens only), is one component of a comprehensive MRSA colonization surveillance program. It is not intended to diagnose MRSA infection nor to guide or monitor treatment  for MRSA infections. Performed at [facility], [place] [address], [place], [place] [address]     Lab Basic Metabolic Panel: Recent Labs  Lab 01/10/20 1008 01/10/20 1045 01/10/20 1815 2020/01/26 0239  NA 133* 131*  --  134*  K 4.2 4.1  --  4.7  CL 96*  --   --  100  CO2 12*  --   --  18*  GLUCOSE 131*  --   --  143*  BUN 48*  --   --  58*  CREATININE 2.75*  --   --  3.14*  CALCIUM 7.8*  --   --  7.7*  MG 1.7  --  1.3* 1.4*  PHOS 9.5*  --  7.5* 6.9*   Liver Function Tests: Recent Labs  Lab 01/10/20 1008 01/26/2020 0239  AST 420* 605*  ALT 149* 188*  ALKPHOS 407* 299*  BILITOT 6.7* 4.2*  PROT 5.2* 5.5*  ALBUMIN 2.4* 3.5   No results for input(s): LIPASE, AMYLASE in the last 168 hours. Recent Labs  Lab 01/10/20 1009  AMMONIA 123*   CBC: Recent Labs  Lab 01/10/20 1008 01/10/20 1009 01/10/20 1045 26-Jan-2020 0239  WBC 10.1 10.6*  --  12.8*  NEUTROABS  --   --   --  11.8*  HGB 11.9* 11.6* 12.9* 10.9*  HCT 35.7* 36.1* 38.0* 32.0*  MCV 110.2* 109.4*  --  104.2*  PLT 154 147*  --  152   Cardiac Enzymes: No results for input(s): CKTOTAL, CKMB, CKMBINDEX, TROPONINI in the last 168 hours. Sepsis Labs: Recent Labs  Lab 01/10/20 1008 01/10/20 1009 01/10/20 1815 January 26, 2020 0239 01-26-20 0823 January 26, 2020 0944  WBC 10.1 10.6*  --  12.8*  --   --   LATICACIDVEN 10.3*  --  6.6*  --  2.0* 1.7    Procedures/Operations  ETT     L  01/17/2020, 7:30 AM
